# Patient Record
Sex: Female | Born: 1945 | Race: Black or African American | Hispanic: No | Marital: Married | Smoking: Never smoker
Health system: Southern US, Community
[De-identification: ages and names within clinical notes are randomized; demographics above are authoritative.]

## PROBLEM LIST (undated history)

## (undated) DIAGNOSIS — N281 Cyst of kidney, acquired: Secondary | ICD-10-CM

## (undated) DIAGNOSIS — D72819 Decreased white blood cell count, unspecified: Secondary | ICD-10-CM

## (undated) DIAGNOSIS — B009 Herpesviral infection, unspecified: Secondary | ICD-10-CM

## (undated) DIAGNOSIS — N952 Postmenopausal atrophic vaginitis: Secondary | ICD-10-CM

## (undated) DIAGNOSIS — B379 Candidiasis, unspecified: Secondary | ICD-10-CM

## (undated) DIAGNOSIS — Z87898 Personal history of other specified conditions: Secondary | ICD-10-CM

## (undated) DIAGNOSIS — H3530 Unspecified macular degeneration: Secondary | ICD-10-CM

## (undated) DIAGNOSIS — N951 Menopausal and female climacteric states: Secondary | ICD-10-CM

## (undated) DIAGNOSIS — M75100 Unspecified rotator cuff tear or rupture of unspecified shoulder, not specified as traumatic: Secondary | ICD-10-CM

## (undated) DIAGNOSIS — Z8619 Personal history of other infectious and parasitic diseases: Secondary | ICD-10-CM

## (undated) DIAGNOSIS — L309 Dermatitis, unspecified: Secondary | ICD-10-CM

## (undated) DIAGNOSIS — D1803 Hemangioma of intra-abdominal structures: Secondary | ICD-10-CM

## (undated) DIAGNOSIS — A0472 Enterocolitis due to Clostridium difficile, not specified as recurrent: Secondary | ICD-10-CM

## (undated) DIAGNOSIS — Z8739 Personal history of other diseases of the musculoskeletal system and connective tissue: Secondary | ICD-10-CM

## (undated) HISTORY — DX: Enterocolitis due to Clostridium difficile, not specified as recurrent: A04.72

## (undated) HISTORY — DX: Candidiasis, unspecified: B37.9

## (undated) HISTORY — DX: Cyst of kidney, acquired: N28.1

## (undated) HISTORY — DX: Personal history of other infectious and parasitic diseases: Z86.19

## (undated) HISTORY — DX: Dermatitis, unspecified: L30.9

## (undated) HISTORY — DX: Postmenopausal atrophic vaginitis: N95.2

## (undated) HISTORY — DX: Unspecified rotator cuff tear or rupture of unspecified shoulder, not specified as traumatic: M75.100

## (undated) HISTORY — DX: Personal history of other diseases of the musculoskeletal system and connective tissue: Z87.39

## (undated) HISTORY — DX: Unspecified macular degeneration: H35.30

## (undated) HISTORY — DX: Menopausal and female climacteric states: N95.1

## (undated) HISTORY — DX: Herpesviral infection, unspecified: B00.9

## (undated) HISTORY — DX: Decreased white blood cell count, unspecified: D72.819

## (undated) HISTORY — DX: Hemangioma of intra-abdominal structures: D18.03

## (undated) HISTORY — DX: Personal history of other specified conditions: Z87.898

## (undated) HISTORY — PX: WISDOM TOOTH EXTRACTION: SHX21

---

## 2003-09-28 ENCOUNTER — Other Ambulatory Visit: Admission: RE | Admit: 2003-09-28 | Discharge: 2003-09-28 | Payer: Self-pay | Admitting: Obstetrics and Gynecology

## 2003-11-07 ENCOUNTER — Ambulatory Visit (HOSPITAL_COMMUNITY): Admission: RE | Admit: 2003-11-07 | Discharge: 2003-11-07 | Payer: Self-pay | Admitting: Obstetrics and Gynecology

## 2004-01-09 DIAGNOSIS — Z8739 Personal history of other diseases of the musculoskeletal system and connective tissue: Secondary | ICD-10-CM

## 2004-01-09 HISTORY — DX: Personal history of other diseases of the musculoskeletal system and connective tissue: Z87.39

## 2004-07-09 ENCOUNTER — Ambulatory Visit: Payer: Self-pay | Admitting: Internal Medicine

## 2004-09-25 ENCOUNTER — Emergency Department (HOSPITAL_COMMUNITY): Admission: EM | Admit: 2004-09-25 | Discharge: 2004-09-25 | Payer: Self-pay | Admitting: Emergency Medicine

## 2004-09-27 ENCOUNTER — Ambulatory Visit: Payer: Self-pay | Admitting: Cardiovascular Disease

## 2004-09-27 ENCOUNTER — Ambulatory Visit: Payer: Self-pay | Admitting: Endocrinology

## 2004-10-03 ENCOUNTER — Ambulatory Visit: Payer: Self-pay | Admitting: Internal Medicine

## 2004-11-07 ENCOUNTER — Other Ambulatory Visit: Admission: RE | Admit: 2004-11-07 | Discharge: 2004-11-07 | Payer: Self-pay | Admitting: Obstetrics and Gynecology

## 2004-11-13 ENCOUNTER — Encounter: Admission: RE | Admit: 2004-11-13 | Discharge: 2004-11-13 | Payer: Self-pay | Admitting: Obstetrics and Gynecology

## 2004-12-03 ENCOUNTER — Ambulatory Visit: Payer: Self-pay | Admitting: Internal Medicine

## 2004-12-24 ENCOUNTER — Ambulatory Visit: Payer: Self-pay | Admitting: Internal Medicine

## 2004-12-31 ENCOUNTER — Ambulatory Visit: Payer: Self-pay | Admitting: Internal Medicine

## 2005-02-10 ENCOUNTER — Ambulatory Visit: Payer: Self-pay | Admitting: Internal Medicine

## 2005-11-04 ENCOUNTER — Ambulatory Visit: Payer: Self-pay | Admitting: Internal Medicine

## 2005-11-14 ENCOUNTER — Ambulatory Visit: Payer: Self-pay | Admitting: Internal Medicine

## 2005-11-14 ENCOUNTER — Ambulatory Visit: Payer: Self-pay | Admitting: Family Medicine

## 2005-11-17 ENCOUNTER — Encounter: Admission: RE | Admit: 2005-11-17 | Discharge: 2005-11-17 | Payer: Self-pay | Admitting: Obstetrics and Gynecology

## 2005-11-21 ENCOUNTER — Encounter: Admission: RE | Admit: 2005-11-21 | Discharge: 2005-11-21 | Payer: Self-pay | Admitting: Obstetrics and Gynecology

## 2005-12-10 ENCOUNTER — Other Ambulatory Visit: Admission: RE | Admit: 2005-12-10 | Discharge: 2005-12-10 | Payer: Self-pay | Admitting: Obstetrics and Gynecology

## 2006-04-20 ENCOUNTER — Ambulatory Visit: Payer: Self-pay | Admitting: Internal Medicine

## 2006-05-12 DIAGNOSIS — Z8739 Personal history of other diseases of the musculoskeletal system and connective tissue: Secondary | ICD-10-CM

## 2006-05-12 HISTORY — DX: Personal history of other diseases of the musculoskeletal system and connective tissue: Z87.39

## 2006-05-21 ENCOUNTER — Encounter: Admission: RE | Admit: 2006-05-21 | Discharge: 2006-05-21 | Payer: Self-pay | Admitting: Obstetrics and Gynecology

## 2006-12-02 ENCOUNTER — Encounter: Admission: RE | Admit: 2006-12-02 | Discharge: 2006-12-02 | Payer: Self-pay | Admitting: Internal Medicine

## 2006-12-31 ENCOUNTER — Ambulatory Visit: Payer: Self-pay | Admitting: Internal Medicine

## 2006-12-31 LAB — CONVERTED CEMR LAB
ALT: 20 units/L (ref 0–35)
Alkaline Phosphatase: 38 units/L — ABNORMAL LOW (ref 39–117)
BUN: 15 mg/dL (ref 6–23)
Basophils Relative: 0.8 % (ref 0.0–1.0)
Bilirubin, Direct: 0.1 mg/dL (ref 0.0–0.3)
CO2: 31 meq/L (ref 19–32)
Creatinine, Ser: 0.9 mg/dL (ref 0.4–1.2)
Eosinophils Relative: 1.5 % (ref 0.0–5.0)
GFR calc Af Amer: 82 mL/min
Glucose, Bld: 94 mg/dL (ref 70–99)
HCT: 39.5 % (ref 36.0–46.0)
HDL: 85.4 mg/dL (ref >39.0)
Hemoglobin: 13.7 g/dL (ref 12.0–15.0)
Leukocytes, UA: NEGATIVE
Lymphocytes Relative: 23.5 % (ref 12.0–46.0)
Monocytes Absolute: 0.3 10*3/uL (ref 0.2–0.7)
Neutro Abs: 2.4 10*3/uL (ref 1.4–7.7)
Nitrite: NEGATIVE
Potassium: 3.7 meq/L (ref 3.5–5.1)
RDW: 12.3 % (ref 11.5–14.6)
Specific Gravity, Urine: 1.025 (ref 1.000–1.03)
TSH: 1.1 microintl units/mL (ref 0.35–5.50)
Total Bilirubin: 1.1 mg/dL (ref 0.3–1.2)
Total Protein, Urine: NEGATIVE mg/dL
Total Protein: 6.7 g/dL (ref 6.0–8.3)
Urobilinogen, UA: 0.2 (ref 0.0–1.0)
VLDL: 4 mg/dL (ref 0–40)
WBC: 3.6 10*3/uL — ABNORMAL LOW (ref 4.5–10.5)
pH: 6 (ref 5.0–8.0)

## 2007-01-04 ENCOUNTER — Ambulatory Visit: Payer: Self-pay | Admitting: Internal Medicine

## 2007-01-25 ENCOUNTER — Ambulatory Visit: Payer: Self-pay | Admitting: Internal Medicine

## 2007-01-25 ENCOUNTER — Encounter: Payer: Self-pay | Admitting: Internal Medicine

## 2007-01-25 DIAGNOSIS — B009 Herpesviral infection, unspecified: Secondary | ICD-10-CM | POA: Insufficient documentation

## 2007-05-11 ENCOUNTER — Ambulatory Visit: Payer: Self-pay | Admitting: Internal Medicine

## 2007-05-11 DIAGNOSIS — R5381 Other malaise: Secondary | ICD-10-CM

## 2007-05-11 DIAGNOSIS — R5383 Other fatigue: Secondary | ICD-10-CM

## 2007-05-12 DIAGNOSIS — J309 Allergic rhinitis, unspecified: Secondary | ICD-10-CM | POA: Insufficient documentation

## 2007-06-21 ENCOUNTER — Encounter: Admission: RE | Admit: 2007-06-21 | Discharge: 2007-06-21 | Payer: Self-pay | Admitting: Obstetrics and Gynecology

## 2007-12-03 ENCOUNTER — Encounter: Payer: Self-pay | Admitting: Internal Medicine

## 2007-12-03 ENCOUNTER — Encounter: Admission: RE | Admit: 2007-12-03 | Discharge: 2007-12-03 | Payer: Self-pay | Admitting: Obstetrics and Gynecology

## 2007-12-22 ENCOUNTER — Encounter: Payer: Self-pay | Admitting: Internal Medicine

## 2007-12-22 ENCOUNTER — Ambulatory Visit: Payer: Self-pay | Admitting: Internal Medicine

## 2008-02-02 ENCOUNTER — Ambulatory Visit: Payer: Self-pay | Admitting: Internal Medicine

## 2008-02-02 LAB — CONVERTED CEMR LAB
ALT: 21 units/L (ref 0–35)
AST: 26 units/L (ref 0–37)
BUN: 16 mg/dL (ref 6–23)
Basophils Absolute: 0 10*3/uL (ref 0.0–0.1)
Basophils Relative: 0.4 % (ref 0.0–3.0)
CO2: 31 meq/L (ref 19–32)
Chloride: 112 meq/L (ref 96–112)
Cholesterol: 207 mg/dL (ref 0–200)
Creatinine, Ser: 0.9 mg/dL (ref 0.4–1.2)
Direct LDL: 109.6 mg/dL
Eosinophils Relative: 2.1 % (ref 0.0–5.0)
Glucose, Bld: 84 mg/dL (ref 70–99)
Hemoglobin: 13.3 g/dL (ref 12.0–15.0)
Leukocytes, UA: NEGATIVE
Lymphocytes Relative: 21.7 % (ref 12.0–46.0)
Monocytes Relative: 7.1 % (ref 3.0–12.0)
Neutro Abs: 2.6 10*3/uL (ref 1.4–7.7)
Neutrophils Relative %: 68.7 % (ref 43.0–77.0)
RBC: 4.09 M/uL (ref 3.87–5.11)
Specific Gravity, Urine: 1.025 (ref 1.000–1.03)
Total Bilirubin: 1.2 mg/dL (ref 0.3–1.2)
Total CHOL/HDL Ratio: 2.5
Total Protein: 6.8 g/dL (ref 6.0–8.3)
Urobilinogen, UA: 0.2 (ref 0.0–1.0)
VLDL: 5 mg/dL (ref 0–40)
WBC: 3.8 10*3/uL — ABNORMAL LOW (ref 4.5–10.5)
pH: 5.5 (ref 5.0–8.0)

## 2008-02-10 ENCOUNTER — Ambulatory Visit: Payer: Self-pay | Admitting: Internal Medicine

## 2008-02-10 DIAGNOSIS — R209 Unspecified disturbances of skin sensation: Secondary | ICD-10-CM | POA: Insufficient documentation

## 2008-03-15 ENCOUNTER — Telehealth: Payer: Self-pay | Admitting: Internal Medicine

## 2008-03-16 ENCOUNTER — Ambulatory Visit: Payer: Self-pay | Admitting: Internal Medicine

## 2008-03-16 DIAGNOSIS — M549 Dorsalgia, unspecified: Secondary | ICD-10-CM | POA: Insufficient documentation

## 2008-06-05 ENCOUNTER — Ambulatory Visit: Payer: Self-pay | Admitting: Internal Medicine

## 2008-06-05 ENCOUNTER — Telehealth (INDEPENDENT_AMBULATORY_CARE_PROVIDER_SITE_OTHER): Payer: Self-pay | Admitting: *Deleted

## 2008-08-23 ENCOUNTER — Telehealth (INDEPENDENT_AMBULATORY_CARE_PROVIDER_SITE_OTHER): Payer: Self-pay | Admitting: *Deleted

## 2008-08-25 ENCOUNTER — Ambulatory Visit: Payer: Self-pay | Admitting: Internal Medicine

## 2008-08-25 DIAGNOSIS — M533 Sacrococcygeal disorders, not elsewhere classified: Secondary | ICD-10-CM

## 2008-08-28 ENCOUNTER — Ambulatory Visit: Payer: Self-pay | Admitting: Internal Medicine

## 2008-08-29 ENCOUNTER — Telehealth: Payer: Self-pay | Admitting: Internal Medicine

## 2008-12-04 ENCOUNTER — Encounter: Admission: RE | Admit: 2008-12-04 | Discharge: 2008-12-04 | Payer: Self-pay | Admitting: Internal Medicine

## 2008-12-22 ENCOUNTER — Ambulatory Visit: Payer: Self-pay | Admitting: Internal Medicine

## 2009-01-11 LAB — CONVERTED CEMR LAB: Pap Smear: NORMAL

## 2009-01-29 ENCOUNTER — Telehealth (INDEPENDENT_AMBULATORY_CARE_PROVIDER_SITE_OTHER): Payer: Self-pay | Admitting: *Deleted

## 2009-02-09 ENCOUNTER — Ambulatory Visit: Payer: Self-pay | Admitting: Internal Medicine

## 2009-02-09 LAB — CONVERTED CEMR LAB
AST: 23 units/L (ref 0–37)
Albumin: 4.2 g/dL (ref 3.5–5.2)
Alkaline Phosphatase: 32 units/L — ABNORMAL LOW (ref 39–117)
BUN: 18 mg/dL (ref 6–23)
Basophils Relative: 0.6 % (ref 0.0–3.0)
Bilirubin Urine: NEGATIVE
Creatinine, Ser: 0.9 mg/dL (ref 0.4–1.2)
GFR calc non Af Amer: 80.95 mL/min (ref >60)
Glucose, Bld: 87 mg/dL (ref 70–99)
HCT: 37.9 % (ref 36.0–46.0)
Hemoglobin, Urine: NEGATIVE
Hemoglobin: 12.9 g/dL (ref 12.0–15.0)
Ketones, ur: NEGATIVE mg/dL
Lymphocytes Relative: 24.1 % (ref 12.0–46.0)
MCHC: 34 g/dL (ref 30.0–36.0)
Monocytes Relative: 6.7 % (ref 3.0–12.0)
Neutro Abs: 2.4 10*3/uL (ref 1.4–7.7)
Potassium: 3.7 meq/L (ref 3.5–5.1)
RBC: 4.03 M/uL (ref 3.87–5.11)
Total CHOL/HDL Ratio: 3
Total Protein: 6.6 g/dL (ref 6.0–8.3)
Urine Glucose: NEGATIVE mg/dL
Urobilinogen, UA: 0.2 (ref 0.0–1.0)
VLDL: 4.4 mg/dL (ref 0.0–40.0)

## 2009-02-15 ENCOUNTER — Ambulatory Visit: Payer: Self-pay | Admitting: Internal Medicine

## 2009-02-15 DIAGNOSIS — M25519 Pain in unspecified shoulder: Secondary | ICD-10-CM

## 2009-02-16 ENCOUNTER — Telehealth: Payer: Self-pay | Admitting: Internal Medicine

## 2009-02-26 ENCOUNTER — Telehealth (INDEPENDENT_AMBULATORY_CARE_PROVIDER_SITE_OTHER): Payer: Self-pay | Admitting: *Deleted

## 2009-03-02 ENCOUNTER — Encounter: Admission: RE | Admit: 2009-03-02 | Discharge: 2009-03-02 | Payer: Self-pay | Admitting: Internal Medicine

## 2009-03-05 ENCOUNTER — Encounter: Payer: Self-pay | Admitting: Internal Medicine

## 2009-04-25 ENCOUNTER — Telehealth: Payer: Self-pay | Admitting: Internal Medicine

## 2009-07-16 ENCOUNTER — Encounter: Payer: Self-pay | Admitting: Internal Medicine

## 2009-08-10 HISTORY — PX: ROTATOR CUFF REPAIR: SHX139

## 2009-08-21 ENCOUNTER — Encounter: Payer: Self-pay | Admitting: Internal Medicine

## 2009-09-25 ENCOUNTER — Ambulatory Visit: Payer: Self-pay | Admitting: Internal Medicine

## 2009-10-24 ENCOUNTER — Telehealth: Payer: Self-pay | Admitting: Internal Medicine

## 2009-11-20 ENCOUNTER — Ambulatory Visit: Payer: Self-pay | Admitting: Internal Medicine

## 2009-11-29 ENCOUNTER — Encounter: Payer: Self-pay | Admitting: Internal Medicine

## 2009-11-29 DIAGNOSIS — I739 Peripheral vascular disease, unspecified: Secondary | ICD-10-CM

## 2009-11-30 ENCOUNTER — Encounter: Payer: Self-pay | Admitting: Internal Medicine

## 2009-11-30 ENCOUNTER — Ambulatory Visit: Payer: Self-pay

## 2009-12-06 ENCOUNTER — Encounter: Admission: RE | Admit: 2009-12-06 | Discharge: 2009-12-06 | Payer: Self-pay | Admitting: Internal Medicine

## 2009-12-06 LAB — HM MAMMOGRAPHY: HM Mammogram: NEGATIVE

## 2010-01-08 ENCOUNTER — Encounter: Payer: Self-pay | Admitting: Internal Medicine

## 2010-01-09 ENCOUNTER — Telehealth: Payer: Self-pay | Admitting: Internal Medicine

## 2010-01-10 ENCOUNTER — Ambulatory Visit: Payer: Self-pay | Admitting: Internal Medicine

## 2010-01-10 DIAGNOSIS — Z8669 Personal history of other diseases of the nervous system and sense organs: Secondary | ICD-10-CM | POA: Insufficient documentation

## 2010-02-09 DIAGNOSIS — H3530 Unspecified macular degeneration: Secondary | ICD-10-CM

## 2010-02-09 HISTORY — DX: Unspecified macular degeneration: H35.30

## 2010-02-19 ENCOUNTER — Encounter: Payer: Self-pay | Admitting: Internal Medicine

## 2010-02-19 ENCOUNTER — Ambulatory Visit: Payer: Self-pay | Admitting: Internal Medicine

## 2010-02-19 DIAGNOSIS — M810 Age-related osteoporosis without current pathological fracture: Secondary | ICD-10-CM | POA: Insufficient documentation

## 2010-02-19 LAB — CONVERTED CEMR LAB
Alkaline Phosphatase: 48 units/L (ref 39–117)
Basophils Absolute: 0 10*3/uL (ref 0.0–0.1)
Bilirubin, Direct: 0.1 mg/dL (ref 0.0–0.3)
Calcium: 9.5 mg/dL (ref 8.4–10.5)
Chloride: 103 meq/L (ref 96–112)
Creatinine, Ser: 0.7 mg/dL (ref 0.4–1.2)
Direct LDL: 125.2 mg/dL
Eosinophils Absolute: 0.1 10*3/uL (ref 0.0–0.7)
Folate: 17.3 ng/mL
GFR calc non Af Amer: 111.51 mL/min (ref >60)
Hemoglobin: 13.8 g/dL (ref 12.0–15.0)
Hgb A1c MFr Bld: 5.6 % (ref 4.6–6.5)
Lymphocytes Relative: 21.7 % (ref 12.0–46.0)
Lymphs Abs: 0.8 10*3/uL (ref 0.7–4.0)
MCHC: 34.2 g/dL (ref 30.0–36.0)
Monocytes Absolute: 0.2 10*3/uL (ref 0.1–1.0)
Neutro Abs: 2.5 10*3/uL (ref 1.4–7.7)
RDW: 13.3 % (ref 11.5–14.6)
Saturation Ratios: 17.6 % — ABNORMAL LOW (ref 20.0–50.0)
Total Protein: 6.9 g/dL (ref 6.0–8.3)
Transferrin: 296 mg/dL (ref 212.0–360.0)
VLDL: 5.6 mg/dL (ref 0.0–40.0)
Vitamin B-12: 1328 pg/mL — ABNORMAL HIGH (ref 211–911)

## 2010-02-21 ENCOUNTER — Ambulatory Visit: Payer: Self-pay | Admitting: Internal Medicine

## 2010-02-21 ENCOUNTER — Telehealth: Payer: Self-pay | Admitting: Internal Medicine

## 2010-02-21 ENCOUNTER — Encounter: Payer: Self-pay | Admitting: Internal Medicine

## 2010-02-25 ENCOUNTER — Ambulatory Visit (HOSPITAL_COMMUNITY)
Admission: RE | Admit: 2010-02-25 | Discharge: 2010-02-25 | Payer: Self-pay | Source: Home / Self Care | Admitting: Internal Medicine

## 2010-02-28 ENCOUNTER — Encounter: Payer: Self-pay | Admitting: Internal Medicine

## 2010-03-04 ENCOUNTER — Telehealth: Payer: Self-pay | Admitting: Internal Medicine

## 2010-03-11 ENCOUNTER — Encounter: Payer: Self-pay | Admitting: Internal Medicine

## 2010-03-18 ENCOUNTER — Encounter: Payer: Self-pay | Admitting: Internal Medicine

## 2010-03-28 ENCOUNTER — Telehealth: Payer: Self-pay | Admitting: Internal Medicine

## 2010-05-31 ENCOUNTER — Encounter: Payer: Self-pay | Admitting: Internal Medicine

## 2010-06-02 ENCOUNTER — Encounter: Payer: Self-pay | Admitting: Obstetrics and Gynecology

## 2010-06-02 ENCOUNTER — Encounter: Payer: Self-pay | Admitting: Internal Medicine

## 2010-06-13 NOTE — Assessment & Plan Note (Signed)
Summary: feels off balance / SD   Vital Signs:  Patient profile:   65 year old female Height:      66 inches Weight:      134 pounds BMI:     21.71 O2 Sat:      99 % on Room air Temp:     97.9 degrees F oral Pulse rate:   69 / minute BP sitting:   124 / 72  (right arm) Cuff size:   regular  Vitals Entered By: Bill Salinas CMA (Sep 25, 2009 2:04 PM)  O2 Flow:  Room air CC: pt here with c/o feeling off balence since yesterday, she is currently on oxycodone prn from a rotator cuff repair back in april / ab   Primary Care Provider:  Len Azeez  CC:  pt here with c/o feeling off balence since yesterday and she is currently on oxycodone prn from a rotator cuff repair back in april / ab.  History of Present Illness: Still recovering from left shoulder surgery and is in rehab. She is still having pain. She does take aleve and oxycodone/APAP prior to PT. Yesterday after PT she was unsteady on her feet. She had taken a doses of oxycodone prior to PT. She continued to be dizzy into the evening. This AM she was still not feeling normal/dizzy but not ataxic. 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 had recurrent ataxia and dizziness. Last week she took oxycodone at higher dose without dysequilibrium   Current Medications (verified): 1)  Alendronate Sodium 70 Mg  Tabs (Alendronate Sodium) .... One Tablet By Mouth On The Same Day Weekly W/  A Full Glass of Water. 2)  Activella .... Take One Tablet By Mouth Every Other Day 3)  Kyolic .... 2 Two Times A Day 4)  Oscal 500/200 D-3 500-200 Mg-Unit  Tabs (Calcium-Vitamin D) .... Take 1 Tablet By Mouth Two Times A Day 5)  Multivitamins   Tabs (Multiple Vitamin) .... Take 1 Tablet By Mouth Once A Day 6)  Hydrocortisone Valerate 0.2 % Crea (Hydrocortisone Valerate) .... Use As Needed For Facial Rash. 7)  Aleve 220 Mg Tabs (Naproxen Sodium) .[name].. 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    Physical Exam  General:  overweight AA female in no distress Head:  Normocephalic and atraumatic without obvious abnormalities. No apparent alopecia or balding. Lungs:  normal respiratory effort and normal breath sounds.   Heart:  normal rate and regular rhythm.   Msk:  decreased ROM left shoulder Neurologic:  alert & oriented X3, cranial nerves II-XII intact, strength normal in all extremities, gait normal, DTRs symmetrical and normal, and finger-to-nose normal, no dysdiadochokinesia. Skin:  turgor normal and color normal.   Psych:  Oriented X3, normally interactive, and good eye contact.     Impression & Recommendations:  Problem # 1:  LABYRINTHITIS, ACUTE (ICD-386.30) patient with dysequilibrium. Her neuro exam is normal mitigating against CNS event.  Plan - education and reasurance           otc bonine 1/2 or 1 by mouth  q 6 as needed dizziness.  Problem # 2:  SHOULDER PAIN, LEFT (ICD-719.41) s/p rotator cuff repair   Plan - continue PT and follow-up with Dr. [name]  Her updated medication list for this problem includes:    Aleve 220 Mg Tabs (Naproxen sodium) .[name]... 1 to 2 as needed  Complete Medication List: 1)  Alendronate Sodium 70 Mg Tabs (Alendronate sodium) .... One tablet by mouth on the same day weekly w/  a full glass of water. 2)  Activella  .... Take one tablet by mouth every other day 3)  Kyolic  .... 2 two times a day 4)  Oscal 500/200 D-3 500-200 Mg-unit Tabs (Calcium-vitamin d) .... Take 1 tablet by mouth two  times a day 5)  Multivitamins Tabs (Multiple vitamin) .... Take 1 tablet by mouth once a day 6)  Hydrocortisone Valerate 0.2 % Crea (Hydrocortisone valerate) .... Use as needed for facial rash. 7)  Aleve 220 Mg Tabs (Naproxen sodium) .[name].. 1 to 2 as needed

## 2010-06-13 NOTE — Letter (Signed)
   [facility] [address] [place], [place]  [address] Phone: [phone]      February 28, 2010   [facility] [place] [address] [place], [place] [address]  RE:  LAB RESULTS  Dear  Ms. NEUBERGER,  The following is an interpretation of your most recent lab tests.  Please take note of any instructions provided or changes to medications that have resulted from your lab work.    MRI/MRA of brain negative for any tumor or vascular disease that could cause dysequilibrium.  Working diagnosis remains labyrinthitis.   Please come see me if you have any questions about these lab results.   Sincerely Yours,    Jacques Navy MD  R BRAIN W/O CM [phone]   Clinical Data:  Gait disturbance.  Labyrinthitis.   MRI HEAD WITHOUT CONTRAST MRA HEAD WITHOUT CONTRAST   Technique:  Multiplanar, multiecho pulse sequences of the brain and surrounding structures were obtained without intravenous contrast. Angiographic images of the head were obtained using MRA technique without contrast.   Comparison:  CT 09/27/2004   MRI HEAD   Findings:  There are numerous small subcortical and deep white matter hyperintensities bilaterally.  Brainstem is normal.  Small hyperintensities in the cerebellar white matter bilaterally. Diffusion weighted imaging is negative for acute infarct.   Negative for hydrocephalus.  Negative for Chiari malformation. Pituitary gland is normal in size.   Negative for intracranial mass.  There is no hemorrhage or fluid collection.   Labyrinthitis would not be typically detected on unenhanced MRI. It  may be detected with intravenous contrast.  No mastoid sinus effusion or lesion is identified.   IMPRESSION: Moderate changes throughout the cerebral and cerebellar white matter bilaterally.  This is most likely due to chronic microvascular ischemia.  Vasculitis, migraine headaches, and demyelinating disease are also possible.  No acute  infarct or mass.   MRA HEAD   Findings: Both vertebral arteries are patent to the basilar.  PICA, AICA, superior cerebellar, and posterior cerebral arteries are widely patent without stenosis.   Internal carotid artery is widely patent.  Anterior and middle cerebral arteries are patent bilaterally without stenosis.   Negative for cerebral aneurysm.   IMPRESSION: Negative   Read By:  Camelia Phenes,  Judie Petit.D.

## 2010-06-13 NOTE — Progress Notes (Signed)
Summary: CALL FROM MD  Phone Note Call from Patient Call back at Home Phone [phone] Call back at or [phone]   Summary of Call: Patient is requesting a call back from Dr Debby Bud to discuss the MRI.  Initial call taken by: Lamar Sprinkles, CMA,  March 04, 2010 4:33 PM  Follow-up for Phone Call        called pt: MRI with chronic small vessel dz but otherwise normal. MRA normal. She is still having intermittent dysequilibrium. Will refer to neurology. [facility] notified.  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 Follow-up by: Jacques Navy MD,  March 04, 2010 5:50 PM

## 2010-06-13 NOTE — Progress Notes (Signed)
Summary: REQ A CALL FROM MD  Phone Note Call from Patient Call back at Home Phone [phone]   Summary of Call: Patient is requesting a call from Dr Debby Bud. She wants MD to go over her labs. Pt did recieve the ov notes but does not understand some of the information.  She would not give me any specific questions.   - This is the first time she has had to have labs after her office visit. Initial call taken by: Lamar Sprinkles, CMA,  February 21, 2010 4:11 PM  Follow-up for Phone Call        called patient reviewed labs amd note Follow-up by: Jacques Navy MD,  February 21, 2010 6:19 PM

## 2010-06-13 NOTE — Miscellaneous (Signed)
Summary: BONE DENSITY  Clinical Lists Changes  Orders: Added new Test order of T-Bone Densitometry (77080) - Signed Added new Test order of T-Lumbar Vertebral Assessment (77082) - Signed 

## 2010-06-13 NOTE — Miscellaneous (Signed)
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 

## 2010-06-13 NOTE — Progress Notes (Signed)
  Phone Note Outgoing Call   Reason for Call: Discuss lab or test results Summary of Call: please call patient - her Vit d is 33 - very very good.  Thanks Initial call taken by: Jacques Navy MD,  March 28, 2010 8:16 AM  Follow-up for Phone Call        informed pt and will fax copy to Dr Pennie Rushing Follow-up by: Ami Bullins CMA,  March 28, 2010 9:49 AM

## 2010-06-13 NOTE — Progress Notes (Signed)
Summary: REQ FOR A CALL  Phone Note Call from Patient   Summary of Call: Pt has recurrence of "equilibrium" problem that she was seen for at last office visit. She says it is not as severe this time. Patient is requesting a call from MD. Would you like office visit?  Initial call taken by: Lamar Sprinkles, CMA,  January 09, 2010 1:34 PM  Follow-up for Phone Call        yes, OV for reevaluation Follow-up by: Jacques Navy MD,  January 09, 2010 1:39 PM  Additional Follow-up for Phone Call Additional follow up Details #1::        Pt scheduled for office visit tomorrow am for eval Additional Follow-up by: Lamar Sprinkles, CMA,  January 09, 2010 1:52 PM

## 2010-06-13 NOTE — Letter (Signed)
Summary: [facility]  [facility]   Imported By: Sherian Rein 06/07/2010 09:25:23  _____________________________________________________________________  External Attachment:    Type:   Image     Comment:   External Document

## 2010-06-13 NOTE — Assessment & Plan Note (Signed)
Summary: cpx-lb   Vital Signs:  Patient profile:   65 year old female Height:      66 inches (167.64 cm) Weight:      135.50 pounds (61.59 kg) BMI:     21.95 O2 Sat:      99 % on Room air Temp:     97.5 degrees F (36.39 degrees C) oral Pulse rate:   78 / minute BP sitting:   114 / 72  (left arm) Cuff size:   regular  Vitals Entered By: Wendy Grills MA (February 19, 2010 10:24 AM)  O2 Flow:  Room air CC: CPX/pt would like Vitamin D test per OB/GYN/aj Is Patient Diabetic? No Comments pt is no longer taking Alendronate Sodium   Primary Care Provider:  norins  CC:  CPX/pt would like Vitamin D test per OB/GYN/aj.  History of Present Illness: Patient presents for Welcome to Medicare exam. She has been doing well and has continued to make progress with her left shoulder recovery. She is current with Dr. Dierdre Forth. She is generally feeling well. She is still having some dizziness that was diagnosed as labyrinthitis. She has not had any neuro-imaging.  Preventive Screening-Counseling & Management  Alcohol-Tobacco     Alcohol drinks/day: 0     Smoking Status: never  Caffeine-Diet-Exercise     Caffeine use/day: tea in cold weather     Diet Comments: heart healthy diet     Diet Counseling: not indicated; diet is assessed to be healthy     Does Patient Exercise: yes     Type of exercise: stair climbing and shoulder exercis     Exercise (avg: min/session): <30     Times/week: 5     Exercise Counseling: to improve exercise regimen     MSH Depression Score: no depression  Hep-HIV-STD-Contraception     Hepatitis Risk: no risk noted     HIV Risk: no risk noted     STD Risk: no risk noted     Dental Visit-last 6 months yes     SBE monthly: no     Sun Exposure-Excessive: no  Safety-Violence-Falls     Seat Belt Use: yes     Firearms in the Home: no firearms in the home     Smoke Detectors: yes     Violence in the Home: no risk noted     Sexual Abuse: no     Fall Risk:  minor due to shoulder     Fall Risk Counseling: counseling provided; falls with injury noted      Sexual History:  currently monogamous.        Drug Use:  never.        Blood Transfusions:  no.    Current Medications (verified): 1)  Activella .... Take One Tablet By Mouth Every Other Day 2)  Kyolic .... 2 Two Times A Day 3)  Oscal 500/200 D-3 500-200 Mg-Unit  Tabs (Calcium-Vitamin D) .... Take 1 Tablet By Mouth Two Times A Day 4)  Multivitamins   Tabs (Multiple Vitamin) .... Take 1 Tablet By Mouth Once A Day 5)  Hydrocortisone Valerate 0.2 % Crea (Hydrocortisone Valerate) .... Use As Needed For Facial Rash.  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 Pennie Rushing              ortho - Dr. [name]              opthal - Dr. Pearlean Brownie ([facility])                Social History: [employer] Serr[employer] college BA; MEd-teaching; MEd Administration @ [employer] married '67 has 11 children who were foster care or mentored. Retired. SO retired. Has had some health problems. They are doing well. Marriage is in good health and retirement is good.  End-of-Life: provided packet of information and forms for consideration (Oct '11) Caffeine use/day:  tea in cold weather Hepatitis Risk:  no risk noted HIV Risk:  no risk noted STD Risk:  no risk noted Dental Care w/in 6 mos.:  yes Sun Exposure-Excessive:  no Seat Belt Use:  yes Fall Risk:  minor due to shoulder Sexual History:  currently monogamous Drug Use:  never Blood  Transfusions:  no  Review of Systems  The patient denies anorexia, fever, weight loss, weight gain, vision loss, decreased hearing, chest pain, dyspnea on exertion, peripheral edema, prolonged cough, headaches, abdominal pain, melena, hematochezia, severe indigestion/heartburn, hematuria, incontinence, muscle weakness, suspicious skin lesions, difficulty walking, depression, unusual weight change, abnormal bleeding, enlarged lymph nodes, angioedema, and breast masses.    Physical Exam  General:  Slender, well nourished and well groomed AA woma in no distress Head:  Normocephalic and atraumatic without obvious abnormalities. No apparent alopecia or balding. Eyes:  No corneal or conjunctival inflammation noted. EOMI. Perrla. Funduscopic exam benign, without hemorrhages, exudates or papilledema. Vision grossly normal. Ears:  External ear exam shows no significant lesions or deformities.  Otoscopic examination reveals clear canals, tympanic membranes are intact bilaterally without bulging, retraction, inflammation or discharge. Hearing is grossly normal bilaterally. Nose:  no external deformity and no external erythema.   Mouth:  Oral mucosa and oropharynx without lesions or exudates.  Teeth in good repair. Neck:  supple, full ROM, no thyromegaly, and no carotid bruits.   Chest Wall:  no deformities.   Breasts:  deferred to gyn Lungs:  Normal respiratory effort, chest expands symmetrically. Lungs are clear to auscultation, no crackles or wheezes. Heart:  Normal rate and regular rhythm. S1 and S2 normal without gallop, murmur, click, rub or other extra sounds. Abdomen:  soft, non-tender, normal bowel sounds, and no hepatomegaly.   Genitalia:  deferred to gyn Msk:  no joint tenderness, no joint swelling, no joint warmth, and no redness over joints.  ROM left shoulder not formerly evaluated Pulses:  2+ radial, posterior tibial and dorsalis pedis pulses Extremities:  No clubbing, cyanosis, edema, or  deformity noted with normal full range of motion of all joints.   Neurologic:  alert & oriented X3, cranial nerves II-XII intact, strength normal in all extremities, gait normal, and DTRs symmetrical and normal.   Skin:  turgor normal, color normal, no rashes, and no suspicious lesions.  Multiple small skin tags along necklace line Cervical Nodes:  no anterior cervical adenopathy and no posterior cervical adenopathy.   Psych:  Oriented X3, memory intact for recent and remote, normally interactive, good eye contact, and not anxious appearing.     Impression & Recommendations:  Problem # 1:  OSTEOPOROSIS (ICD-733.00) Due  for follow-up DXA scan with last study in August of '09. Will also check Vit D  The following medications were removed from the medication list:    Alendronate Sodium 70 Mg Tabs (Alendronate sodium) ..... One tablet by mouth on the same day weekly w/  a full glass of water.  Orders: T-Vitamin D (25-Hydroxy) [phone])  Addendum -   Problem # 2:  LABYRINTHITIS (ICD-386.30) Patient with persistent symptoms that are intermittent but present for many weeks.  Plan - r/o CNS abnormality, e.g. cerebellar or brainstem lacunar infarct or vertebro-basilar insufficiency, with MRI/MRA  Orders: Radiology Referral (Radiology)  Problem # 3:  SHOULDER PAIN, LEFT (ICD-719.41) Steady improvement with continuation of home exercise program.  Problem # 4:  OTHER MALAISE AND FATIGUE (ICD-780.79)  Symptoms have improved.  Plan - lab to verify absence of metabolic derrangement as cause of low energy.  Addendum- normal lab including B12, iron panel, A1C  Orders: TLB-TSH (Thyroid Stimulating Hormone) (84443-TSH) TLB-B12 + Folate Pnl ([id number]/NGE) TLB-IBC Pnl (Iron/FE;Transferrin) (83550-IBC) TLB-BMP (Basic Metabolic Panel-BMET) (80048-METABOL) TLB-A1C / Hgb A1C (Glycohemoglobin) (83036-A1C)  Complete Medication List: 1)  Activella  .... Take one tablet by mouth every  other day 2)  Kyolic  .... 2 two times a day 3)  Oscal 500/200 D-3 500-200 Mg-unit Tabs (Calcium-vitamin d) .... Take 1 tablet by mouth two times a day 4)  Multivitamins Tabs (Multiple vitamin) .... Take 1 tablet by mouth once a day 5)  Hydrocortisone Valerate 0.2 % Crea (Hydrocortisone valerate) .... Use as needed for facial rash.  Other Orders: TLB-Lipid Panel (80061-LIPID) TLB-Hepatic/Liver Function Pnl (80076-HEPATIC) TLB-CBC Platelet - w/Differential (85025-CBCD) Flu Vaccine 77yrs + MEDICARE PATIENTS (X5284) Administration Flu vaccine - MCR (X3244) Pneumococcal Vaccine (01027) Admin 1st Vaccine (25366) Welcome to Medicare, Physical (Y4034)   atient: Wendy Serrano Note: All result statuses are Final unless otherwise noted.  Tests: (1) TSH (TSH)   FastTSH                   1.34 uIU/mL                 0.35-5.50  Tests: (2) B12 + Folate Panel (B12/FOL)   Vitamin B12          [H]  1328 pg/mL                  211-911   Folate                    17.3 ng/mL     Deficient  0.4 - 3.4 ng/mL     Indeterminate  3.4 - 5.4 ng/mL     Normal  >5.4 ng/mL  Tests: (3) IBC Panel (IBC)   Iron                      73 ug/dL                    74-259   Transferrin               296.0 mg/dL                 563.8-756.4   Iron Saturation      [L]  17.6 %                      20.0-50.0  Tests: (4) Lipid Panel (LIPID)   Cholesterol          [  H]  237 mg/dL                   1-610     ATP III Classification            Desirable:  < 200 mg/dL                    Borderline High:  200 - 239 mg/dL               High:  > = 240 mg/dL   Triglycerides             28.0 mg/dL                  9.6-045.4     Normal:  <150 mg/dL     Borderline High:  098 - 199 mg/dL   HDL                       11.91 mg/dL                 >47.82   VLDL Cholesterol          5.6 mg/dL                   9.5-62.1  CHO/HDL Ratio:  CHD Risk                             3                    Men          Women     1/2 Average Risk      3.4          3.3     Average Risk          5.0          4.4     2X Average Risk          9.6          7.1     3X Average Risk          15.0          11.0                           Tests: (5) Hepatic/Liver Function Panel (HEPATIC)   Total Bilirubin           0.9 mg/dL                   3.0-8.6   Direct Bilirubin          0.1 mg/dL                   5.7-8.4   Alkaline Phosphatase      48 U/L                      39-117   AST                       21 U/L                      0-37   ALT  16 U/L                      0-35   Total Protein             6.9 g/dL                    0.4-5.4   Albumin                   4.4 g/dL                    0.9-8.1  Tests: (6) CBC Platelet w/Diff (CBCD)   White Cell Count     [L]  3.6 K/uL                    4.5-10.5   Red Cell Count            4.35 Mil/uL                 3.87-5.11   Hemoglobin                13.8 g/dL                   19.1-47.8   Hematocrit                40.4 %                      36.0-46.0   MCV                       92.8 fl                     78.0-100.0   MCHC                      34.2 g/dL                   29.5-62.1   RDW                       13.3 %                      11.5-14.6   Platelet Count            244.0 K/uL                  150.0-400.0   Neutrophil %              69.6 %                      43.0-77.0   Lymphocyte %              21.7 %                      12.0-46.0   Monocyte %                6.2 %                       3.0-12.0   Eosinophils%              1.7 %  0.0-5.0   Basophils %               0.8 %                       0.0-3.0   Neutrophill Absolute      2.5 K/uL                    1.4-7.7   Lymphocyte Absolute       0.8 K/uL                    0.7-4.0   Monocyte Absolute         0.2 K/uL                    0.1-1.0  Eosinophils, Absolute                             0.1 K/uL                    0.0-0.7   Basophils Absolute        0.0 K/uL                     0.0-0.1  Tests: (7) BMP (METABOL)   Sodium                    139 mEq/L                   135-145   Potassium                 3.9 mEq/L                   3.5-5.1   Chloride                  103 mEq/L                   96-112   Carbon Dioxide            29 mEq/L                    19-32   Glucose                   90 mg/dL                    08-65   BUN                       16 mg/dL                    7-84   Creatinine                0.7 mg/dL                   6.9-6.2   Calcium                   9.5 mg/dL                   9.5-28.4   GFR                       111.51 mL/min               >  60  Tests: (8) Hemoglobin A1C (A1C)   Hemoglobin A1C            5.6 %                       4.6-6.5     Glycemic Control Guidelines for People with Diabetes:     Non Diabetic:  <6%     Goal of Therapy: <7%     Additional Action Suggested:  >8%   Tests: (9) Cholesterol LDL - Direct (DIRLDL)  Cholesterol LDL - Direct                             125.2 mg/dL     Optimal:  <161 mg/dL     Near or Above Optimal:  100-129 mg/dL     Borderline High:  096-045 mg/dL     High:  409-811 mg/dL     Very High:  >914 mg/dL  Preventive Care Screening  Pap Smear:    Date:  01/10/2010    Results:  normal   Mammogram:    Date:  11/09/2009    Results:  normal     Flu Vaccine Consent Questions     Do you have a history of severe allergic reactions to this vaccine? no    Any prior history of allergic reactions to egg and/or gelatin? no    Do you have a sensitivity to the preservative Thimersol? no    Do you have a past history of Guillan-Barre Syndrome? no    Do you currently have an acute febrile illness? no    Have you ever had a severe reaction to latex? no    Vaccine information given and explained to patient? yes    Are you currently pregnant? no    Lot Number:[id number]   Exp Date:11/09/2010   Site Given  Left Deltoid IMlu   Immunizations Administered:  Pneumonia Vaccine:    Vaccine Type:  Pneumovax    Site: right deltoid    Mfr: Merck    Dose: 0.5 ml    Route: IM    Given by: Wendy Grills MA    Exp. Date: 07/29/2011    Lot #: [id number]    VIS given: 04/16/09 version given February 19, 2010.  Appended Document: cpx-lb Mrs. Otwell remains 100% independent in all her ADLs. She has no signs or symptoms of depression. She is very competent and manages a complex social calendar, engages in complex activities including the management of her home, her household and all the attendent aspects of the same.

## 2010-06-13 NOTE — Assessment & Plan Note (Signed)
Summary: balance concerns/SD   Vital Signs:  Patient profile:   65 year old female Height:      66 inches Weight:      134 pounds BMI:     21.71 O2 Sat:      99 % on Room air Temp:     97.9 degrees F oral Pulse rate:   81 / minute BP sitting:   110 / 68  (left arm) Cuff size:   regular  Vitals Entered By: Bill Salinas CMA (January 10, 2010 9:13 AM)  O2 Flow:  Room air CC: pt here to discuss ongoing balance issues/ ab   Primary Care Provider:  norins  CC:  pt here to discuss ongoing balance issues/ ab.  History of Present Illness: Patient present for recurrent dysequilibrium. She had similar symptoms but more severe in the past that responded to meclizine. she has had symptoms since Sunday. she has had no falls. she denies any focal neurologic symptoms.   Current Medications (verified): 1)  Alendronate Sodium 70 Mg  Tabs (Alendronate Sodium) .... One Tablet By Mouth On The Same Day Weekly W/  A Full Glass of Water. 2)  Activella .... Take One Tablet By Mouth Every Other Day 3)  Kyolic .... 2 Two Times A Day 4)  Oscal 500/200 D-3 500-200 Mg-Unit  Tabs (Calcium-Vitamin D) .... Take 1 Tablet By Mouth Two Times A Day 5)  Multivitamins   Tabs (Multiple Vitamin) .... Take 1 Tablet By Mouth Once A Day 6)  Hydrocortisone Valerate 0.2 % Crea (Hydrocortisone Valerate) .... Use As Needed For Facial Rash.  Allergies (verified): No Known Drug Allergies PMH-FH-SH reviewed-no changes except otherwise noted  Review of Systems  The patient denies anorexia, fever, weight loss, syncope, dyspnea on exertion, headaches, muscle weakness, difficulty walking, and enlarged lymph nodes.    Physical Exam  General:  WNWD nicely groomed AA female in no distress Head:  normocephalic and atraumatic.   Eyes:  vision grossly intact, pupils equal, and pupils round.   Lungs:  normal respiratory effort.   Heart:  normal rate and regular rhythm.   Neurologic:  alert & oriented X3, cranial nerves II-XII  intact, strength normal in all extremities, and gait normal, tandem gait normal, nl rapid alternating finger movement, no dysdiadochokinesia, nl finger- to nose. Skin:  turgor normal and color normal.   Psych:  Oriented X3 and memory intact for recent and remote.     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 Reviewed mechanism of disease with use of anatomy text.  Plan - meclizine as needed.   Problem # 2:  Preventive Health Care (ICD-V70.0) Discussed immunizations: she has had shingles - 15 years ago. reviewed CDC recommendation - no clear evidence of efficacy but no harm either with immunization; pneumonia vaccine is efficacious and usefull; flu shots are efficacious. Will wait and consider immunizations at annual exam in October.   Complete Medication List: 1)  Alendronate Sodium 70 Mg Tabs (Alendronate sodium) .... One tablet by mouth on the same day weekly w/  a full glass of water. 2)  Activella  .... Take one tablet by mouth every other day 3)  Kyolic  .... 2 two times a day 4)  Oscal 500/200 D-3 500-200 Mg-unit Tabs (Calcium-vitamin d) .... Take 1 tablet by mouth two times a day 5)  Multivitamins Tabs (Multiple vitamin) .... Take 1 tablet by mouth once a day 6)  Hydrocortisone Valerate 0.2 % Crea (Hydrocortisone valerate) .... Use as needed for facial rash.

## 2010-06-13 NOTE — Letter (Signed)
Summary: [facility]   Imported By: Sherian Rein 08/11/2009 09:20:15  _____________________________________________________________________  External Attachment:    Type:   Image     Comment:   External Document

## 2010-06-13 NOTE — Letter (Signed)
Summary: [facility]   Imported By: Sherian Rein 01/15/2010 15:12:37  _____________________________________________________________________  External Attachment:    Type:   Image     Comment:   External Document

## 2010-06-13 NOTE — Assessment & Plan Note (Signed)
Summary: STILL HAVING BALANCE PROBLEMS/NWS  #   Vital Signs:  Patient profile:   65 year old female Height:      66 inches Weight:      135 pounds BMI:     21.87 O2 Sat:      98 % on Room air Temp:     98.6 degrees F oral Pulse rate:   87 / minute BP sitting:   118 / 72  (left arm) Cuff size:   regular  Vitals Entered By: Bill Salinas CMA (November 20, 2009 3:12 PM)  O2 Flow:  Room air CC: Pt having continued problems with balence/ ab Comments PT is no longer taking Alendronate   Primary Care Provider:  Kebin Maye  CC:  Pt having continued problems with balence/ ab.  History of Present Illness: Patient presents for evaluation of leg weakness.   She has been making progress with rehab for torn rotator cuff left shoulder but is still limited in ROM. She would like to go to PT once a week and do the majority of work at home.  She reports that for several weeks she has noted weakness in her legs. She has trouble rising from a deep squat or when climbing stairs. This is a new problem. she has not had significant pain in the low back or in her legs. she has not had paresthesia, foot drop nor any recent injury.   Current Medications (verified): 1)  Alendronate Sodium 70 Mg  Tabs (Alendronate Sodium) .... One Tablet By Mouth On The Same Day Weekly W/  A Full Glass of Water. 2)  Activella .... Take One Tablet By Mouth Every Other Day 3)  Kyolic .... 2 Two Times A Day 4)  Oscal 500/200 D-3 500-200 Mg-Unit  Tabs (Calcium-Vitamin D) .... Take 1 Tablet By Mouth Two Times A Day 5)  Multivitamins   Tabs (Multiple Vitamin) .... Take 1 Tablet By Mouth Once A Day 6)  Hydrocortisone Valerate 0.2 % Crea (Hydrocortisone Valerate) .... Use As Needed For Facial Rash. 7)  Aleve 220 Mg Tabs (Naproxen Sodium) .[name].. 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  The patient denies anorexia, fever, weight loss, weight gain, chest pain, dyspnea on exertion, abdominal pain, severe indigestion/heartburn, suspicious skin lesions, depression, and enlarged lymph nodes.    Physical Exam  General:  WNWD well groomed woman in no distress Head:  Normocephalic and atraumatic without obvious abnormalities. No apparent alopecia or balding. Msk:  decreased ROM left shoulder with overhead extension.  Back exam: nl stand, flex at waist, gait, toe/heel walk. She has a little trouble stepping up to exam table with left leg. Nl SLR sitting, DTRs at patellar and achilles tendons. No CVAT. Pulses:  trace to 1+ dorsalis pedis and posterior tibial pulese bilaterally.    Impression & Recommendations:  Problem # 1:  SHOULDER PAIN, LEFT (ICD-719.41) Patient will continue with PT  Her updated medication list for this problem includes:    Aleve 220 Mg Tabs (Naproxen sodium) .[name]... 1 to 2 as needed  Problem # 2:  WEAKNESS (ICD-780.79)  Patient with  leg weakness, i.e. trouble with deep squats and stair-climbing. Back exam without radicular findings. Diminished pulses noted. Origin of symptoms may be mild back problems vs proximal LE claudication.  Plan - schedule for LE arterial doppler           have physical therapist help with strengthening program.   Orders: LE Arterial Doppler/ABI (Le arterial doppler)  Complete Medication List: 1)  Alendronate Sodium 70 Mg Tabs (Alendronate sodium) .... One tablet by mouth on the same day weekly w/  a full glass of water. 2)  Activella  .... Take one tablet by mouth every other day 3)  Kyolic  .... 2 two times a day 4)  Oscal 500/200  D-3 500-200 Mg-unit Tabs (Calcium-vitamin d) .... Take 1 tablet by mouth two times a day 5)  Multivitamins Tabs (Multiple vitamin) .... Take 1 tablet by mouth once a day 6)  Hydrocortisone Valerate 0.2 % Crea (Hydrocortisone valerate) .... Use as needed for facial rash. 7)  Aleve 220 Mg Tabs (Naproxen sodium) .[name].. 1 to 2 as needed

## 2010-06-13 NOTE — Letter (Signed)
Summary: [facility]   Imported By: Sherian Rein 08/28/2009 09:55:38  _____________________________________________________________________  External Attachment:    Type:   Image     Comment:   External Document

## 2010-06-13 NOTE — Letter (Signed)
   [facility] [address] [place], [place]  [address] Phone: [phone]      March 11, 2010   [facility] [address] [place], [place] [address]  RE:  LAB RESULTS  Dear  Ms. LOUGHNEY,  The following is an interpretation of your most recent lab tests.  Please take note of any instructions provided or changes to medications that have resulted from your lab work.    Your bone density study reveals a normal spine and moderate osteopenia of the hips.    You need 1200mg  daily of calcium and 1000 international units of Vitamin D. You should continue your regular exercise program. You may want to discuss the merits of medical therapy with Dr Pennie Rushing or myself. A copy of the full report is attached.   Sincerely Yours,    Jacques Navy MD

## 2010-06-13 NOTE — Progress Notes (Signed)
Summary: Back Pain   Phone Note Call from Patient   Summary of Call: Pt stooped down to give pick something up and had a pain in her lower back. Pain is in the lower back more toward her side. She has started using aleve two times a day, muscle relaxer originally given for her shoulder and heat. This has given relief. Advised patient if pain becomes worse, new symptoms or pain is not better w/in reasonable amount of time call office for eval. Pt agreed.  Initial call taken by: Lamar Sprinkles, CMA,  October 24, 2009 4:36 PM  Follow-up for Phone Call        noted Follow-up by: Jacques Navy MD,  October 25, 2009 5:52 AM

## 2010-07-15 DIAGNOSIS — N952 Postmenopausal atrophic vaginitis: Secondary | ICD-10-CM

## 2010-07-15 HISTORY — DX: Postmenopausal atrophic vaginitis: N95.2

## 2010-09-24 NOTE — Assessment & Plan Note (Signed)
[facility]                           PRIMARY CARE OFFICE NOTE   HARRYETTE, SHUART                      MRN:          [id number]  DATE:01/04/2007                            DOB:          [birth year]    Ms. Van is a delightful 65 year old African American woman who  presents today for routine examination.  She was last seen in the office  April 20, 2006, for right arm pain diagnosed as short head biceps  tendinitis versus bursitis of the shoulder.  She was treated with range  of motion exercise and Aleve and has done well.   INTERVAL HISTORY:  The patient reports that she is having a mild type of  discomfort in her left lower extremity.  She has a difficult time  describing her symptoms other than a discomfort.  She is concerned for  peripheral arterial disease.  She otherwise reports she is doing well  and feeling well.   HEALTH MAINTENANCE:  The patient did see Dr. Dierdre Forth today for  routine GYN and breast exam, which went well.  She did have a mammogram  December 02, 2006, revealing probable benign left breast microcalcifications  with a 54-month follow-up recommended.   PAST MEDICAL HISTORY:  Well-documented in my note of November 14, 2005, with  no change.   FAMILY HISTORY:  Well-documented and complete.   SOCIAL HISTORY:  The patient's husband has taken a job for a school  system in crisis in [place] and they are now living [place].  This is going relatively well for her.  The patient reports that her  husband had significant health issues, having undergone recent  cardiology evaluation and testing, cardiac catheterization leading to  PCI and stenting of two vessels.  By her report, the maximum lesion was  65%.  This had been preceded by what sounds like a nuclear stress test.  In addition, he had been recently diagnosed with sleep apnea and now he  is using a CPAP mask.   CURRENT MEDICATIONS:  1. Fosamax 70 mg weekly.  2. Activella every other day.  3. Kyloic two tablets daily.  4. Os-Cal With D b.i.d.  5. Multivitamin.   REVIEW OF SYSTEMS:  The patient has had no fevers, sweats or chills.  She has had no change in her sleep habit, which is a night owl habits,  retiring between 2 and 3 a.m., sleeping until 9.  She has had a 3-pound  weight loss over the last year.  She has had an eye exam in the last  year and has no visual changes.  No ENT, dental, cardiovascular,  respiratory, GI or GU complaints.  The patient reports she does have  some chest wall discomfort following mammography.   LIMITED PHYSICAL EXAM:  Temperature was 97.9, blood pressure 120/70,  heart rate was 75, respirations were 14 and regular, weight 135 pounds.  GENERAL APPEARANCE:  This is a slender woman who looks younger than her  stated chronologic age, in no acute distress.  HEENT:  Normocephalic, atraumatic.  EACs and TMs were normal.  Oropharynx with native  dentition in good repair.  No buccal or palatal  lesions were noted.  Posterior pharynx was clear.  Conjunctivae and  sclerae were clear.  PERRLA, EOMI.  Funduscopic exam with hand-held  instrument was normal with normal disc margins.  NECK:  Supple without thyromegaly.  NODES:  No adenopathy was noted in the cervical or supraclavicular  regions.  CHEST:  No deformities were noted, no CVA tenderness is appreciated.  LUNGS:  Clear with no rales, wheezes or rhonchi.  BREASTS:  Exam deferred to Dr. Pennie Rushing.  CARDIOVASCULAR:  2+ radial pulse, no JVD, no carotid bruits.  She had a  quiet precordium with a regular rate and rhythm without murmurs, rubs or  gallops.  ABDOMEN:  Soft, no guarding, no rebound.  No organosplenomegaly was  appreciated.  PELVIC, RECTAL:  Exams deferred to gynecology.  EXTREMITIES:  The patient when standing does have a short left leg, not  quantitated.  The patient had a 2+ dorsalis pedis pulse, posterior  tibial pulse, a very good femoral pulse was  noted.  The patient has full  range of motion about the ankle and knee.  The patient had no tenderness  or discomfort with internal or external rotation of either hip.  She had  no tenderness to AP pressure or lateral compression against her greater  trochanter.  SKIN:  Full body exam was not performed but she had no obvious skin  lesions, head, neck, or back.   LABORATORY:  Hemoglobin was 13.7 g, white count was 3600 with a normal  differential.  Chemistries were unremarkable with a serum glucose of 94,  electrolytes were normal, liver function is normal, renal function  normal with a creatinine of 0.9 and a GFR of 82 mL/min.  Cholesterol is  227, triglycerides 22, HDL cholesterol 85.4, LDL cholesterol 127.7.  Thyroid normal with a TSH of 1.10.  Urinalysis was negative.   ASSESSMENT AND PLAN:  1. Allergic rhinitis, stable at this time.  2. Osteoporosis.  The patient's last DEXA scan was in July 2007, which      showed that she had a T-score of -2.667 at the left femoral neck.      She does tolerate her bisphosphonate therapy well and will be due      for a follow-up bone density study in July 2009.  3. Chronic leukopenia.  This is stable with the patient's white blood      count remaining constant in the 3.4-3.6 range.  4. Health maintenance.  The patient's next colonoscopy would be due      2014.  She is current with her gynecologist and with mammography.      The patient's last tetanus booster was in 2002.  The patient would      be a candidate for Zostavax when available.  She also would be a      candidate for pneumonia vaccine at her discretion.   In summary, this is a very pleasant woman.  She was reassured that her  cholesterol levels are excellent, that her LDL is definitely less than  goal of 130 for a woman at low risk for cardiovascular disease, and that  she has a very protective elevated HDL.  Straight dietary management  would be recommended, although no medical  intervention is indicated.   The patient is reassured that she has good circulation.  She has no  obvious limitation in range of motion of her hip.  I suspect she may  have some early osteoarthritis  and would recommend use of Aleve for  relief of discomfort.  She may also try acetaminophen.   The patient is encouraged to contact me by E-mail even from [place] if  she has any problems or complaints that I can help manage.  She  otherwise is instructed to return to see me on a p.r.n. basis or in one  year.     Rosalyn Gess Norins, MD  Electronically Signed    MEN/MedQ  DD: 01/05/2007  DT: 01/05/2007  Job #: [id number]   cc:   Will Bonnet, M.D.

## 2010-10-29 ENCOUNTER — Telehealth: Payer: Self-pay

## 2010-10-29 ENCOUNTER — Other Ambulatory Visit: Payer: Self-pay | Admitting: Internal Medicine

## 2010-10-29 DIAGNOSIS — Z1231 Encounter for screening mammogram for malignant neoplasm of breast: Secondary | ICD-10-CM

## 2010-10-29 NOTE — Telephone Encounter (Signed)
Patient called lmovm c/o left thigh discomfort. She is not sure if this could be a nerve or circulation problem and would like to know what MD thinks. Please advise thanks.

## 2010-11-01 ENCOUNTER — Telehealth: Payer: Self-pay | Admitting: *Deleted

## 2010-11-01 NOTE — Telephone Encounter (Signed)
C/o thigh discomfort off and on x 3 wks. Advised ov for eval - she wanted to come same day as husband, scheduled for Monday

## 2010-11-04 ENCOUNTER — Encounter: Payer: Self-pay | Admitting: Internal Medicine

## 2010-11-04 ENCOUNTER — Ambulatory Visit (INDEPENDENT_AMBULATORY_CARE_PROVIDER_SITE_OTHER): Payer: Medicare Other | Admitting: Internal Medicine

## 2010-11-04 VITALS — BP 102/62 | HR 75 | Temp 98.8°F | Resp 14 | Wt 135.8 lb

## 2010-11-04 DIAGNOSIS — G5710 Meralgia paresthetica, unspecified lower limb: Secondary | ICD-10-CM

## 2010-11-04 NOTE — Telephone Encounter (Signed)
Patient has appt 11/04/10, closing phone note

## 2010-11-04 NOTE — Progress Notes (Signed)
  Subjective:    Patient ID: Wendy Serrano, female    DOB: 10/22/1945, 65 y.o.   MRN: [id number]  HPI Wendy Serrano presents for a 3 week h/o change in sensation at the lateral left thigh. She has no pain, no weakness, no injury. She does wear some tight garments.  PMH, FamHx and SocHx reviewed for any changes and relevance.    Review of Systems Review of Systems  Constitutional:  Negative for fever, chills, activity change and unexpected weight change.  HEENT:  Negative for hearing loss, ear pain, congestion, neck stiffness and postnasal drip. Negative for sore throat or swallowing problems. Negative for dental complaints.   Eyes: Negative for vision loss or change in visual acuity.  Respiratory: Negative for chest tightness and wheezing.   Cardiovascular: Negative for chest pain and palpitation. No decreased exercise tolerance Gastrointestinal: No change in bowel habit. No bloating or gas. No reflux or indigestion Genitourinary: Negative for urgency, frequency, flank pain and difficulty urinating.  Musculoskeletal: Negative for myalgias, back pain, arthralgias and gait problem.  Neurological: Negative for dizziness, tremors, weakness and headaches.  Hematological: Negative for adenopathy.  Psychiatric/Behavioral: Negative for behavioral problems and dysphoric mood.       Objective:   Physical Exam Vitals noted - normal Gen'l - WNWD nicely groomed AA woman in no distress HEENT - nl PUl- normal respirations. Neuro-A&O x 3, normal gait and movement, CN II-XII grossly normal. Sensation decreased to light touch and pin-prick in a narrow band of the left lateral thigh.       Assessment & Plan:  1. Meralgia Paresthetica - mild  Plan - supportive therapy           Provided chpt from [employer].

## 2010-12-09 ENCOUNTER — Ambulatory Visit
Admission: RE | Admit: 2010-12-09 | Discharge: 2010-12-09 | Disposition: A | Discharge status: Home / Self Care | Payer: Medicare Other | Source: Ambulatory Visit | Attending: Internal Medicine | Admitting: Internal Medicine

## 2010-12-09 ENCOUNTER — Ambulatory Visit (HOSPITAL_COMMUNITY): Payer: Medicare Other

## 2010-12-09 DIAGNOSIS — Z1231 Encounter for screening mammogram for malignant neoplasm of breast: Secondary | ICD-10-CM

## 2011-02-26 ENCOUNTER — Other Ambulatory Visit (INDEPENDENT_AMBULATORY_CARE_PROVIDER_SITE_OTHER): Payer: Medicare Other

## 2011-02-26 ENCOUNTER — Other Ambulatory Visit: Payer: Self-pay | Admitting: Internal Medicine

## 2011-02-26 ENCOUNTER — Encounter: Payer: Self-pay | Admitting: Internal Medicine

## 2011-02-26 ENCOUNTER — Ambulatory Visit (INDEPENDENT_AMBULATORY_CARE_PROVIDER_SITE_OTHER): Payer: Medicare Other | Admitting: Internal Medicine

## 2011-02-26 VITALS — BP 128/72 | HR 85 | Temp 97.9°F | Wt 135.0 lb

## 2011-02-26 DIAGNOSIS — M25519 Pain in unspecified shoulder: Secondary | ICD-10-CM

## 2011-02-26 DIAGNOSIS — G629 Polyneuropathy, unspecified: Secondary | ICD-10-CM

## 2011-02-26 DIAGNOSIS — I739 Peripheral vascular disease, unspecified: Secondary | ICD-10-CM

## 2011-02-26 DIAGNOSIS — R5381 Other malaise: Secondary | ICD-10-CM

## 2011-02-26 DIAGNOSIS — G609 Hereditary and idiopathic neuropathy, unspecified: Secondary | ICD-10-CM

## 2011-02-26 DIAGNOSIS — B009 Herpesviral infection, unspecified: Secondary | ICD-10-CM

## 2011-02-26 DIAGNOSIS — Z0000 Encounter for general adult medical examination without abnormal findings: Secondary | ICD-10-CM

## 2011-02-26 DIAGNOSIS — Z136 Encounter for screening for cardiovascular disorders: Secondary | ICD-10-CM

## 2011-02-26 DIAGNOSIS — R5383 Other fatigue: Secondary | ICD-10-CM

## 2011-02-26 DIAGNOSIS — M810 Age-related osteoporosis without current pathological fracture: Secondary | ICD-10-CM

## 2011-02-26 DIAGNOSIS — Z23 Encounter for immunization: Secondary | ICD-10-CM

## 2011-02-26 LAB — LIPID PANEL
Cholesterol: 219 mg/dL — ABNORMAL HIGH (ref 0–200)
Total CHOL/HDL Ratio: 2
Triglycerides: 27 mg/dL (ref 0.0–149.0)
VLDL: 5.4 mg/dL (ref 0.0–40.0)

## 2011-02-26 LAB — COMPREHENSIVE METABOLIC PANEL
ALT: 18 U/L (ref 0–35)
BUN: 17 mg/dL (ref 6–23)
CO2: 31 mEq/L (ref 19–32)
Creatinine, Ser: 0.7 mg/dL (ref 0.4–1.2)
GFR: 100.82 mL/min (ref >60.00)
Total Bilirubin: 1 mg/dL (ref 0.3–1.2)

## 2011-02-26 LAB — HEPATIC FUNCTION PANEL
Alkaline Phosphatase: 45 U/L (ref 39–117)
Bilirubin, Direct: 0.1 mg/dL (ref 0.0–0.3)
Total Bilirubin: 1 mg/dL (ref 0.3–1.2)
Total Protein: 6.7 g/dL (ref 6.0–8.3)

## 2011-02-26 LAB — LDL CHOLESTEROL, DIRECT: Direct LDL: 113.4 mg/dL

## 2011-02-26 MED ORDER — VALACYCLOVIR HCL 1 G PO TABS: ORAL_TABLET | ORAL | Status: DC

## 2011-02-26 MED ORDER — HYDROCORTISONE VALERATE 0.2 % EX CREA: 1.0000 "application " | TOPICAL_CREAM | Freq: Two times a day (BID) | CUTANEOUS | Status: DC

## 2011-02-26 NOTE — Progress Notes (Signed)
Subjective:    Patient ID: Wendy Serrano, female    DOB: 02/17/1946, 65 y.o.   MRN: [id number]  HPI   The patient is here for annual Medicare wellness examination and management of other chronic and acute problems. She has several concerns: a lesion on the right back - comedone?;  Left chest anteriorly with burning discomfort. No problem on breast exam by Dr. Pennie Rushing. Concern for possible injury from mammogram. Question of sterno-chondral joint inflammation. Concern for diabetes - reviewed chart serum glucose normal last 3 years, A1C 5.6% in '11; circulation concern - had LE Arterial doppler '11 - normal with ABI 1.1 and 1.0. 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   The risk factors are reflected in the social history.  The roster of all physicians providing medical care to patient - is listed in the Snapshot section of the chart.  Activities of daily living:  The patient is 100% inedpendent in all ADLs: dressing, toileting, feeding as well as independent mobility  Home safety : The patient has smoke detectors in the home. They wear seatbelts. No firearms at home . There is no violence in the home.   There is no risks for hepatitis, STDs or HIV. There is no  history of blood transfusion. They have no travel history to infectious disease endemic areas of the world.  The patient has seen their dentist in the last six month. They have seen their eye doctor in the last year. They deny any hearing difficulty and have not had audiologic testing in the last year.  They do not  have excessive sun exposure. Discussed the need for sun protection: hats, long sleeves and use of sunscreen if there is significant sun exposure.   Diet: the importance of a healthy diet is discussed. They do have a healthy diet.  The patient does not have a regular exercise program.  The benefits of regular aerobic exercise were discussed.  Depression screen: there are no signs or  vegative symptoms of depression- irritability, change in appetite, anhedonia, sadness/tearfullness.  Cognitive assessment: the patient manages all their financial and personal affairs and is actively engaged. They could relate day,date,year and events; recalled 3/3 objects at 3 minutes; performed clock-face test normally.  The following portions of the patient's history were reviewed and updated as appropriate: allergies, current medications, past family history, past medical history,  past surgical history, past social history  and problem list.  Vision, hearing, body mass index were assessed and reviewed.   During the course of the visit the patient was educated and counseled about appropriate screening and preventive services including : fall prevention , diabetes screening, nutrition counseling, colorectal cancer screening, and recommended immunizations.   Review of Systems Constitutional:  Negative for fever, chills, activity change and unexpected weight change.  HEENT:  Negative for hearing loss, ear pain, congestion, neck stiffness and postnasal drip. Negative for sore throat or swallowing problems. Negative for dental complaints.   Eyes: Negative for vision loss or change in visual acuity.  Respiratory: Negative for chest tightness and wheezing. Negative for DOE.   Cardiovascular: Negative for chest pain or palpitations. No decreased exercise tolerance Gastrointestinal: No change in bowel habit. No bloating or gas. No reflux or indigestion Genitourinary: Negative for urgency, frequency, flank pain and difficulty urinating.  Musculoskeletal: Negative for myalgias, back pain, arthralgias and gait problem except for on-going left shoulder pain with some loss of ROM.  Neurological: Negative  for dizziness, tremors, weakness and headaches.  Hematological: Negative for adenopathy.  Psychiatric/Behavioral: Negative for behavioral problems and dysphoric mood.       Objective:   Physical  Exam virtals reviewed and normal Gen'l - well nourished and developed AA woman looking younger than her stated age. HEENT - C&S clear, PERRLA, fundiscopic exam deferred to opthal. EACs/TMs normal. Oropharynx normal without lesions Neck - supple, no thyromegaly Nodes - negative cervical or supraclavicular Chest - no deformity. Tender at the costo-sternal junction R6-7. Lungs - Clear Cor- 2+ radial and DP/PT pulses, No JVD, no carotid bruits, RRR without murmur Breast - deferred to Dr. Pennie Rushing Abdomen - BS+, no HSM, no guarding or rebound Pelvic - deferred to Dr. Pennie Rushing Ext - normal without deformity Derm - comedone left of midline at T3 - manipulation under sterile conditions revealed only dark comedone material. Derm - comedone central back at T1, multiple skin tags neck Neuro - A&O x 3, CN II-XII normal, MS normal, DTRs normal throughout  Lab Results  Component Value Date   WBC 3.6* 02/19/2010   HGB 13.8 02/19/2010   HCT 40.4 02/19/2010   PLT 244.0 02/19/2010   GLUCOSE 89 02/26/2011   CHOL 219* 02/26/2011   TRIG 27.0 02/26/2011   HDL 93.30 02/26/2011   LDLDIRECT 113.4 02/26/2011   ALT 18 02/26/2011   ALT 18 02/26/2011   AST 20 02/26/2011   AST 20 02/26/2011   NA 140 02/26/2011   K 3.3* 02/26/2011   CL 104 02/26/2011   CREATININE 0.7 02/26/2011   BUN 17 02/26/2011   CO2 31 02/26/2011   TSH 1.92 02/26/2011   HGBA1C 5.4 02/26/2011          Assessment & Plan:  1. Glucose metabolism - serum glucose and A1C are normal ruling out glucose metabolism (diabetes) problems.  2. Comedone - unable to completely express this comedone.  Plan - at patient's discretion she may return for minor I&D for definitive removal  3. Costo-sternal inflammation - moderate discomfort.   Plan - lineament of choice, e.g. Aspercreme, etc; heat. May return for steroid injection is not improved.

## 2011-02-26 NOTE — Progress Notes (Signed)
  Subjective:    Patient ID: Wendy Serrano, female    DOB: 1945-05-26, 66 y.o.   MRN: [id number]  HPI    Review of Systems     Objective:   Physical Exam        Assessment & Plan:

## 2011-03-01 DIAGNOSIS — Z0000 Encounter for general adult medical examination without abnormal findings: Secondary | ICD-10-CM | POA: Insufficient documentation

## 2011-03-01 NOTE — Assessment & Plan Note (Signed)
Interval history without major illness, surgery or injury. She is current with Gyn for pelvic/PAP and breast exam. Colorectal cancer screening - due for follow-up 2014. Immunizations - current tetanus and pneumonia. Due for bone density. No evidence for glucose problems. Minor comedone as noted. 12 lead EGK - no ischemia or injury or strain.   In summary - a very nice woman who appears to be medically stable. Lipid profile with outstanding HDL - very protective, LDL less than goal of 130 and LDL/HDL less than 2 - very protective. She is encouraged to resume regular aerobic exercise and whole body flex and stretch. No recommendations for diet improvement.

## 2011-03-01 NOTE — Assessment & Plan Note (Signed)
On exam there are normal peripheral pulses. Lower extremity arterial doppler was normal. No carotid bruits on exam. Reviewed MRA report - normal.  No evidence of any peripheral vascular disease.

## 2011-03-01 NOTE — Assessment & Plan Note (Signed)
No active lesions now or in the recent past.  Plan - Rx for Valtrex 2 g AM/PM single day dosing to use as needed

## 2011-03-01 NOTE — Assessment & Plan Note (Signed)
Internal disruption - but no indications, per orthopedist, for any intervention. She is no longer attending physical therapy but she does continue to do range of motion and limbering exercises on her own.

## 2011-03-01 NOTE — Assessment & Plan Note (Signed)
Due for bone density study at her convenience unless done elsewhere with no report to file.

## 2011-03-02 ENCOUNTER — Encounter: Payer: Self-pay | Admitting: Internal Medicine

## 2011-11-05 ENCOUNTER — Telehealth: Payer: Self-pay | Admitting: Obstetrics and Gynecology

## 2011-11-05 NOTE — Telephone Encounter (Signed)
Pt states she feels like she has a pulled muscle in vaginal area and only wants to see Dr Pennie Rushing. 1st available rgyn appointment is 7/17 but she wants to be seen sooner. Advised pt that message would be sent to Dr Pennie Rushing assistant to see if an earlier appointment can be made.

## 2011-11-05 NOTE — Telephone Encounter (Signed)
Triage/cht received 

## 2011-11-06 NOTE — Telephone Encounter (Signed)
Tc to pt per telephone call. Pt c/o right side groin pain x 2weeks. No fever. No vaginal bleeding. Appt sched for eval 11/11/11@4 :30 with vph. Pt agrees.

## 2011-11-06 NOTE — Telephone Encounter (Signed)
Tc to pt per telephone call.  

## 2011-11-11 ENCOUNTER — Encounter: Payer: Self-pay | Admitting: Obstetrics and Gynecology

## 2011-11-11 ENCOUNTER — Ambulatory Visit (INDEPENDENT_AMBULATORY_CARE_PROVIDER_SITE_OTHER): Payer: Medicare Other | Admitting: Obstetrics and Gynecology

## 2011-11-11 VITALS — BP 110/60 | Ht 67.0 in | Wt 137.0 lb

## 2011-11-11 DIAGNOSIS — N949 Unspecified condition associated with female genital organs and menstrual cycle: Secondary | ICD-10-CM

## 2011-11-11 DIAGNOSIS — R102 Pelvic and perineal pain: Secondary | ICD-10-CM

## 2011-11-11 LAB — POCT URINALYSIS DIPSTICK
Bilirubin, UA: NEGATIVE
Blood, UA: NEGATIVE
Glucose, UA: NEGATIVE
Leukocytes, UA: NEGATIVE
Nitrite, UA: NEGATIVE
Urobilinogen, UA: NEGATIVE

## 2011-11-11 MED ORDER — CYCLOBENZAPRINE HCL 5 MG PO TABS: 5.0000 mg | ORAL_TABLET | Freq: Three times a day (TID) | ORAL | Status: DC | PRN

## 2011-11-11 NOTE — Patient Instructions (Signed)
Muscle Strain A muscle strain, or pulled muscle, occurs when a muscle is over-stretched. A small number of muscle fibers may also be torn. This is especially common in athletes. This happens when a sudden violent force placed on a muscle pushes it past its capacity. Usually, recovery from a pulled muscle takes 1 to 2 weeks. But complete healing will take 5 to 6 weeks. There are millions of muscle fibers. Following injury, your body will usually return to normal quickly. HOME CARE INSTRUCTIONS   While awake, apply ice to the sore muscle for 15 to 20 minutes each hour for the first 2 days. Put ice in a plastic bag and place a towel between the bag of ice and your skin.   Do not use the pulled muscle for several days. Do not use the muscle if you have pain.   You may wrap the injured area with an elastic bandage for comfort. Be careful not to bind it too tightly. This may interfere with blood circulation.   Only take over-the-counter or prescription medicines for pain, discomfort, or fever as directed by your caregiver. Do not use aspirin as this will increase bleeding (bruising) at injury site.   Warming up before exercise helps prevent muscle strains.  SEEK MEDICAL CARE IF:  There is increased pain or swelling in the affected area. MAKE SURE YOU:   Understand these instructions.   Will watch your condition.   Will get help right away if you are not doing well or get worse.  Document Released: 04/28/2005 Document Revised: 04/17/2011 Document Reviewed: 11/25/2006 [employer] Patient Information 2012 [employer]. 

## 2011-11-11 NOTE — Progress Notes (Signed)
GROIN SYMPTOMS  SUBJECTIVE:Pt having Right Side Groin Issues.for last 2 wks.  States that she is not having any pain. It just doesn't "feel right".  No GI or GU sx.  Just doesn't feel like the other side.  Her symptoms started when she moved a chair and a rug at her home.  The indescribable sensation is present daily but is not pain and doesn't require any medication.  OBJECTIVE: BP 110/60  Ht 5\' 7"  (1.702 m)  Wt 137 lb (62.143 kg)  BMI 21.46 kg/m2 Pelvic exam:,  VULVA: normal appearing vulva with no masses, tenderness or lesions, are   VAGINA: normal appearing vagina with normal color and discharge, no lesions, atrophic,    CERVIX: normal appearing cervix without discharge or lesions, atrophic with stenotic os,    UTERUS: uterus is normal size, shape, consistency and nontender,    ADNEXA: no masses,    RECTAL: normal rectal, no masses, normal sphincter tone. UA:  Neg  ASSESSMENT: Probable muscle strain  RECOMMENDATION: Written instructions for care Flexeril prn F/U for AEX or prn .

## 2011-11-24 ENCOUNTER — Other Ambulatory Visit: Payer: Self-pay | Admitting: Internal Medicine

## 2011-11-24 DIAGNOSIS — Z1231 Encounter for screening mammogram for malignant neoplasm of breast: Secondary | ICD-10-CM

## 2011-12-10 ENCOUNTER — Ambulatory Visit: Payer: Medicare Other

## 2011-12-11 ENCOUNTER — Ambulatory Visit
Admission: RE | Admit: 2011-12-11 | Discharge: 2011-12-11 | Disposition: A | Discharge status: Home / Self Care | Payer: Medicare Other | Source: Ambulatory Visit | Attending: Internal Medicine | Admitting: Internal Medicine

## 2011-12-11 DIAGNOSIS — Z1231 Encounter for screening mammogram for malignant neoplasm of breast: Secondary | ICD-10-CM

## 2011-12-18 ENCOUNTER — Encounter (HOSPITAL_COMMUNITY): Payer: Self-pay

## 2011-12-18 ENCOUNTER — Emergency Department (INDEPENDENT_AMBULATORY_CARE_PROVIDER_SITE_OTHER)
Admission: EM | Admit: 2011-12-18 | Discharge: 2011-12-18 | Disposition: A | Discharge status: Home / Self Care | Payer: Medicare Other | Source: Home / Self Care | Attending: Family Medicine | Admitting: Family Medicine

## 2011-12-18 ENCOUNTER — Encounter: Payer: Self-pay | Admitting: Obstetrics and Gynecology

## 2011-12-18 DIAGNOSIS — M62838 Other muscle spasm: Secondary | ICD-10-CM

## 2011-12-18 MED ORDER — CYCLOBENZAPRINE HCL 5 MG PO TABS: 5.0000 mg | ORAL_TABLET | Freq: Three times a day (TID) | ORAL | Status: AC | PRN

## 2011-12-18 MED ORDER — DICLOFENAC POTASSIUM 50 MG PO TABS: 50.0000 mg | ORAL_TABLET | Freq: Three times a day (TID) | ORAL | Status: DC

## 2011-12-18 NOTE — ED Provider Notes (Signed)
History     CSN: [id number]  Arrival date & time 12/18/11  4098   First MD Initiated Contact with Patient 12/18/11 1903      Chief Complaint  Patient presents with  . Neck Pain    (Consider location/radiation/quality/duration/timing/severity/associated sxs/prior treatment) Patient is a 65 y.o. female presenting with neck pain. The history is provided by the patient.  Neck Pain  This is a new problem. The current episode started 3 to 5 hours ago. The problem has been rapidly improving. The pain is associated with twisting.    Past Medical History  Diagnosis Date  . Leukopenia     chronic  . 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 Allergic rhinitis   . Eczema     childhood  . Rotator cuff tear   . 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 H/O varicella   . History of measles, mumps, or rubella   . Yeast infection   . H/O osteopenia 01/09/04  . Menopausal symptoms   . H/O osteoporosis 2008  . 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 Shoulder arthroscopy 08/2009    Left shoulder-rotator cuff repair [name])  . Wisdom tooth extraction     Family History  Problem Relation Age of Onset  . Prostate cancer Father     died  . Pancreatic cancer Mother     died  . Diabetes Mother   . Hyperlipidemia Sister     History  Substance Use Topics  . Smoking status: Never Smoker   . Smokeless tobacco: Never Used  . Alcohol Use: No    OB History    Grav Para Term Preterm Abortions TAB SAB Ect Mult Living   0 0 0 0 0 0 0 0 0 0       Review of Systems  Constitutional: Negative.   HENT: Positive for neck pain.   Musculoskeletal: Positive for arthralgias.  Neurological: Negative.     Allergies  Review of patient's allergies indicates no known allergies.  Home Medications   Current Outpatient Rx  Name Route Sig Dispense Refill  . CALCIUM CARBONATE-VITAMIN D 500-200 MG-UNIT PO TABS Oral Take  1 tablet by mouth 2 (two) times daily.      . ACTIVELLA PO Oral Take by mouth. Take one tablet by mouth every other day     . GARLIC PO TABS Oral Take 4 tablets by mouth daily.      [name] ONE-DAILY MULTI VITAMINS PO TABS Oral Take 1 tablet by mouth daily.      . CYCLOBENZAPRINE HCL 5 MG PO TABS Oral Take 1 tablet (5 mg total) by mouth every 8 (eight) hours as needed for muscle spasms. 30 tablet 1  . CYCLOBENZAPRINE HCL 5 MG PO TABS Oral Take 1 tablet (5 mg total) by mouth 3 (three) times daily as needed for muscle spasms. 30 tablet 0  . DICLOFENAC POTASSIUM 50 MG PO TABS Oral Take 1 tablet (50 mg total) by mouth 3 (three) times daily. 30 tablet 0  . ESTRADIOL 0.1 MG/GM VA CREA Vaginal Place 2 g vaginally once a week.      [name] VALACYCLOVIR HCL 1 G PO TABS  2 tablets AM and 2 tablets PM x 1 day for HSV skin eruption 4 tablet 1    BP 117/73  Pulse 74  Temp 98.9 F (37.2 C) (Oral)  Resp 20  SpO2 100%  Physical Exam  Nursing note and vitals reviewed. Constitutional: She is oriented to person, place, and time. She appears well-developed and well-nourished.  Musculoskeletal: She exhibits tenderness.       Arms: Neurological: She is alert and oriented to person, place, and time. No cranial nerve deficit.  Skin: Skin is warm and dry.    ED Course  Procedures (including critical care time)  Labs Reviewed - No data to display No results found.   1. Neck muscle spasm       MDM          Linna Hoff, MD 12/22/11 2012

## 2011-12-18 NOTE — ED Notes (Signed)
Pt c/o rt sided neck pain that started around 4:30 pm today.  States it was intense at first but much better now.  States it only hurts when he turns her head side to side- rates her pain 2/10.  Denies numbness, tingling or weakness in extremities.

## 2011-12-23 ENCOUNTER — Telehealth: Payer: Self-pay | Admitting: Obstetrics and Gynecology

## 2011-12-23 NOTE — Telephone Encounter (Signed)
rec F/U with PCP since no gyn reason for groin discomfort found at last visit .

## 2011-12-23 NOTE — Telephone Encounter (Signed)
CHANDRA/GENERAL QUEST. °

## 2011-12-23 NOTE — Telephone Encounter (Signed)
Tc from pt per telephone call. Pt c/o persistant right groin discomfort since last ov on 11/11/11. Pt states",did not try Flexeril pres at that time". Pt denies fever. Pt denies swelling or heat of groin area. Pt was seen at an urgent care facility last week for neck pain and was pres Flexeril. Pt has used 2 pills only. Will consult with vph per further recs. Denies UTI sx's. Pt voices understanding.

## 2011-12-23 NOTE — Telephone Encounter (Signed)
Lm on vm to cb per telephone call.  

## 2011-12-24 ENCOUNTER — Telehealth: Payer: Self-pay | Admitting: Internal Medicine

## 2011-12-24 NOTE — Telephone Encounter (Signed)
Tc to pt per vph recs to fu with pcp rgdg groin discomfort due to no gyn reason found for discomfort at last ov. Pt voices understanding.

## 2011-12-24 NOTE — Telephone Encounter (Signed)
Caller: Jaryn/Patient; Patient Name: Wendy Serrano; PCP: Illene Regulus; Best Callback Phone Number: [phone]  Patient states she developed right groin pain, onset 11/10/11. Patient states she was evaluated by Gynecologist 11/2011 and advised to follow up with Dr. Debby Bud if symptoms persisted. Patient states discomfort persists. Patient states no swelling, bulging or lump noted. States pain does not increase with movement. Patient denies abdominal pain. Denies urinary symptoms. Afebrile. Patient states she was moving a "side chair" 10/2011 and felt a "pull" in groin. Patient describes as "discomfort." States discomfort does not interfere with her normal activities. Triage per Abdominal Pain and Muscle Pain Protocol. No emergent symptoms identified. Care advice given per guidelines. Patient advised warm compresses, Ibuprofen 600mg . every 6 hours as needed for discomfort. Call back parameters reviewed. Patient verbalizes understanding. Appointment recommended for positive triage assessment of Localized Muscle Pain and Intermittent pelvic pain. Patient declines offered appointment for 12/24/11. Patient requests appointment with Dr. Debby Bud for 12/25/11. Appointment scheduled for 12/25/11 1130 with Dr. Debby Bud.

## 2011-12-25 ENCOUNTER — Encounter: Payer: Self-pay | Admitting: Internal Medicine

## 2011-12-25 ENCOUNTER — Ambulatory Visit (INDEPENDENT_AMBULATORY_CARE_PROVIDER_SITE_OTHER): Payer: Medicare Other | Admitting: Internal Medicine

## 2011-12-25 ENCOUNTER — Other Ambulatory Visit (INDEPENDENT_AMBULATORY_CARE_PROVIDER_SITE_OTHER): Payer: Medicare Other

## 2011-12-25 VITALS — BP 100/68 | HR 86 | Temp 98.5°F | Resp 16 | Wt 134.0 lb

## 2011-12-25 DIAGNOSIS — I739 Peripheral vascular disease, unspecified: Secondary | ICD-10-CM

## 2011-12-25 DIAGNOSIS — E789 Disorder of lipoprotein metabolism, unspecified: Secondary | ICD-10-CM | POA: Insufficient documentation

## 2011-12-25 DIAGNOSIS — M549 Dorsalgia, unspecified: Secondary | ICD-10-CM

## 2011-12-25 DIAGNOSIS — R1903 Right lower quadrant abdominal swelling, mass and lump: Secondary | ICD-10-CM

## 2011-12-25 LAB — CBC WITH DIFFERENTIAL/PLATELET
Basophils Absolute: 0 10*3/uL (ref 0.0–0.1)
Eosinophils Absolute: 0.1 10*3/uL (ref 0.0–0.7)
MCHC: 33.3 g/dL (ref 30.0–36.0)
MCV: 93.7 fl (ref 78.0–100.0)
Monocytes Absolute: 0.3 10*3/uL (ref 0.1–1.0)
Neutrophils Relative %: 62.8 % (ref 43.0–77.0)
Platelets: 228 10*3/uL (ref 150.0–400.0)
WBC: 3.3 10*3/uL — ABNORMAL LOW (ref 4.5–10.5)

## 2011-12-25 LAB — LDL CHOLESTEROL, DIRECT: Direct LDL: 117.5 mg/dL

## 2011-12-25 LAB — COMPREHENSIVE METABOLIC PANEL
Alkaline Phosphatase: 42 U/L (ref 39–117)
Glucose, Bld: 87 mg/dL (ref 70–99)
Sodium: 142 mEq/L (ref 135–145)
Total Bilirubin: 0.9 mg/dL (ref 0.3–1.2)
Total Protein: 6.5 g/dL (ref 6.0–8.3)

## 2011-12-25 LAB — LIPID PANEL
HDL: 88.6 mg/dL (ref >39.00)
Triglycerides: 32 mg/dL (ref 0.0–149.0)
VLDL: 6.4 mg/dL (ref 0.0–40.0)

## 2011-12-25 LAB — HEPATIC FUNCTION PANEL
ALT: 19 U/L (ref 0–35)
AST: 23 U/L (ref 0–37)
Bilirubin, Direct: 0.1 mg/dL (ref 0.0–0.3)
Total Bilirubin: 0.9 mg/dL (ref 0.3–1.2)

## 2011-12-25 NOTE — Patient Instructions (Addendum)
Right lower quadrant abdominal fullness. On exam no evidence of hernia, good bowel sounds which rules out any intestinal obstruction, no guarding or rebound pain which rules out infection. Dr. Pennie Rushing reports a normal pelvic exam, although manual detection of ovaries on exam is always imperfect. Differential diagnosis - mild constipation vs intra-abdominal mass vs. Enlarged ovary.   Plan CT abdomen and pelvis to rule out intra-abdominal mass or enlarged ovary  Try a bulk laxative, e.g. Metamucil or benefiber, daily to facilitate an easy and regular bowel habit

## 2011-12-26 ENCOUNTER — Encounter: Payer: Self-pay | Admitting: Internal Medicine

## 2011-12-26 ENCOUNTER — Ambulatory Visit (INDEPENDENT_AMBULATORY_CARE_PROVIDER_SITE_OTHER)
Admission: RE | Admit: 2011-12-26 | Discharge: 2011-12-26 | Disposition: A | Discharge status: Home / Self Care | Payer: Medicare Other | Source: Ambulatory Visit | Attending: Internal Medicine | Admitting: Internal Medicine

## 2011-12-26 DIAGNOSIS — R1903 Right lower quadrant abdominal swelling, mass and lump: Secondary | ICD-10-CM

## 2011-12-26 MED ORDER — IOHEXOL 300 MG/ML  SOLN
100.0000 mL | Freq: Once | INTRAMUSCULAR | Status: AC | PRN
  Administered 2011-12-26: 100 mL via INTRAVENOUS

## 2011-12-28 NOTE — Progress Notes (Signed)
Subjective:    Patient ID: Wendy Serrano, female    DOB: 04/05/1946, 66 y.o.   MRN: [id number]  HPI Wendy Serrano presents for abdominal fullness right Lower quadrant. She does not have pain but is aware of appearance and mild sensation of fullness. She has seen Dr. Pennie Rushing and had a normal pelvic and bimanual exam. She reports a normal bowel habit without change in color, caliber and consistency. She has had no fever, sweats, chills or other systemic symptoms.  Past Medical History  Diagnosis Date  . Leukopenia     chronic  . 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 Allergic rhinitis   . Eczema     childhood  . Rotator cuff tear   . 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 H/O varicella   . History of measles, mumps, or rubella   . Yeast infection   . H/O osteopenia 01/09/04  . Menopausal symptoms   . H/O osteoporosis 2008  . 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 Shoulder arthroscopy 08/2009    Left shoulder-rotator cuff repair [name])  . Wisdom tooth extraction    Family History  Problem Relation Age of Onset  . Prostate cancer Father     died  . Pancreatic cancer Mother     died  . Diabetes Mother   . Hyperlipidemia Sister    History   Social History  . Marital Status: Married    Spouse Name: N/A    Number of Children: N/A  . Years of Education: N/A   Occupational History  . Retired    Social History Main Topics  . Smoking status: Never Smoker   . Smokeless tobacco: Never Used  . Alcohol Use: No  . Drug Use: No  . Sexually Active: No   Other Topics Concern  . Not on file   Social History Narrative   [employer]; MEd-teaching;MEd Administration @ [employer]ried '67Has 11 children who were foster care or mentoredRetiredSO retired, Has had some health problems. They are doing wellMarriage is in good health and retirement is goodEnd-of-life: provided packet of information  and forms for consideration (Oct'11)    Current Outpatient Prescriptions on File Prior to Visit  Medication Sig Dispense Refill  . calcium-vitamin D (OSCAL WITH D) 500-200 MG-UNIT per tablet Take 1 tablet by mouth 2 (two) times daily.        . cyclobenzaprine (FLEXERIL) 5 MG tablet Take 1 tablet (5 mg total) by mouth 3 (three) times daily as needed for muscle spasms.  30 tablet  0  . Estradiol-Norethindrone Acet (ACTIVELLA PO) Take by mouth. Take one tablet by mouth every other day       . Garlic TABS Take 4 tablets by mouth daily.        . Multiple Vitamin (MULTIVITAMIN) tablet Take 1 tablet by mouth daily.        . cyclobenzaprine (FLEXERIL) 5 MG tablet Take 1 tablet (5 mg total) by mouth every 8 (eight) hours as needed for muscle spasms.  30 tablet  1  . diclofenac (CATAFLAM) 50 MG tablet Take 1 tablet (50 mg total) by mouth 3 (three) times daily.  30 tablet  0  . estradiol (ESTRACE) 0.1 MG/GM vaginal cream Place 2 g vaginally once a week.        . valACYclovir (VALTREX) 1000 MG tablet 2 tablets AM and 2 tablets PM  x 1 day for HSV skin eruption  4 tablet  1      Review of Systems System review is negative for any constitutional, cardiac, pulmonary, GI or neuro symptoms or complaints other than as described in the HPI.     Objective:   Physical Exam Filed Vitals:   12/25/11 1207  BP: 100/68  Pulse: 86  Temp: 98.5 F (36.9 C)  Resp: 16   Gen'l- WNWD AA woman in no distress HEENT C&S clear Cor- RRR Pulm - normal respirations Abd - BS+ x 4, no guarding or rebound, no point tenderness, no palpable mass. Visible difference right vs left lower quadrants.       Assessment & Plan:  Abdominal bloating - mild distention but no other worrisome symptoms. Exam is unremarkable.  Plan Reassurance that exam is benign  CT abd/pelvis to r/o intrabdominal mass or abnormality.   Addendum: CT Abd/pelvis August 16, '13: IMPRESSION:  1. No evidence for acute abnormality in the abdomen  pelvis.  2. Small bilateral probable renal cysts.  3. Left hepatic lobe hemangioma.  4. Moderate stool burden.  Pateint called with results.

## 2011-12-30 ENCOUNTER — Telehealth: Payer: Self-pay | Admitting: *Deleted

## 2011-12-30 NOTE — Telephone Encounter (Signed)
Patient called reqeust a hard copy of results of her CT abdomen and pelvic  Test. Please advise

## 2011-12-30 NOTE — Telephone Encounter (Signed)
Ok to send a hard copy of report

## 2011-12-31 NOTE — Telephone Encounter (Signed)
Hard copy of test results mailed to patient.

## 2012-02-26 ENCOUNTER — Ambulatory Visit (INDEPENDENT_AMBULATORY_CARE_PROVIDER_SITE_OTHER): Payer: Medicare Other | Admitting: Obstetrics and Gynecology

## 2012-02-26 ENCOUNTER — Encounter: Payer: Self-pay | Admitting: Obstetrics and Gynecology

## 2012-02-26 VITALS — BP 100/60 | Ht 67.0 in

## 2012-02-26 DIAGNOSIS — Z124 Encounter for screening for malignant neoplasm of cervix: Secondary | ICD-10-CM

## 2012-02-26 DIAGNOSIS — N951 Menopausal and female climacteric states: Secondary | ICD-10-CM

## 2012-02-26 MED ORDER — ESTRADIOL-NORETHINDRONE ACET 1-0.5 MG PO TABS: 1.0000 | ORAL_TABLET | Freq: Every day | ORAL | Status: DC

## 2012-02-26 NOTE — Progress Notes (Signed)
ANNUAL:  Last Pap: 01/23/2009 WNL: Yes Regular Periods:no Contraception: Post-Menopause  Monthly Breast exam:no Tetanus<63yrs:yes Nl.Bladder Function:yes Daily BMs:yes Healthy Diet:yes Calcium:yes Mammogram:yes Date of Mammogram: 12/17/2011 Exercise:no Have often Exercise: n/a Seatbelt: yes Abuse at home: no Stressful work:no Sigmoid-colonoscopy: 2004 Bone Density: Yes pt believes she had this done last year at [facility] PCP: Dr. Debby Bud Change in PMH: None Change in [facility]: None  Subjective:    Wendy Serrano is a 66 y.o. female G0P0000 who presents for annual exam.  The patient has no complaints today except occassional hot flashes  The following portions of the patient's history were reviewed and updated as appropriate: allergies, current medications, past family history, past medical history, past social history, past surgical history and problem list.  Review of Systems Pelvic pain has resolved.  CT was neg Gastrointestinal:No change in bowel habits, no abdominal pain, no rectal bleeding Genitourinary:negative for dysuria, frequency, hematuria, nocturia and urinary incontinence    Objective:     Ht 5\' 7"  (1.702 m)  Weight:  Wt Readings from Last 1 Encounters:  12/25/11 134 lb (60.782 kg)     BMI: There is no weight on file to calculate BMI. General Appearance: Alert, appropriate appearance for age. No acute distress HEENT: Grossly normal Neck / Thyroid: Supple, no masses, nodes or enlargement Lungs: clear to auscultation bilaterally Back: No CVA tenderness Breast Exam: No masses or nodes.No dimpling, nipple retraction or discharge. Cardiovascular: Regular rate and rhythm. S1, S2, no murmur Gastrointestinal: Soft, non-tender, no masses or organomegaly Pelvic Exam: External genitalia: normal general appearance Vaginal: atrophic mucosa Cervix: normal appearance Adnexa: non palpable Uterus: normal single, nontender Rectovaginal: normal rectal, no masses Lymphatic  Exam: Non-palpable nodes in neck, clavicular, axillary, or inguinal regions Skin: no rash or abnormalities Neurologic: Normal gait and speech, no tremor  Psychiatric: Alert and oriented, appropriate affect.    Urinalysis:Not done    Assessment:    Menopause with recent mild increase in hot flashes   Plan:  Continue Activella.  Call is hot flashes become disruptive mammogram pap smear with HR HPV Follow-up:  for annual exam   Dierdre Forth MD

## 2012-03-01 ENCOUNTER — Encounter: Payer: Self-pay | Admitting: Internal Medicine

## 2012-03-01 ENCOUNTER — Ambulatory Visit (INDEPENDENT_AMBULATORY_CARE_PROVIDER_SITE_OTHER): Payer: Medicare Other | Admitting: Internal Medicine

## 2012-03-01 VITALS — BP 120/74 | HR 85 | Temp 98.4°F | Resp 16 | Wt 133.0 lb

## 2012-03-01 DIAGNOSIS — B009 Herpesviral infection, unspecified: Secondary | ICD-10-CM

## 2012-03-01 DIAGNOSIS — E789 Disorder of lipoprotein metabolism, unspecified: Secondary | ICD-10-CM

## 2012-03-01 DIAGNOSIS — Z0000 Encounter for general adult medical examination without abnormal findings: Secondary | ICD-10-CM

## 2012-03-01 DIAGNOSIS — Z23 Encounter for immunization: Secondary | ICD-10-CM

## 2012-03-01 DIAGNOSIS — M810 Age-related osteoporosis without current pathological fracture: Secondary | ICD-10-CM

## 2012-03-01 DIAGNOSIS — Z1211 Encounter for screening for malignant neoplasm of colon: Secondary | ICD-10-CM

## 2012-03-01 DIAGNOSIS — I8390 Asymptomatic varicose veins of unspecified lower extremity: Secondary | ICD-10-CM

## 2012-03-01 DIAGNOSIS — M25519 Pain in unspecified shoulder: Secondary | ICD-10-CM

## 2012-03-01 MED ORDER — HYDROCORTISONE VALERATE 0.2 % EX CREA: TOPICAL_CREAM | Freq: Two times a day (BID) | CUTANEOUS | Status: DC

## 2012-03-01 MED ORDER — VALACYCLOVIR HCL 1 G PO TABS: ORAL_TABLET | ORAL | Status: DC

## 2012-03-01 NOTE — Progress Notes (Signed)
Subjective:    Patient ID: Wendy Serrano, female    DOB: 20-Oct-1945, 66 y.o.   MRN: [id number]  HPI The patient is here for annual Medicare wellness examination and management of other chronic and acute problems.  In the last two weeks she has seen the ophthalmologist ( Dr. Mitzi Davenport) - normal exam; podiatry (Dr. Orland Jarred) - treated callus and advised on bunion; saw gyn - Dr. Pennie Rushing - normal exam.  Reviewed labs from August '13 - normal. CT abdomen/pelvis negative with incidental finding of left hepatic lobe hemangioma, small renal cysts.    The risk factors are reflected in the social history.  The roster of all physicians providing medical care to patient - is listed in the Snapshot section of the chart.  Activities of daily living:  The patient is 100% inedpendent in all ADLs: dressing, toileting, feeding as well as independent mobility  Home safety : The patient has smoke detectors in the home. They wear seatbelts. No firearms at home. There is no violence in the home.   There is no risks for hepatitis, STDs or HIV. There is no   history of blood transfusion. They have no travel history to infectious disease endemic areas of the world.  The patient seen their dentist in the last six month. They have seen their eye doctor in the last year. They deny any hearing difficulty and have not had audiologic testing in the last year.    They do not  have excessive sun exposure. Discussed the need for sun protection: hats, long sleeves and use of sunscreen if there is significant sun exposure.   Diet: the importance of a healthy diet is discussed. They do have a healthy diet.  The patient has no regular exercise program.  The benefits of regular aerobic exercise were discussed.  Depression screen: there are no signs or vegative symptoms of depression- irritability, change in appetite, anhedonia, sadness/tearfullness.  Cognitive assessment: the patient manages all their financial and  personal affairs and is actively engaged.   Vision, hearing, body mass index were assessed and reviewed.   During the course of the visit the patient was educated and counseled about appropriate screening and preventive services including : fall prevention , diabetes screening, nutrition counseling, colorectal cancer screening, and recommended immunizations.  Past Medical History  Diagnosis Date  . Leukopenia     chronic  . 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 Allergic rhinitis   . Eczema     childhood  . Rotator cuff tear   . 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 H/O varicella   . History of measles, mumps, or rubella   . Yeast infection   . H/O osteopenia 01/09/04  . Menopausal symptoms   . H/O osteoporosis 2008  . 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 Shoulder arthroscopy 08/2009    Left shoulder-rotator cuff repair [name])  . Wisdom tooth extraction    Family History  Problem Relation Age of Onset  . Prostate cancer Father     died  . Pancreatic cancer Mother     died  . Diabetes Mother   . Hyperlipidemia Sister    History   Social History  . Marital Status: Married    Spouse Name: N/A    Number of Children: N/A  . Years of Education: N/A   Occupational History  . Retired  Social History Main Topics  . Smoking status: Never Smoker   . Smokeless tobacco: Never Used  . Alcohol Use: No  . Drug Use: No  . Sexually Active: No   Other Topics Concern  . Not on file   Social History Narrative   [employer]; MEd-teaching;MEd Administration @ [employer]ried '67Has 11 children who were foster care or mentoredRetiredSO retired, Has had some health problems. They are doing wellMarriage is in good health and retirement is goodEnd-of-life: provided packet of information and forms for consideration (Oct'11)    Current Outpatient Prescriptions on File Prior to Visit    Medication Sig Dispense Refill  . calcium-vitamin D (OSCAL WITH D) 500-200 MG-UNIT per tablet Take 1 tablet by mouth 2 (two) times daily.        [name] estradiol-norethindrone (ACTIVELLA) 1-0.5 MG per tablet Take 1 tablet by mouth daily.  30 tablet  12  . Garlic TABS Take 4 tablets by mouth daily.        . Multiple Vitamin (MULTIVITAMIN) tablet Take 1 tablet by mouth daily.        [name] DISCONTD: Estradiol-Norethindrone Acet (ACTIVELLA PO) Take by mouth. Take one tablet by mouth every other day           Review of Systems Constitutional:  Negative for fever, chills, activity change and unexpected weight change.  HEENT:  Negative for hearing loss, ear pain, congestion, neck stiffness and postnasal drip. Negative for sore throat or swallowing problems. Negative for dental complaints.   Eyes: Negative for vision loss or change in visual acuity.  Respiratory: Negative for chest tightness and wheezing. Negative for DOE.   Cardiovascular: Negative for chest pain or palpitations. No decreased exercise tolerance Gastrointestinal: No change in bowel habit. No bloating or gas. No reflux or indigestion Genitourinary: Negative for urgency, frequency, flank pain and difficulty urinating.  Musculoskeletal: Negative for myalgias, back pain, arthralgias and gait problem.  Neurological: Negative for dizziness, tremors, weakness and headaches.  Hematological: Negative for adenopathy.  Psychiatric/Behavioral: Negative for behavioral problems and dysphoric mood.       Objective:   Physical Exam Filed Vitals:   03/01/12 1345  BP: 120/74  Pulse: 85  Temp: 98.4 F (36.9 C)  Resp: 16   Wt Readings from Last 3 Encounters:  03/01/12 133 lb (60.328 kg)  12/25/11 134 lb (60.782 kg)  11/11/11 137 lb (62.143 kg)   Gen'l: well nourished, well developed, elegantly dressed AA Woman in no distress HEENT - [place]/AT, EACs/TMs normal, oropharynx with native dentition in good condition except for missing premolar left  mandible, no buccal or palatal lesions, posterior pharynx clear, mucous membranes moist. C&S clear, PERRLA, fundi - normal Neck - supple, no thyromegaly Nodes- negative submental, cervical, supraclavicular regions Chest - no deformity, no CVAT Lungs - clear without rales, wheezes. No increased work of breathing Breast - deferred to gyn Cardiovascular - regular rate and rhythm, quiet precordium, no murmurs, rubs or gallops, 2+ radial, DP and PT pulses Abdomen - BS+ x 4, no HSM, no guarding or rebound or tenderness Pelvic - deferred to gyn Rectal - deferred to gyn Extremities - no clubbing, cyanosis, edema or deformity.  Neuro - A&O x 3, CN II-XII normal, motor strength normal and equal, DTRs 2+ and symmetrical biceps, radial, and patellar tendons. Cerebellar - no tremor, no rigidity, fluid movement and normal gait. Derm - Head, neck, back, abdomen and extremities without suspicious lesions  Lab Results  Component Value Date   WBC 3.3* 12/25/2011  HGB 13.4 12/25/2011   HCT 40.2 12/25/2011   PLT 228.0 12/25/2011   GLUCOSE 87 12/25/2011   CHOL 221* 12/25/2011   TRIG 32.0 12/25/2011   HDL 88.60 12/25/2011   LDLDIRECT 117.5 12/25/2011        ALT 19 12/25/2011   AST 23 12/25/2011        NA 142 12/25/2011   K 4.2 12/25/2011   CL 105 12/25/2011   CREATININE 0.8 12/25/2011   BUN 15 12/25/2011   CO2 30 12/25/2011   TSH 1.92 02/26/2011   HGBA1C 5.4 02/26/2011            Assessment & Plan:

## 2012-03-01 NOTE — Patient Instructions (Addendum)
Thanks for coming to see me. A full report is to follow. You are doing a great job of caring for your health.

## 2012-03-02 ENCOUNTER — Ambulatory Visit (INDEPENDENT_AMBULATORY_CARE_PROVIDER_SITE_OTHER)
Admission: RE | Admit: 2012-03-02 | Discharge: 2012-03-02 | Disposition: A | Discharge status: Home / Self Care | Payer: Medicare Other | Source: Ambulatory Visit

## 2012-03-02 DIAGNOSIS — M810 Age-related osteoporosis without current pathological fracture: Secondary | ICD-10-CM

## 2012-03-02 LAB — PAP IG AND HPV HIGH-RISK: HPV DNA High Risk: NOT DETECTED

## 2012-03-02 NOTE — Assessment & Plan Note (Addendum)
Interval history is without major illness, injury or surgery. Limited physical exam is normal. She is current with her gynecologist and with breast cancer screening. She will be due for follow up colonoscopy in January '14.  Immunizations are current. However, in line with latest CDC-ACIP recommendations she is a candidate for shingles vaccine.  Discussed varicose veins = spider veins and the Medicare approved recommendations for treatment. Also discussed facial cosmesis. Plan is to refer to Dr. Graylon Gunning at [facility] dermatology.  In summary - a delightful woman who appears to be medically stable. She is encouraged to resume a more active program of physical activity including flex/stretch/balance exercise.

## 2012-03-02 NOTE — Assessment & Plan Note (Signed)
Resolved problem with good range of motion at this time.

## 2012-03-02 NOTE — Assessment & Plan Note (Signed)
August '13 lab: LDL 117.5 HDL 88.6 - excellent and protective lipid profile with an LDL/HDL ratio of 1.3 = very protective.  Plan Continue healthy life-style.

## 2012-03-02 NOTE — Assessment & Plan Note (Signed)
Last DEXA bone scan February 21, 2010: T-scores: lumbar spine -0.1; left hip -2.4; right hip - 1.6. These scores put her in the osteopenia range.  Plan  Follow up DEXA scan with recommendations to follow  In general: she requires 1200 mg calcium daily (diet + supplement if needed), Vit D 800-1,000 iu daily, regular weight bearing exercise.

## 2012-03-02 NOTE — Assessment & Plan Note (Signed)
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 She has had good results using Valtrex 2,000 mg x 2 doses in 24 hrs.

## 2012-03-15 ENCOUNTER — Encounter: Payer: Self-pay | Admitting: Internal Medicine

## 2012-05-18 ENCOUNTER — Telehealth: Payer: Self-pay | Admitting: Internal Medicine

## 2012-05-18 NOTE — Telephone Encounter (Signed)
[facility] Triage Call Report Triage Record Num: [id number] Operator: Bennie Hind Patient Name: Wendy Serrano Call Date & Time: 05/15/2012 11:27:01AM Patient Phone: [phone] PCP: Illene Regulus Patient Gender: Female PCP Fax : [phone] Patient DOB: 05/13/1945 Practice Name: [facility] Reason for Call: Caller: Hope Pigeon; PCP: Illene Regulus (Adults only); CB#: [phone]; Call regarding Cough/Congestion; 05-15-12 onset about 3 days ago of congestion mainly head with some yellowish drainage once was blood tinged No fever. Very slight cough All emergent sxs per Upper Respiratory Infection ruled out Home care advice given Protocol(s) Used: Upper Respiratory Infection (URI) Recommended Outcome per Protocol: Provide Home/Self Care Reason for Outcome: New onset of two or more of the following symptoms: nasal congestion with runny nose; sneezing; itchy or mild sore throat; mild headache or body aches; mild fatigue; low grade fever up to 101.5 F (38.6C) usually lasting about a week Care Advice: ~ Use a cool mist humidifier to moisten air. Be sure to clean according to manufacturer's instructions. ~ Call provider if symptoms worsen or new symptoms develop. ~ Consider use of a saline nasal spray per package directions to help relieve nasal congestion. Most adults need to drink 6-10 eight-ounce glasses (1.2-2.0 liters) of fluids per day unless previously told to limit fluid intake for other medical reasons. Limit fluids that contain caffeine, sugar or alcohol. Urine will be a very light yellow color when you drink enough fluids.

## 2012-09-17 ENCOUNTER — Ambulatory Visit (INDEPENDENT_AMBULATORY_CARE_PROVIDER_SITE_OTHER): Payer: 59 | Admitting: Internal Medicine

## 2012-09-17 ENCOUNTER — Encounter: Payer: Self-pay | Admitting: Internal Medicine

## 2012-09-17 VITALS — BP 110/72 | HR 70 | Temp 98.1°F | Resp 16 | Wt 135.0 lb

## 2012-09-17 DIAGNOSIS — M79644 Pain in right finger(s): Secondary | ICD-10-CM

## 2012-09-17 DIAGNOSIS — M79609 Pain in unspecified limb: Secondary | ICD-10-CM

## 2012-09-17 DIAGNOSIS — M79646 Pain in unspecified finger(s): Secondary | ICD-10-CM | POA: Insufficient documentation

## 2012-09-17 NOTE — Patient Instructions (Addendum)
Aleve 1 a day

## 2012-09-17 NOTE — Progress Notes (Signed)
  Subjective:    Patient ID: Wendy Serrano, female    DOB: 19-Apr-1946, 67 y.o.   MRN: [id number]  HPI  C/o sudden onset L 5th finger dist DIP joint  Pain 1 wk ago. It started in the shower. It turned swollen, blue and tender at once.  Review of Systems  Constitutional: Negative for fever and fatigue.  Gastrointestinal: Negative for blood in stool.       Objective:   Physical Exam  Constitutional: She appears well-developed. No distress.  Musculoskeletal:  R 5th  Finger DIP dorsal side is darker and swollen 8 mm  Skin: No rash noted. No erythema. No pallor.          Assessment & Plan:

## 2012-09-17 NOTE — Assessment & Plan Note (Addendum)
C/o sudden onset L 5th finger dist DIP joint  1 wk ago - hematoma (?) Aleve 1  Day X ray finger Dressed w/bandaid for protection

## 2012-09-27 ENCOUNTER — Ambulatory Visit (INDEPENDENT_AMBULATORY_CARE_PROVIDER_SITE_OTHER)
Admission: RE | Admit: 2012-09-27 | Discharge: 2012-09-27 | Disposition: A | Discharge status: Home / Self Care | Payer: 59 | Source: Ambulatory Visit | Attending: Internal Medicine | Admitting: Internal Medicine

## 2012-09-27 ENCOUNTER — Telehealth: Payer: Self-pay | Admitting: Internal Medicine

## 2012-09-27 DIAGNOSIS — M79609 Pain in unspecified limb: Secondary | ICD-10-CM

## 2012-09-27 DIAGNOSIS — M79644 Pain in right finger(s): Secondary | ICD-10-CM

## 2012-09-27 DIAGNOSIS — S62639A Displaced fracture of distal phalanx of unspecified finger, initial encounter for closed fracture: Secondary | ICD-10-CM

## 2012-09-27 NOTE — Telephone Encounter (Signed)
Wendy Serrano, please, inform patient that her finger x ray reveals a small avulsion fx. She needs to see ortho. I'll ref Thx

## 2012-09-28 NOTE — Telephone Encounter (Signed)
Pt informed

## 2012-11-01 ENCOUNTER — Telehealth: Payer: Self-pay | Admitting: Internal Medicine

## 2012-11-01 ENCOUNTER — Other Ambulatory Visit: Payer: Self-pay

## 2012-11-01 DIAGNOSIS — Z1231 Encounter for screening mammogram for malignant neoplasm of breast: Secondary | ICD-10-CM

## 2012-11-01 NOTE — Telephone Encounter (Deleted)
Patient states she has had a cold x 5 days.  She has been taking Alka Seltzer Plus Daytime and Nighttime.  She stopped taking because she was concerned about the sodium.  She is seeing yellow drainage.  She wants an antibiotic.

## 2012-11-02 ENCOUNTER — Ambulatory Visit (INDEPENDENT_AMBULATORY_CARE_PROVIDER_SITE_OTHER): Payer: 59 | Admitting: Internal Medicine

## 2012-11-02 ENCOUNTER — Ambulatory Visit: Payer: 59 | Admitting: Internal Medicine

## 2012-11-02 ENCOUNTER — Encounter: Payer: Self-pay | Admitting: Internal Medicine

## 2012-11-02 VITALS — BP 110/80 | HR 85 | Temp 97.6°F | Ht 67.0 in | Wt 139.2 lb

## 2012-11-02 DIAGNOSIS — J309 Allergic rhinitis, unspecified: Secondary | ICD-10-CM

## 2012-11-02 DIAGNOSIS — E789 Disorder of lipoprotein metabolism, unspecified: Secondary | ICD-10-CM

## 2012-11-02 DIAGNOSIS — J069 Acute upper respiratory infection, unspecified: Secondary | ICD-10-CM | POA: Insufficient documentation

## 2012-11-02 MED ORDER — HYDROCODONE-HOMATROPINE 5-1.5 MG/5ML PO SYRP
5.0000 mL | ORAL_SOLUTION | Freq: Four times a day (QID) | ORAL | Status: DC | PRN
Start: 2012-11-02 — End: 2013-03-09

## 2012-11-02 MED ORDER — AZITHROMYCIN 250 MG PO TABS: ORAL_TABLET | ORAL | Status: DC

## 2012-11-02 NOTE — Patient Instructions (Signed)
Please take all new medication as prescribed - the antibiotic, and cough medicine Please continue all other medications as before Please have the pharmacy call with any other refills you may need. You can also take Claritin for allergies, and/or Mucinex (or it's generic off brand) for congestion, and tylenol as needed for pain. Please continue your efforts at being more active, low cholesterol diet, and weight control.  Please remember to sign up for My Chart if you have not done so, as this will be important to you in the future with finding out test results, communicating by private email, and scheduling acute appointments online when needed.

## 2012-11-02 NOTE — Progress Notes (Addendum)
Subjective:    Patient ID: Wendy Serrano, female    DOB: 09-11-1945, 67 y.o.   MRN: [id number]  HPI   Here with 2-3 days acute onset fever, facial pain, pressure, headache, general weakness and malaise, and greenish d/c, with mild ST and cough, but pt denies chest pain, wheezing, increased sob or doe, orthopnea, PND, increased LE swelling, palpitations, dizziness or syncope. . Does have several wks ongoing nasal allergy symptoms with clearish congestion, itch and sneezing, without fever, pain, ST, cough, swelling or wheezing.  Past Medical History  Diagnosis Date  . Leukopenia     chronic  . 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 Allergic rhinitis   . Eczema     childhood  . Rotator cuff tear   . 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 H/O varicella   . History of measles, mumps, or rubella   . Yeast infection   . H/O osteopenia 01/09/04  . Menopausal symptoms   . H/O osteoporosis 2008  . 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 Shoulder arthroscopy  08/2009    Left shoulder-rotator cuff repair [name])  . Wisdom tooth extraction      reports that she has never smoked. She has never used smokeless tobacco. She reports that she does not drink alcohol or use illicit drugs. family history includes Diabetes in her mother; Hyperlipidemia in her sister; Pancreatic cancer in her mother; and Prostate cancer in her father. No Known Allergies Current Outpatient Prescriptions on File Prior to Visit  Medication Sig Dispense Refill  . calcium-vitamin D (OSCAL WITH D) 500-200 MG-UNIT per tablet Take 1 tablet by mouth 2 (two) times daily.        [name] estradiol-norethindrone (ACTIVELLA) 1-0.5 MG per tablet Take 1 tablet by mouth daily.  30 tablet  12  . Garlic TABS Take 4 tablets by mouth daily.        . hydrocortisone valerate cream (WESTCORT) 0.2 % Apply topically 2 (two) times daily.  45 g  2  . Multiple  Vitamin (MULTIVITAMIN) tablet Take 1 tablet by mouth daily.        . Multiple Vitamins-Minerals (OCUVITE PO) Take 1 tablet by mouth daily.      . valACYclovir (VALTREX) 1000 MG tablet 2 tablets AM and 2 tablets PM x 1 day for HSV skin eruption  4 tablet  1   No current facility-administered medications on file prior to visit.   Review of Systems  Constitutional: Negative for unexpected weight change, or unusual diaphoresis  HENT: Negative for tinnitus.   Eyes: Negative for photophobia and visual disturbance.  Respiratory: Negative for choking and stridor.   Gastrointestinal: Negative for vomiting and blood in stool.  Genitourinary: Negative for hematuria and decreased urine volume.  Musculoskeletal: Negative for acute joint swelling Skin: Negative for color change and wound.  Neurological: Negative for tremors and numbness other than noted  Psychiatric/Behavioral: Negative for decreased concentration or  hyperactivity.       Objective:   Physical Exam BP 110/80  Pulse 85  Temp(Src) 97.6 F (36.4 C) (Oral)  Ht 5\' 7"  (1.702 m)  Wt 139 lb 4 oz (63.163 kg)  BMI 21.8 kg/m2  SpO2 98% VS noted,mild ill Constitutional: Pt appears well-developed and well-nourished.  HENT: Head: NCAT.  Right Ear: External ear normal.  Left Ear: External ear normal.  Bilat tm's with mild erythema.  Max sinus areas mild tender.  Pharynx with mild erythema, no exudate Eyes: Conjunctivae and EOM are normal. Pupils are equal, round, and reactive to light.  Neck: Normal range of motion. Neck supple.  Cardiovascular: Normal rate and regular rhythm.   Pulmonary/Chest: Effort normal and breath sounds normal.  Neurological: Pt is alert. Not confused  Skin: Skin is warm. No erythema.  Psychiatric: Pt behavior is normal. Thought content normal.     Assessment & Plan:

## 2012-11-03 NOTE — Assessment & Plan Note (Signed)
Pt requests lipid review,  Lab Results  Component Value Date   CHOL 221* 12/25/2011   HDL 88.60 12/25/2011   LDLDIRECT 117.5 12/25/2011   TRIG 32.0 12/25/2011   CHOLHDL 2 12/25/2011   delcines further tx, for lower chol diet

## 2012-11-03 NOTE — Assessment & Plan Note (Signed)
Pt prefers claritin and mucinex otc prn allergy symptoms

## 2012-11-03 NOTE — Assessment & Plan Note (Signed)
Mild to mod, for antibx course,  to f/u any worsening symptoms or concerns 

## 2012-11-30 NOTE — Telephone Encounter (Signed)
Opened in error

## 2012-12-10 DIAGNOSIS — R9230 Dense breasts, unspecified: Secondary | ICD-10-CM | POA: Insufficient documentation

## 2012-12-13 ENCOUNTER — Ambulatory Visit
Admission: RE | Admit: 2012-12-13 | Discharge: 2012-12-13 | Disposition: A | Discharge status: Home / Self Care | Payer: Medicare Other | Source: Ambulatory Visit

## 2012-12-13 DIAGNOSIS — Z1231 Encounter for screening mammogram for malignant neoplasm of breast: Secondary | ICD-10-CM

## 2013-03-09 ENCOUNTER — Encounter: Payer: Self-pay | Admitting: Internal Medicine

## 2013-03-09 ENCOUNTER — Other Ambulatory Visit (INDEPENDENT_AMBULATORY_CARE_PROVIDER_SITE_OTHER): Payer: Medicare Other

## 2013-03-09 ENCOUNTER — Ambulatory Visit (INDEPENDENT_AMBULATORY_CARE_PROVIDER_SITE_OTHER): Payer: Medicare Other | Admitting: Internal Medicine

## 2013-03-09 VITALS — BP 132/80 | HR 97 | Temp 97.9°F | Ht 67.5 in | Wt 136.0 lb

## 2013-03-09 DIAGNOSIS — Z0000 Encounter for general adult medical examination without abnormal findings: Secondary | ICD-10-CM

## 2013-03-09 DIAGNOSIS — I739 Peripheral vascular disease, unspecified: Secondary | ICD-10-CM

## 2013-03-09 DIAGNOSIS — B009 Herpesviral infection, unspecified: Secondary | ICD-10-CM

## 2013-03-09 DIAGNOSIS — Z23 Encounter for immunization: Secondary | ICD-10-CM

## 2013-03-09 DIAGNOSIS — E789 Disorder of lipoprotein metabolism, unspecified: Secondary | ICD-10-CM

## 2013-03-09 DIAGNOSIS — E7800 Pure hypercholesterolemia, unspecified: Secondary | ICD-10-CM

## 2013-03-09 DIAGNOSIS — Z1211 Encounter for screening for malignant neoplasm of colon: Secondary | ICD-10-CM

## 2013-03-09 DIAGNOSIS — M810 Age-related osteoporosis without current pathological fracture: Secondary | ICD-10-CM

## 2013-03-09 DIAGNOSIS — J309 Allergic rhinitis, unspecified: Secondary | ICD-10-CM

## 2013-03-09 LAB — COMPREHENSIVE METABOLIC PANEL
Albumin: 4.2 g/dL (ref 3.5–5.2)
Alkaline Phosphatase: 43 U/L (ref 39–117)
BUN: 19 mg/dL (ref 6–23)
CO2: 28 mEq/L (ref 19–32)
GFR: 89.01 mL/min (ref >60.00)
Glucose, Bld: 89 mg/dL (ref 70–99)
Total Bilirubin: 0.9 mg/dL (ref 0.3–1.2)
Total Protein: 6.9 g/dL (ref 6.0–8.3)

## 2013-03-09 LAB — LIPID PANEL
Cholesterol: 231 mg/dL — ABNORMAL HIGH (ref 0–200)
HDL: 92 mg/dL (ref >39.00)
Total CHOL/HDL Ratio: 3
Triglycerides: 26 mg/dL (ref 0.0–149.0)
VLDL: 5.2 mg/dL (ref 0.0–40.0)

## 2013-03-09 LAB — LDL CHOLESTEROL, DIRECT: Direct LDL: 127.9 mg/dL

## 2013-03-09 MED ORDER — FISH OIL 1200 MG PO CAPS: 1.0000 | ORAL_CAPSULE | Freq: Every day | ORAL | Status: DC

## 2013-03-09 NOTE — Progress Notes (Signed)
Subjective:    Patient ID: Wendy Serrano, female    DOB: 10/14/1945, 67 y.o.   MRN: [id number]  HPI The patient is here for annual Medicare wellness examination and management of other chronic and acute problems.  She is generally feeling well. She does have a comedone on the left back. She has a buzzing sound in the right ear for about a week or so. She does have minor allergic rhinitis and has a tickle in her throat.   She is scheduled to see Gyn on Nov 18th  Reviewed CT abd/pelvis from Aug '13: negative study - left hepatic lobe hemangioma, minor renal cysts.    The risk factors are reflected in the social history.  The roster of all physicians providing medical care to patient - is listed in the Snapshot section of the chart.  Activities of daily living:  The patient is 100% inedpendent in all ADLs: dressing, toileting, feeding as well as independent mobility  Home safety : The patient has smoke detectors in the home. They wear seatbelts. No firearms at home . There is no violence in the home.   There is no risks for hepatitis, STDs or HIV. There is no   history of blood transfusion. They have no travel history to infectious disease endemic areas of the world.  The patient has seen their dentist in the last six month. They have seen their eye doctor in the last year. They deny any hearing difficulty and have not had audiologic testing in the last year.    They do not  have excessive sun exposure. Discussed the need for sun protection: hats, long sleeves and use of sunscreen if there is significant sun exposure.   Diet: the importance of a healthy diet is discussed. They do have a healthy diet.  The patient has no regular exercise program.  The benefits of regular aerobic exercise were discussed.  Depression screen: there are no signs or vegative symptoms of depression- irritability, change in appetite, anhedonia, sadness/tearfullness.  Cognitive assessment: the patient manages  all their financial and personal affairs and is actively engaged.   The following portions of the patient's history were reviewed and updated as appropriate: allergies, current medications, past family history, past medical history,  past surgical history, past social history  and problem list.  Vision, hearing, body mass index were assessed and reviewed.   During the course of the visit the patient was educated and counseled about appropriate screening and preventive services including : fall prevention , diabetes screening, nutrition counseling, colorectal cancer screening, and recommended immunizations.  Past Medical History  Diagnosis Date  . Leukopenia     chronic  . 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 Allergic rhinitis   . Eczema     childhood  . Rotator cuff tear   . 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 H/O varicella   . History of measles, mumps, or rubella   . Yeast infection   . H/O osteopenia 01/09/04  . Menopausal symptoms   . H/O osteoporosis 2008  . 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 Shoulder arthroscopy  08/2009    Left shoulder-rotator cuff repair [name])  . Wisdom tooth extraction     Family History  Problem Relation Age of Onset  . Prostate cancer Father     died  . Pancreatic cancer Mother  died  . Diabetes Mother   . Hyperlipidemia Sister    History   Social History  . Marital Status: Married    Spouse Name: N/A    Number of Children: N/A  . Years of Education: N/A   Occupational History  . Retired    Social History Main Topics  . Smoking status: Never Smoker   . Smokeless tobacco: Never Used  . Alcohol Use: No  . Drug Use: No  . Sexual Activity: No   Other Topics Concern  . Not on file   Social History Narrative   [employer] BA; MEd-teaching;MEd Administration @ [employer]   Married '67   Has 11 children who were foster care or  mentored   Retired   SO retired, Has had some health problems. They are doing well   Marriage is in good health and retirement is good   End-of-life: provided packet of information and forms for consideration (Oct'11)    Current Outpatient Prescriptions on File Prior to Visit  Medication Sig Dispense Refill  . calcium-vitamin D (OSCAL WITH D) 500-200 MG-UNIT per tablet Take 1 tablet by mouth 2 (two) times daily.        [name] estradiol-norethindrone (ACTIVELLA) 1-0.5 MG per tablet Take 1 tablet by mouth daily.  30 tablet  12  . Garlic TABS Take 4 tablets by mouth daily. Kyolic      . hydrocortisone valerate cream (WESTCORT) 0.2 % Apply topically 2 (two) times daily.  45 g  2  . Multiple Vitamin (MULTIVITAMIN) tablet Take 1 tablet by mouth daily.        . Multiple Vitamins-Minerals (OCUVITE PO) Take 1 tablet by mouth daily.      . valACYclovir (VALTREX) 1000 MG tablet 2 tablets AM and 2 tablets PM x 1 day for HSV skin eruption  4 tablet  1   No current facility-administered medications on file prior to visit.       Review of Systems Constitutional:  Negative for fever, chills, activity change and unexpected weight change.  HEENT:  Negative for hearing loss, ear pain, congestion, neck stiffness and postnasal drip. Negative for sore throat or swallowing problems. Negative for dental complaints.   Eyes: Negative for vision loss or change in visual acuity.  Respiratory: Negative for chest tightness and wheezing. Negative for DOE.   Cardiovascular: Negative for chest pain or palpitations. No decreased exercise tolerance Gastrointestinal: No change in bowel habit. No bloating or gas. No reflux or indigestion Genitourinary: Negative for urgency, frequency, flank pain and difficulty urinating.  Musculoskeletal: Negative for myalgias, back pain, arthralgias and gait problem.  Neurological: Negative for dizziness, tremors, weakness and headaches.  Hematological: Negative for adenopathy.   Psychiatric/Behavioral: Negative for behavioral problems and dysphoric mood.       Objective:   Physical Exam Filed Vitals:   03/09/13 0911  BP: 132/80  Pulse: 97  Temp: 97.9 F (36.6 C)   Wt Readings from Last 3 Encounters:  03/09/13 136 lb (61.689 kg)  11/02/12 139 lb 4 oz (63.163 kg)  09/17/12 135 lb (61.236 kg)   Gen'l: well nourished, well developed Woman in no distress HEENT - [place]/AT, EACs/TMs normal, oropharynx with native dentition in good condition, no buccal or palatal lesions, posterior pharynx clear, mucous membranes moist. C&S clear, PERRLA, fundi - normal Neck - supple, no thyromegaly Nodes- negative submental, cervical, supraclavicular regions Chest - no deformity, no CVAT Lungs - clear without rales, wheezes. No increased work of breathing Breast - deferred to  gyn Cardiovascular - regular rate and rhythm, quiet precordium, no murmurs, rubs or gallops, 2+ radial, DP and PT pulses Abdomen - BS+ x 4, no HSM, no guarding or rebound or tenderness Pelvic - deferred to gyn Rectal - deferred to gyn Extremities - no clubbing, cyanosis, edema or deformity.  Neuro - A&O x 3, CN II-XII normal, motor strength normal and equal, DTRs 2+ and symmetrical biceps, radial, and patellar tendons. Cerebellar - no tremor, no rigidity, fluid movement and normal gait. Derm - Head, neck, back, abdomen and extremities without suspicious lesions. Large Comedone upper back medial to left scapula.  Recent Results (from the past 2160 hour(s))  COMPREHENSIVE METABOLIC PANEL     Status: None   Collection Time    03/09/13 10:34 AM      Result Value Range   Sodium 140  135 - 145 mEq/L   Potassium 3.6  3.5 - 5.1 mEq/L   Chloride 105  96 - 112 mEq/L   CO2 28  19 - 32 mEq/L   Glucose, Bld 89  70 - 99 mg/dL   BUN 19  6 - 23 mg/dL   Creatinine, Ser 0.8  0.4 - 1.2 mg/dL   Total Bilirubin 0.9  0.3 - 1.2 mg/dL   Alkaline Phosphatase 43  39 - 117 U/L   AST 23  0 - 37 U/L   ALT 19  0 - 35 U/L    Total Protein 6.9  6.0 - 8.3 g/dL   Albumin 4.2  3.5 - 5.2 g/dL   Calcium 9.4  8.4 - 46.9 mg/dL   GFR 62.95  >28.41 mL/min  LIPID PANEL     Status: Abnormal   Collection Time    03/09/13 10:34 AM      Result Value Range   Cholesterol 231 (*) 0 - 200 mg/dL   Comment: ATP III Classification       Desirable:  < 200 mg/dL               Borderline High:  200 - 239 mg/dL          High:  > = 324 mg/dL   Triglycerides 40.1  0.0 - 149.0 mg/dL   Comment: Normal:  <027 mg/dLBorderline High:  150 - 199 mg/dL   HDL 25.36  >64.40 mg/dL   VLDL 5.2  0.0 - 34.7 mg/dL   Total CHOL/HDL Ratio 3     Comment:                Men          Women1/2 Average Risk     3.4          3.3Average Risk          5.0          4.42X Average Risk          9.6          7.13X Average Risk          15.0          11.0                      LDL CHOLESTEROL, DIRECT     Status: None   Collection Time    03/09/13 10:34 AM      Result Value Range   Direct LDL 127.9     Comment: Optimal:  <100 mg/dLNear or Above Optimal:  100-129 mg/dLBorderline High:  130-159 mg/dLHigh:  160-189 mg/dLVery  High:  >190 mg/dL    Extrusion of comedone: lesion prepped with betadine.    Using a comedone extractor the contents were expressed.    bandaide applied and routine instructions provided.     Assessment & Plan:

## 2013-03-09 NOTE — Patient Instructions (Signed)
Good to see you. You look very well.  Your exam was normal. The site of the comedone needs no treatment but watch for any inflammation.  Lab today - you will receive results by MyChart  Please come back in 2+ weeks for Prevnar - pneumonia vaccine.  Give my best personal regards to Dr. Pennie Rushing

## 2013-03-10 NOTE — Assessment & Plan Note (Signed)
Last Bone scan in October '13 with no change from 2011. Osteopenia at the left femoral neck, otherwise normal.

## 2013-03-10 NOTE — Assessment & Plan Note (Signed)
Having a minor problem during this fall season.  Plan otc claritin as needed

## 2013-03-10 NOTE — Assessment & Plan Note (Signed)
Interval history is unremarkable. Physical exam is normal. She is current with Dr. Pennie Rushing for Gyn. Lab results are in normal range. She is current with colorectal and breast cancer screening. Immunizations are up to date.  In summary  A very nice woman who appears to be very healthy. She will return as needed or in 1 year.

## 2013-03-10 NOTE — Assessment & Plan Note (Signed)
No recent outbreaks. Keeps valtrex on hand to use as needed

## 2013-03-10 NOTE — Assessment & Plan Note (Signed)
Lipid panel with slightly higher LDL but still better than goal for low risk patient, HDL is very robust with an LDL/HDL 1.5  Plan No indication for any treatment  Dietary vigilance.

## 2013-03-23 ENCOUNTER — Ambulatory Visit (INDEPENDENT_AMBULATORY_CARE_PROVIDER_SITE_OTHER): Payer: Medicare Other

## 2013-03-23 DIAGNOSIS — Z23 Encounter for immunization: Secondary | ICD-10-CM

## 2013-04-19 ENCOUNTER — Telehealth: Payer: Self-pay | Admitting: *Deleted

## 2013-04-19 NOTE — Telephone Encounter (Signed)
Pt called states she is concerned about the abdominal discomfort she is experiencing.  Pt has appoint scheduled 12.10.14 with Dr Debby Bud.  Please advise

## 2013-04-19 NOTE — Telephone Encounter (Signed)
Full schedule today. Does she feel she can wait until her appointment tomorrow? If more acute, can't wait, I can see her late today - 6PM I am willing to work that late if it is urgent.

## 2013-04-19 NOTE — Telephone Encounter (Signed)
Pt will wait til appointment 12.10.14.

## 2013-04-20 ENCOUNTER — Ambulatory Visit (INDEPENDENT_AMBULATORY_CARE_PROVIDER_SITE_OTHER): Payer: Medicare Other | Admitting: Internal Medicine

## 2013-04-20 VITALS — BP 120/76 | HR 89 | Temp 98.2°F | Wt 137.6 lb

## 2013-04-20 DIAGNOSIS — R109 Unspecified abdominal pain: Secondary | ICD-10-CM | POA: Insufficient documentation

## 2013-04-20 NOTE — Progress Notes (Signed)
Pre visit review using our clinic review tool, if applicable. No additional management support is needed unless otherwise documented below in the visit note. 

## 2013-04-20 NOTE — Patient Instructions (Signed)
Good to see you.  The review of your CT scan (attached), the labs from Oct '14, Dr. Lilian Coma note from Oct '13 and your report of her exam this November are all negative for any identifiable problem. Your exam of the abdomen today is unremarkable.  Cardinal signs: dramatic weight loss, fever, dramatic change in energy, enlarged lymph nodes, change in the color or caliber of the stool, a dramatic change in bowel habit, a new intolerance of fatty foods and of course progressive pain are all signs that may indicate an underlying problem.  Plan Colonoscopy as scheduled  If after a normal colonoscopy you are still having discomfort we will repeat the CT scan of the abdomen and pelvis with IV contrast.   Have a wonder filled Holiday.

## 2013-04-20 NOTE — Progress Notes (Signed)
   Subjective:    Patient ID: Wendy Serrano, female    DOB: 12-Sep-1945, 67 y.o.   MRN: [id number]  HPI Wendy Serrano presents for follow up of a pulled feeling or pain in the RLQ. This has been persistent for several months. She had a CT in August '13 - negative study. She did see Wendy Serrano in November and had a negative exam.   Review of Systems     Objective:   Physical Exam        Assessment & Plan:

## 2013-04-21 NOTE — Assessment & Plan Note (Signed)
Reviewed CT scan (attached), the labs from Oct '14, Dr. Lilian Coma note from Oct '13 and patient's report of her exam this November are all negative for any identifiable problem. Exam of the abdomen today is unremarkable.  Cardinal signs: dramatic weight loss, fever, dramatic change in energy, enlarged lymph nodes, change in the color or caliber of the stool, a dramatic change in bowel habit, a new intolerance of fatty foods and of course progressive pain are all signs that may indicate an underlying problem - all negative at this time  Plan Colonoscopy as scheduled  If after a normal colonoscopy she is still having discomfort we will repeat the CT scan of the abdomen and pelvis with IV contrast.  Offered reassurance that there is no identified problem of significance at this time.

## 2013-04-25 ENCOUNTER — Telehealth: Payer: Self-pay | Admitting: *Deleted

## 2013-04-25 NOTE — Telephone Encounter (Signed)
Spoke with pt advised of MDs message.  She states she is going to try this for a few days.

## 2013-04-25 NOTE — Telephone Encounter (Signed)
May add on to tomorrow's schedule.  For now: 1. Behind the counter sudafed 30 mg AM, Noon and if needed bedtime  2. Nasal saline spray every hour while awake or nettie pot every 2-3 hours.

## 2013-04-25 NOTE — Telephone Encounter (Signed)
Pt called states this morning she woke up feeling "headachey" and spitting up blood.  Please advise

## 2013-04-28 ENCOUNTER — Encounter: Payer: Self-pay | Admitting: Nurse Practitioner

## 2013-04-28 ENCOUNTER — Ambulatory Visit (INDEPENDENT_AMBULATORY_CARE_PROVIDER_SITE_OTHER): Payer: Medicare Other | Admitting: Nurse Practitioner

## 2013-04-28 VITALS — BP 130/74 | HR 96 | Ht 66.25 in | Wt 136.4 lb

## 2013-04-28 DIAGNOSIS — G8929 Other chronic pain: Secondary | ICD-10-CM | POA: Insufficient documentation

## 2013-04-28 DIAGNOSIS — R194 Change in bowel habit: Secondary | ICD-10-CM

## 2013-04-28 DIAGNOSIS — Z1211 Encounter for screening for malignant neoplasm of colon: Secondary | ICD-10-CM

## 2013-04-28 DIAGNOSIS — R109 Unspecified abdominal pain: Secondary | ICD-10-CM

## 2013-04-28 DIAGNOSIS — R1031 Right lower quadrant pain: Secondary | ICD-10-CM

## 2013-04-28 DIAGNOSIS — R198 Other specified symptoms and signs involving the digestive system and abdomen: Secondary | ICD-10-CM

## 2013-04-28 NOTE — Patient Instructions (Addendum)
You have been scheduled for a colonoscopy with propofol. Please follow written instructions given to you at your visit today.  Please use the prep given to you today. If you use inhalers (even only as needed), please bring them with you on the day of your procedure. Your physician has requested that you go to www.[email or web address].com and enter the access code given to you at your visit today. This web site gives a general overview about your procedure. However, you should still follow specific instructions given to you by our office regarding your preparation for the procedure.

## 2013-04-28 NOTE — Progress Notes (Signed)
HPI :   Patient is a 67 year old female, new to this practice, referred for evaluation of RLQ pain. In July of last year patient saw GYN with right groin pain. Her pain was felt to be a muscle strain, she was prescribed flexeril. In August she saw PCP with RLQ fullness. CTscan was ordered and unrevealing except for stool throughout the colon. Pain subsided until September of this year. Pain is intermittent, unrelated to meals, defecation or activity. Patient hesitant to refer to it as pain but rather an awareness of something not feeling right in RLQ. Her weight is stable. No associated bowel changes except for just this last week when patient had a loose stool. No history of rectal bleeding. No history of abdominal surgeries to suggest adhesive disease. [place] and apparently normal. No FMH of colon cancer. CMET 03/09/13 was unremarkable.   Past Medical History  Diagnosis Date  . Leukopenia     chronic  . 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 Allergic rhinitis   . Eczema     childhood  . Rotator cuff tear   . 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 H/O varicella   . History of measles, mumps, or rubella   . Yeast infection   . H/O osteopenia 01/09/04  . Menopausal symptoms   . H/O osteoporosis 2008  . 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 Renal cyst     bilateral  . Liver hemangioma    Family History  Problem Relation Age of Onset  . Prostate cancer Father     died  . Pancreatic cancer Mother     died  . Diabetes Mother   . Hyperlipidemia Sister    History  Substance Use Topics  . Smoking status: Never Smoker   . Smokeless tobacco: Never Used  . Alcohol Use: No   Current Outpatient Prescriptions  Medication Sig Dispense Refill  . calcium-vitamin D (OSCAL WITH D) 500-200 MG-UNIT per tablet Take 1 tablet by mouth 2 (two) times daily.        [name] estradiol-norethindrone (ACTIVELLA) 1-0.5  MG per tablet Take 1 tablet by mouth daily.  30 tablet  12  . Garlic TABS Take 4 tablets by mouth daily. Kyolic      . hydrocortisone valerate cream (WESTCORT) 0.2 % Apply topically 2 (two) times daily.  45 g  2  . Multiple Vitamin (MULTIVITAMIN) tablet Take 1 tablet by mouth daily.        . Multiple Vitamins-Minerals (OCUVITE PO) Take 1 tablet by mouth daily.      . Omega-3 Fatty Acids (FISH OIL) 1200 MG CAPS Take 1 capsule (1,200 mg total) by mouth daily.      . valACYclovir (VALTREX) 1000 MG tablet 2 tablets AM and 2 tablets PM x 1 day for HSV skin eruption  4 tablet  1   No current facility-administered medications for this visit.   No Known Allergies  Review of Systems: Positive for night sweat. All other systems reviewed and negative except where noted in HPI.   Physical Exam: BP 130/74  Pulse 96  Ht 5' 6.25" (1.683 m)  Wt 136 lb 6 oz (61.859 kg)  BMI 21.84 kg/m2 Constitutional: Pleasant,well-developed, black female in no acute distress. HEENT: Normocephalic and atraumatic. Conjunctivae are normal. No scleral icterus. Neck supple.  Cardiovascular: Normal rate, regular rhythm.  Pulmonary/chest:  Effort normal and breath sounds normal. No wheezing, rales or rhonchi. Abdominal: Soft, mildly distended with tympany, active bowel sounds. Nontender. There are no masses palpable. No hepatomegaly. Negative Carnett's sign Extremities: no edema Lymphadenopathy: No cervical adenopathy noted. Neurological: Alert and oriented to person place and time. Skin: Skin is warm and dry. No rashes noted. Psychiatric: Normal mood and affect. Behavior is normal.   ASSESSMENT AND PLAN:  38. 67 year old female with chronic intermittent RLQ pain. Pain has no aggravating or alleviating factors. It is reassuring that a contrast CTscan of abdomen / pelvis for this pain August 2013 was unremarkable except for stool filled colon. Patient slightly concerned about possibility of cancer.  This may be   musculoskeletal pain. Gastrointestinal source doesn't seem likely but will evaluate further at time of screening colonoscopy.   2. Colon cancer screening. Last colonoscopy was 10 years ago. The risks, benefits, and alternatives to colonoscopy with possible biopsy and possible polypectomy were discussed with the patient and she consents to proceed.

## 2013-04-29 ENCOUNTER — Ambulatory Visit (AMBULATORY_SURGERY_CENTER): Payer: Medicare Other | Admitting: Internal Medicine

## 2013-04-29 ENCOUNTER — Encounter: Payer: Self-pay | Admitting: Internal Medicine

## 2013-04-29 VITALS — BP 110/70 | HR 73 | Temp 97.8°F | Resp 29 | Ht 66.0 in | Wt 136.0 lb

## 2013-04-29 DIAGNOSIS — Z1211 Encounter for screening for malignant neoplasm of colon: Secondary | ICD-10-CM

## 2013-04-29 MED ORDER — SODIUM CHLORIDE 0.9 % IV SOLN: 500.0000 mL | INTRAVENOUS | Status: DC

## 2013-04-29 NOTE — Progress Notes (Signed)
Dr Leone Payor informed of high pulse rate of 150-160. Pt concerned.  Placed on stretcher and lying down, placed on monitor and heart reate is down to 100. Dr Leone Payor in to speak with Pt. No orders given at this time.

## 2013-04-29 NOTE — Progress Notes (Signed)
Procedure ends, to recovery, report given and VSS. 

## 2013-04-29 NOTE — Progress Notes (Signed)
Patient did not experience any of the following events: a burn prior to discharge; a fall within the facility; wrong site/side/patient/procedure/implant event; or a hospital transfer or hospital admission upon discharge from the facility. (G8907) Patient did not have preoperative order for IV antibiotic SSI prophylaxis. (G8918)  

## 2013-04-29 NOTE — Op Note (Signed)
[facility] [address] [place], [address]   COLONOSCOPY PROCEDURE REPORT  PATIENT: Wendy, Serrano  MR#: [id number] BIRTHDATE: 1946-05-01 , 67  yrs. old GENDER: Female ENDOSCOPIST: Iva Boop, MD, [facility] REFERRED [id number] Esther Hardy, M.D. PROCEDURE DATE:  04/29/2013 PROCEDURE:   Colonoscopy, screening First Screening Colonoscopy - Avg.  risk and is 50 yrs.  old or older - No.  Prior Negative Screening - Now for repeat screening. 10 or more years since last screening  History of Adenoma - Now for follow-up colonoscopy & has been > or = to 3 yrs.  N/A  Polyps Removed Today? No.  Recommend repeat exam, <10 yrs? No. ASA CLASS:   Class II INDICATIONS:average risk screening and Last colonoscopy performed 10 years ago. MEDICATIONS: Propofol (Diprivan) 170  DESCRIPTION OF PROCEDURE:   After the risks benefits and alternatives of the procedure were thoroughly explained, informed consent was obtained.  A digital rectal exam revealed no abnormalities of the rectum.   The LB [id number]  endoscope was introduced through the anus and advanced to the terminal ileum which was intubated for a short distance. No adverse events experienced.   The quality of the prep was excellent using Suprep The instrument was then slowly withdrawn as the colon was fully examined.      COLON FINDINGS: A normal appearing cecum, ileocecal valve, and appendiceal orifice were identified.  The ascending, hepatic flexure, transverse, splenic flexure, descending, sigmoid colon and rectum appeared unremarkable.  No polyps or cancers were seen.   A right colon retroflexion was performed.  Retroflexed views revealed no abnormalities. The time to cecum=3 minutes 48 seconds. Withdrawal time=9 minutes 0 seconds.  The scope was withdrawn and the procedure completed. COMPLICATIONS: There were no complications.  ENDOSCOPIC IMPRESSION: Normal colonoscopy - excellent prep - second  screening  RECOMMENDATIONS: Repeat colonoscopy 10 years - 2024   eSigned:  Iva Boop, MD, [facility] 04/29/2013 11:43 AM   cc: Jacques Navy, MD and The Patient

## 2013-04-29 NOTE — Patient Instructions (Addendum)
The colonoscopy was normal!  Next routine colonoscopy in 10 years - 2024.  I appreciate the opportunity to care for you. Iva Boop, MD, [facility]   Discharge instructions given with verbal understanding. Normal exam. Resume previous medications. YOU HAD AN ENDOSCOPIC PROCEDURE TODAY AT THE [facility]: Refer to the procedure report that was given to you for any specific questions about what was found during the examination.  If the procedure report does not answer your questions, please call your gastroenterologist to clarify.  If you requested that your care partner not be given the details of your procedure findings, then the procedure report has been included in a sealed envelope for you to review at your convenience later.  YOU SHOULD EXPECT: Some feelings of bloating in the abdomen. Passage of more gas than usual.  Walking can help get rid of the air that was put into your GI tract during the procedure and reduce the bloating. If you had a lower endoscopy (such as a colonoscopy or flexible sigmoidoscopy) you may notice spotting of blood in your stool or on the toilet paper. If you underwent a bowel prep for your procedure, then you may not have a normal bowel movement for a few days.  DIET: Your first meal following the procedure should be a light meal and then it is ok to progress to your normal diet.  A half-sandwich or bowl of soup is an example of a good first meal.  Heavy or fried foods are harder to digest and may make you feel nauseous or bloated.  Likewise meals heavy in dairy and vegetables can cause extra gas to form and this can also increase the bloating.  Drink plenty of fluids but you should avoid alcoholic beverages for 24 hours.  ACTIVITY: Your care partner should take you home directly after the procedure.  You should plan to take it easy, moving slowly for the rest of the day.  You can resume normal activity the day after the procedure however you should NOT  DRIVE or use heavy machinery for 24 hours (because of the sedation medicines used during the test).    SYMPTOMS TO REPORT IMMEDIATELY: A gastroenterologist can be reached at any hour.  During normal business hours, 8:30 AM to 5:00 PM Monday through Friday, call [phone].  After hours and on weekends, please call the GI answering service at [phone] who will take a message and have the physician on call contact you.   Following lower endoscopy (colonoscopy or flexible sigmoidoscopy):  Excessive amounts of blood in the stool  Significant tenderness or worsening of abdominal pains  Swelling of the abdomen that is new, acute  Fever of 100F or higher   FOLLOW UP: If any biopsies were taken you will be contacted by phone or by letter within the next 1-3 weeks.  Call your gastroenterologist if you have not heard about the biopsies in 3 weeks.  Our staff will call the home number listed on your records the next business day following your procedure to check on you and address any questions or concerns that you may have at that time regarding the information given to you following your procedure. This is a courtesy call and so if there is no answer at the home number and we have not heard from you through the emergency physician on call, we will assume that you have returned to your regular daily activities without incident.  SIGNATURES/CONFIDENTIALITY: You and/or your care partner have signed  paperwork which will be entered into your electronic medical record.  These signatures attest to the fact that that the information above on your After Visit Summary has been reviewed and is understood.  Full responsibility of the confidentiality of this discharge information lies with you and/or your care-partner.

## 2013-04-29 NOTE — Progress Notes (Signed)
Agree with Ms. Guenther's assessment and plan. Carl E. Gessner, MD, FACG   

## 2013-05-02 ENCOUNTER — Telehealth: Payer: Self-pay

## 2013-05-02 NOTE — Telephone Encounter (Signed)
  Follow up Call-  Call back number 04/29/2013  Post procedure Call Back phone  # [phone]  Permission to leave phone message Yes     Patient questions:  Do you have a fever, pain , or abdominal swelling? no Pain Score  0 *  Have you tolerated food without any problems? yes  Have you been able to return to your normal activities? yes  Do you have any questions about your discharge instructions: Diet   no Medications  no Follow up visit  no  Do you have questions or concerns about your Care? no  Actions: * If pain score is 4 or above: No action needed, pain <4.   No problems per the pt's husband.  The pt was unavailable.  Maw

## 2013-05-18 ENCOUNTER — Ambulatory Visit (INDEPENDENT_AMBULATORY_CARE_PROVIDER_SITE_OTHER): Payer: Medicare Other

## 2013-05-18 DIAGNOSIS — Z23 Encounter for immunization: Secondary | ICD-10-CM

## 2013-05-24 ENCOUNTER — Encounter: Payer: Medicare Other | Admitting: Internal Medicine

## 2013-05-24 ENCOUNTER — Ambulatory Visit (INDEPENDENT_AMBULATORY_CARE_PROVIDER_SITE_OTHER): Payer: Medicare Other | Admitting: Internal Medicine

## 2013-05-24 ENCOUNTER — Encounter: Payer: Self-pay | Admitting: Internal Medicine

## 2013-05-24 VITALS — BP 110/80 | HR 77 | Temp 98.4°F | Wt 137.8 lb

## 2013-05-24 DIAGNOSIS — H9312 Tinnitus, left ear: Secondary | ICD-10-CM | POA: Insufficient documentation

## 2013-05-24 DIAGNOSIS — H9319 Tinnitus, unspecified ear: Secondary | ICD-10-CM | POA: Insufficient documentation

## 2013-05-24 NOTE — Patient Instructions (Signed)
Crackling sound inj the left ear with a normal exam. There is a bit of crepitus at TMJ bilaterally. No sign of infection or wax impaction.  Plan Audiology evaluation for any possible senori-neural hearing loss or damage.   Tinnitus Sounds you hear in your ears and coming from within the ear is called tinnitus. This can be a symptom of many ear disorders. It is often associated with hearing loss.  Tinnitus can be seen with:  Infections.  Ear blockages such as wax buildup.  Meniere's disease.  Ear damage.  Inherited.  Occupational causes. While irritating, it is not usually a threat to health. When the cause of the tinnitus is wax, infection in the middle ear, or foreign body it is easily treated. Hearing loss will usually be reversible.  TREATMENT  When treating the underlying cause does not get rid of tinnitus, it may be necessary to get rid of the unwanted sound by covering it up with more pleasant background noises. This may include music, the radio etc. There are tinnitus maskers which can be worn which produce background noise to cover up the tinnitus. Avoid all medications which tend to make tinnitus worse such as alcohol, caffeine, aspirin, and nicotine. There are many soothing background tapes such as rain, ocean, thunderstorms, etc. These soothing sounds help with sleeping or resting. Keep all follow-up appointments and referrals. This is important to identify the cause of the problem. It also helps avoid complications, impaired hearing, disability, or chronic pain. Document Released: 04/28/2005 Document Revised: 07/21/2011 Document Reviewed: 12/15/2007 [facility] Patient Information 2014 [place].

## 2013-05-24 NOTE — Assessment & Plan Note (Signed)
Crackling sound inj the left ear with a normal exam. There is a bit of crepitus at TMJ bilaterally. No sign of infection or wax impaction.  Plan Audiology evaluation for any possible senori-neural hearing loss or damage.

## 2013-05-24 NOTE — Progress Notes (Signed)
   Subjective:    Patient ID: Wendy Serrano, female    DOB: [birth year], 68 y.o.   MRN: [id number]  HPI In the interval she has seen Dr. Carlean Purl for colonoscopy - negative study. She is not having as much discomfort.   She presents for ringing in her left. She has had a problem with this in the past. A crackling sounding in the ear. No drainage. No pain. No loss of hearing.  PMH, FamHx and SocHx reviewed for any changes and relevance.  Current Outpatient Prescriptions on File Prior to Visit  Medication Sig Dispense Refill  . calcium-vitamin D (OSCAL WITH D) 500-200 MG-UNIT per tablet Take 1 tablet by mouth 2 (two) times daily.        [name] estradiol-norethindrone (ACTIVELLA) 1-0.5 MG per tablet Take 1 tablet by mouth daily.  30 tablet  12  . Garlic TABS Take 4 tablets by mouth daily. Kyolic      . hydrocortisone valerate cream (WESTCORT) 0.2 % Apply topically 2 (two) times daily.  45 g  2  . Multiple Vitamin (MULTIVITAMIN) tablet Take 1 tablet by mouth daily.        . Multiple Vitamins-Minerals (OCUVITE PO) Take 1 tablet by mouth daily.      . Omega-3 Fatty Acids (FISH OIL) 1200 MG CAPS Take 1 capsule (1,200 mg total) by mouth daily.      . valACYclovir (VALTREX) 1000 MG tablet 2 tablets AM and 2 tablets PM x 1 day for HSV skin eruption  4 tablet  1   No current facility-administered medications on file prior to visit.      Review of Systems System review is negative for any constitutional, cardiac, pulmonary, GI or neuro symptoms or complaints other than as described in the HPI.     Objective:   Physical Exam Filed Vitals:   05/24/13 0837  BP: 110/80  Pulse: 77  Temp: 98.4 F (36.9 C)   Gen'l- WNWD nicely groomed woman HEENT- EACs clear, TM/s normal, mild crepitus bilaterally at TMJ Cor- 2+ pulse       Assessment & Plan:

## 2013-05-24 NOTE — Progress Notes (Signed)
Pre visit review using our clinic review tool, if applicable. No additional management support is needed unless otherwise documented below in the visit note. 

## 2013-07-05 ENCOUNTER — Ambulatory Visit: Payer: Medicare Other

## 2013-11-07 ENCOUNTER — Other Ambulatory Visit: Payer: Self-pay

## 2013-11-07 DIAGNOSIS — Z1231 Encounter for screening mammogram for malignant neoplasm of breast: Secondary | ICD-10-CM

## 2013-12-14 ENCOUNTER — Ambulatory Visit
Admission: RE | Admit: 2013-12-14 | Discharge: 2013-12-14 | Disposition: A | Discharge status: Home / Self Care | Payer: Medicare Other | Source: Ambulatory Visit

## 2013-12-14 DIAGNOSIS — Z1231 Encounter for screening mammogram for malignant neoplasm of breast: Secondary | ICD-10-CM

## 2013-12-22 ENCOUNTER — Telehealth: Payer: Self-pay

## 2013-12-22 NOTE — Telephone Encounter (Signed)
LVM for pt to call back to schedule awv per medicare And need to change PCP

## 2014-02-11 ENCOUNTER — Emergency Department (HOSPITAL_COMMUNITY): Payer: Medicare Other

## 2014-02-11 ENCOUNTER — Emergency Department (HOSPITAL_COMMUNITY)
Admission: EM | Admit: 2014-02-11 | Discharge: 2014-02-11 | Disposition: A | Discharge status: Home / Self Care | Payer: Medicare Other | Attending: Emergency Medicine | Admitting: Emergency Medicine

## 2014-02-11 ENCOUNTER — Encounter (HOSPITAL_COMMUNITY): Payer: Self-pay | Admitting: Emergency Medicine

## 2014-02-11 DIAGNOSIS — Z79899 Other long term (current) drug therapy: Secondary | ICD-10-CM | POA: Insufficient documentation

## 2014-02-11 DIAGNOSIS — Z8669 Personal history of other diseases of the nervous system and sense organs: Secondary | ICD-10-CM | POA: Insufficient documentation

## 2014-02-11 DIAGNOSIS — Z8739 Personal history of other diseases of the musculoskeletal system and connective tissue: Secondary | ICD-10-CM | POA: Insufficient documentation

## 2014-02-11 DIAGNOSIS — Z862 Personal history of diseases of the blood and blood-forming organs and certain disorders involving the immune mechanism: Secondary | ICD-10-CM | POA: Insufficient documentation

## 2014-02-11 DIAGNOSIS — Y9289 Other specified places as the place of occurrence of the external cause: Secondary | ICD-10-CM | POA: Insufficient documentation

## 2014-02-11 DIAGNOSIS — Z8709 Personal history of other diseases of the respiratory system: Secondary | ICD-10-CM | POA: Diagnosis not present

## 2014-02-11 DIAGNOSIS — W01198A Fall on same level from slipping, tripping and stumbling with subsequent striking against other object, initial encounter: Secondary | ICD-10-CM | POA: Diagnosis not present

## 2014-02-11 DIAGNOSIS — S0990XA Unspecified injury of head, initial encounter: Secondary | ICD-10-CM | POA: Diagnosis not present

## 2014-02-11 DIAGNOSIS — Z872 Personal history of diseases of the skin and subcutaneous tissue: Secondary | ICD-10-CM | POA: Insufficient documentation

## 2014-02-11 DIAGNOSIS — Y9389 Activity, other specified: Secondary | ICD-10-CM | POA: Diagnosis not present

## 2014-02-11 DIAGNOSIS — Z8742 Personal history of other diseases of the female genital tract: Secondary | ICD-10-CM | POA: Diagnosis not present

## 2014-02-11 DIAGNOSIS — Z8619 Personal history of other infectious and parasitic diseases: Secondary | ICD-10-CM | POA: Diagnosis not present

## 2014-02-11 DIAGNOSIS — Z7952 Long term (current) use of systemic steroids: Secondary | ICD-10-CM | POA: Diagnosis not present

## 2014-02-11 NOTE — Discharge Instructions (Signed)
CT normal - no fractures or bleeding,

## 2014-02-11 NOTE — ED Provider Notes (Signed)
Medical screening examination/treatment/procedure(s) were conducted as a shared visit with non-physician practitioner(s) and myself.  I personally evaluated the patient during the encounter  Please see my separate respective documentation pertaining to this patient encounter   Johnna Acosta, MD 02/11/14 2333

## 2014-02-11 NOTE — ED Provider Notes (Signed)
68 year old female presents 10 days after striking her head on a mailbox surrounded with brick, no loss of consciousness, mild headache which initially improved, no nausea vomiting dizziness or focal neurologic complaints. She initially had some tinnitus, returns today after having increased tinnitus and abnormal feeling in her ear. Requesting CT scan. On exam the patient has a normal neurologic exam including speech, cranial nerves III through XII, strength and sensation of all 4 extremities and finger-nose-finger. CT scan shows no signs of acute intracranial injury or cranial injury. The patient will be discharged home for routine outpatient followup.  Medical screening examination/treatment/procedure(s) were conducted as a shared visit with non-physician practitioner(s) and myself.  I personally evaluated the patient during the encounter.  Clinical Impression:   Final diagnoses:  Minor head injury without loss of consciousness, initial encounter         Wendy Acosta, MD 02/11/14 [id number]

## 2014-02-11 NOTE — ED Provider Notes (Signed)
CSN: [id number]     Arrival date & time 02/11/14  1758 History   First MD Initiated Contact with Patient 02/11/14 1811     Chief Complaint  Patient presents with  . Head Injury     (Consider location/radiation/quality/duration/timing/severity/associated sxs/prior Treatment) HPI  Patient to the ED from home requesting head CT. On 02/01/2014 she slipped and hit her head on her brick mail box. She did not have loc, neck pain or laceration. She reports contusion to her right temporal/parietal scalp. Since then she has had intermittent ringing in her ears. Alternating sides. This morning she developed worsened ringing in her ears and reports "not feeling right" for a time. She took Tylenol and this relieved the ringing and her sensation of not feeling well. She came to the ER because she is concerned about possible intracranial abnormality and would like a head CT. Denies blurry vision, confusion, focal or generalized weakness, headache, photophobia, loss of bowel or urine control. No fevers, SOB, CP, abdominal pains, nausea or vomiting.  Past Medical History  Diagnosis Date  . Leukopenia     chronic  . 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 Allergic rhinitis   . Eczema     childhood  . Rotator cuff tear   . 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 H/O varicella   . History of measles, mumps, or rubella   . Yeast infection   . H/O osteopenia 01/09/04  . Menopausal symptoms   . H/O osteoporosis 2008  . 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 Renal cyst     bilateral  . Liver hemangioma    Past Surgical History  Procedure Laterality Date  . Rotator cuff repair Left 08/2009    Left shoulder-rotator cuff repair [facility])  . Wisdom tooth extraction     Family History  Problem Relation Age of Onset  . Prostate cancer Father     died  . Pancreatic cancer Mother     died  . Diabetes Mother   . Hyperlipidemia Sister    History  Substance Use  Topics  . Smoking status: Never Smoker   . Smokeless tobacco: Never Used  . Alcohol Use: No   OB History   Grav Para Term Preterm Abortions TAB SAB Ect Mult Living   0 0 0 0 0 0 0 0 0 0      Review of Systems  All other systems reviewed and are negative.     Allergies  Review of patient's allergies indicates no known allergies.  Home Medications   Prior to Admission medications   Medication Sig Start Date End Date Taking? Authorizing Provider  acetaminophen (TYLENOL) 650 MG CR tablet Take 650 mg by mouth every 8 (eight) hours as needed for pain.   Yes Historical Provider, MD  calcium-vitamin D (OSCAL WITH D) 500-200 MG-UNIT per tablet Take 1 tablet by mouth 2 (two) times daily.     Yes Historical Provider, MD  estradiol-norethindrone (ACTIVELLA) 1-0.5 MG per tablet Take 1 tablet by mouth every 3 (three) days.   Yes Historical Provider, MD  Garlic TABS Take 2 tablets by mouth 2 (two) times daily. Kyolic   Yes Historical Provider, MD  glucosamine-chondroitin 500-400 MG tablet Take 1 tablet by mouth 2 (two) times daily.   Yes Historical Provider, MD  Multiple Vitamin (MULTIVITAMIN) tablet Take 1 tablet by mouth daily.     Yes Historical Provider, MD  Multiple Vitamins-Minerals (  OCUVITE PO) Take 1 tablet by mouth daily.   Yes Historical Provider, MD  hydrocortisone valerate cream (WESTCORT) 0.2 % Apply topically 2 (two) times daily. 03/01/12   Neena Rhymes, MD   BP 132/72  Pulse 70  Temp(Src) 98.5 F (36.9 C) (Oral)  Resp 18  SpO2 100% Physical Exam  Nursing note and vitals reviewed. Constitutional: She is oriented to person, place, and time. She appears well-developed and well-nourished. No distress.  HENT:  Head: Normocephalic and atraumatic. Head is without raccoon's eyes, without Battle's sign, without abrasion, without contusion, without laceration, without right periorbital erythema and without left periorbital erythema. Hair is normal.  Right Ear: Tympanic membrane  and ear canal normal. No hemotympanum.  Left Ear: Tympanic membrane and ear canal normal. No hemotympanum.  Nose: Nose normal.  Mouth/Throat: Uvula is midline and oropharynx is clear and moist.  Eyes: Pupils are equal, round, and reactive to light.  Neck: Normal range of motion. Neck supple.  Cardiovascular: Normal rate and regular rhythm.   Pulmonary/Chest: Effort normal.  Abdominal: Soft.  Neurological: She is alert and oriented to person, place, and time. She has normal strength. No sensory deficit. She displays a negative Romberg sign. GCS eye subscore is 4. GCS verbal subscore is 5. GCS motor subscore is 6.  Cranial nerves III-XII intact.  Skin: Skin is warm and dry.  Psychiatric: She has a normal mood and affect. Her speech is normal and behavior is normal.    ED Course  Procedures (including critical care time) Labs Review Labs Reviewed - No data to display  Imaging Review Ct Head Wo Contrast  02/11/2014   CLINICAL DATA:  Head injury approximately 11 days ago ; the patient reports head trauma status post fall and with transient headache and tinnitus. The patient reports at the tinnitus returned today.  EXAM: CT HEAD WITHOUT CONTRAST  TECHNIQUE: Contiguous axial images were obtained from the base of the skull through the vertex without intravenous contrast.  COMPARISON:  Noncontrast CT scan of the brain of Sep 27, 2004.  FINDINGS: The the ventricles are normal in size and position. There is no intracranial hemorrhage nor intracranial mass effect. There are stable basal ganglia calcifications bilaterally. There is stable decreased density in the deep white matter of both cerebral hemispheres consistent with chronic small vessel ischemia. The cerebellum and brainstem are unremarkable.  The observed paranasal sinuses and mastoid air cells are clear. There is no acute skull fracture.  IMPRESSION: There is no acute or subacute intracranial hemorrhage. There are moderate changes of chronic  small vessel ischemia. There is no acute skull fracture.   Electronically Signed   By: David  Martinique   On: 02/11/2014 19:28     EKG Interpretation None      MDM   Final diagnoses:  Minor head injury without loss of consciousness, initial encounter    Dr. Sabra Heck has seen patient as well. Normal Head CT. She has a normal neurological exam. These are very reassuring. She will be discharged with outpatient follow-up.  69 y.o.Haley Binion's evaluation in the Emergency Department is complete. It has been determined that no acute conditions requiring further emergency intervention are present at this time. The patient/guardian have been advised of the diagnosis and plan. We have discussed signs and symptoms that warrant return to the ED, such as changes or worsening in symptoms.  Vital signs are stable at discharge. Filed Vitals:   02/11/14 1944  BP: 132/72  Pulse: 70  Temp: 98.5 F (  36.9 C)  Resp: 18    Patient/guardian has voiced understanding and agreed to follow-up with the PCP or specialist.     Linus Mako, PA-C 02/11/14 1958

## 2014-02-11 NOTE — ED Notes (Signed)
Pt presents with c/o head injury that occurred on 9/23. Pt reports that on that day she hit her head on the brick area around the mailbox after slipping. Pt reports on the day of the injury she felt some pain in her head and some ringing in her ears. Pt reports the ringing in her ears was back earlier today but has resolved now after taking some tylenol approx 3 hours ago.

## 2014-02-16 ENCOUNTER — Ambulatory Visit (INDEPENDENT_AMBULATORY_CARE_PROVIDER_SITE_OTHER): Payer: 59 | Admitting: Internal Medicine

## 2014-02-16 ENCOUNTER — Encounter: Payer: Self-pay | Admitting: Internal Medicine

## 2014-02-16 VITALS — BP 118/80 | HR 73 | Temp 98.1°F | Wt 138.5 lb

## 2014-02-16 DIAGNOSIS — H9311 Tinnitus, right ear: Secondary | ICD-10-CM

## 2014-02-16 DIAGNOSIS — H8309 Labyrinthitis, unspecified ear: Secondary | ICD-10-CM

## 2014-02-16 NOTE — Patient Instructions (Signed)
The ENT referral will be scheduled and you'll be notified of the time.Please call the Referral Co-Ordinator @ [phone] if you have not been notified of appointment time within 7-10 days. 

## 2014-02-16 NOTE — Progress Notes (Signed)
Pre visit review using our clinic review tool, if applicable. No additional management support is needed unless otherwise documented below in the visit note. 

## 2014-02-16 NOTE — Progress Notes (Signed)
   Subjective:    Patient ID: Wendy Serrano, female    DOB: [birth year], 68 y.o.   MRN: [id number]  HPI  She struck the right temporal/parietal area of her  head on a brick mail box 02/01/14 in a mechanical fall. She had slipped in the rain. There was no LOC or laceration.  There was no cardiac or neurologic prodrome prior to the fall.    She was seen in the emergency room 02/11/14  for intermittent tinnitus alternating from left to right the right ear.  The ER note was reviewed; it stated that she "would like a head CT ". This reviewed only moderate changes of small vessel ischemia.Her neurologic exam was normal in the emergency room.  The tinnitus has continued to be present intermittently  Significant past history includes labyrinthitis for which she was seen in 2014. Apparently she saw an ear nose and throat specialist; those records cannot be a located. No cause was found and the tinnitus resolved      Review of Systems    Denied were any change in heart rhythm or rate prior to the event. There was no associated chest pain or shortness of breath .  Also specifically denied prior to the episode were headache, limb weakness, tingling, or numbness. No seizure activity noted.      Objective:   Physical Exam   Positive or pertinent findings include:  She has patchy irregular alopecia.  She has dense arcus senilis.  Cranial nerve exam is normal deficit  Neurologic exam to include deep tendon reflexes, gait testing, Romberg, and finger-nose, rales normal.  She exhibits an S4.  No carotid bruits are present.        Assessment & Plan:  #1 intermittent tinnitus on the right. She temporally relates this to the blow on the head approximately 3 weeks ago.  #2 past history of tinnitus and labyrinthitis.  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  Reevaluation by ENT is indicated.

## 2014-03-15 ENCOUNTER — Telehealth: Payer: Self-pay | Admitting: Internal Medicine

## 2014-03-15 ENCOUNTER — Encounter: Payer: Medicare Other | Admitting: Internal Medicine

## 2014-03-15 DIAGNOSIS — Z0289 Encounter for other administrative examinations: Secondary | ICD-10-CM

## 2014-03-15 NOTE — Telephone Encounter (Signed)
Very sorry, but I am unable to take this patient for now, due to panel already large enough I have had one or two patients request another MD as PCP b/c they were unable to see me

## 2014-03-15 NOTE — Telephone Encounter (Signed)
Dr [name], patient is requesting you become her PCP. She is a former Dr. Linda Hedges patient and she would really you. Please advise. She states she has seen you in the past and was happy.

## 2014-03-20 ENCOUNTER — Telehealth: Payer: Self-pay | Admitting: Internal Medicine

## 2014-03-20 NOTE — Telephone Encounter (Signed)
Pt states she WAS at this appt, but Dr Doug Sou was out . Can you please remove the no show that is on her appt? thanks

## 2014-05-01 ENCOUNTER — Encounter: Payer: Medicare Other | Admitting: Internal Medicine

## 2014-05-10 ENCOUNTER — Other Ambulatory Visit (INDEPENDENT_AMBULATORY_CARE_PROVIDER_SITE_OTHER): Payer: Medicare Other

## 2014-05-10 ENCOUNTER — Ambulatory Visit (INDEPENDENT_AMBULATORY_CARE_PROVIDER_SITE_OTHER): Payer: Medicare Other | Admitting: Internal Medicine

## 2014-05-10 ENCOUNTER — Ambulatory Visit (INDEPENDENT_AMBULATORY_CARE_PROVIDER_SITE_OTHER): Payer: Medicare Other | Admitting: Geriatric Medicine

## 2014-05-10 ENCOUNTER — Encounter: Payer: Self-pay | Admitting: Internal Medicine

## 2014-05-10 VITALS — BP 104/62 | HR 76 | Temp 97.7°F | Resp 16 | Ht 66.5 in | Wt 137.0 lb

## 2014-05-10 DIAGNOSIS — Z0000 Encounter for general adult medical examination without abnormal findings: Secondary | ICD-10-CM

## 2014-05-10 DIAGNOSIS — Z23 Encounter for immunization: Secondary | ICD-10-CM

## 2014-05-10 LAB — COMPREHENSIVE METABOLIC PANEL
ALT: 23 U/L (ref 0–35)
AST: 31 U/L (ref 0–37)
Albumin: 4.1 g/dL (ref 3.5–5.2)
Alkaline Phosphatase: 52 U/L (ref 39–117)
BILIRUBIN TOTAL: 0.8 mg/dL (ref 0.2–1.2)
BUN: 19 mg/dL (ref 6–23)
CO2: 28 mEq/L (ref 19–32)
Calcium: 9.5 mg/dL (ref 8.4–10.5)
Chloride: 107 mEq/L (ref 96–112)
Creatinine, Ser: 0.8 mg/dL (ref 0.4–1.2)
GFR: 93.98 mL/min (ref >60.00)
Glucose, Bld: 93 mg/dL (ref 70–99)
Potassium: 4.3 mEq/L (ref 3.5–5.1)
Sodium: 141 mEq/L (ref 135–145)
Total Protein: 7 g/dL (ref 6.0–8.3)

## 2014-05-10 LAB — LIPID PANEL
CHOLESTEROL: 240 mg/dL — AB (ref 0–200)
HDL: 93 mg/dL (ref >39.00)
LDL Cholesterol: 142 mg/dL — ABNORMAL HIGH (ref 0–99)
NonHDL: 147
TRIGLYCERIDES: 26 mg/dL (ref 0.0–149.0)
Total CHOL/HDL Ratio: 3
VLDL: 5.2 mg/dL (ref 0.0–40.0)

## 2014-05-10 LAB — CBC
HCT: 39.2 % (ref 36.0–46.0)
Hemoglobin: 13.2 g/dL (ref 12.0–15.0)
MCHC: 33.5 g/dL (ref 30.0–36.0)
MCV: 91.8 fl (ref 78.0–100.0)
PLATELETS: 304 10*3/uL (ref 150.0–400.0)
RBC: 4.27 Mil/uL (ref 3.87–5.11)
RDW: 13.4 % (ref 11.5–15.5)
WBC: 4.4 10*3/uL (ref 4.0–10.5)

## 2014-05-10 MED ORDER — ESTRADIOL-NORETHINDRONE ACET 1-0.5 MG PO TABS: 1.0000 | ORAL_TABLET | Freq: Every day | ORAL | Status: DC

## 2014-05-10 MED ORDER — VALACYCLOVIR HCL 1 G PO TABS: 1000.0000 mg | ORAL_TABLET | Freq: Two times a day (BID) | ORAL | Status: DC

## 2014-05-10 MED ORDER — HYDROCORTISONE VALERATE 0.2 % EX CREA: TOPICAL_CREAM | Freq: Two times a day (BID) | CUTANEOUS | Status: DC

## 2014-05-10 NOTE — Progress Notes (Signed)
Pre visit review using our clinic review tool, if applicable. No additional management support is needed unless otherwise documented below in the visit note. 

## 2014-05-10 NOTE — Patient Instructions (Signed)
We have sent in your refills and will check your blood work.   We have given you the flu shot and you can come back for the shingles shot in 3-4 weeks.  We have ordered the bone density scan.   Health Maintenance Adopting a healthy lifestyle and getting preventive care can go a long way to promote health and wellness. Talk with your health care provider about what schedule of regular examinations is right for you. This is a good chance for you to check in with your provider about disease prevention and staying healthy. In between checkups, there are plenty of things you can do on your own. Experts have done a lot of research about which lifestyle changes and preventive measures are most likely to keep you healthy. Ask your health care provider for more information. WEIGHT AND DIET  Eat a healthy diet  Be sure to include plenty of vegetables, fruits, low-fat dairy products, and lean protein.  Do not eat a lot of foods high in solid fats, added sugars, or salt.  Get regular exercise. This is one of the most important things you can do for your health.  Most adults should exercise for at least 150 minutes each week. The exercise should increase your heart rate and make you sweat (moderate-intensity exercise).  Most adults should also do strengthening exercises at least twice a week. This is in addition to the moderate-intensity exercise.  Maintain a healthy weight  Body mass index (BMI) is a measurement that can be used to identify possible weight problems. It estimates body fat based on height and weight. Your health care provider can help determine your BMI and help you achieve or maintain a healthy weight.  For females 63 years of age and older:   A BMI below 18.5 is considered underweight.  A BMI of 18.5 to 24.9 is normal.  A BMI of 25 to 29.9 is considered overweight.  A BMI of 30 and above is considered obese.  Watch levels of cholesterol and blood lipids  You should start  having your blood tested for lipids and cholesterol at 69 years of age, then have this test every 5 years.  You may need to have your cholesterol levels checked more often if:  Your lipid or cholesterol levels are high.  You are older than 69 years of age.  You are at high risk for heart disease.  CANCER SCREENING   Lung Cancer  Lung cancer screening is recommended for adults 10-20 years old who are at high risk for lung cancer because of a history of smoking.  A yearly low-dose CT scan of the lungs is recommended for people who:  Currently smoke.  Have quit within the past 15 years.  Have at least a 30-pack-year history of smoking. A pack year is smoking an average of one pack of cigarettes a day for 1 year.  Yearly screening should continue until it has been 15 years since you quit.  Yearly screening should stop if you develop a health problem that would prevent you from having lung cancer treatment.  Breast Cancer  Practice breast self-awareness. This means understanding how your breasts normally appear and feel.  It also means doing regular breast self-exams. Let your health care provider know about any changes, no matter how small.  If you are in your 20s or 30s, you should have a clinical breast exam (CBE) by a health care provider every 1-3 years as part of a regular health exam.  If you are 40 or older, have a CBE every year. Also consider having a breast X-ray (mammogram) every year.  If you have a family history of breast cancer, talk to your health care provider about genetic screening.  If you are at high risk for breast cancer, talk to your health care provider about having an MRI and a mammogram every year.  Breast cancer gene (BRCA) assessment is recommended for women who have family members with BRCA-related cancers. BRCA-related cancers include:  Breast.  Ovarian.  Tubal.  Peritoneal cancers.  Results of the assessment will determine the need for  genetic counseling and BRCA1 and BRCA2 testing. Cervical Cancer Routine pelvic examinations to screen for cervical cancer are no longer recommended for nonpregnant women who are considered low risk for cancer of the pelvic organs (ovaries, uterus, and vagina) and who do not have symptoms. A pelvic examination may be necessary if you have symptoms including those associated with pelvic infections. Ask your health care provider if a screening pelvic exam is right for you.   The Pap test is the screening test for cervical cancer for women who are considered at risk.  If you had a hysterectomy for a problem that was not cancer or a condition that could lead to cancer, then you no longer need Pap tests.  If you are older than 65 years, and you have had normal Pap tests for the past 10 years, you no longer need to have Pap tests.  If you have had past treatment for cervical cancer or a condition that could lead to cancer, you need Pap tests and screening for cancer for at least 20 years after your treatment.  If you no longer get a Pap test, assess your risk factors if they change (such as having a new sexual partner). This can affect whether you should start being screened again.  Some women have medical problems that increase their chance of getting cervical cancer. If this is the case for you, your health care provider may recommend more frequent screening and Pap tests.  The human papillomavirus (HPV) test is another test that may be used for cervical cancer screening. The HPV test looks for the virus that can cause cell changes in the cervix. The cells collected during the Pap test can be tested for HPV.  The HPV test can be used to screen women 94 years of age and older. Getting tested for HPV can extend the interval between normal Pap tests from three to five years.  An HPV test also should be used to screen women of any age who have unclear Pap test results.  After 69 years of age, women  should have HPV testing as often as Pap tests.  Colorectal Cancer  This type of cancer can be detected and often prevented.  Routine colorectal cancer screening usually begins at 69 years of age and continues through 69 years of age.  Your health care provider may recommend screening at an earlier age if you have risk factors for colon cancer.  Your health care provider may also recommend using home test kits to check for hidden blood in the stool.  A small camera at the end of a tube can be used to examine your colon directly (sigmoidoscopy or colonoscopy). This is done to check for the earliest forms of colorectal cancer.  Routine screening usually begins at age 88.  Direct examination of the colon should be repeated every 5-10 years through 69 years of age. However,  you may need to be screened more often if early forms of precancerous polyps or small growths are found. Skin Cancer  Check your skin from head to toe regularly.  Tell your health care provider about any new moles or changes in moles, especially if there is a change in a mole's shape or color.  Also tell your health care provider if you have a mole that is larger than the size of a pencil eraser.  Always use sunscreen. Apply sunscreen liberally and repeatedly throughout the day.  Protect yourself by wearing long sleeves, pants, a wide-brimmed hat, and sunglasses whenever you are outside. HEART DISEASE, DIABETES, AND HIGH BLOOD PRESSURE   Have your blood pressure checked at least every 1-2 years. High blood pressure causes heart disease and increases the risk of stroke.  If you are between 33 years and 62 years old, ask your health care provider if you should take aspirin to prevent strokes.  Have regular diabetes screenings. This involves taking a blood sample to check your fasting blood sugar level.  If you are at a normal weight and have a low risk for diabetes, have this test once every three years after 69 years  of age.  If you are overweight and have a high risk for diabetes, consider being tested at a younger age or more often. PREVENTING INFECTION  Hepatitis B  If you have a higher risk for hepatitis B, you should be screened for this virus. You are considered at high risk for hepatitis B if:  You were born in a country where hepatitis B is common. Ask your health care provider which countries are considered high risk.  Your parents were born in a high-risk country, and you have not been immunized against hepatitis B (hepatitis B vaccine).  You have HIV or AIDS.  You use needles to inject street drugs.  You live with someone who has hepatitis B.  You have had sex with someone who has hepatitis B.  You get hemodialysis treatment.  You take certain medicines for conditions, including cancer, organ transplantation, and autoimmune conditions. Hepatitis C  Blood testing is recommended for:  Everyone born from [birth year] through 1966.  Anyone with known risk factors for hepatitis C. Sexually transmitted infections (STIs)  You should be screened for sexually transmitted infections (STIs) including gonorrhea and chlamydia if:  You are sexually active and are younger than 69 years of age.  You are older than 69 years of age and your health care provider tells you that you are at risk for this type of infection.  Your sexual activity has changed since you were last screened and you are at an increased risk for chlamydia or gonorrhea. Ask your health care provider if you are at risk.  If you do not have HIV, but are at risk, it may be recommended that you take a prescription medicine daily to prevent HIV infection. This is called pre-exposure prophylaxis (PrEP). You are considered at risk if:  You are sexually active and do not regularly use condoms or know the HIV status of your partner(s).  You take drugs by injection.  You are sexually active with a partner who has HIV. Talk with your  health care provider about whether you are at high risk of being infected with HIV. If you choose to begin PrEP, you should first be tested for HIV. You should then be tested every 3 months for as long as you are taking PrEP.  PREGNANCY   If  you are premenopausal and you may become pregnant, ask your health care provider about preconception counseling.  If you may become pregnant, take 400 to 800 micrograms (mcg) of folic acid every day.  If you want to prevent pregnancy, talk to your health care provider about birth control (contraception). OSTEOPOROSIS AND MENOPAUSE   Osteoporosis is a disease in which the bones lose minerals and strength with aging. This can result in serious bone fractures. Your risk for osteoporosis can be identified using a bone density scan.  If you are 31 years of age or older, or if you are at risk for osteoporosis and fractures, ask your health care provider if you should be screened.  Ask your health care provider whether you should take a calcium or vitamin D supplement to lower your risk for osteoporosis.  Menopause may have certain physical symptoms and risks.  Hormone replacement therapy may reduce some of these symptoms and risks. Talk to your health care provider about whether hormone replacement therapy is right for you.  HOME CARE INSTRUCTIONS   Schedule regular health, dental, and eye exams.  Stay current with your immunizations.   Do not use any tobacco products including cigarettes, chewing tobacco, or electronic cigarettes.  If you are pregnant, do not drink alcohol.  If you are breastfeeding, limit how much and how often you drink alcohol.  Limit alcohol intake to no more than 1 drink per day for nonpregnant women. One drink equals 12 ounces of beer, 5 ounces of wine, or 1 ounces of hard liquor.  Do not use street drugs.  Do not share needles.  Ask your health care provider for help if you need support or information about quitting  drugs.  Tell your health care provider if you often feel depressed.  Tell your health care provider if you have ever been abused or do not feel safe at home. Document Released: 11/11/2010 Document Revised: 09/12/2013 Document Reviewed: 03/30/2013 [facility] Patient Information 2015 [place], [place]. This information is not intended to replace advice given to you by your health care provider. Make sure you discuss any questions you have with your health care provider.

## 2014-05-11 ENCOUNTER — Encounter: Payer: Self-pay | Admitting: Internal Medicine

## 2014-05-11 NOTE — Progress Notes (Signed)
   Subjective:    Patient ID: Wendy Serrano, female    DOB: [birth year], 68 y.o.   MRN: [id number]  HPI Here for medicare wellness, no new problems or complaints today.   Diet: heart healthy Physical activity: average level of activity Depression/mood screen: negative Hearing: intact to whispered voice Visual acuity: grossly normal, performs annual eye exam  ADLs: capable Fall risk: none Home safety: good Cognitive evaluation: intact to orientation, naming, recall and repetition EOL planning: adv directives, full code/ I agree  I have personally reviewed and have noted 1. The patient's medical and social history 2. Their use of alcohol, tobacco or illicit drugs 3. Their current medications and supplements 4. The patient's functional ability including ADL's, fall risks, home safety risks and hearing or visual impairment. 5. Diet and physical activities 6. Evidence for depression or mood disorders 7.  Care team reviewed and updated  Review of Systems  Constitutional: Negative for fever, activity change, appetite change, fatigue and unexpected weight change.  HENT: Negative.   Respiratory: Negative for cough, chest tightness, shortness of breath and wheezing.   Cardiovascular: Negative for chest pain, palpitations and leg swelling.  Gastrointestinal: Negative for nausea, abdominal pain, diarrhea, constipation and abdominal distention.  Musculoskeletal: Negative.   Skin: Negative.   Neurological: Negative.   Psychiatric/Behavioral: Negative.       Objective:   Physical Exam  Constitutional: She is oriented to person, place, and time. She appears well-developed and well-nourished.  HENT:  Head: Normocephalic and atraumatic.  Eyes: EOM are normal.  Neck: Normal range of motion.  Cardiovascular: Normal rate and regular rhythm.   Pulmonary/Chest: Effort normal and breath sounds normal. No respiratory distress. She has no wheezes. Right breast exhibits no inverted nipple, no  mass, no nipple discharge, no skin change and no tenderness. Left breast exhibits no inverted nipple, no mass, no nipple discharge, no skin change and no tenderness. Breasts are symmetrical.  Breast exam without any overt lymph nodes.   Abdominal: Soft. Bowel sounds are normal. She exhibits no distension. There is no tenderness. There is no rebound.  Musculoskeletal: She exhibits no edema.  Neurological: She is alert and oriented to person, place, and time. Coordination normal.  Skin: Skin is warm and dry.  Nursing note and vitals reviewed.  Filed Vitals:   05/10/14 1135  BP: 104/62  Pulse: 76  Temp: 97.7 F (36.5 C)  TempSrc: Oral  Resp: 16  Height: 5' 6.5" (1.689 m)  Weight: 137 lb (62.143 kg)  SpO2: 98%   Current Outpatient Prescriptions on File Prior to Visit  Medication Sig Dispense Refill  . calcium-vitamin D (OSCAL WITH D) 500-200 MG-UNIT per tablet Take 1 tablet by mouth 2 (two) times daily.      . Garlic TABS Take 2 tablets by mouth 2 (two) times daily. Kyolic    . glucosamine-chondroitin 500-400 MG tablet Take 1 tablet by mouth 2 (two) times daily.    . Multiple Vitamin (MULTIVITAMIN) tablet Take 1 tablet by mouth daily.      . Multiple Vitamins-Minerals (OCUVITE PO) Take 1 tablet by mouth daily.    [name] acetaminophen (TYLENOL) 650 MG CR tablet Take 650 mg by mouth every 8 (eight) hours as needed for pain.     No current facility-administered medications on file prior to visit.      Assessment & Plan:  Flu shot given today.

## 2014-05-11 NOTE — Assessment & Plan Note (Signed)
Physical exam normal, breast exam performed today without findings, Shingles and Tdap up to date, pneumonia up to date, flu shot given today.

## 2014-06-22 ENCOUNTER — Telehealth: Payer: Self-pay | Admitting: Internal Medicine

## 2014-06-22 NOTE — Telephone Encounter (Signed)
Do you know the answer to this question?

## 2014-06-22 NOTE — Telephone Encounter (Signed)
Patient wanting to schedule a DEXA scan. She has [place] . Does she need a visit first?   She also asked about the syphilis vaccine

## 2014-06-28 NOTE — Telephone Encounter (Signed)
Dr. Doug Sou put in a referral for this patient to have a DEXA scan and she has not been called with an appointment. Is this something that you guys schedule, or can she schedule it herself?

## 2014-06-28 NOTE — Telephone Encounter (Signed)
Pt doesn't need referral for [facility]

## 2014-09-12 ENCOUNTER — Ambulatory Visit: Payer: Self-pay | Admitting: Neurology

## 2014-11-15 DIAGNOSIS — H40043 Steroid responder, bilateral: Secondary | ICD-10-CM | POA: Insufficient documentation

## 2014-11-15 DIAGNOSIS — H20023 Recurrent acute iridocyclitis, bilateral: Secondary | ICD-10-CM | POA: Insufficient documentation

## 2014-11-15 DIAGNOSIS — IMO0002 Reserved for concepts with insufficient information to code with codable children: Secondary | ICD-10-CM | POA: Insufficient documentation

## 2014-12-04 ENCOUNTER — Other Ambulatory Visit: Payer: Self-pay

## 2014-12-04 DIAGNOSIS — Z1231 Encounter for screening mammogram for malignant neoplasm of breast: Secondary | ICD-10-CM

## 2014-12-15 DIAGNOSIS — H209 Unspecified iridocyclitis: Secondary | ICD-10-CM | POA: Insufficient documentation

## 2014-12-15 DIAGNOSIS — H35363 Drusen (degenerative) of macula, bilateral: Secondary | ICD-10-CM | POA: Insufficient documentation

## 2015-01-09 ENCOUNTER — Ambulatory Visit: Payer: Self-pay

## 2015-02-09 ENCOUNTER — Ambulatory Visit
Admission: RE | Admit: 2015-02-09 | Discharge: 2015-02-09 | Disposition: A | Discharge status: Home / Self Care | Payer: Medicare Other | Source: Ambulatory Visit

## 2015-02-09 DIAGNOSIS — Z1231 Encounter for screening mammogram for malignant neoplasm of breast: Secondary | ICD-10-CM

## 2015-03-21 ENCOUNTER — Ambulatory Visit (INDEPENDENT_AMBULATORY_CARE_PROVIDER_SITE_OTHER)
Admission: RE | Admit: 2015-03-21 | Discharge: 2015-03-21 | Disposition: A | Discharge status: Home / Self Care | Payer: Medicare Other | Source: Ambulatory Visit | Attending: Internal Medicine | Admitting: Internal Medicine

## 2015-03-21 ENCOUNTER — Ambulatory Visit (INDEPENDENT_AMBULATORY_CARE_PROVIDER_SITE_OTHER): Payer: Medicare Other

## 2015-03-21 DIAGNOSIS — Z0000 Encounter for general adult medical examination without abnormal findings: Secondary | ICD-10-CM

## 2015-03-21 DIAGNOSIS — Z23 Encounter for immunization: Secondary | ICD-10-CM

## 2015-03-21 DIAGNOSIS — Z13820 Encounter for screening for osteoporosis: Secondary | ICD-10-CM

## 2015-03-22 ENCOUNTER — Other Ambulatory Visit: Payer: Medicare Other

## 2015-05-16 ENCOUNTER — Telehealth: Payer: Self-pay | Admitting: Internal Medicine

## 2015-05-16 ENCOUNTER — Ambulatory Visit (INDEPENDENT_AMBULATORY_CARE_PROVIDER_SITE_OTHER): Payer: Medicare Other | Admitting: Internal Medicine

## 2015-05-16 ENCOUNTER — Encounter: Payer: Self-pay | Admitting: Internal Medicine

## 2015-05-16 ENCOUNTER — Other Ambulatory Visit (INDEPENDENT_AMBULATORY_CARE_PROVIDER_SITE_OTHER): Payer: Medicare Other

## 2015-05-16 VITALS — BP 100/66 | HR 80 | Temp 98.1°F | Resp 14 | Ht 67.0 in | Wt 136.0 lb

## 2015-05-16 DIAGNOSIS — Z0000 Encounter for general adult medical examination without abnormal findings: Secondary | ICD-10-CM | POA: Diagnosis not present

## 2015-05-16 DIAGNOSIS — H20023 Recurrent acute iridocyclitis, bilateral: Secondary | ICD-10-CM

## 2015-05-16 LAB — CBC WITH DIFFERENTIAL/PLATELET
BASOS ABS: 0 10*3/uL (ref 0.0–0.1)
Basophils Relative: 0.5 % (ref 0.0–3.0)
EOS ABS: 0.1 10*3/uL (ref 0.0–0.7)
Eosinophils Relative: 2 % (ref 0.0–5.0)
HEMATOCRIT: 41.1 % (ref 36.0–46.0)
Hemoglobin: 13.6 g/dL (ref 12.0–15.0)
LYMPHS ABS: 0.9 10*3/uL (ref 0.7–4.0)
LYMPHS PCT: 26.3 % (ref 12.0–46.0)
MCHC: 33 g/dL (ref 30.0–36.0)
MCV: 93.5 fl (ref 78.0–100.0)
Monocytes Absolute: 0.3 10*3/uL (ref 0.1–1.0)
Monocytes Relative: 8.5 % (ref 3.0–12.0)
NEUTROS ABS: 2.3 10*3/uL (ref 1.4–7.7)
NEUTROS PCT: 62.7 % (ref 43.0–77.0)
PLATELETS: 253 10*3/uL (ref 150.0–400.0)
RBC: 4.39 Mil/uL (ref 3.87–5.11)
RDW: 13.8 % (ref 11.5–15.5)
WBC: 3.6 10*3/uL — ABNORMAL LOW (ref 4.0–10.5)

## 2015-05-16 LAB — LIPID PANEL
Cholesterol: 238 mg/dL — ABNORMAL HIGH (ref 0–200)
HDL: 93.1 mg/dL (ref >39.00)
LDL Cholesterol: 138 mg/dL — ABNORMAL HIGH (ref 0–99)
NONHDL: 145.09
Total CHOL/HDL Ratio: 3
Triglycerides: 36 mg/dL (ref 0.0–149.0)
VLDL: 7.2 mg/dL (ref 0.0–40.0)

## 2015-05-16 LAB — COMPREHENSIVE METABOLIC PANEL
ALBUMIN: 4.2 g/dL (ref 3.5–5.2)
ALT: 18 U/L (ref 0–35)
AST: 24 U/L (ref 0–37)
Alkaline Phosphatase: 58 U/L (ref 39–117)
BILIRUBIN TOTAL: 0.6 mg/dL (ref 0.2–1.2)
BUN: 19 mg/dL (ref 6–23)
CALCIUM: 9.8 mg/dL (ref 8.4–10.5)
CO2: 29 meq/L (ref 19–32)
CREATININE: 0.79 mg/dL (ref 0.40–1.20)
Chloride: 106 mEq/L (ref 96–112)
GFR: 92.34 mL/min (ref >60.00)
Glucose, Bld: 90 mg/dL (ref 70–99)
Potassium: 4 mEq/L (ref 3.5–5.1)
Sodium: 141 mEq/L (ref 135–145)
Total Protein: 6.9 g/dL (ref 6.0–8.3)

## 2015-05-16 LAB — PROTIME-INR
INR: 1.1 ratio — AB (ref 0.8–1.0)
PROTHROMBIN TIME: 11.7 s (ref 9.6–13.1)

## 2015-05-16 LAB — APTT: APTT: 33.8 s — AB (ref 23.4–32.7)

## 2015-05-16 NOTE — Telephone Encounter (Signed)
Pt came back to front desk after her CPE with you this am.  She is concerned about the red blood from her R nostril and is wondering if that is normal or not/ could be related to eye issue??  Does she need to come back for you to check this out?  Also, she left a urine specimen in the lab and wanted you to know.

## 2015-05-16 NOTE — Telephone Encounter (Signed)
We do not need a urine specimen, so the lab can discard. If she wants to be evaluated for the new problem we did not discuss then she can feel free to schedule a visit.

## 2015-05-16 NOTE — Patient Instructions (Addendum)
Next time you are at the eye doctor have them send [place] the notes so we can stay in the loop.   Health Maintenance, Female Adopting a healthy lifestyle and getting preventive care can go a long way to promote health and wellness. Talk with your health care provider about what schedule of regular examinations is right for you. This is a good chance for you to check in with your provider about disease prevention and staying healthy. In between checkups, there are plenty of things you can do on your own. Experts have done a lot of research about which lifestyle changes and preventive measures are most likely to keep you healthy. Ask your health care provider for more information. WEIGHT AND DIET  Eat a healthy diet  Be sure to include plenty of vegetables, fruits, low-fat dairy products, and lean protein.  Do not eat a lot of foods high in solid fats, added sugars, or salt.  Get regular exercise. This is one of the most important things you can do for your health.  Most adults should exercise for at least 150 minutes each week. The exercise should increase your heart rate and make you sweat (moderate-intensity exercise).  Most adults should also do strengthening exercises at least twice a week. This is in addition to the moderate-intensity exercise.  Maintain a healthy weight  Body mass index (BMI) is a measurement that can be used to identify possible weight problems. It estimates body fat based on height and weight. Your health care provider can help determine your BMI and help you achieve or maintain a healthy weight.  For females 3 years of age and older:   A BMI below 18.5 is considered underweight.  A BMI of 18.5 to 24.9 is normal.  A BMI of 25 to 29.9 is considered overweight.  A BMI of 30 and above is considered obese.  Watch levels of cholesterol and blood lipids  You should start having your blood tested for lipids and cholesterol at 71 years of age, then have this test every  5 years.  You may need to have your cholesterol levels checked more often if:  Your lipid or cholesterol levels are high.  You are older than 71 years of age.  You are at high risk for heart disease.  CANCER SCREENING   Lung Cancer  Lung cancer screening is recommended for adults 67-8 years old who are at high risk for lung cancer because of a history of smoking.  A yearly low-dose CT scan of the lungs is recommended for people who:  Currently smoke.  Have quit within the past 15 years.  Have at least a 30-pack-year history of smoking. A pack year is smoking an average of one pack of cigarettes a day for 1 year.  Yearly screening should continue until it has been 15 years since you quit.  Yearly screening should stop if you develop a health problem that would prevent you from having lung cancer treatment.  Breast Cancer  Practice breast self-awareness. This means understanding how your breasts normally appear and feel.  It also means doing regular breast self-exams. Let your health care provider know about any changes, no matter how small.  If you are in your 20s or 30s, you should have a clinical breast exam (CBE) by a health care provider every 1-3 years as part of a regular health exam.  If you are 68 or older, have a CBE every year. Also consider having a breast X-ray (mammogram) every  year.  If you have a family history of breast cancer, talk to your health care provider about genetic screening.  If you are at high risk for breast cancer, talk to your health care provider about having an MRI and a mammogram every year.  Breast cancer gene (BRCA) assessment is recommended for women who have family members with BRCA-related cancers. BRCA-related cancers include:  Breast.  Ovarian.  Tubal.  Peritoneal cancers.  Results of the assessment will determine the need for genetic counseling and BRCA1 and BRCA2 testing. Cervical Cancer Your health care provider may  recommend that you be screened regularly for cancer of the pelvic organs (ovaries, uterus, and vagina). This screening involves a pelvic examination, including checking for microscopic changes to the surface of your cervix (Pap test). You may be encouraged to have this screening done every 3 years, beginning at age 3.  For women ages 53-65, health care providers may recommend pelvic exams and Pap testing every 3 years, or they may recommend the Pap and pelvic exam, combined with testing for human papilloma virus (HPV), every 5 years. Some types of HPV increase your risk of cervical cancer. Testing for HPV may also be done on women of any age with unclear Pap test results.  Other health care providers may not recommend any screening for nonpregnant women who are considered low risk for pelvic cancer and who do not have symptoms. Ask your health care provider if a screening pelvic exam is right for you.  If you have had past treatment for cervical cancer or a condition that could lead to cancer, you need Pap tests and screening for cancer for at least 20 years after your treatment. If Pap tests have been discontinued, your risk factors (such as having a new sexual partner) need to be reassessed to determine if screening should resume. Some women have medical problems that increase the chance of getting cervical cancer. In these cases, your health care provider may recommend more frequent screening and Pap tests. Colorectal Cancer  This type of cancer can be detected and often prevented.  Routine colorectal cancer screening usually begins at 71 years of age and continues through 71 years of age.  Your health care provider may recommend screening at an earlier age if you have risk factors for colon cancer.  Your health care provider may also recommend using home test kits to check for hidden blood in the stool.  A small camera at the end of a tube can be used to examine your colon directly  (sigmoidoscopy or colonoscopy). This is done to check for the earliest forms of colorectal cancer.  Routine screening usually begins at age 78.  Direct examination of the colon should be repeated every 5-10 years through 71 years of age. However, you may need to be screened more often if early forms of precancerous polyps or small growths are found. Skin Cancer  Check your skin from head to toe regularly.  Tell your health care provider about any new moles or changes in moles, especially if there is a change in a mole's shape or color.  Also tell your health care provider if you have a mole that is larger than the size of a pencil eraser.  Always use sunscreen. Apply sunscreen liberally and repeatedly throughout the day.  Protect yourself by wearing long sleeves, pants, a wide-brimmed hat, and sunglasses whenever you are outside. HEART DISEASE, DIABETES, AND HIGH BLOOD PRESSURE   High blood pressure causes heart disease and increases  the risk of stroke. High blood pressure is more likely to develop in:  People who have blood pressure in the high end of the normal range (130-139/85-89 mm Hg).  People who are overweight or obese.  People who are African American.  If you are 45-69 years of age, have your blood pressure checked every 3-5 years. If you are 72 years of age or older, have your blood pressure checked every year. You should have your blood pressure measured twice--once when you are at a hospital or clinic, and once when you are not at a hospital or clinic. Record the average of the two measurements. To check your blood pressure when you are not at a hospital or clinic, you can use:  An automated blood pressure machine at a pharmacy.  A home blood pressure monitor.  If you are between 88 years and 45 years old, ask your health care provider if you should take aspirin to prevent strokes.  Have regular diabetes screenings. This involves taking a blood sample to check your  fasting blood sugar level.  If you are at a normal weight and have a low risk for diabetes, have this test once every three years after 71 years of age.  If you are overweight and have a high risk for diabetes, consider being tested at a younger age or more often. PREVENTING INFECTION  Hepatitis B  If you have a higher risk for hepatitis B, you should be screened for this virus. You are considered at high risk for hepatitis B if:  You were born in a country where hepatitis B is common. Ask your health care provider which countries are considered high risk.  Your parents were born in a high-risk country, and you have not been immunized against hepatitis B (hepatitis B vaccine).  You have HIV or AIDS.  You use needles to inject street drugs.  You live with someone who has hepatitis B.  You have had sex with someone who has hepatitis B.  You get hemodialysis treatment.  You take certain medicines for conditions, including cancer, organ transplantation, and autoimmune conditions. Hepatitis C  Blood testing is recommended for:  Everyone born from [birth year] through 1966.  Anyone with known risk factors for hepatitis C. Sexually transmitted infections (STIs)  You should be screened for sexually transmitted infections (STIs) including gonorrhea and chlamydia if:  You are sexually active and are younger than 71 years of age.  You are older than 71 years of age and your health care provider tells you that you are at risk for this type of infection.  Your sexual activity has changed since you were last screened and you are at an increased risk for chlamydia or gonorrhea. Ask your health care provider if you are at risk.  If you do not have HIV, but are at risk, it may be recommended that you take a prescription medicine daily to prevent HIV infection. This is called pre-exposure prophylaxis (PrEP). You are considered at risk if:  You are sexually active and do not regularly use condoms or  know the HIV status of your partner(s).  You take drugs by injection.  You are sexually active with a partner who has HIV. Talk with your health care provider about whether you are at high risk of being infected with HIV. If you choose to begin PrEP, you should first be tested for HIV. You should then be tested every 3 months for as long as you are taking PrEP.  PREGNANCY  If you are premenopausal and you may become pregnant, ask your health care provider about preconception counseling.  If you may become pregnant, take 400 to 800 micrograms (mcg) of folic acid every day.  If you want to prevent pregnancy, talk to your health care provider about birth control (contraception). OSTEOPOROSIS AND MENOPAUSE   Osteoporosis is a disease in which the bones lose minerals and strength with aging. This can result in serious bone fractures. Your risk for osteoporosis can be identified using a bone density scan.  If you are 36 years of age or older, or if you are at risk for osteoporosis and fractures, ask your health care provider if you should be screened.  Ask your health care provider whether you should take a calcium or vitamin D supplement to lower your risk for osteoporosis.  Menopause may have certain physical symptoms and risks.  Hormone replacement therapy may reduce some of these symptoms and risks. Talk to your health care provider about whether hormone replacement therapy is right for you.  HOME CARE INSTRUCTIONS   Schedule regular health, dental, and eye exams.  Stay current with your immunizations.   Do not use any tobacco products including cigarettes, chewing tobacco, or electronic cigarettes.  If you are pregnant, do not drink alcohol.  If you are breastfeeding, limit how much and how often you drink alcohol.  Limit alcohol intake to no more than 1 drink per day for nonpregnant women. One drink equals 12 ounces of beer, 5 ounces of wine, or 1 ounces of hard liquor.  Do  not use street drugs.  Do not share needles.  Ask your health care provider for help if you need support or information about quitting drugs.  Tell your health care provider if you often feel depressed.  Tell your health care provider if you have ever been abused or do not feel safe at home.   This information is not intended to replace advice given to you by your health care provider. Make sure you discuss any questions you have with your health care provider.   Document Released: 11/11/2010 Document Revised: 05/19/2014 Document Reviewed: 03/30/2013 Elsevier Interactive Patient Education [employer].

## 2015-05-16 NOTE — Progress Notes (Signed)
Pre visit review using our clinic review tool, if applicable. No additional management support is needed unless otherwise documented below in the visit note. 

## 2015-05-16 NOTE — Progress Notes (Signed)
   Subjective:    Patient ID: Wendy Serrano, female    DOB: 27-Oct-1945, 70 y.o.   MRN: [id number]  HPI Here for medicare wellness, no new complaints. Please see A/P for status and treatment of chronic medical problems.   Diet: heart healthy Physical activity: sedentary Depression/mood screen: negative Hearing: intact to whispered voice Visual acuity: grossly normal, performs annual eye exam  ADLs: capable Fall risk: none Home safety: good Cognitive evaluation: intact to orientation, naming, recall and repetition EOL planning: adv directives discussed  I have personally reviewed and have noted 1. The patient's medical and social history - reviewed today no changes 2. Their use of alcohol, tobacco or illicit drugs 3. Their current medications and supplements 4. The patient's functional ability including ADL's, fall risks, home safety risks and hearing or visual impairment. 5. Diet and physical activities 6. Evidence for depression or mood disorders 7. Care team reviewed and updated (available in snapshot)  Review of Systems  Constitutional: Negative for fever, activity change, appetite change, fatigue and unexpected weight change.  HENT: Negative.   Eyes: Negative.   Respiratory: Negative for cough, chest tightness, shortness of breath and wheezing.   Cardiovascular: Negative for chest pain, palpitations and leg swelling.  Gastrointestinal: Negative for nausea, abdominal pain, diarrhea, constipation and abdominal distention.  Musculoskeletal: Negative.   Skin: Negative.   Neurological: Negative.   Psychiatric/Behavioral: Negative.       Objective:   Physical Exam  Constitutional: She is oriented to person, place, and time. She appears well-developed and well-nourished.  HENT:  Head: Normocephalic and atraumatic.  Eyes: EOM are normal.  Neck: Normal range of motion.  Cardiovascular: Normal rate and regular rhythm.   Pulmonary/Chest: Effort normal and breath sounds  normal. No respiratory distress. She has no wheezes. Right breast exhibits no inverted nipple, no mass, no nipple discharge, no skin change and no tenderness. Left breast exhibits no inverted nipple, no mass, no nipple discharge, no skin change and no tenderness. Breasts are symmetrical.  Abdominal: Soft. Bowel sounds are normal. She exhibits no distension. There is no tenderness. There is no rebound.  Musculoskeletal: She exhibits no edema.  Neurological: She is alert and oriented to person, place, and time. Coordination normal.  Skin: Skin is warm and dry.  Nursing note and vitals reviewed.  Filed Vitals:   05/16/15 1015  BP: 100/66  Pulse: 80  Temp: 98.1 F (36.7 C)  TempSrc: Oral  Resp: 14  Height: 5\' 7"  (1.702 m)  Weight: 136 lb (61.689 kg)  SpO2: 98%      Assessment & Plan:  Fax results [phone] Dr Manuella Ghazi, [place]

## 2015-05-17 LAB — HEPATITIS C ANTIBODY: HCV AB: NEGATIVE

## 2015-05-18 ENCOUNTER — Other Ambulatory Visit: Payer: Medicare Other

## 2015-05-18 NOTE — Assessment & Plan Note (Signed)
Colonoscopy and mammogram are up to date. Pneumonia and shingles shot completed. Flu shot this year done. Tetanus in date. Getting hepatitis C screening today. Checking labs, counseled her on the importance of daily exercise on staying active and keeping arthritis good. Given 10 year screening recommendations. BP good, non-smoker.

## 2015-05-18 NOTE — Telephone Encounter (Signed)
Patient aware and will schedule an office visit.  

## 2015-05-29 ENCOUNTER — Telehealth: Payer: Self-pay | Admitting: Internal Medicine

## 2015-05-29 DIAGNOSIS — H579 Unspecified disorder of eye and adnexa: Secondary | ICD-10-CM

## 2015-05-29 NOTE — Telephone Encounter (Signed)
Pt called in stating you will receive a letter from her optometrist Dr. Manuella Ghazi stating pt needs to see a hematologist asap.  She sees this doctor next week and would like to see the hematologist before then Pt can be reached at [phone]

## 2015-05-30 NOTE — Telephone Encounter (Signed)
Do we know why the eye doctor needs the referral? We need to know this in order to judge the urgency. Also the hematologist would need to have the records from eye doctor to do their job and know what to talk to her about at the visit.

## 2015-05-30 NOTE — Telephone Encounter (Signed)
According to the caller, Dr. Trena Platt office was to get a copy of the notes to [place] ASAP. I do not have a track on this paper however,. You may already have it, or we may not

## 2015-05-30 NOTE — Telephone Encounter (Signed)
Patient's husband called in to follow up. He wanted to stress that there is a sense of urgency to get the patient with a hematologist. He requests that we get this done within the next day or so. Advised that i would convey the message of urgency

## 2015-05-31 ENCOUNTER — Telehealth: Payer: Self-pay | Admitting: Oncology

## 2015-05-31 NOTE — Telephone Encounter (Signed)
I have placed the order urgently but she will need to get those records before her hematology visit and take them. She may have to go to the eye doctor's office and pick them up. They have not been sent to [place] yet. Able to see care everywhere in epic and see no indication that they wanted her to see hematology in last note and no new phone notes.

## 2015-05-31 NOTE — Telephone Encounter (Signed)
I spoke with patient and she is going to go pick up the records from her eye doctor to take to her appt with hematology. I will go follow up with the referral dept to make sure the appt has been scheduled because it was placed urgently.

## 2015-05-31 NOTE — Telephone Encounter (Signed)
Pt confirmed appt for 1/20.

## 2015-06-01 ENCOUNTER — Ambulatory Visit (HOSPITAL_BASED_OUTPATIENT_CLINIC_OR_DEPARTMENT_OTHER): Payer: Medicare Other | Admitting: Oncology

## 2015-06-01 VITALS — BP 121/61 | HR 78 | Temp 97.9°F | Resp 17 | Ht 67.0 in | Wt 139.1 lb

## 2015-06-01 DIAGNOSIS — D72819 Decreased white blood cell count, unspecified: Secondary | ICD-10-CM

## 2015-06-01 NOTE — Progress Notes (Signed)
Please see consult note.  

## 2015-06-01 NOTE — Consult Note (Signed)
Reason for Referral: Leukocytopenia.   HPI: This is a pleasant 70 year old woman currently of [place] where she currently resides. She is a rather healthy woman without any significant comorbid conditions and remains relatively active. She started developing visual problems in December 2015 and was diagnosed with iritis. She had been seen by rheumatology in the past and currently receive follow-up and care with Dr. Manuella Ghazi at the [facility]. She had been on topical ophthalmic treatments for the last year and appears to have progressed in both eyes. Recommendation for possible immunosuppressive therapy with methotrexate, CellCept or azathioprine is contemplated. She was referred to me for evaluation regarding her previous history of leukocytopenia prior to start of these medications. Her white cell count have ranged between 3.3 and 4.4 in the last 9 years. Dating back to 2008, her laboratory testing were reviewed and showed mild and rather fluctuating leukocytopenia. Her differential have been perfectly normal all the time. She also had normal chemistries and liver function tests. She is asymptomatic otherwise. She has not reported any arthralgias or myalgias. Has not reported any constitutional symptoms. She remains active and performs activities of daily living.   She does not report any headaches, dizziness, syncope or seizures. She does not report any fevers, chills or sweats. She does not report any cough, wheezing or hemoptysis. She does not report any nausea, vomiting, abdominal pain, constipation or diarrhea. She does not report any chest pain, palpitation or leg edema. She does not report any frequency urgency or hesitancy. She does not report any skeletal complaints. Remaining review of systems unremarkable.   Past Medical History  Diagnosis Date  . Leukopenia     chronic  . 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 Allergic rhinitis   . Eczema     childhood  .  Rotator cuff tear   . 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 H/O varicella   . History of measles, mumps, or rubella   . Yeast infection   . H/O osteopenia 01/09/04  . Menopausal symptoms   . H/O osteoporosis 2008  . 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 Renal cyst     bilateral  . Liver hemangioma   :  Past Surgical History  Procedure Laterality Date  . Rotator cuff repair Left 08/2009    Left shoulder-rotator cuff repair [facility])  . Wisdom tooth extraction    :   Current outpatient prescriptions:  .  acetaminophen (TYLENOL) 650 MG CR tablet, Take 650 mg by mouth every 8 (eight) hours as needed for pain., Disp: , Rfl:  .  calcium-vitamin D (OSCAL WITH D) 500-200 MG-UNIT per tablet, Take 1 tablet by mouth 2 (two) times daily.  , Disp: , Rfl:  .  Garlic TABS, Take 2 tablets by mouth 2 (two) times daily. Kyolic, Disp: , Rfl:  .  glucosamine-chondroitin 500-400 MG tablet, Take 1 tablet by mouth 2 (two) times daily., Disp: , Rfl:  .  hydrocortisone valerate cream (WESTCORT) 0.2 %, Apply topically 2 (two) times daily., Disp: 45 g, Rfl: 2 .  Multiple Vitamin (MULTIVITAMIN) tablet, Take 1 tablet by mouth daily.  , Disp: , Rfl:  .  valACYclovir (VALTREX) 1000 MG tablet, Take 1 tablet (1,000 mg total) by mouth 2 (two) times daily. (Patient not taking: Reported on 05/16/2015), Disp: 20 tablet, Rfl: 3:  No Known Allergies:  Family History  Problem Relation Age of Onset  . Prostate cancer Father  died  . Pancreatic cancer Mother     died  . Diabetes Mother   . Hyperlipidemia Sister   :  Social History   Social History  . Marital Status: Married    Spouse Name: N/A  . Number of Children: 0  . Years of Education: N/A   Occupational History  . Retired    Social History Main Topics  . Smoking status: Never Smoker   . Smokeless tobacco: Never Used  . Alcohol Use: No  . Drug Use: No  . Sexual Activity: No   Other Topics Concern  . Not on file   Social History  Narrative   [employer] BA; MEd-teaching;MEd Administration @ [employer]   Married '67   Has 11 children who were foster care or mentored   Retired   [place] retired, Has had some health problems. They are doing well   Marriage is in good health and retirement is good   End-of-life: provided packet of information and forms for consideration (Oct'11)  :  Pertinent items are noted in HPI.  Exam: Blood pressure 121/61, pulse 78, temperature 97.9 F (36.6 C), temperature source Oral, resp. rate 17, height '5\' 7"'$  (1.702 m), weight 139 lb 1.6 oz (63.095 kg), SpO2 100 %. General appearance: alert and cooperative Head: Normocephalic, without obvious abnormality Nose: Nares normal. Septum midline. Mucosa normal. No drainage or sinus tenderness. Throat: lips, mucosa, and tongue normal; teeth and gums normal Neck: no adenopathy Back: negative Resp: clear to auscultation bilaterally Chest wall: no tenderness Cardio: regular rate and rhythm, S1, S2 normal, no murmur, click, rub or gallop GI: soft, non-tender; bowel sounds normal; no masses,  no organomegaly Extremities: extremities normal, atraumatic, no cyanosis or edema Pulses: 2+ and symmetric Skin: Skin color, texture, turgor normal. No rashes or lesions Lymph nodes: Cervical, supraclavicular, and axillary nodes normal.  CBC    Component Value Date/Time   WBC 3.6* 05/16/2015 1203   RBC 4.39 05/16/2015 1203   HGB 13.6 05/16/2015 1203   HCT 41.1 05/16/2015 1203   PLT 253.0 05/16/2015 1203   MCV 93.5 05/16/2015 1203   MCHC 33.0 05/16/2015 1203   RDW 13.8 05/16/2015 1203   LYMPHSABS 0.9 05/16/2015 1203   MONOABS 0.3 05/16/2015 1203   EOSABS 0.1 05/16/2015 1203   BASOSABS 0.0 05/16/2015 1203      Chemistry      Component Value Date/Time   NA 141 05/16/2015 1203   K 4.0 05/16/2015 1203   CL 106 05/16/2015 1203   CO2 29 05/16/2015 1203   BUN 19 05/16/2015 1203   CREATININE 0.79 05/16/2015 1203      Component Value Date/Time    CALCIUM 9.8 05/16/2015 1203   ALKPHOS 58 05/16/2015 1203   AST 24 05/16/2015 1203   ALT 18 05/16/2015 1203   BILITOT 0.6 05/16/2015 1203      Assessment and Plan:   70 year old woman with the following issues:  1. Leukocytopenia: This is a chronic and fluctuating finding dating back to at least 2008. These findings was discussed with the patient and her husband accompanied her today. Her white cell count have been within normal range at times but slightly lower. Her differential is perfectly normal with proportional neutrophils, lymphocytes and basophils. These findings likely represent benign fluctuations and likely ethnic variation. Autoimmune leukocytopenia as a possibility but it is less likely.  I do not see any signs to suggest a blood disorder or any intervention at this time. He does not need any further  workup or bone marrow biopsy.  2. Iritis: She is under consideration to receive immunosuppressive therapy with methotrexate, CellCept or azathioprine. I have no contraindication from a hematological standpoint. Her laboratory testing including CBC and differential indicate perfectly normal and healthy bone marrow and no objections from a hematological standpoint.  She understands any of these medication can cause leukocytopenia and frequent monitoring will be needed by the prescribing physician.  All questions are answered to their satisfaction.

## 2015-07-24 DIAGNOSIS — H35372 Puckering of macula, left eye: Secondary | ICD-10-CM | POA: Insufficient documentation

## 2015-07-24 DIAGNOSIS — Z79899 Other long term (current) drug therapy: Secondary | ICD-10-CM | POA: Insufficient documentation

## 2015-08-23 ENCOUNTER — Telehealth: Payer: Self-pay | Admitting: Internal Medicine

## 2015-08-23 ENCOUNTER — Ambulatory Visit (INDEPENDENT_AMBULATORY_CARE_PROVIDER_SITE_OTHER): Payer: Medicare Other | Admitting: Internal Medicine

## 2015-08-23 ENCOUNTER — Encounter: Payer: Self-pay | Admitting: Internal Medicine

## 2015-08-23 VITALS — BP 140/70 | HR 79 | Wt 138.0 lb

## 2015-08-23 DIAGNOSIS — H8110 Benign paroxysmal vertigo, unspecified ear: Secondary | ICD-10-CM | POA: Insufficient documentation

## 2015-08-23 DIAGNOSIS — M25562 Pain in left knee: Secondary | ICD-10-CM | POA: Diagnosis not present

## 2015-08-23 DIAGNOSIS — H8112 Benign paroxysmal vertigo, left ear: Secondary | ICD-10-CM | POA: Diagnosis not present

## 2015-08-23 NOTE — Progress Notes (Signed)
Subjective:  Patient ID: Rylinn Hishmeh, female    DOB: 19-Aug-1945  Age: 70 y.o. MRN: [id number]  CC: No chief complaint on file.   HPI Aireonna Romig presents for vertigo - spinning room x 1 night and resolved 2 wks ago. C/o L knee pain and OA Pt has iritis - using 3 kinds of drop and PO MTX  Outpatient Prescriptions Prior to Visit  Medication Sig Dispense Refill  . acetaminophen (TYLENOL) 650 MG CR tablet Take 650 mg by mouth every 8 (eight) hours as needed for pain.    . calcium-vitamin D (OSCAL WITH D) 500-200 MG-UNIT per tablet Take 1 tablet by mouth 2 (two) times daily.      . Garlic TABS Take 2 tablets by mouth 2 (two) times daily. Kyolic    . glucosamine-chondroitin 500-400 MG tablet Take 1 tablet by mouth 2 (two) times daily.    . hydrocortisone valerate cream (WESTCORT) 0.2 % Apply topically 2 (two) times daily. 45 g 2  . Multiple Vitamin (MULTIVITAMIN) tablet Take 1 tablet by mouth daily.      . valACYclovir (VALTREX) 1000 MG tablet Take 1 tablet (1,000 mg total) by mouth 2 (two) times daily. 20 tablet 3   No facility-administered medications prior to visit.    ROS Review of Systems  Constitutional: Negative for chills, activity change, appetite change, fatigue and unexpected weight change.  HENT: Negative for congestion, mouth sores and sinus pressure.   Eyes: Negative for visual disturbance.  Respiratory: Negative for cough and chest tightness.   Gastrointestinal: Negative for nausea and abdominal pain.  Genitourinary: Negative for frequency, difficulty urinating and vaginal pain.  Musculoskeletal: Positive for gait problem. Negative for back pain.  Skin: Negative for pallor and rash.  Neurological: Negative for dizziness, tremors, facial asymmetry, weakness, numbness and headaches.  Psychiatric/Behavioral: Negative for confusion and sleep disturbance.    Objective:  BP 140/70 mmHg  Pulse 79  Wt 138 lb (62.596 kg)  SpO2 99%  BP Readings from Last 3  Encounters:  08/23/15 140/70  06/01/15 121/61  05/16/15 100/66    Wt Readings from Last 3 Encounters:  08/23/15 138 lb (62.596 kg)  06/01/15 139 lb 1.6 oz (63.095 kg)  05/16/15 136 lb (61.689 kg)    Physical Exam  Constitutional: She appears well-developed. No distress.  HENT:  Head: Normocephalic.  Right Ear: External ear normal.  Left Ear: External ear normal.  Nose: Nose normal.  Mouth/Throat: Oropharynx is clear and moist.  Eyes: Conjunctivae are normal. Pupils are equal, round, and reactive to light. Right eye exhibits no discharge. Left eye exhibits no discharge.  Neck: Normal range of motion. Neck supple. No JVD present. No tracheal deviation present. No thyromegaly present.  Cardiovascular: Normal rate, regular rhythm and normal heart sounds.   Pulmonary/Chest: No stridor. No respiratory distress. She has no wheezes.  Abdominal: Soft. Bowel sounds are normal. She exhibits no distension and no mass. There is no tenderness. There is no rebound and no guarding.  Musculoskeletal: She exhibits tenderness. She exhibits no edema.  Lymphadenopathy:    She has no cervical adenopathy.  Neurological: She displays normal reflexes. No cranial nerve deficit. She exhibits normal muscle tone. Coordination normal.  Skin: No rash noted. No erythema.  Psychiatric: She has a normal mood and affect. Her behavior is normal. Judgment and thought content normal.  L knee is tender w/ROM H-P (-) B  Lab Results  Component Value Date   WBC 3.6* 05/16/2015   HGB 13.6 05/16/2015  HCT 41.1 05/16/2015   PLT 253.0 05/16/2015   GLUCOSE 90 05/16/2015   CHOL 238* 05/16/2015   TRIG 36.0 05/16/2015   HDL 93.10 05/16/2015   LDLDIRECT 127.9 03/09/2013   LDLCALC 138* 05/16/2015   ALT 18 05/16/2015   AST 24 05/16/2015   NA 141 05/16/2015   K 4.0 05/16/2015   CL 106 05/16/2015   CREATININE 0.79 05/16/2015   BUN 19 05/16/2015   CO2 29 05/16/2015   TSH 1.92 02/26/2011   INR 1.1* 05/16/2015    HGBA1C 5.4 02/26/2011    Dg Bone Density  03/28/2015  Date of study: 03/21/2015 Exam: DUAL X-RAY ABSORPTIOMETRY (DXA) FOR BONE MINERAL DENSITY (BMD) Instrument: [employer] Requesting Provider: PCP Indication: Follow-up for osteoporosis Comparison: 02/21/2010 Clinical data: Pt is a 70 y.o. female without h/o fractures. On calcium and vitamin D. On estradiol. Results:  Lumbar spine L1-L2 (L3, L4) Femoral neck (FN) T-score  -1.0  RFN: -1.7 LFN: -2.6  Change in BMD from previous DXA test (%)  -3.6%   -3.0%  (*) statistically significant Assessment: Patient has OSTEOPOROSIS according to the [facility] classification for osteoporosis (see below). Fracture risk: high Comments: the technical quality of the study is good, however, the spine is scoliotic and the vertebrae L3 and L4 had to be excluded from analysis. Evaluation for secondary causes should be considered if clinically indicated. Recommend optimizing calcium (1200 mg/day) and vitamin D (800 IU/day). Followup: Repeat BMD is appropriate after 2 years or after 1-2 years if starting treatment. WHO criteria for diagnosis of osteoporosis in postmenopausal women and in men 65 y/o or older: - normal: T-score -1.0 to + 1.0 - osteopenia/low bone density: T-score between -2.5 and -1.0 - osteoporosis: T-score below -2.5 - severe osteoporosis: T-score below -2.5 with history of fragility fracture Note: although not part of the WHO classification, the presence of a fragility fracture, regardless of the T-score, should be considered diagnostic of osteoporosis, provided other causes for the fracture have been excluded. Treatment: The National Osteoporosis Foundation recommends that treatment be considered in postmenopausal women and men age 41 or older with: 1. Hip or vertebral (clinical or morphometric) fracture 2. T-score of - 2.5 or lower at the spine or hip 3. 10-year fracture probability by FRAX of at least 20% for a major osteoporotic fracture and 3% for a hip  fracture Philemon Kingdom, MD [facility]    Assessment & Plan:   There are no diagnoses linked to this encounter. I am having Ms. Grandville Silos maintain her multivitamin, calcium-vitamin D, Garlic, glucosamine-chondroitin, acetaminophen, hydrocortisone valerate cream, valACYclovir, SIMBRINZA, fluorometholone, latanoprost, folic acid, and methotrexate.  Meds ordered this encounter  Medications  . SIMBRINZA 1-0.2 % SUSP    Sig: Place 1 drop into both eyes 3 (three) times daily.    Refill:  6  . fluorometholone (FML) 0.1 % ophthalmic suspension    Sig: Place 1 drop into both eyes 4 (four) times daily.    Refill:  3  . latanoprost (XALATAN) 0.005 % ophthalmic solution    Sig: Place 1 drop into both eyes at bedtime.    Refill:  5  . folic acid (FOLVITE) 1 MG tablet    Sig: Take 1 mg by mouth daily.  . methotrexate (RHEUMATREX) 2.5 MG tablet    Sig: TAKE 5 TABLETS BY MOUTH ONCE WEEKLY AS DIRECTED    Refill:  3     Follow-up: No Follow-up on file.  Walker Kehr, MD

## 2015-08-23 NOTE — Progress Notes (Signed)
Pre visit review using our clinic review tool, if applicable. No additional management support is needed unless otherwise documented below in the visit note. 

## 2015-08-23 NOTE — Assessment & Plan Note (Signed)
Acute Aleve

## 2015-08-23 NOTE — Patient Instructions (Signed)
Benign Positional Vertigo Vertigo is the feeling that you or your surroundings are moving when they are not. Benign positional vertigo is the most common form of vertigo. The cause of this condition is not serious (is benign). This condition is triggered by certain movements and positions (is positional). This condition can be dangerous if it occurs while you are doing something that could endanger you or others, such as driving.  CAUSES In many cases, the cause of this condition is not known. It may be caused by a disturbance in an area of the inner ear that helps your brain to sense movement and balance. This disturbance can be caused by a viral infection (labyrinthitis), head injury, or repetitive motion. RISK FACTORS This condition is more likely to develop in:  Women.  People who are 50 years of age or older. SYMPTOMS Symptoms of this condition usually happen when you move your head or your eyes in different directions. Symptoms may start suddenly, and they usually last for less than a minute. Symptoms may include:  Loss of balance and falling.  Feeling like you are spinning or moving.  Feeling like your surroundings are spinning or moving.  Nausea and vomiting.  Blurred vision.  Dizziness.  Involuntary eye movement (nystagmus). Symptoms can be mild and cause only slight annoyance, or they can be severe and interfere with daily life. Episodes of benign positional vertigo may return (recur) over time, and they may be triggered by certain movements. Symptoms may improve over time. DIAGNOSIS This condition is usually diagnosed by medical history and a physical exam of the head, neck, and ears. You may be referred to a health care provider who specializes in ear, nose, and throat (ENT) problems (otolaryngologist) or a provider who specializes in disorders of the nervous system (neurologist). You may have additional testing, including:  MRI.  A CT scan.  Eye movement tests. Your  health care provider may ask you to change positions quickly while he or she watches you for symptoms of benign positional vertigo, such as nystagmus. Eye movement may be tested with an electronystagmogram (ENG), caloric stimulation, the Dix-Hallpike test, or the roll test.  An electroencephalogram (EEG). This records electrical activity in your brain.  Hearing tests. TREATMENT Usually, your health care provider will treat this by moving your head in specific positions to adjust your inner ear back to normal. Surgery may be needed in severe cases, but this is rare. In some cases, benign positional vertigo may resolve on its own in 2-4 weeks. HOME CARE INSTRUCTIONS Safety  Move slowly.Avoid sudden body or head movements.  Avoid driving.  Avoid operating heavy machinery.  Avoid doing any tasks that would be dangerous to you or others if a vertigo episode would occur.  If you have trouble walking or keeping your balance, try using a cane for stability. If you feel dizzy or unstable, sit down right away.  Return to your normal activities as told by your health care provider. Ask your health care provider what activities are safe for you. General Instructions  Take over-the-counter and prescription medicines only as told by your health care provider.  Avoid certain positions or movements as told by your health care provider.  Drink enough fluid to keep your urine clear or pale yellow.  Keep all follow-up visits as told by your health care provider. This is important. SEEK MEDICAL CARE IF:  You have a fever.  Your condition gets worse or you develop new symptoms.  Your family or friends   notice any behavioral changes.  Your nausea or vomiting gets worse.  You have numbness or a "pins and needles" sensation. SEEK IMMEDIATE MEDICAL CARE IF:  You have difficulty speaking or moving.  You are always dizzy.  You faint.  You develop severe headaches.  You have weakness in your  legs or arms.  You have changes in your hearing or vision.  You develop a stiff neck.  You develop sensitivity to light.   This information is not intended to replace advice given to you by your health care provider. Make sure you discuss any questions you have with your health care provider.   Document Released: 02/03/2006 Document Revised: 01/17/2015 Document Reviewed: 08/21/2014 Elsevier Interactive Patient Education 2016 [employer]  

## 2015-08-23 NOTE — Telephone Encounter (Signed)
OK. Thx

## 2015-08-23 NOTE — Telephone Encounter (Signed)
Pt came by and said that you would take her on as a pt Plot?  Is this transfer ok with both of you?

## 2015-08-23 NOTE — Telephone Encounter (Signed)
Fine with me

## 2015-08-26 ENCOUNTER — Encounter: Payer: Self-pay | Admitting: Internal Medicine

## 2016-01-28 ENCOUNTER — Other Ambulatory Visit: Payer: Self-pay | Admitting: Internal Medicine

## 2016-01-28 DIAGNOSIS — Z1231 Encounter for screening mammogram for malignant neoplasm of breast: Secondary | ICD-10-CM

## 2016-02-13 ENCOUNTER — Ambulatory Visit
Admission: RE | Admit: 2016-02-13 | Discharge: 2016-02-13 | Disposition: A | Discharge status: Home / Self Care | Payer: Medicare Other | Source: Ambulatory Visit | Attending: Internal Medicine | Admitting: Internal Medicine

## 2016-02-13 DIAGNOSIS — Z1231 Encounter for screening mammogram for malignant neoplasm of breast: Secondary | ICD-10-CM

## 2016-04-01 ENCOUNTER — Ambulatory Visit (INDEPENDENT_AMBULATORY_CARE_PROVIDER_SITE_OTHER): Payer: Medicare Other

## 2016-04-01 DIAGNOSIS — Z23 Encounter for immunization: Secondary | ICD-10-CM | POA: Diagnosis not present

## 2016-04-08 DIAGNOSIS — H2513 Age-related nuclear cataract, bilateral: Secondary | ICD-10-CM | POA: Insufficient documentation

## 2016-05-19 ENCOUNTER — Encounter: Payer: Medicare Other | Admitting: Internal Medicine

## 2016-05-30 ENCOUNTER — Encounter: Payer: Medicare Other | Admitting: Internal Medicine

## 2016-06-06 ENCOUNTER — Other Ambulatory Visit (INDEPENDENT_AMBULATORY_CARE_PROVIDER_SITE_OTHER): Payer: Medicare Other

## 2016-06-06 ENCOUNTER — Encounter: Payer: Self-pay | Admitting: Internal Medicine

## 2016-06-06 ENCOUNTER — Ambulatory Visit (INDEPENDENT_AMBULATORY_CARE_PROVIDER_SITE_OTHER): Payer: Medicare Other | Admitting: Internal Medicine

## 2016-06-06 VITALS — BP 120/70 | HR 75 | Ht 67.0 in | Wt 139.0 lb

## 2016-06-06 DIAGNOSIS — Z0000 Encounter for general adult medical examination without abnormal findings: Secondary | ICD-10-CM | POA: Diagnosis not present

## 2016-06-06 DIAGNOSIS — H209 Unspecified iridocyclitis: Secondary | ICD-10-CM | POA: Diagnosis not present

## 2016-06-06 LAB — TSH: TSH: 2.17 u[IU]/mL (ref 0.35–4.50)

## 2016-06-06 LAB — URINALYSIS
BILIRUBIN URINE: NEGATIVE
Hgb urine dipstick: NEGATIVE
KETONES UR: NEGATIVE
LEUKOCYTES UA: NEGATIVE
Nitrite: NEGATIVE
PH: 6 (ref 5.0–8.0)
Specific Gravity, Urine: 1.02 (ref 1.000–1.030)
Total Protein, Urine: NEGATIVE
Urine Glucose: NEGATIVE
Urobilinogen, UA: 0.2 (ref 0.0–1.0)

## 2016-06-06 LAB — BASIC METABOLIC PANEL
BUN: 17 mg/dL (ref 6–23)
CALCIUM: 9.5 mg/dL (ref 8.4–10.5)
CO2: 30 mEq/L (ref 19–32)
Chloride: 107 mEq/L (ref 96–112)
Creatinine, Ser: 0.83 mg/dL (ref 0.40–1.20)
GFR: 86.96 mL/min (ref >60.00)
Glucose, Bld: 84 mg/dL (ref 70–99)
POTASSIUM: 3.7 meq/L (ref 3.5–5.1)
SODIUM: 143 meq/L (ref 135–145)

## 2016-06-06 LAB — HEPATITIS C ANTIBODY: HCV Ab: NEGATIVE

## 2016-06-06 LAB — CBC WITH DIFFERENTIAL/PLATELET
BASOS ABS: 0 10*3/uL (ref 0.0–0.1)
Basophils Relative: 0.5 % (ref 0.0–3.0)
Eosinophils Absolute: 0.1 10*3/uL (ref 0.0–0.7)
Eosinophils Relative: 1.5 % (ref 0.0–5.0)
HEMATOCRIT: 38.3 % (ref 36.0–46.0)
Hemoglobin: 12.8 g/dL (ref 12.0–15.0)
LYMPHS PCT: 17.8 % (ref 12.0–46.0)
Lymphs Abs: 0.8 10*3/uL (ref 0.7–4.0)
MCHC: 33.3 g/dL (ref 30.0–36.0)
MCV: 94.4 fl (ref 78.0–100.0)
MONOS PCT: 6.3 % (ref 3.0–12.0)
Monocytes Absolute: 0.3 10*3/uL (ref 0.1–1.0)
NEUTROS PCT: 73.9 % (ref 43.0–77.0)
Neutro Abs: 3.2 10*3/uL (ref 1.4–7.7)
Platelets: 237 10*3/uL (ref 150.0–400.0)
RBC: 4.05 Mil/uL (ref 3.87–5.11)
RDW: 14.3 % (ref 11.5–15.5)
WBC: 4.4 10*3/uL (ref 4.0–10.5)

## 2016-06-06 LAB — HEPATIC FUNCTION PANEL
ALBUMIN: 4.3 g/dL (ref 3.5–5.2)
ALK PHOS: 57 U/L (ref 39–117)
ALT: 16 U/L (ref 0–35)
AST: 22 U/L (ref 0–37)
Bilirubin, Direct: 0.1 mg/dL (ref 0.0–0.3)
Total Bilirubin: 0.7 mg/dL (ref 0.2–1.2)
Total Protein: 7.1 g/dL (ref 6.0–8.3)

## 2016-06-06 LAB — LIPID PANEL
Cholesterol: 225 mg/dL — ABNORMAL HIGH (ref 0–200)
HDL: 91.8 mg/dL (ref >39.00)
LDL Cholesterol: 125 mg/dL — ABNORMAL HIGH (ref 0–99)
NONHDL: 132.86
Total CHOL/HDL Ratio: 2
Triglycerides: 37 mg/dL (ref 0.0–149.0)
VLDL: 7.4 mg/dL (ref 0.0–40.0)

## 2016-06-06 MED ORDER — HYDROCORTISONE VALERATE 0.2 % EX CREA: TOPICAL_CREAM | Freq: Two times a day (BID) | CUTANEOUS | 2 refills | Status: DC

## 2016-06-06 NOTE — Progress Notes (Signed)
Pre visit review using our clinic review tool, if applicable. No additional management support is needed unless otherwise documented below in the visit note. 

## 2016-06-06 NOTE — Assessment & Plan Note (Addendum)
Dr Manuella Ghazi f/u MTX po

## 2016-06-06 NOTE — Progress Notes (Signed)
Subjective:  Patient ID: Wendy Serrano, female    DOB: [birth year]  Age: 71 y.o. MRN: [id number]  CC: No chief complaint on file.   HPI Wendy Serrano presents for well exam  Outpatient Medications Prior to Visit  Medication Sig Dispense Refill  . acetaminophen (TYLENOL) 650 MG CR tablet Take 650 mg by mouth every 8 (eight) hours as needed for pain.    . calcium-vitamin D (OSCAL WITH D) 500-200 MG-UNIT per tablet Take 1 tablet by mouth 2 (two) times daily.      . folic acid (FOLVITE) 1 MG tablet Take 1 mg by mouth daily.    . Garlic TABS Take 2 tablets by mouth 2 (two) times daily. Kyolic    . glucosamine-chondroitin 500-400 MG tablet Take 1 tablet by mouth 2 (two) times daily.    . hydrocortisone valerate cream (WESTCORT) 0.2 % Apply topically 2 (two) times daily. 45 g 2  . latanoprost (XALATAN) 0.005 % ophthalmic solution Place 1 drop into both eyes at bedtime.  5  . methotrexate (RHEUMATREX) 2.5 MG tablet TAKE 5 TABLETS BY MOUTH ONCE WEEKLY AS DIRECTED  3  . Multiple Vitamin (MULTIVITAMIN) tablet Take 1 tablet by mouth daily.      [name] SIMBRINZA 1-0.2 % SUSP Place 1 drop into both eyes 3 (three) times daily.  6  . fluorometholone (FML) 0.1 % ophthalmic suspension Place 1 drop into both eyes 4 (four) times daily.  3  . valACYclovir (VALTREX) 1000 MG tablet Take 1 tablet (1,000 mg total) by mouth 2 (two) times daily. (Patient not taking: Reported on 06/06/2016) 20 tablet 3   No facility-administered medications prior to visit.     ROS Review of Systems  Constitutional: Negative for activity change, appetite change, chills, fatigue and unexpected weight change.  HENT: Negative for congestion, mouth sores and sinus pressure.   Eyes: Positive for pain. Negative for redness and visual disturbance.  Respiratory: Negative for cough and chest tightness.   Gastrointestinal: Negative for abdominal pain and nausea.  Genitourinary: Negative for difficulty urinating, frequency and vaginal  pain.  Musculoskeletal: Negative for back pain and gait problem.  Skin: Negative for pallor and rash.  Neurological: Negative for dizziness, tremors, weakness, numbness and headaches.  Psychiatric/Behavioral: Negative for confusion and sleep disturbance.    Objective:  BP 120/70   Pulse 75   Ht 5\' 7"  (1.702 m)   Wt 139 lb (63 kg)   SpO2 98%   BMI 21.77 kg/m   BP Readings from Last 3 Encounters:  06/06/16 120/70  08/23/15 140/70  06/01/15 121/61    Wt Readings from Last 3 Encounters:  06/06/16 139 lb (63 kg)  08/23/15 138 lb (62.6 kg)  06/01/15 139 lb 1.6 oz (63.1 kg)    Physical Exam  Constitutional: She appears well-developed. No distress.  HENT:  Head: Normocephalic.  Right Ear: External ear normal.  Left Ear: External ear normal.  Nose: Nose normal.  Mouth/Throat: Oropharynx is clear and moist.  Eyes: Conjunctivae are normal. Pupils are equal, round, and reactive to light. Right eye exhibits no discharge. Left eye exhibits no discharge.  Neck: Normal range of motion. Neck supple. No JVD present. No tracheal deviation present. No thyromegaly present.  Cardiovascular: Normal rate, regular rhythm and normal heart sounds.   Pulmonary/Chest: No stridor. No respiratory distress. She has no wheezes.  Abdominal: Soft. Bowel sounds are normal. She exhibits no distension and no mass. There is no tenderness. There is no rebound and no guarding.  Musculoskeletal: She exhibits no edema or tenderness.  Lymphadenopathy:    She has no cervical adenopathy.  Neurological: She displays normal reflexes. No cranial nerve deficit. She exhibits normal muscle tone. Coordination normal.  Skin: No rash noted. No erythema.  Psychiatric: She has a normal mood and affect. Her behavior is normal. Judgment and thought content normal.    Lab Results  Component Value Date   WBC 3.6 (L) 05/16/2015   HGB 13.6 05/16/2015   HCT 41.1 05/16/2015   PLT 253.0 05/16/2015   GLUCOSE 90 05/16/2015    CHOL 238 (H) 05/16/2015   TRIG 36.0 05/16/2015   HDL 93.10 05/16/2015   LDLDIRECT 127.9 03/09/2013   LDLCALC 138 (H) 05/16/2015   ALT 18 05/16/2015   AST 24 05/16/2015   NA 141 05/16/2015   K 4.0 05/16/2015   CL 106 05/16/2015   CREATININE 0.79 05/16/2015   BUN 19 05/16/2015   CO2 29 05/16/2015   TSH 1.92 02/26/2011   INR 1.1 (H) 05/16/2015   HGBA1C 5.4 02/26/2011    Mm Screening Breast Tomo Bilateral  Result Date: 02/18/2016 CLINICAL DATA:  Screening. EXAM: 2D DIGITAL SCREENING BILATERAL MAMMOGRAM WITH CAD AND ADJUNCT TOMO COMPARISON:  Previous exam(s). ACR Breast Density Category d: The breast tissue is extremely dense, which lowers the sensitivity of mammography. FINDINGS: There are no findings suspicious for malignancy. Images were processed with CAD. IMPRESSION: No mammographic evidence of malignancy. A result letter of this screening mammogram will be mailed directly to the patient. RECOMMENDATION: Screening mammogram in one year. (Code:SM-B-01Y) BI-RADS CATEGORY  1: Negative. Electronically Signed   By: Wendy Serrano M.D.   On: 02/18/2016 09:03    Assessment & Plan:   There are no diagnoses linked to this encounter. I am having Wendy Serrano maintain her multivitamin, calcium-vitamin D, Garlic, glucosamine-chondroitin, acetaminophen, hydrocortisone valerate cream, valACYclovir, SIMBRINZA, fluorometholone, latanoprost, folic acid, methotrexate, and OCUVITE ADULT 50+.  Meds ordered this encounter  Medications  . Multiple Vitamins-Minerals (OCUVITE ADULT 50+) CAPS    Sig: Take 1 capsule by mouth daily.     Follow-up: No Follow-up on file.  Walker Kehr, MD

## 2016-06-06 NOTE — Patient Instructions (Signed)

## 2016-06-06 NOTE — Assessment & Plan Note (Signed)
  Here for medicare wellness/physical  Diet: heart healthy  Physical activity: not sedentary  Depression/mood screen: negative  Hearing: intact to whispered voice  Visual acuity: grossly normal, performs reg eye exam - uveitis ADLs: capable  Fall risk: low to none  Home safety: good  Cognitive evaluation: intact to orientation, naming, recall and repetition  EOL planning: adv directives, full code/ I agree  I have personally reviewed and have noted  1. The patient's medical, surgical and social history  2. Their use of alcohol, tobacco or illicit drugs  3. Their current medications and supplements  4. The patient's functional ability including ADL's, fall risks, home safety risks and hearing or visual impairment.  5. Diet and physical activities  6. Evidence for depression or mood disorders 7. The roster of all physicians providing medical care to patient - is listed in the Snapshot section of the chart and reviewed today.    Today patient counseled on age appropriate routine health concerns for screening and prevention, each reviewed and up to date or declined. Immunizations reviewed and up to date or declined. Labs ordered and reviewed. Risk factors for depression reviewed and negative. Hearing function and visual acuity are intact. ADLs screened and addressed as needed. Functional ability and level of safety reviewed and appropriate. Education, counseling and referrals performed based on assessed risks today. Patient provided with a copy of personalized plan for preventive services.   colonsocopy 2014  shingles - previously declined 

## 2016-06-10 ENCOUNTER — Encounter: Payer: Self-pay | Admitting: Internal Medicine

## 2016-06-10 ENCOUNTER — Telehealth: Payer: Self-pay | Admitting: Emergency Medicine

## 2016-06-10 NOTE — Telephone Encounter (Signed)
Pt wants to know if she can have her labs and messages on labs sent to her Mychart. She has it set up now. Thanks.

## 2017-01-01 ENCOUNTER — Other Ambulatory Visit: Payer: Self-pay | Admitting: Internal Medicine

## 2017-01-01 DIAGNOSIS — Z1231 Encounter for screening mammogram for malignant neoplasm of breast: Secondary | ICD-10-CM

## 2017-02-13 ENCOUNTER — Ambulatory Visit
Admission: RE | Admit: 2017-02-13 | Discharge: 2017-02-13 | Disposition: A | Discharge status: Home / Self Care | Payer: Medicare Other | Source: Ambulatory Visit | Attending: Internal Medicine | Admitting: Internal Medicine

## 2017-02-13 DIAGNOSIS — Z1231 Encounter for screening mammogram for malignant neoplasm of breast: Secondary | ICD-10-CM

## 2017-03-03 ENCOUNTER — Ambulatory Visit (INDEPENDENT_AMBULATORY_CARE_PROVIDER_SITE_OTHER): Payer: Medicare Other

## 2017-03-03 DIAGNOSIS — Z23 Encounter for immunization: Secondary | ICD-10-CM

## 2017-04-21 ENCOUNTER — Encounter (INDEPENDENT_AMBULATORY_CARE_PROVIDER_SITE_OTHER): Payer: Self-pay | Admitting: Vascular Surgery

## 2017-04-25 ENCOUNTER — Ambulatory Visit: Payer: Medicare Other | Admitting: Internal Medicine

## 2017-04-25 ENCOUNTER — Encounter: Payer: Self-pay | Admitting: Internal Medicine

## 2017-04-25 VITALS — BP 124/74 | HR 68 | Temp 98.0°F | Ht 67.0 in | Wt 140.0 lb

## 2017-04-25 DIAGNOSIS — L723 Sebaceous cyst: Secondary | ICD-10-CM

## 2017-04-25 DIAGNOSIS — R221 Localized swelling, mass and lump, neck: Secondary | ICD-10-CM | POA: Insufficient documentation

## 2017-04-25 DIAGNOSIS — L089 Local infection of the skin and subcutaneous tissue, unspecified: Secondary | ICD-10-CM | POA: Insufficient documentation

## 2017-04-25 MED ORDER — DOXYCYCLINE HYCLATE 100 MG PO TABS: 100.0000 mg | ORAL_TABLET | Freq: Two times a day (BID) | ORAL | 0 refills | Status: DC

## 2017-04-25 NOTE — Progress Notes (Signed)
Subjective:    Patient ID: Wendy Serrano, female    DOB: [birth year], 71 y.o.   MRN: [id number]  HPI  Here with c/o lump to right neck that has been present for several years, but only in last 2-3 days has become red, tender, swelling somewhat larger but no drainage. No prior hx of same, denies fever, chills, ST, HA, cough and Pt denies chest pain, increased sob or doe, wheezing, orthopnea, PND, increased LE swelling, palpitations, dizziness or syncope.   Past Medical History:  Diagnosis Date  . Allergic rhinitis   . 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 Eczema    childhood  . H/O osteopenia 01/09/04  . H/O osteoporosis 2008  . H/O varicella   . 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 History of measles, mumps, or rubella   . Leukopenia    chronic  . Liver hemangioma   . 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 Menopausal symptoms   . Renal cyst    bilateral  . Rotator cuff tear   . Yeast infection    Past Surgical History:  Procedure Laterality Date  . ROTATOR CUFF REPAIR Left 08/2009   Left shoulder-rotator cuff repair [facility])  . WISDOM TOOTH EXTRACTION      reports that  has never smoked. she has never used smokeless tobacco. She reports that she does not drink alcohol or use drugs. family history includes Diabetes in her mother; Hyperlipidemia in her sister; Pancreatic cancer in her mother; Prostate cancer in her father. No Known Allergies Current Outpatient Medications on File Prior to Visit  Medication Sig Dispense Refill  . acetaminophen (TYLENOL) 650 MG CR tablet Take 650 mg by mouth every 8 (eight) hours as needed for pain.    . calcium-vitamin D (OSCAL WITH D) 500-200 MG-UNIT per tablet Take 1 tablet by mouth 2 (two) times daily.      . fluorometholone (FML) 0.1 % ophthalmic suspension Place 1 drop into both eyes 4 (four) times daily.  3  . folic acid (FOLVITE) 1 MG tablet Take 1 mg by mouth daily.    . Garlic TABS Take 2  tablets by mouth 2 (two) times daily. Kyolic    . glucosamine-chondroitin 500-400 MG tablet Take 1 tablet by mouth 2 (two) times daily.    . hydrocortisone valerate cream (WESTCORT) 0.2 % Apply topically 2 (two) times daily. 45 g 2  . latanoprost (XALATAN) 0.005 % ophthalmic solution Place 1 drop into both eyes at bedtime.  5  . methotrexate (RHEUMATREX) 2.5 MG tablet TAKE 5 TABLETS BY MOUTH ONCE WEEKLY AS DIRECTED  3  . Multiple Vitamin (MULTIVITAMIN) tablet Take 1 tablet by mouth daily.      . Multiple Vitamins-Minerals (OCUVITE ADULT 50+) CAPS Take 1 capsule by mouth daily.    [name] SIMBRINZA 1-0.2 % SUSP Place 1 drop into both eyes 3 (three) times daily.  6  . valACYclovir (VALTREX) 1000 MG tablet Take 1 tablet (1,000 mg total) by mouth 2 (two) times daily. 20 tablet 3   No current facility-administered medications on file prior to visit.    Review of Systems All otherwise neg per pt     Objective:   Physical Exam BP 124/74 (BP Location: Left Arm, Patient Position: Sitting, Cuff Size: Normal)   Pulse 68   Temp 98 F (36.7 C) (Oral)   Ht 5\' 7"  (1.702 m)   Wt 140 lb (63.5 kg)  SpO2 98%   BMI 21.93 kg/m  VS noted, not ill appearing Constitutional: Pt appears in NAD HENT: Head: NCAT.  Right Ear: External ear normal.  Left Ear: External ear normal.  Eyes: . Pupils are equal, round, and reactive to light. Conjunctivae and EOM are normal Nose: without d/c or deformity Neck: Neck supple. Gross normal ROM; right neck with just over 1 cm raised red, tender, swelling cystic structure without drainage Cardiovascular: Normal rate and regular rhythm.   Pulmonary/Chest: Effort normal and breath sounds without rales or wheezing.  Neurological: Pt is alert. At baseline orientation, motor grossly intact Skin: Skin is warm. No rashes, other new lesions, no LE edema Psychiatric: Pt behavior is normal without agitation  No other exam findings     Assessment & Plan:

## 2017-04-25 NOTE — Patient Instructions (Addendum)
Please take all new medication as prescribed - the antibiotic  You will be contacted regarding the referral for: ENT (soon)  Please continue all other medications as before, and refills have been done if requested.  Please have the pharmacy call with any other refills you may need.  Please keep your appointments with your specialists as you may have planned

## 2017-04-26 NOTE — Assessment & Plan Note (Signed)
Mild to mod, for antibx course, for surgical referral for definitive management, to f/u any worsening symptoms or concerns

## 2017-04-26 NOTE — Assessment & Plan Note (Signed)
As above.

## 2017-05-01 ENCOUNTER — Ambulatory Visit: Payer: Self-pay

## 2017-05-01 NOTE — Telephone Encounter (Addendum)
Patient called in asking what she would need to do about the cyst on the right side of her neck having a white area on it. Patient informed she was seen last week for the same problem and is scheduled for surgery on next Wednesday, but she called because it has swollen more since her visit and the white are looks like a pimple. She denies fever or drainage from the cyst, reports soreness and pain when she touches it.  No available appointments for today, patient advised she could go to the Saturday clinic tomorrow or go to the [place] office today with available appointments, patient states she will think about it and if she needs to, she will go tomorrow to be seen at the Saturday clinic.  Answer Assessment - Initial Assessment Questions 1. APPEARANCE of SWELLING: "What does it look like?" (e.g., lymph node, insect bite, mole)     Swollen, brownish, white area looks like a pimple 2. SIZE: "How large is the swelling?" (inches, cm or compare to coins)     Small like a penny 3. LOCATION: "Where is the swelling located?"     Rt side of neck 4. ONSET: "When did the swelling start?"     Been there last year, but got worse and saw Dr. Wynetta Emery last week because it got larger 5. PAIN: "Is it painful?" If so, ask: "How much?"     Yes-soreness-4 or 5 on pain scale when I touch it 6. ITCH: "Does it itch?" If so, ask: "How much?"     Denies 7. CAUSE: "What do you think caused the swelling?"     Think it's because of the cyst 8. OTHER SYMPTOMS: "Do you have any other symptoms?" (e.g., fever)     Denies  Protocols used: SKIN LUMP OR LOCALIZED SWELLING-A-AH

## 2017-05-04 ENCOUNTER — Telehealth: Payer: Self-pay | Admitting: Internal Medicine

## 2017-05-04 NOTE — Telephone Encounter (Signed)
Pt calling asking if it is normal to experience drainage (pus and blood) at the site of the cyst and if could be covered. Explained to pt that is was not uncommon to experience drainage due to the cyst opening up on it's own. Advised pt to cover area that was draining to prevent it from going down neck and getting on clothes. Also advised pt to contact office where the pt was going to have surgery, which she states she did.

## 2017-05-06 ENCOUNTER — Ambulatory Visit: Payer: Medicare Other | Admitting: Internal Medicine

## 2017-05-31 ENCOUNTER — Ambulatory Visit (HOSPITAL_COMMUNITY)
Admission: EM | Admit: 2017-05-31 | Discharge: 2017-05-31 | Disposition: A | Discharge status: Home / Self Care | Payer: Medicare Other | Attending: Internal Medicine | Admitting: Internal Medicine

## 2017-05-31 ENCOUNTER — Encounter (HOSPITAL_COMMUNITY): Payer: Self-pay | Admitting: Emergency Medicine

## 2017-05-31 ENCOUNTER — Encounter (HOSPITAL_COMMUNITY): Payer: Self-pay | Admitting: Physician Assistant

## 2017-05-31 ENCOUNTER — Telehealth (HOSPITAL_COMMUNITY): Payer: Self-pay | Admitting: Physician Assistant

## 2017-05-31 ENCOUNTER — Other Ambulatory Visit: Payer: Self-pay

## 2017-05-31 DIAGNOSIS — R1030 Lower abdominal pain, unspecified: Secondary | ICD-10-CM | POA: Diagnosis present

## 2017-05-31 DIAGNOSIS — Z79899 Other long term (current) drug therapy: Secondary | ICD-10-CM | POA: Diagnosis not present

## 2017-05-31 DIAGNOSIS — R197 Diarrhea, unspecified: Secondary | ICD-10-CM | POA: Insufficient documentation

## 2017-05-31 LAB — C DIFFICILE QUICK SCREEN W PCR REFLEX
C Diff antigen: POSITIVE — AB
C Diff interpretation: DETECTED
C Diff toxin: POSITIVE — AB

## 2017-05-31 MED ORDER — METRONIDAZOLE 500 MG PO TABS: 500.0000 mg | ORAL_TABLET | Freq: Three times a day (TID) | ORAL | 0 refills | Status: AC

## 2017-05-31 NOTE — Telephone Encounter (Signed)
Stool culture positive for C. difficile.  Flagyl 500 mg 3 times a day for 10 days called into [facility].  Patient to start medication, and contact PCP about  diagnosis.  Patient to follow-up with PCP for further evaluation and treatment needed.  Monitor for any worsening of symptoms, fever, weakness, dizziness, abdominal pain, blood in stool, go to the emergency department for further evaluation.

## 2017-05-31 NOTE — ED Triage Notes (Signed)
Pt states "I feel like I ate a chicken salad sandwich, I ate half of it, put it in the fridge, and when I ate the second part of it the next day I had diarrhea." Pt states she ate the second part of her sandwich 5 days ago. Pt states her BM are runny.

## 2017-05-31 NOTE — ED Provider Notes (Signed)
[place]    CSN: [id number] Arrival date & time: 05/31/17  1219     History   Chief Complaint Chief Complaint  Patient presents with  . Diarrhea    HPI Wendy Serrano is a 72 y.o. female.   72 year old female comes in for 5-day history of diarrhea.  Patient states she had leftover chicken salad sandwich, which started the symptoms.  She states her husband had the same sandwich, with some symptoms, but not as severe as hers.  She denies nausea, vomiting.  She has had some low abdominal cramping during bowel movements denies fever, chills, night sweats.. Has been eating bland diet and be able to keep it down.  She has had 3-4 loose stools a day, that is non-bloody, but watery.  She recently finished a course of doxycycline 2-3 weeks ago.  Denies recent travel, denies recent laxative use.      Past Medical History:  Diagnosis Date  . Allergic rhinitis   . 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 Eczema    childhood  . H/O osteopenia 01/09/04  . H/O osteoporosis 2008  . H/O varicella   . 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 History of measles, mumps, or rubella   . Leukopenia    chronic  . Liver hemangioma   . 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 Menopausal symptoms   . Renal cyst    bilateral  . Rotator cuff tear   . Yeast infection     Patient Active Problem List   Diagnosis Date Noted  . Infected sebaceous cyst of skin 04/25/2017  . Neck mass 04/25/2017  . 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 Benign paroxysmal positional vertigo 08/23/2015  . Knee pain, left 08/23/2015  . Elevated serum cholesterol 12/25/2011  . Well adult exam 03/01/2011  . Osteoporosis 02/19/2010  . 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 ALLERGIC RHINITIS 05/12/2007  . HERPES SIMPLEX, UNCOMPLICATED 86/76/7209    Past Surgical History:  Procedure Laterality Date  . ROTATOR CUFF REPAIR Left 08/2009   Left shoulder-rotator cuff  repair [facility])  . WISDOM TOOTH EXTRACTION      OB History    Gravida Para Term Preterm AB Living   0 0 0 0 0 0   SAB TAB Ectopic Multiple Live Births   0 0 0 0         Home Medications    Prior to Admission medications   Medication Sig Start Date End Date Taking? Authorizing Provider  acetaminophen (TYLENOL) 650 MG CR tablet Take 650 mg by mouth every 8 (eight) hours as needed for pain.    [provider]  calcium-vitamin D (OSCAL WITH D) 500-200 MG-UNIT per tablet Take 1 tablet by mouth 2 (two) times daily.      [provider]  doxycycline (VIBRA-TABS) 100 MG tablet Take 1 tablet (100 mg total) by mouth 2 (two) times daily. Patient not taking: Reported on 05/31/2017 04/25/17   Biagio Borg, MD  fluorometholone (FML) 0.1 % ophthalmic suspension Place 1 drop into both eyes 4 (four) times daily. 07/19/15   [provider]  folic acid (FOLVITE) 1 MG tablet Take 1 mg by mouth daily. 07/24/15   [provider]  Garlic TABS Take 2 tablets by mouth 2 (two) times daily. Kyolic    [provider]  glucosamine-chondroitin 500-400 MG tablet Take 1 tablet by mouth 2 (two) times daily.  [provider]  hydrocortisone valerate cream (WESTCORT) 0.2 % Apply topically 2 (two) times daily. 06/06/16   Plotnikov, Evie Lacks, MD  latanoprost (XALATAN) 0.005 % ophthalmic solution Place 1 drop into both eyes at bedtime. 07/19/15   [provider]  methotrexate (RHEUMATREX) 2.5 MG tablet TAKE 5 TABLETS BY MOUTH ONCE WEEKLY AS DIRECTED 08/09/15   [provider]  Multiple Vitamin (MULTIVITAMIN) tablet Take 1 tablet by mouth daily.      [provider]  Multiple Vitamins-Minerals (OCUVITE ADULT 50+) CAPS Take 1 capsule by mouth daily.    [provider]  SIMBRINZA 1-0.2 % SUSP Place 1 drop into both eyes 3 (three) times daily. 07/29/15   [provider]  valACYclovir (VALTREX) 1000 MG tablet Take 1 tablet (1,000 mg  total) by mouth 2 (two) times daily. 05/10/14   Hoyt Koch, MD    Family History Family History  Problem Relation Age of Onset  . Prostate cancer Father        died  . Pancreatic cancer Mother        died  . Diabetes Mother   . Hyperlipidemia Sister     Social History Social History   Tobacco Use  . Smoking status: Never Smoker  . Smokeless tobacco: Never Used  Substance Use Topics  . Alcohol use: No  . Drug use: No     Allergies   Patient has no known allergies.   Review of Systems Review of Systems  Reason unable to perform ROS: See HPI as above.     Physical Exam Triage Vital Signs ED Triage Vitals [05/31/17 1322]  Enc Vitals Group     BP 126/76     Pulse Rate 94     Resp 18     Temp 98.9 F (37.2 C)     Temp src      SpO2 100 %     Weight      Height      Head Circumference      Peak Flow      Pain Score      Pain Loc      Pain Edu?      Excl. in [place]?    No data found.  Updated Vital Signs BP 126/76   Pulse 94   Temp 98.9 F (37.2 C)   Resp 18   SpO2 100%   Physical Exam  Constitutional: She is oriented to person, place, and time. She appears well-developed and well-nourished. No distress.  HENT:  Head: Normocephalic and atraumatic.  Eyes: Conjunctivae are normal. Pupils are equal, round, and reactive to light.  Cardiovascular: Normal rate, regular rhythm and normal heart sounds. Exam reveals no gallop and no friction rub.  No murmur heard. Pulmonary/Chest: Effort normal and breath sounds normal. She has no wheezes. She has no rales.  Abdominal: Soft. Bowel sounds are normal. She exhibits no mass. There is no tenderness. There is no rebound, no guarding and no CVA tenderness.  Musculoskeletal:  Patient was able to walk from the bathroom back into the exam room without abnormal gait, dizziness, weakness.  Neurological: She is alert and oriented to person, place, and time.  Skin: Skin is warm and dry.  Psychiatric: She has a  normal mood and affect. Her behavior is normal. Judgment normal.     UC Treatments / Results  Labs (all labs ordered are listed, but only abnormal results are displayed) Labs Reviewed  GASTROINTESTINAL PANEL BY PCR, STOOL (REPLACES  STOOL CULTURE)  C DIFFICILE QUICK SCREEN W PCR REFLEX    EKG  EKG Interpretation None       Radiology No results found.  Procedures Procedures (including critical care time)  Medications Ordered in UC Medications - No data to display   Initial Impression / Assessment and Plan / UC Course  I have reviewed the triage vital signs and the nursing notes.  Pertinent labs & imaging results that were available during my care of the patient were reviewed by me and considered in my medical decision making (see chart for details).    Given recent antibiotic use with at least 4 watery loose stools a day, patient would like to be tested for C. difficile.  Stool samples gotten.  Discussed with patient no alarming signs on exam.  If stool samples negative, can get OTC Imodium to slow down diarrhea.  Push fluids.  Bland diet, advance as tolerated.  Return precautions given.  Patient expresses understanding and agrees to plan.  Final Clinical Impressions(s) / UC Diagnoses   Final diagnoses:  Diarrhea, unspecified type    ED Discharge Orders    None         Ok Edwards, PA-C 05/31/17 1516

## 2017-05-31 NOTE — Discharge Instructions (Signed)
No alarming signs on exam. Stool culture sent to test for bacteria that may need antibiotic. Keep hydrated, you urine should be clear to pale yellow in color. Bland diet as attached, advance as tolerated. Probiotics after diarrhea resolves. If stool culture is negative, you can take some over the counter immodium to help slow down the diarrhea, but do not stop the diarrhea. Monitor for any worsening of symptoms, nausea or vomiting not controlled by medication, worsening abdominal pain, fever, follow-up for reevaluation.

## 2017-06-01 LAB — GASTROINTESTINAL PANEL BY PCR, STOOL (REPLACES STOOL CULTURE)
ADENOVIRUS F40/41: NOT DETECTED
ASTROVIRUS: NOT DETECTED
CAMPYLOBACTER SPECIES: NOT DETECTED
Cryptosporidium: NOT DETECTED
Cyclospora cayetanensis: NOT DETECTED
ENTEROPATHOGENIC E COLI (EPEC): NOT DETECTED
Entamoeba histolytica: NOT DETECTED
Enteroaggregative E coli (EAEC): NOT DETECTED
Enterotoxigenic E coli (ETEC): NOT DETECTED
Giardia lamblia: NOT DETECTED
Norovirus GI/GII: NOT DETECTED
PLESIMONAS SHIGELLOIDES: NOT DETECTED
Rotavirus A: NOT DETECTED
SAPOVIRUS (I, II, IV, AND V): NOT DETECTED
SHIGA LIKE TOXIN PRODUCING E COLI (STEC): NOT DETECTED
Salmonella species: NOT DETECTED
Shigella/Enteroinvasive E coli (EIEC): NOT DETECTED
VIBRIO SPECIES: NOT DETECTED
Vibrio cholerae: NOT DETECTED
Yersinia enterocolitica: NOT DETECTED

## 2017-06-03 ENCOUNTER — Encounter: Payer: Self-pay | Admitting: Internal Medicine

## 2017-06-03 ENCOUNTER — Inpatient Hospital Stay: Payer: Medicare Other | Admitting: Internal Medicine

## 2017-06-03 ENCOUNTER — Ambulatory Visit: Payer: Medicare Other | Admitting: Internal Medicine

## 2017-06-03 DIAGNOSIS — A09 Infectious gastroenteritis and colitis, unspecified: Secondary | ICD-10-CM | POA: Diagnosis not present

## 2017-06-03 DIAGNOSIS — L723 Sebaceous cyst: Secondary | ICD-10-CM

## 2017-06-03 DIAGNOSIS — R197 Diarrhea, unspecified: Secondary | ICD-10-CM | POA: Insufficient documentation

## 2017-06-03 DIAGNOSIS — L089 Local infection of the skin and subcutaneous tissue, unspecified: Secondary | ICD-10-CM | POA: Diagnosis not present

## 2017-06-03 DIAGNOSIS — A0472 Enterocolitis due to Clostridium difficile, not specified as recurrent: Secondary | ICD-10-CM | POA: Diagnosis not present

## 2017-06-03 MED ORDER — SACCHAROMYCES BOULARDII 250 MG PO CAPS: 250.0000 mg | ORAL_CAPSULE | Freq: Two times a day (BID) | ORAL | 0 refills | Status: DC

## 2017-06-03 MED ORDER — TRIAMCINOLONE ACETONIDE 0.1 % EX CREA: 1.0000 "application " | TOPICAL_CREAM | Freq: Three times a day (TID) | CUTANEOUS | 3 refills | Status: DC | PRN

## 2017-06-03 NOTE — Patient Instructions (Addendum)
[employer] soap  Wear Nitrile gloves for chores     Clostridium Difficile Infection Clostridium difficile (C. difficile or C. diff) infection causes inflammation of the large intestine (colon). This condition can result in damage to the lining of your colon and may lead to another condition called colitis. This infection can be passed from person to person (is contagious). Follow these instructions at home: Eating and drinking  Drink enough fluid to keep your pee (urine) clear or pale yellow.  Avoid drinking: ? Milk. ? Caffeine. ? Alcohol.  Follow exact instructions from your doctor about how to get enough fluid in your body (rehydrate).  Eat small meals often instead of large meals. Medicines  Take your antibiotic medicine as told by your doctor. Do not stop taking the antibiotic even if you start to feel better unless your doctor told you to do that.  Take over-the-counter and prescription medicines only as told by your doctor.  Do not use medicines to help with watery poop (diarrhea). General instructions  Wash your hands fully before you prepare food and after you use the bathroom. Make sure people who live with you also wash their  hands often.  Clean the surfaces that you touch. Use a product that contains chlorine bleach.  Keep all follow-up visits as told by your doctor. This is important. Contact a doctor if:  Your symptoms do not get better with treatment.  Your symptoms get worse with treatment.  Your symptoms go away and then come back.  You have a fever.  You have new symptoms. Get help right away if:  You have more pain or tenderness in your belly (abdomen).  Your poop (stool) is mostly bloody.  Your poop looks dark black and tarry.  You cannot eat or drink without throwing up (vomiting).  You have signs of dehydration, such as: ? Dark pee, very little pee, or no pee. ? Cracked lips. ? Not making tears when you cry. ? Dry mouth. ? Sunken  eyes. ? Feeling sleepy. ? Feeling weak. ? Feeling dizzy. This information is not intended to replace advice given to you by your health care provider. Make sure you discuss any questions you have with your health care provider. Document Released: 02/23/2009 Document Revised: 10/04/2015 Document Reviewed: 10/30/2014 Elsevier Interactive Patient Education  2017 [employer].

## 2017-06-03 NOTE — Assessment & Plan Note (Signed)
F/u w/ENT 

## 2017-06-03 NOTE — Assessment & Plan Note (Signed)
Finish Flagyl Florastor Imodium prn

## 2017-06-03 NOTE — Progress Notes (Signed)
Subjective:  Patient ID: Wendy Serrano, female    DOB: 04-28-1946  Age: 72 y.o. MRN: [id number]  CC: No chief complaint on file.   HPI Wendy Serrano presents for skin rash on hands F/u diarrhea F/u cyst on R neck   Outpatient Medications Prior to Visit  Medication Sig Dispense Refill  . acetaminophen (TYLENOL) 650 MG CR tablet Take 650 mg by mouth every 8 (eight) hours as needed for pain.    . calcium-vitamin D (OSCAL WITH D) 500-200 MG-UNIT per tablet Take 1 tablet by mouth 2 (two) times daily.      . fluorometholone (FML) 0.1 % ophthalmic suspension Place 1 drop into both eyes 4 (four) times daily.  3  . folic acid (FOLVITE) 1 MG tablet Take 1 mg by mouth daily.    . Garlic TABS Take 2 tablets by mouth 2 (two) times daily. Kyolic    . glucosamine-chondroitin 500-400 MG tablet Take 1 tablet by mouth 2 (two) times daily.    . hydrocortisone valerate cream (WESTCORT) 0.2 % Apply topically 2 (two) times daily. 45 g 2  . latanoprost (XALATAN) 0.005 % ophthalmic solution Place 1 drop into both eyes at bedtime.  5  . methotrexate (RHEUMATREX) 2.5 MG tablet TAKE 5 TABLETS BY MOUTH ONCE WEEKLY AS DIRECTED  3  . metroNIDAZOLE (FLAGYL) 500 MG tablet Take 1 tablet (500 mg total) by mouth 3 (three) times daily for 10 days. 30 tablet 0  . Multiple Vitamin (MULTIVITAMIN) tablet Take 1 tablet by mouth daily.      . Multiple Vitamins-Minerals (OCUVITE ADULT 50+) CAPS Take 1 capsule by mouth daily.    [name] SIMBRINZA 1-0.2 % SUSP Place 1 drop into both eyes 3 (three) times daily.  6  . valACYclovir (VALTREX) 1000 MG tablet Take 1 tablet (1,000 mg total) by mouth 2 (two) times daily. 20 tablet 3  . doxycycline (VIBRA-TABS) 100 MG tablet Take 1 tablet (100 mg total) by mouth 2 (two) times daily. (Patient not taking: Reported on 05/31/2017) 20 tablet 0   No facility-administered medications prior to visit.     ROS Review of Systems  Constitutional: Negative for activity change, appetite change,  chills, fatigue and unexpected weight change.  HENT: Negative for congestion, mouth sores and sinus pressure.   Eyes: Negative for visual disturbance.  Respiratory: Negative for cough and chest tightness.   Gastrointestinal: Positive for diarrhea. Negative for abdominal pain and nausea.  Genitourinary: Negative for difficulty urinating, frequency and vaginal pain.  Musculoskeletal: Negative for back pain and gait problem.  Skin: Positive for rash. Negative for pallor.  Neurological: Negative for dizziness, tremors, weakness, numbness and headaches.  Psychiatric/Behavioral: Negative for confusion and sleep disturbance.    Objective:  BP 126/82 (BP Location: Left Arm, Patient Position: Sitting, Cuff Size: Normal)   Pulse 79   Temp 98.1 F (36.7 C) (Oral)   Ht 5\' 7"  (1.702 m)   Wt 134 lb (60.8 kg)   SpO2 99%   BMI 20.99 kg/m   BP Readings from Last 3 Encounters:  06/03/17 126/82  05/31/17 126/76  04/25/17 124/74    Wt Readings from Last 3 Encounters:  06/03/17 134 lb (60.8 kg)  04/25/17 140 lb (63.5 kg)  06/06/16 139 lb (63 kg)    Physical Exam  Constitutional: She appears well-developed. No distress.  HENT:  Head: Normocephalic.  Right Ear: External ear normal.  Left Ear: External ear normal.  Nose: Nose normal.  Mouth/Throat: Oropharynx is clear and moist.  Eyes: Conjunctivae are normal. Pupils are equal, round, and reactive to light. Right eye exhibits no discharge. Left eye exhibits no discharge.  Neck: Normal range of motion. Neck supple. No JVD present. No tracheal deviation present. No thyromegaly present.  Cardiovascular: Normal rate, regular rhythm and normal heart sounds.  Pulmonary/Chest: No stridor. No respiratory distress. She has no wheezes.  Abdominal: Soft. Bowel sounds are normal. She exhibits no distension and no mass. There is no tenderness. There is no rebound and no guarding.  Musculoskeletal: She exhibits no edema or tenderness.  Lymphadenopathy:     She has no cervical adenopathy.  Neurological: She displays normal reflexes. No cranial nerve deficit. She exhibits normal muscle tone. Coordination normal.  Skin: Rash noted. No erythema.  Psychiatric: She has a normal mood and affect. Her behavior is normal. Judgment and thought content normal.  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  Assessment & Plan:   There are no diagnoses linked to this encounter. I have discontinued Lamis Criscuolo's doxycycline. I am also having her maintain her multivitamin, calcium-vitamin D, Garlic, glucosamine-chondroitin, acetaminophen, valACYclovir, SIMBRINZA, fluorometholone, latanoprost, folic acid, methotrexate, OCUVITE ADULT 50+, hydrocortisone valerate cream, and metroNIDAZOLE.  No orders of the defined types were placed in this encounter.    Follow-up: No Follow-up on file.  Walker Kehr, MD

## 2017-06-03 NOTE — Assessment & Plan Note (Signed)
Flagyl, Florastor

## 2017-06-12 ENCOUNTER — Encounter: Payer: Self-pay | Admitting: Internal Medicine

## 2017-06-12 ENCOUNTER — Ambulatory Visit (INDEPENDENT_AMBULATORY_CARE_PROVIDER_SITE_OTHER): Payer: Medicare Other | Admitting: Internal Medicine

## 2017-06-12 ENCOUNTER — Telehealth: Payer: Self-pay | Admitting: Internal Medicine

## 2017-06-12 ENCOUNTER — Ambulatory Visit (INDEPENDENT_AMBULATORY_CARE_PROVIDER_SITE_OTHER)
Admission: RE | Admit: 2017-06-12 | Discharge: 2017-06-12 | Disposition: A | Discharge status: Home / Self Care | Payer: Medicare Other | Source: Ambulatory Visit | Attending: Internal Medicine | Admitting: Internal Medicine

## 2017-06-12 ENCOUNTER — Other Ambulatory Visit (INDEPENDENT_AMBULATORY_CARE_PROVIDER_SITE_OTHER): Payer: Medicare Other

## 2017-06-12 ENCOUNTER — Ambulatory Visit (HOSPITAL_COMMUNITY)
Admission: RE | Admit: 2017-06-12 | Discharge: 2017-06-12 | Disposition: A | Discharge status: Home / Self Care | Payer: Medicare Other | Source: Ambulatory Visit | Attending: Vascular Surgery | Admitting: Vascular Surgery

## 2017-06-12 VITALS — BP 130/70 | HR 76 | Temp 98.1°F | Ht 67.0 in | Wt 136.0 lb

## 2017-06-12 DIAGNOSIS — R600 Localized edema: Secondary | ICD-10-CM

## 2017-06-12 DIAGNOSIS — M810 Age-related osteoporosis without current pathological fracture: Secondary | ICD-10-CM

## 2017-06-12 DIAGNOSIS — R609 Edema, unspecified: Secondary | ICD-10-CM | POA: Insufficient documentation

## 2017-06-12 DIAGNOSIS — H209 Unspecified iridocyclitis: Secondary | ICD-10-CM

## 2017-06-12 DIAGNOSIS — Z0001 Encounter for general adult medical examination with abnormal findings: Secondary | ICD-10-CM

## 2017-06-12 DIAGNOSIS — A09 Infectious gastroenteritis and colitis, unspecified: Secondary | ICD-10-CM | POA: Diagnosis not present

## 2017-06-12 DIAGNOSIS — Z0000 Encounter for general adult medical examination without abnormal findings: Secondary | ICD-10-CM

## 2017-06-12 DIAGNOSIS — A0472 Enterocolitis due to Clostridium difficile, not specified as recurrent: Secondary | ICD-10-CM

## 2017-06-12 DIAGNOSIS — S92535A Nondisplaced fracture of distal phalanx of left lesser toe(s), initial encounter for closed fracture: Secondary | ICD-10-CM

## 2017-06-12 HISTORY — DX: Enterocolitis due to Clostridium difficile, not specified as recurrent: A04.72

## 2017-06-12 LAB — URINALYSIS
BILIRUBIN URINE: NEGATIVE
HGB URINE DIPSTICK: NEGATIVE
KETONES UR: NEGATIVE
Leukocytes, UA: NEGATIVE
Nitrite: NEGATIVE
Specific Gravity, Urine: 1.015 (ref 1.000–1.030)
Total Protein, Urine: NEGATIVE
URINE GLUCOSE: NEGATIVE
UROBILINOGEN UA: 0.2 (ref 0.0–1.0)
pH: 6 (ref 5.0–8.0)

## 2017-06-12 LAB — LIPID PANEL
CHOL/HDL RATIO: 2
Cholesterol: 178 mg/dL (ref 0–200)
HDL: 74.6 mg/dL (ref >39.00)
LDL CALC: 87 mg/dL (ref 0–99)
NonHDL: 103.08
Triglycerides: 80 mg/dL (ref 0.0–149.0)
VLDL: 16 mg/dL (ref 0.0–40.0)

## 2017-06-12 LAB — CBC WITH DIFFERENTIAL/PLATELET
BASOS ABS: 0.1 10*3/uL (ref 0.0–0.1)
Basophils Relative: 1.4 % (ref 0.0–3.0)
EOS ABS: 0.1 10*3/uL (ref 0.0–0.7)
Eosinophils Relative: 1.9 % (ref 0.0–5.0)
HEMATOCRIT: 38.5 % (ref 36.0–46.0)
Hemoglobin: 12.8 g/dL (ref 12.0–15.0)
LYMPHS ABS: 0.8 10*3/uL (ref 0.7–4.0)
LYMPHS PCT: 19.4 % (ref 12.0–46.0)
MCHC: 33.3 g/dL (ref 30.0–36.0)
MCV: 94.1 fl (ref 78.0–100.0)
MONO ABS: 0.4 10*3/uL (ref 0.1–1.0)
Monocytes Relative: 9.5 % (ref 3.0–12.0)
NEUTROS ABS: 2.8 10*3/uL (ref 1.4–7.7)
NEUTROS PCT: 67.8 % (ref 43.0–77.0)
PLATELETS: 431 10*3/uL — AB (ref 150.0–400.0)
RBC: 4.09 Mil/uL (ref 3.87–5.11)
RDW: 15.6 % — ABNORMAL HIGH (ref 11.5–15.5)
WBC: 4.1 10*3/uL (ref 4.0–10.5)

## 2017-06-12 LAB — BASIC METABOLIC PANEL
BUN: 19 mg/dL (ref 6–23)
CHLORIDE: 106 meq/L (ref 96–112)
CO2: 28 meq/L (ref 19–32)
CREATININE: 0.94 mg/dL (ref 0.40–1.20)
Calcium: 9.3 mg/dL (ref 8.4–10.5)
GFR: 75.11 mL/min (ref >60.00)
Glucose, Bld: 81 mg/dL (ref 70–99)
Potassium: 3.9 mEq/L (ref 3.5–5.1)
Sodium: 141 mEq/L (ref 135–145)

## 2017-06-12 LAB — HEPATIC FUNCTION PANEL
ALBUMIN: 4 g/dL (ref 3.5–5.2)
ALT: 24 U/L (ref 0–35)
AST: 25 U/L (ref 0–37)
Alkaline Phosphatase: 51 U/L (ref 39–117)
Bilirubin, Direct: 0 mg/dL (ref 0.0–0.3)
Total Bilirubin: 0.6 mg/dL (ref 0.2–1.2)
Total Protein: 7 g/dL (ref 6.0–8.3)

## 2017-06-12 LAB — TSH: TSH: 3.43 u[IU]/mL (ref 0.35–4.50)

## 2017-06-12 MED ORDER — HYDROCORTISONE VALERATE 0.2 % EX CREA: TOPICAL_CREAM | Freq: Two times a day (BID) | CUTANEOUS | 2 refills | Status: DC

## 2017-06-12 NOTE — Progress Notes (Signed)
Subjective:  Patient ID: Wendy Serrano, female    DOB: 05/14/1945  Age: 72 y.o. MRN: [id number]  CC: No chief complaint on file.   HPI Glenora Morocho presents for a well exam Diarrhea has stopped, finishing Flagyl  C/o L ankle swelling, discoloration  Outpatient Medications Prior to Visit  Medication Sig Dispense Refill  . acetaminophen (TYLENOL) 650 MG CR tablet Take 650 mg by mouth every 8 (eight) hours as needed for pain.    . calcium-vitamin D (OSCAL WITH D) 500-200 MG-UNIT per tablet Take 1 tablet by mouth 2 (two) times daily.      . fluorometholone (FML) 0.1 % ophthalmic suspension Place 1 drop into both eyes 4 (four) times daily.  3  . folic acid (FOLVITE) 1 MG tablet Take 1 mg by mouth daily.    . Garlic TABS Take 2 tablets by mouth 2 (two) times daily. Kyolic    . glucosamine-chondroitin 500-400 MG tablet Take 1 tablet by mouth 2 (two) times daily.    . hydrocortisone valerate cream (WESTCORT) 0.2 % Apply topically 2 (two) times daily. 45 g 2  . latanoprost (XALATAN) 0.005 % ophthalmic solution Place 1 drop into both eyes at bedtime.  5  . methotrexate (RHEUMATREX) 2.5 MG tablet TAKE 5 TABLETS BY MOUTH ONCE WEEKLY AS DIRECTED  3  . Multiple Vitamin (MULTIVITAMIN) tablet Take 1 tablet by mouth daily.      . Multiple Vitamins-Minerals (OCUVITE ADULT 50+) CAPS Take 1 capsule by mouth daily.    [name] saccharomyces boulardii (FLORASTOR) 250 MG capsule Take 1 capsule (250 mg total) by mouth 2 (two) times daily. 60 capsule 0  . SIMBRINZA 1-0.2 % SUSP Place 1 drop into both eyes 3 (three) times daily.  6  . triamcinolone cream (KENALOG) 0.1 % Apply 1 application topically 3 (three) times daily as needed. 450 g 3  . valACYclovir (VALTREX) 1000 MG tablet Take 1 tablet (1,000 mg total) by mouth 2 (two) times daily. 20 tablet 3   No facility-administered medications prior to visit.     ROS Review of Systems  Constitutional: Negative for activity change, appetite change, chills,  fatigue and unexpected weight change.  HENT: Negative for congestion, mouth sores and sinus pressure.   Eyes: Negative for visual disturbance.  Respiratory: Negative for cough and chest tightness.   Cardiovascular: Positive for leg swelling.  Gastrointestinal: Negative for abdominal pain and nausea.  Genitourinary: Negative for difficulty urinating, frequency and vaginal pain.  Musculoskeletal: Negative for back pain and gait problem.  Skin: Positive for color change. Negative for pallor and rash.  Neurological: Negative for dizziness, tremors, weakness, numbness and headaches.  Psychiatric/Behavioral: Negative for confusion and sleep disturbance.    Objective:  BP 130/70 (BP Location: Left Arm, Patient Position: Sitting, Cuff Size: Normal)   Pulse 76   Temp 98.1 F (36.7 C) (Oral)   Ht 5\' 7"  (1.702 m)   Wt 136 lb (61.7 kg)   SpO2 100%   BMI 21.30 kg/m   BP Readings from Last 3 Encounters:  06/12/17 130/70  06/03/17 126/82  05/31/17 126/76    Wt Readings from Last 3 Encounters:  06/12/17 136 lb (61.7 kg)  06/03/17 134 lb (60.8 kg)  04/25/17 140 lb (63.5 kg)    Physical Exam  Constitutional: She appears well-developed. No distress.  HENT:  Head: Normocephalic.  Right Ear: External ear normal.  Left Ear: External ear normal.  Nose: Nose normal.  Mouth/Throat: Oropharynx is clear and moist.  Eyes: Conjunctivae  are normal. Pupils are equal, round, and reactive to light. Right eye exhibits no discharge. Left eye exhibits no discharge.  Neck: Normal range of motion. Neck supple. No JVD present. No tracheal deviation present. No thyromegaly present.  Cardiovascular: Normal rate, regular rhythm and normal heart sounds.  Pulmonary/Chest: No stridor. No respiratory distress. She has no wheezes.  Abdominal: Soft. Bowel sounds are normal. She exhibits no distension and no mass. There is no tenderness. There is no rebound and no guarding.  Musculoskeletal: She exhibits no edema or  tenderness.  Lymphadenopathy:    She has no cervical adenopathy.  Neurological: She displays normal reflexes. No cranial nerve deficit. She exhibits normal muscle tone. Coordination normal.  Skin: No rash noted. No erythema.  Psychiatric: She has a normal mood and affect. Her behavior is normal. Judgment and thought content normal.  Left lateral foot is slightly swollen and discolored.  Ankle with full range of motion.  Trace edema at both ankles.  Lab Results  Component Value Date   WBC 4.4 06/06/2016   HGB 12.8 06/06/2016   HCT 38.3 06/06/2016   PLT 237.0 06/06/2016   GLUCOSE 84 06/06/2016   CHOL 225 (H) 06/06/2016   TRIG 37.0 06/06/2016   HDL 91.80 06/06/2016   LDLDIRECT 127.9 03/09/2013   LDLCALC 125 (H) 06/06/2016   ALT 16 06/06/2016   AST 22 06/06/2016   NA 143 06/06/2016   K 3.7 06/06/2016   CL 107 06/06/2016   CREATININE 0.83 06/06/2016   BUN 17 06/06/2016   CO2 30 06/06/2016   TSH 2.17 06/06/2016   INR 1.1 (H) 05/16/2015   HGBA1C 5.4 02/26/2011    No results found.  Assessment & Plan:   Diagnoses and all orders for this visit:  Osteoporosis, unspecified osteoporosis type, unspecified pathological fracture presence -     DG Bone Density; Future   I am having Sullivan Lone maintain her multivitamin, calcium-vitamin D, Garlic, glucosamine-chondroitin, acetaminophen, valACYclovir, SIMBRINZA, fluorometholone, latanoprost, folic acid, methotrexate, OCUVITE ADULT 50+, hydrocortisone valerate cream, triamcinolone cream, and saccharomyces boulardii.  No orders of the defined types were placed in this encounter.    Follow-up: No Follow-up on file.  Walker Kehr, MD

## 2017-06-12 NOTE — Telephone Encounter (Signed)
'  Wendy Serrano' at [facility] reports negative for DVTs.

## 2017-06-12 NOTE — Assessment & Plan Note (Addendum)
ellastic ankle brace [place] veins Ankle X ray venous Doppler ultrasound is normal bilaterally The edema was likely related to the left little toe fracture

## 2017-06-12 NOTE — Assessment & Plan Note (Signed)
  Here for medicare wellness/physical  Diet: heart healthy  Physical activity: not sedentary  Depression/mood screen: negative  Hearing: intact to whispered voice  Visual acuity: grossly normal, performs reg eye exam - uveitis ADLs: capable  Fall risk: low to none  Home safety: good  Cognitive evaluation: intact to orientation, naming, recall and repetition  EOL planning: adv directives, full code/ I agree  I have personally reviewed and have noted  1. The patient's medical, surgical and social history  2. Their use of alcohol, tobacco or illicit drugs  3. Their current medications and supplements  4. The patient's functional ability including ADL's, fall risks, home safety risks and hearing or visual impairment.  5. Diet and physical activities  6. Evidence for depression or mood disorders 7. The roster of all physicians providing medical care to patient - is listed in the Snapshot section of the chart and reviewed today.    Today patient counseled on age appropriate routine health concerns for screening and prevention, each reviewed and up to date or declined. Immunizations reviewed and up to date or declined. Labs ordered and reviewed. Risk factors for depression reviewed and negative. Hearing function and visual acuity are intact. ADLs screened and addressed as needed. Functional ability and level of safety reviewed and appropriate. Education, counseling and referrals performed based on assessed risks today. Patient provided with a copy of personalized plan for preventive services.   colonsocopy 2014  shingles - previously declined

## 2017-06-12 NOTE — Assessment & Plan Note (Signed)
F/u w/Dr Manuella Ghazi On MTX and folic acid

## 2017-06-12 NOTE — Patient Instructions (Addendum)
Denosumab injection What is this medicine? DENOSUMAB (den oh sue mab) slows bone breakdown. Prolia is used to treat osteoporosis in women after menopause and in men. Delton See is used to treat a high calcium level due to cancer and to prevent bone fractures and other bone problems caused by multiple myeloma or cancer bone metastases. Delton See is also used to treat giant cell tumor of the bone. This medicine may be used for other purposes; ask your health care provider or pharmacist if you have questions. COMMON BRAND NAME(S): Prolia, XGEVA What should I tell my health care provider before I take this medicine? They need to know if you have any of these conditions: -dental disease -having surgery or tooth extraction -infection -kidney disease -low levels of calcium or Vitamin D in the blood -malnutrition -on hemodialysis -skin conditions or sensitivity -thyroid or parathyroid disease -an unusual reaction to denosumab, other medicines, foods, dyes, or preservatives -pregnant or trying to get pregnant -breast-feeding How should I use this medicine? This medicine is for injection under the skin. It is given by a health care professional in a hospital or clinic setting. If you are getting Prolia, a special MedGuide will be given to you by the pharmacist with each prescription and refill. Be sure to read this information carefully each time. For Prolia, talk to your pediatrician regarding the use of this medicine in children. Special care may be needed. For Delton See, talk to your pediatrician regarding the use of this medicine in children. While this drug may be prescribed for children as young as 13 years for selected conditions, precautions do apply. Overdosage: If you think you have taken too much of this medicine contact a poison control center or emergency room at once. NOTE: This medicine is only for you. Do not share this medicine with others. What if I miss a dose? It is important not to miss your  dose. Call your doctor or health care professional if you are unable to keep an appointment. What may interact with this medicine? Do not take this medicine with any of the following medications: -other medicines containing denosumab This medicine may also interact with the following medications: -medicines that lower your chance of fighting infection -steroid medicines like prednisone or cortisone This list may not describe all possible interactions. Give your health care provider a list of all the medicines, herbs, non-prescription drugs, or dietary supplements you use. Also tell them if you smoke, drink alcohol, or use illegal drugs. Some items may interact with your medicine. What should I watch for while using this medicine? Visit your doctor or health care professional for regular checks on your progress. Your doctor or health care professional may order blood tests and other tests to see how you are doing. Call your doctor or health care professional for advice if you get a fever, chills or sore throat, or other symptoms of a cold or flu. Do not treat yourself. This drug may decrease your body's ability to fight infection. Try to avoid being around people who are sick. You should make sure you get enough calcium and vitamin D while you are taking this medicine, unless your doctor tells you not to. Discuss the foods you eat and the vitamins you take with your health care professional. See your dentist regularly. Brush and floss your teeth as directed. Before you have any dental work done, tell your dentist you are receiving this medicine. Do not become pregnant while taking this medicine or for 5 months after stopping  it. Talk with your doctor or health care professional about your birth control options while taking this medicine. Women should inform their doctor if they wish to become pregnant or think they might be pregnant. There is a potential for serious side effects to an unborn child. Talk  to your health care professional or pharmacist for more information. What side effects may I notice from receiving this medicine? Side effects that you should report to your doctor or health care professional as soon as possible: -allergic reactions like skin rash, itching or hives, swelling of the face, lips, or tongue -bone pain -breathing problems -dizziness -jaw pain, especially after dental work -redness, blistering, peeling of the skin -signs and symptoms of infection like fever or chills; cough; sore throat; pain or trouble passing urine -signs of low calcium like fast heartbeat, muscle cramps or muscle pain; pain, tingling, numbness in the hands or feet; seizures -unusual bleeding or bruising -unusually weak or tired Side effects that usually do not require medical attention (report to your doctor or health care professional if they continue or are bothersome): -constipation -diarrhea -headache -joint pain -loss of appetite -muscle pain -runny nose -tiredness -upset stomach This list may not describe all possible side effects. Call your doctor for medical advice about side effects. You may report side effects to FDA at 1-800-FDA-1088. Where should I keep my medicine? This medicine is only given in a clinic, doctor's office, or other health care setting and will not be stored at home. NOTE: This sheet is a summary. It may not cover all possible information. If you have questions about this medicine, talk to your doctor, pharmacist, or health care provider.  2018 Elsevier/Gold Standard (2016-05-20 19:17:21)   Support knee highs socks, elevate legs Ankle brace, elastic Health Maintenance for Postmenopausal Women Menopause is a normal process in which your reproductive ability comes to an end. This process happens gradually over a span of months to years, usually between the ages of 60 and 63. Menopause is complete when you have missed 12 consecutive menstrual periods. It is  important to talk with your health care provider about some of the most common conditions that affect postmenopausal women, such as heart disease, cancer, and bone loss (osteoporosis). Adopting a healthy lifestyle and getting preventive care can help to promote your health and wellness. Those actions can also lower your chances of developing some of these common conditions. What should I know about menopause? During menopause, you may experience a number of symptoms, such as:  Moderate-to-severe hot flashes.  Night sweats.  Decrease in sex drive.  Mood swings.  Headaches.  Tiredness.  Irritability.  Memory problems.  Insomnia.  Choosing to treat or not to treat menopausal changes is an individual decision that you make with your health care provider. What should I know about hormone replacement therapy and supplements? Hormone therapy products are effective for treating symptoms that are associated with menopause, such as hot flashes and night sweats. Hormone replacement carries certain risks, especially as you become older. If you are thinking about using estrogen or estrogen with progestin treatments, discuss the benefits and risks with your health care provider. What should I know about heart disease and stroke? Heart disease, heart attack, and stroke become more likely as you age. This may be due, in part, to the hormonal changes that your body experiences during menopause. These can affect how your body processes dietary fats, triglycerides, and cholesterol. Heart attack and stroke are both medical emergencies. There are many  things that you can do to help prevent heart disease and stroke:  Have your blood pressure checked at least every 1-2 years. High blood pressure causes heart disease and increases the risk of stroke.  If you are 69-37 years old, ask your health care provider if you should take aspirin to prevent a heart attack or a stroke.  Do not use any tobacco products,  including cigarettes, chewing tobacco, or electronic cigarettes. If you need help quitting, ask your health care provider.  It is important to eat a healthy diet and maintain a healthy weight. ? Be sure to include plenty of vegetables, fruits, low-fat dairy products, and lean protein. ? Avoid eating foods that are high in solid fats, added sugars, or salt (sodium).  Get regular exercise. This is one of the most important things that you can do for your health. ? Try to exercise for at least 150 minutes each week. The type of exercise that you do should increase your heart rate and make you sweat. This is known as moderate-intensity exercise. ? Try to do strengthening exercises at least twice each week. Do these in addition to the moderate-intensity exercise.  Know your numbers.Ask your health care provider to check your cholesterol and your blood glucose. Continue to have your blood tested as directed by your health care provider.  What should I know about cancer screening? There are several types of cancer. Take the following steps to reduce your risk and to catch any cancer development as early as possible. Breast Cancer  Practice breast self-awareness. ? This means understanding how your breasts normally appear and feel. ? It also means doing regular breast self-exams. Let your health care provider know about any changes, no matter how small.  If you are 70 or older, have a clinician do a breast exam (clinical breast exam or CBE) every year. Depending on your age, family history, and medical history, it may be recommended that you also have a yearly breast X-ray (mammogram).  If you have a family history of breast cancer, talk with your health care provider about genetic screening.  If you are at high risk for breast cancer, talk with your health care provider about having an MRI and a mammogram every year.  Breast cancer (BRCA) gene test is recommended for women who have family members  with BRCA-related cancers. Results of the assessment will determine the need for genetic counseling and BRCA1 and for BRCA2 testing. BRCA-related cancers include these types: ? Breast. This occurs in males or females. ? Ovarian. ? Tubal. This may also be called fallopian tube cancer. ? Cancer of the abdominal or pelvic lining (peritoneal cancer). ? Prostate. ? Pancreatic.  Cervical, Uterine, and Ovarian Cancer Your health care provider may recommend that you be screened regularly for cancer of the pelvic organs. These include your ovaries, uterus, and vagina. This screening involves a pelvic exam, which includes checking for microscopic changes to the surface of your cervix (Pap test).  For women ages 21-65, health care providers may recommend a pelvic exam and a Pap test every three years. For women ages 49-65, they may recommend the Pap test and pelvic exam, combined with testing for human papilloma virus (HPV), every five years. Some types of HPV increase your risk of cervical cancer. Testing for HPV may also be done on women of any age who have unclear Pap test results.  Other health care providers may not recommend any screening for nonpregnant women who are considered low risk  for pelvic cancer and have no symptoms. Ask your health care provider if a screening pelvic exam is right for you.  If you have had past treatment for cervical cancer or a condition that could lead to cancer, you need Pap tests and screening for cancer for at least 20 years after your treatment. If Pap tests have been discontinued for you, your risk factors (such as having a new sexual partner) need to be reassessed to determine if you should start having screenings again. Some women have medical problems that increase the chance of getting cervical cancer. In these cases, your health care provider may recommend that you have screening and Pap tests more often.  If you have a family history of uterine cancer or ovarian  cancer, talk with your health care provider about genetic screening.  If you have vaginal bleeding after reaching menopause, tell your health care provider.  There are currently no reliable tests available to screen for ovarian cancer.  Lung Cancer Lung cancer screening is recommended for adults 3-21 years old who are at high risk for lung cancer because of a history of smoking. A yearly low-dose CT scan of the lungs is recommended if you:  Currently smoke.  Have a history of at least 30 pack-years of smoking and you currently smoke or have quit within the past 15 years. A pack-year is smoking an average of one pack of cigarettes per day for one year.  Yearly screening should:  Continue until it has been 15 years since you quit.  Stop if you develop a health problem that would prevent you from having lung cancer treatment.  Colorectal Cancer  This type of cancer can be detected and can often be prevented.  Routine colorectal cancer screening usually begins at age 77 and continues through age 100.  If you have risk factors for colon cancer, your health care provider may recommend that you be screened at an earlier age.  If you have a family history of colorectal cancer, talk with your health care provider about genetic screening.  Your health care provider may also recommend using home test kits to check for hidden blood in your stool.  A small camera at the end of a tube can be used to examine your colon directly (sigmoidoscopy or colonoscopy). This is done to check for the earliest forms of colorectal cancer.  Direct examination of the colon should be repeated every 5-10 years until age 75. However, if early forms of precancerous polyps or small growths are found or if you have a family history or genetic risk for colorectal cancer, you may need to be screened more often.  Skin Cancer  Check your skin from head to toe regularly.  Monitor any moles. Be sure to tell your health  care provider: ? About any new moles or changes in moles, especially if there is a change in a mole's shape or color. ? If you have a mole that is larger than the size of a pencil eraser.  If any of your family members has a history of skin cancer, especially at a young age, talk with your health care provider about genetic screening.  Always use sunscreen. Apply sunscreen liberally and repeatedly throughout the day.  Whenever you are outside, protect yourself by wearing long sleeves, pants, a wide-brimmed hat, and sunglasses.  What should I know about osteoporosis? Osteoporosis is a condition in which bone destruction happens more quickly than new bone creation. After menopause, you may be at  an increased risk for osteoporosis. To help prevent osteoporosis or the bone fractures that can happen because of osteoporosis, the following is recommended:  If you are 81-83 years old, get at least 1,000 mg of calcium and at least 600 mg of vitamin D per day.  If you are older than age 8 but younger than age 67, get at least 1,200 mg of calcium and at least 600 mg of vitamin D per day.  If you are older than age 74, get at least 1,200 mg of calcium and at least 800 mg of vitamin D per day.  Smoking and excessive alcohol intake increase the risk of osteoporosis. Eat foods that are rich in calcium and vitamin D, and do weight-bearing exercises several times each week as directed by your health care provider. What should I know about how menopause affects my mental health? Depression may occur at any age, but it is more common as you become older. Common symptoms of depression include:  Low or sad mood.  Changes in sleep patterns.  Changes in appetite or eating patterns.  Feeling an overall lack of motivation or enjoyment of activities that you previously enjoyed.  Frequent crying spells.  Talk with your health care provider if you think that you are experiencing depression. What should I know  about immunizations? It is important that you get and maintain your immunizations. These include:  Tetanus, diphtheria, and pertussis (Tdap) booster vaccine.  Influenza every year before the flu season begins.  Pneumonia vaccine.  Shingles vaccine.  Your health care provider may also recommend other immunizations. This information is not intended to replace advice given to you by your health care provider. Make sure you discuss any questions you have with your health care provider. Document Released: 06/20/2005 Document Revised: 11/16/2015 Document Reviewed: 01/30/2015 Elsevier Interactive Patient Education  2018 [employer].

## 2017-06-12 NOTE — Assessment & Plan Note (Signed)
Prolia discussed 

## 2017-06-14 DIAGNOSIS — S92912A Unspecified fracture of left toe(s), initial encounter for closed fracture: Secondary | ICD-10-CM | POA: Insufficient documentation

## 2017-06-14 NOTE — Assessment & Plan Note (Signed)
left little toe fracture.  See comments to the x-ray

## 2017-06-15 ENCOUNTER — Telehealth: Payer: Self-pay | Admitting: Internal Medicine

## 2017-06-15 NOTE — Telephone Encounter (Signed)
Copied from [place] [id number]. Topic: Quick Communication - See Telephone Encounter >> Jun 15, 2017  3:40 PM Cleaster Corin, NT wrote: CRM for notification. See Telephone encounter for:   06/15/17. Pt. Calling to let Dr. Alain Marion about which toes to tape. She said that the there is still some discomfort on the left foot the 2nd toe is what is giving her problems. Pt. Also stated that her foot is still swollen and she needs to know what to do. Pt. Can be reached at [phone]

## 2017-06-16 NOTE — Telephone Encounter (Signed)
Can we have Wendy Serrano see Dr Raeford Razor today for her toe fx? Thx

## 2017-06-16 NOTE — Telephone Encounter (Signed)
Left message for pt to call back to schedule with Dr Raeford Razor

## 2017-06-16 NOTE — Telephone Encounter (Signed)
Message routed to [facility] to call patient today and have her come in for toe fx with Dr. Raeford Razor.

## 2017-06-16 NOTE — Telephone Encounter (Signed)
(  Routed message to Dr. Alain Marion to advise)

## 2017-06-20 ENCOUNTER — Encounter: Payer: Self-pay | Admitting: Family Medicine

## 2017-06-20 ENCOUNTER — Ambulatory Visit: Payer: Medicare Other | Admitting: Family Medicine

## 2017-06-20 VITALS — BP 100/64 | HR 92 | Temp 98.6°F | Ht 67.0 in | Wt 131.0 lb

## 2017-06-20 DIAGNOSIS — A0472 Enterocolitis due to Clostridium difficile, not specified as recurrent: Secondary | ICD-10-CM | POA: Diagnosis not present

## 2017-06-20 DIAGNOSIS — R197 Diarrhea, unspecified: Secondary | ICD-10-CM | POA: Diagnosis not present

## 2017-06-20 NOTE — Patient Instructions (Addendum)
Please call your doctor's office on Monday to get an appointment so they can do more testing for you.   You may use immodium to help calm down the diarrhea.    Dehydration, Adult Dehydration is when there is not enough fluid or water in your body. This happens when you lose more fluids than you take in. Dehydration can range from mild to very bad. It should be treated right away to keep it from getting very bad. Symptoms of mild dehydration may include:  Thirst.  Dry lips.  Slightly dry mouth.  Dry, warm skin.  Dizziness. Symptoms of moderate dehydration may include:  Very dry mouth.  Muscle cramps.  Dark pee (urine). Pee may be the color of tea.  Your body making less pee.  Your eyes making fewer tears.  Heartbeat that is uneven or faster than normal (palpitations).  Headache.  Light-headedness, especially when you stand up from sitting.  Fainting (syncope). Symptoms of very bad dehydration may include:  Changes in skin, such as: ? Cold and clammy skin. ? Blotchy (mottled) or pale skin. ? Skin that does not quickly return to normal after being lightly pinched and let go (poor skin turgor).  Changes in body fluids, such as: ? Feeling very thirsty. ? Your eyes making fewer tears. ? Not sweating when body temperature is high, such as in hot weather. ? Your body making very little pee.  Changes in vital signs, such as: ? Weak pulse. ? Pulse that is more than 100 beats a minute when you are sitting still. ? Fast breathing. ? Low blood pressure.  Other changes, such as: ? Sunken eyes. ? Cold hands and feet. ? Confusion. ? Lack of energy (lethargy). ? Trouble waking up from sleep. ? Short-term weight loss. ? Unconsciousness. Follow these instructions at home:  If told by your doctor, drink an ORS: ? Make an ORS by using instructions on the package. ? Start by drinking small amounts, about  cup (120 mL) every 5-10 minutes. ? Slowly drink more until you  have had the amount that your doctor said to have.  Drink enough clear fluid to keep your pee clear or pale yellow. If you were told to drink an ORS, finish the ORS first, then start slowly drinking clear fluids. Drink fluids such as: ? Water. Do not drink only water by itself. Doing that can make the salt (sodium) level in your body get too low (hyponatremia). ? Ice chips. ? Fruit juice that you have added water to (diluted). ? Low-calorie sports drinks.  Avoid: ? Alcohol. ? Drinks that have a lot of sugar. These include high-calorie sports drinks, fruit juice that does not have water added, and soda. ? Caffeine. ? Foods that are greasy or have a lot of fat or sugar.  Take over-the-counter and prescription medicines only as told by your doctor.  Do not take salt tablets. Doing that can make the salt level in your body get too high (hypernatremia).  Eat foods that have minerals (electrolytes). Examples include bananas, oranges, potatoes, tomatoes, and spinach.  Keep all follow-up visits as told by your doctor. This is important. Contact a doctor if:  You have belly (abdominal) pain that: ? Gets worse. ? Stays in one area (localizes).  You have a rash.  You have a stiff neck.  You get angry or annoyed more easily than normal (irritability).  You are more sleepy than normal.  You have a harder time waking up than normal.  You  feel: ? Weak. ? Dizzy. ? Very thirsty.  You have peed (urinated) only a small amount of very dark pee during 6-8 hours. Get help right away if:  You have symptoms of very bad dehydration.  You cannot drink fluids without throwing up (vomiting).  Your symptoms get worse with treatment.  You have a fever.  You have a very bad headache.  You are throwing up or having watery poop (diarrhea) and it: ? Gets worse. ? Does not go away.  You have blood or something green (bile) in your throw-up.  You have blood in your poop (stool). This may  cause poop to look black and tarry.  You have not peed in 6-8 hours.  You pass out (faint).  Your heart rate when you are sitting still is more than 100 beats a minute.  You have trouble breathing. This information is not intended to replace advice given to you by your health care provider. Make sure you discuss any questions you have with your health care provider. Document Released: 02/22/2009 Document Revised: 11/16/2015 Document Reviewed: 06/22/2015 Elsevier Interactive Patient Education  2018 [employer].

## 2017-06-20 NOTE — Progress Notes (Signed)
Subjective  CC:  Chief Complaint  Patient presents with  . Diarrhea    x 4 days, 3-4 times a day     HPI: Wendy Serrano is a 72 y.o. female who presents to the office today to address the problems listed above in the chief complaint.  Pleasant 72 yo with recurrent diarrhea. Started 4 days ago and is watery; nonpainful, no blood or mucous and no associated f/c or n/v. Was treated for C.diff with flagyl 1/22 and completed tx about a week ago. Otherwise feels well. Nl appetite. Hasn't been drinking much (that is her usual). Denies orthostatic sxs.   I reviewed the patients updated PMH, FH, and SocHx.    Patient Active Problem List   Diagnosis Date Noted  . Toe fracture, left 06/14/2017  . Edema 06/12/2017  . Diarrhea 06/03/2017  . C. difficile diarrhea 06/03/2017  . Infected sebaceous cyst of skin 04/25/2017  . Neck mass 04/25/2017  . 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 Benign paroxysmal positional vertigo 08/23/2015  . Knee pain, left 08/23/2015  . Elevated serum cholesterol 12/25/2011  . Well adult exam 03/01/2011  . Osteoporosis 02/19/2010  . 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 ALLERGIC RHINITIS 05/12/2007  . HERPES SIMPLEX, UNCOMPLICATED 42/68/3419   Current Meds  Medication Sig  . acetaminophen (TYLENOL) 650 MG CR tablet Take 650 mg by mouth every 8 (eight) hours as needed for pain.  . calcium-vitamin D (OSCAL WITH D) 500-200 MG-UNIT per tablet Take 1 tablet by mouth 2 (two) times daily.    . folic acid (FOLVITE) 1 MG tablet Take 1 mg by mouth daily.  . Garlic TABS Take 2 tablets by mouth 2 (two) times daily. Kyolic  . glucosamine-chondroitin 500-400 MG tablet Take 1 tablet by mouth 2 (two) times daily.  . hydrocortisone valerate cream (WESTCORT) 0.2 % Apply topically 2 (two) times daily.  . methotrexate (RHEUMATREX) 2.5 MG tablet TAKE 5 TABLETS BY MOUTH ONCE WEEKLY AS DIRECTED  . Multiple Vitamin (MULTIVITAMIN) tablet Take 1 tablet by mouth daily.    . Multiple  Vitamins-Minerals (OCUVITE ADULT 50+) CAPS Take 1 capsule by mouth daily.  . prednisoLONE acetate (PREDNISOLONE ACETATE P-F) 1 % ophthalmic suspension Place 1 drop into both eyes 3 (three) times daily.  [name] SIMBRINZA 1-0.2 % SUSP Place 1 drop into both eyes 3 (three) times daily.  [name] triamcinolone cream (KENALOG) 0.1 % Apply 1 application topically 3 (three) times daily as needed.    Allergies: Patient has No Known Allergies. Family History: Patient family history includes Diabetes in her mother; Hyperlipidemia in her sister; Pancreatic cancer in her mother; Prostate cancer in her father. Social History:  Patient  reports that  has never smoked. she has never used smokeless tobacco. She reports that she does not drink alcohol or use drugs.  Review of Systems: Constitutional: Negative for fever malaise or anorexia Cardiovascular: negative for chest pain Respiratory: negative for SOB or persistent cough Gastrointestinal: negative for abdominal pain  Objective  Vitals: BP 100/64 (BP Location: Left Arm, Patient Position: Sitting, Cuff Size: Normal)   Pulse 92   Temp 98.6 F (37 C) (Oral)   Ht 5\' 7"  (1.702 m)   Wt 131 lb (59.4 kg)   SpO2 99%   BMI 20.52 kg/m  General: no acute distress , A&Ox3, appears well HEENT: PEERL, conjunctiva normal, Oropharynx mildly dry,neck is supple Cardiovascular:  RRR without murmur or gallop.  Respiratory:  Good breath sounds bilaterally, CTAB with normal respiratory effort Gastrointestinal: soft, flat  abdomen, normal active bowel sounds, no palpable masses, no hepatosplenomegaly, no appreciated hernias Skin:  Warm, no rashes  Assessment  1. Diarrhea, unspecified type   2. C. difficile colitis      Plan   Recurrent diarrhea:  Could be recurrence of C.diff; rec immodium as needed and increase hydration. Recommend f/u at PCP office on Monday am so stool evaluation can be done. Pt is non-toxic now and stable.   Follow up: please call pcp office on  Monday.    Commons side effects, risks, benefits, and alternatives for medications and treatment plan prescribed today were discussed, and the patient expressed understanding of the given instructions. Patient is instructed to call or message via MyChart if he/she has any questions or concerns regarding our treatment plan. No barriers to understanding were identified. We discussed Red Flag symptoms and signs in detail. Patient expressed understanding regarding what to do in case of urgent or emergency type symptoms.   Medication list was reconciled, printed and provided to the patient in AVS. Patient instructions and summary information was reviewed with the patient as documented in the AVS. This note was prepared with assistance of Dragon voice recognition software. Occasional wrong-word or sound-a-like substitutions may have occurred due to the inherent limitations of voice recognition software  No orders of the defined types were placed in this encounter.  No orders of the defined types were placed in this encounter.

## 2017-06-24 ENCOUNTER — Encounter: Payer: Self-pay | Admitting: Internal Medicine

## 2017-06-24 ENCOUNTER — Ambulatory Visit: Payer: Medicare Other | Admitting: Internal Medicine

## 2017-06-24 ENCOUNTER — Other Ambulatory Visit: Payer: Medicare Other

## 2017-06-24 DIAGNOSIS — A0472 Enterocolitis due to Clostridium difficile, not specified as recurrent: Secondary | ICD-10-CM

## 2017-06-24 DIAGNOSIS — H209 Unspecified iridocyclitis: Secondary | ICD-10-CM

## 2017-06-24 DIAGNOSIS — M810 Age-related osteoporosis without current pathological fracture: Secondary | ICD-10-CM

## 2017-06-24 MED ORDER — SACCHAROMYCES BOULARDII 250 MG PO CAPS: 250.0000 mg | ORAL_CAPSULE | Freq: Two times a day (BID) | ORAL | 1 refills | Status: DC

## 2017-06-24 MED ORDER — VANCOMYCIN HCL 125 MG PO CAPS: 125.0000 mg | ORAL_CAPSULE | Freq: Four times a day (QID) | ORAL | 0 refills | Status: DC

## 2017-06-24 NOTE — Addendum Note (Signed)
Addended by: Trenda Moots on: 8/42/1031 10:15 AM   Modules accepted: Orders

## 2017-06-24 NOTE — Assessment & Plan Note (Signed)
On MTX

## 2017-06-24 NOTE — Assessment & Plan Note (Addendum)
diff colitis (1/20) treated w/Flagyl. Got better, however last night had a case of loose stool incontinence in her sleep. She took Imodium a few times.  Vancomycin po Imodium prn Florastor po

## 2017-06-24 NOTE — Assessment & Plan Note (Signed)
Discussed Prolia. Will decide on it in 1 month

## 2017-06-24 NOTE — Progress Notes (Signed)
Subjective:  Patient ID: Wendy Serrano, female    DOB: 06-08-1945  Age: 72 y.o. MRN: [id number]  CC: No chief complaint on file.   HPI [name] presents for C diff colitis (1/20) treated w/Flagyl. Got better, however last night had a case of loose stool incontinence in her sleep. She took Imodium a few times. On MTX for her eyes  Outpatient Medications Prior to Visit  Medication Sig Dispense Refill  . calcium-vitamin D (OSCAL WITH D) 500-200 MG-UNIT per tablet Take 1 tablet by mouth 2 (two) times daily.      . folic acid (FOLVITE) 1 MG tablet Take 5 mg by mouth daily.     . Garlic TABS Take 2 tablets by mouth 2 (two) times daily. Kyolic    . glucosamine-chondroitin 500-400 MG tablet Take 1 tablet by mouth 2 (two) times daily.    . hydrocortisone valerate cream (WESTCORT) 0.2 % Apply topically 2 (two) times daily. (Patient taking differently: Apply topically as needed. ) 45 g 2  . methotrexate (RHEUMATREX) 2.5 MG tablet TAKE 8 TABLETS BY MOUTH ONCE WEEKLY AS DIRECTED  3  . Multiple Vitamin (MULTIVITAMIN) tablet Take 1 tablet by mouth daily.      . Multiple Vitamins-Minerals (OCUVITE ADULT 50+) CAPS Take 1 capsule by mouth daily.    . prednisoLONE acetate (PREDNISOLONE ACETATE P-F) 1 % ophthalmic suspension Place 1 drop into both eyes 3 (three) times daily.    [name] SIMBRINZA 1-0.2 % SUSP Place 1 drop into both eyes 3 (three) times daily.  6  . triamcinolone cream (KENALOG) 0.1 % Apply 1 application topically 3 (three) times daily as needed. 450 g 3  . acetaminophen (TYLENOL) 650 MG CR tablet Take 650 mg by mouth every 8 (eight) hours as needed for pain.     No facility-administered medications prior to visit.     ROS Review of Systems  Constitutional: Positive for fatigue and unexpected weight change. Negative for activity change, appetite change and chills.  HENT: Negative for congestion, mouth sores and sinus pressure.   Eyes: Negative for visual disturbance.  Respiratory:  Negative for cough and chest tightness.   Gastrointestinal: Positive for diarrhea. Negative for abdominal pain, blood in stool, constipation, nausea and rectal pain.  Genitourinary: Negative for difficulty urinating, frequency and vaginal pain.  Musculoskeletal: Negative for back pain and gait problem.  Skin: Negative for pallor and rash.  Neurological: Negative for dizziness, tremors, weakness, numbness and headaches.  Psychiatric/Behavioral: Negative for confusion and sleep disturbance.   Thin  Objective:  BP 108/66 (BP Location: Left Arm, Patient Position: Sitting, Cuff Size: Normal)   Pulse (!) 135   Temp 99.1 F (37.3 C) (Oral)   Ht 5\' 7"  (1.702 m)   Wt 129 lb (58.5 kg)   SpO2 98%   BMI 20.20 kg/m   BP Readings from Last 3 Encounters:  06/24/17 108/66  06/20/17 100/64  06/12/17 130/70    Wt Readings from Last 3 Encounters:  06/24/17 129 lb (58.5 kg)  06/20/17 131 lb (59.4 kg)  06/12/17 136 lb (61.7 kg)    Physical Exam  Constitutional: She appears well-developed. No distress.  HENT:  Head: Normocephalic.  Right Ear: External ear normal.  Left Ear: External ear normal.  Nose: Nose normal.  Mouth/Throat: Oropharynx is clear and moist.  Eyes: Conjunctivae are normal. Pupils are equal, round, and reactive to light. Right eye exhibits no discharge. Left eye exhibits no discharge.  Neck: Normal range of motion. Neck supple. No  JVD present. No tracheal deviation present. No thyromegaly present.  Cardiovascular: Normal rate, regular rhythm and normal heart sounds.  Pulmonary/Chest: No stridor. No respiratory distress. She has no wheezes.  Abdominal: Soft. Bowel sounds are normal. She exhibits no distension and no mass. There is no tenderness. There is no rebound and no guarding.  Musculoskeletal: She exhibits no edema or tenderness.  Lymphadenopathy:    She has no cervical adenopathy.  Neurological: She displays normal reflexes. No cranial nerve deficit. She exhibits  normal muscle tone. Coordination normal.  Skin: No rash noted. No erythema.  Psychiatric: She has a normal mood and affect. Her behavior is normal. Judgment and thought content normal.    Lab Results  Component Value Date   WBC 4.1 06/12/2017   HGB 12.8 06/12/2017   HCT 38.5 06/12/2017   PLT 431.0 (H) 06/12/2017   GLUCOSE 81 06/12/2017   CHOL 178 06/12/2017   TRIG 80.0 06/12/2017   HDL 74.60 06/12/2017   LDLDIRECT 127.9 03/09/2013   LDLCALC 87 06/12/2017   ALT 24 06/12/2017   AST 25 06/12/2017   NA 141 06/12/2017   K 3.9 06/12/2017   CL 106 06/12/2017   CREATININE 0.94 06/12/2017   BUN 19 06/12/2017   CO2 28 06/12/2017   TSH 3.43 06/12/2017   INR 1.1 (H) 05/16/2015   HGBA1C 5.4 02/26/2011    Dg Bone Density  Result Date: 06/12/2017 Date of study: 06/12/2017 Exam: DUAL X-RAY ABSORPTIOMETRY (DXA) FOR BONE MINERAL DENSITY (BMD) Instrument: [employer] Requesting Provider: PCP Indication: follow up for osteoporosis Comparison: 03/21/2015 Clinical data: Pt is a 72 y.o. female without history of fracture. On calcium and vitamin D. Results:  Lumbar spine L1-L2 (L3, L4) Femoral neck (FN) T-score -1.1 RFN: -2.0 LFN: -2.7 Change in BMD from previous DXA test (%) -1.2% -3.9% (*) statistically significant Assessment: Patient has OSTEOPOROSIS according to the [facility] classification for osteoporosis (see below). Fracture risk: high Comments: the technical quality of the study is good Evaluation for secondary causes should be considered if clinically indicated. Recommend optimizing calcium (1200 mg/day) and vitamin D (800 IU/day). Followup: Repeat BMD is appropriate after 2 years or after 1-2 years if starting treatment. WHO criteria for diagnosis of osteoporosis in postmenopausal women and in men 36 y/o or older: - normal: T-score -1.0 to + 1.0 - osteopenia/low bone density: T-score between -2.5 and -1.0 - osteoporosis: T-score below -2.5 - severe osteoporosis: T-score below -2.5 with history of  fragility fracture Note: although not part of the WHO classification, the presence of a fragility fracture, regardless of the T-score, should be considered diagnostic of osteoporosis, provided other causes for the fracture have been excluded. Treatment: The National Osteoporosis Foundation recommends that treatment be considered in postmenopausal women and men age 77 or older with: 1. Hip or vertebral (clinical or morphometric) fracture 2. T-score of - 2.5 or lower at the spine or hip 3. 10-year fracture probability by FRAX of at least 20% for a major osteoporotic fracture and 3% for a hip fracture Philemon Kingdom, MD [facility]   Dg Foot Complete Left  Result Date: 06/12/2017 CLINICAL DATA:  Left foot swelling and pain for 3 days. No known injury. EXAM: LEFT FOOT - COMPLETE 3+ VIEW COMPARISON:  None. FINDINGS: There is a nondisplaced, non comminuted fracture of the distal phalanx of the fifth toe, across the base of the distal tuft. No other fractures. There is mild asymmetric joint space narrowing at the first metatarsophalangeal joint. Remaining joints are normally  spaced and aligned. Bones are demineralized. There are vascular calcifications extending across the ankle. Soft tissues are otherwise unremarkable. IMPRESSION: 1. Nondisplaced, non comminuted fracture of the distal phalanx of the left fifth toe. Electronically Signed   By: Lajean Manes M.D.   On: 06/12/2017 13:10   Vas [place] Lower Extremity Venous (dvt)  Result Date: 06/12/2017  Lower Venous DVT Study Indication: Edema. Examination Guidelines: A complete evaluation includes B-mode imaging, spectral doppler, color doppler, and power doppler as needed of all accessible portions of each vessel. Bilateral testing is considered an integral part of a complete examination. Limited examinations for reoccurring indications may be performed as noted. The reflux portion of the exam is performed with the patient in reverse Trendelenburg.  Right  Venous Findings: +---------+---------------+---------+-----------+----------+-------+          CompressibilityPhasicitySpontaneityPropertiesSummary +---------+---------------+---------+-----------+----------+-------+ CFV      Full           Yes      Yes                          +---------+---------------+---------+-----------+----------+-------+ FV Prox  Full           Yes      Yes                          +---------+---------------+---------+-----------+----------+-------+ FV Mid   Full           Yes      Yes                          +---------+---------------+---------+-----------+----------+-------+ FV DistalFull           Yes      Yes                          +---------+---------------+---------+-----------+----------+-------+ PFV      Full           Yes      Yes                          +---------+---------------+---------+-----------+----------+-------+ POP      Full           Yes      Yes                          +---------+---------------+---------+-----------+----------+-------+ PTV      Full           Yes      Yes                          +---------+---------------+---------+-----------+----------+-------+ PERO     Full           Yes      Yes                          +---------+---------------+---------+-----------+----------+-------+ GSV      Full           Yes      Yes                          +---------+---------------+---------+-----------+----------+-------+ SSV      Full           Yes  Yes                          +---------+---------------+---------+-----------+----------+-------+  Left Venous Findings: +---------+---------------+---------+-----------+----------+-------+          CompressibilityPhasicitySpontaneityPropertiesSummary +---------+---------------+---------+-----------+----------+-------+ CFV      Full           Yes      Yes                           +---------+---------------+---------+-----------+----------+-------+ FV Prox  Full           Yes      Yes                          +---------+---------------+---------+-----------+----------+-------+ FV Mid   Full           Yes      Yes                          +---------+---------------+---------+-----------+----------+-------+ FV DistalFull           Yes      Yes                          +---------+---------------+---------+-----------+----------+-------+ PFV      Full           Yes      Yes                          +---------+---------------+---------+-----------+----------+-------+ POP      Full           Yes      Yes                          +---------+---------------+---------+-----------+----------+-------+ PTV      Full           Yes      Yes                          +---------+---------------+---------+-----------+----------+-------+ PERO     Full           Yes      Yes                          +---------+---------------+---------+-----------+----------+-------+ GSV      Full           Yes      Yes                          +---------+---------------+---------+-----------+----------+-------+ SSV      Full           Yes      Yes                          +---------+---------------+---------+-----------+----------+-------+   Findings reported to [facility] at 3:30.  Final Interpretation: Right: No evidence of deep vein thrombosis in the lower extremity. No indirect evidence of obstruction proximal to the inguinal ligament. Left: No evidence of deep vein thrombosis in the lower extremity. No indirect evidence of obstruction proximal to the inguinal ligament.  *See table(s) above for measurements and observations. Electronically signed by Adele Barthel on  06/12/2017 at 3:46:42 PM.   Assessment & Plan:   There are no diagnoses linked to this encounter. I have discontinued Shamari Yorks's acetaminophen. I am also having her maintain her multivitamin,  calcium-vitamin D, Garlic, glucosamine-chondroitin, SIMBRINZA, folic acid, methotrexate, OCUVITE ADULT 50+, triamcinolone cream, hydrocortisone valerate cream, and prednisoLONE acetate.  No orders of the defined types were placed in this encounter.    Follow-up: No Follow-up on file.  Walker Kehr, MD

## 2017-06-25 ENCOUNTER — Telehealth: Payer: Self-pay | Admitting: Internal Medicine

## 2017-06-25 NOTE — Telephone Encounter (Signed)
Patient called with questions about the Florastor and Vancomycin prescribed yesterday. She asked about taking the [facility] brand probiotic vs Florastor. I asked how many mg is on the box for the [facility] brand, she says "there is nothing saying how much is in each pill." I advised to take the prescribed Florastor to make sure she is getting the correct mg that is ordered. She asked "when should I start the vancomycin? I understood him to say when I have diarrhea, then it was something about waiting on the stool test result. Are the results back yet?" I advised the results are not in her chart and that according to his note from the visit, there is no indication to start with a diarrhea stool. I advised I will submit this question to Dr. Alain Marion and someone will be in touch with an answer. I asked does she have mychart, she says "yes, and he can answer me there or a phone call, either way is fine."

## 2017-06-25 NOTE — Telephone Encounter (Signed)
Copied from [place]. Topic: Quick Communication - See Telephone Encounter >> Jun 25, 2017  3:59 PM Bea Graff, NT wrote: CRM for notification. See Telephone encounter for: Pt calling requesting a call from Dr. Alain Marion or his nurse to get instructions on how/when to take her medications she was prescribed yesterday. The Vancomycin and  saccharomyces boulardii (FLORASTOR). Please call pt to advise.   06/25/17.

## 2017-06-26 LAB — CLOSTRIDIUM DIFFICILE EIA: C DIFFICILE TOXINS A+ B, EIA: POSITIVE — AB

## 2017-06-26 LAB — SPECIMEN STATUS REPORT

## 2017-06-26 NOTE — Telephone Encounter (Signed)
Pts spouse calling back today requesting a call to see if Dr. Alain Marion has had a chance to look at pts stool sample and instructions on if pt should take the Vancomycin. Please contact pt or spouse. CB#: [phone]

## 2017-06-26 NOTE — Telephone Encounter (Signed)
Please advise 

## 2017-06-26 NOTE — Telephone Encounter (Signed)
Pt called - please leave message on machine if no answer

## 2017-06-27 ENCOUNTER — Encounter: Payer: Self-pay | Admitting: Internal Medicine

## 2017-06-27 NOTE — Telephone Encounter (Signed)
I have sent this message via my chart:  Dear Wendy Serrano, Please start your vancomycin and Florastor as prescribed.  I am sorry about the misunderstanding: You should have start the medicines on the day when they were prescribed.  Your stool test was positive for C. Difficile.  Hope you feel better soon. Thanks, AP

## 2017-07-06 ENCOUNTER — Ambulatory Visit: Payer: Medicare Other | Admitting: Family Medicine

## 2017-07-15 ENCOUNTER — Ambulatory Visit: Payer: Medicare Other | Admitting: Family

## 2017-07-15 ENCOUNTER — Encounter: Payer: Self-pay | Admitting: Family

## 2017-07-15 ENCOUNTER — Other Ambulatory Visit (INDEPENDENT_AMBULATORY_CARE_PROVIDER_SITE_OTHER): Payer: Medicare Other

## 2017-07-15 VITALS — BP 114/78 | HR 87 | Temp 98.4°F | Ht 67.0 in | Wt 126.1 lb

## 2017-07-15 DIAGNOSIS — R197 Diarrhea, unspecified: Secondary | ICD-10-CM | POA: Diagnosis not present

## 2017-07-15 LAB — COMPREHENSIVE METABOLIC PANEL
ALT: 17 U/L (ref 0–35)
AST: 20 U/L (ref 0–37)
Albumin: 4.1 g/dL (ref 3.5–5.2)
Alkaline Phosphatase: 61 U/L (ref 39–117)
BILIRUBIN TOTAL: 0.9 mg/dL (ref 0.2–1.2)
BUN: 13 mg/dL (ref 6–23)
CO2: 29 mEq/L (ref 19–32)
Calcium: 10.1 mg/dL (ref 8.4–10.5)
Chloride: 101 mEq/L (ref 96–112)
Creatinine, Ser: 0.82 mg/dL (ref 0.40–1.20)
GFR: 87.91 mL/min (ref >60.00)
GLUCOSE: 91 mg/dL (ref 70–99)
Potassium: 3.1 mEq/L — ABNORMAL LOW (ref 3.5–5.1)
SODIUM: 139 meq/L (ref 135–145)
Total Protein: 7.5 g/dL (ref 6.0–8.3)

## 2017-07-15 LAB — CBC WITH DIFFERENTIAL/PLATELET
BASOS ABS: 0 10*3/uL (ref 0.0–0.1)
Basophils Relative: 0.6 % (ref 0.0–3.0)
Eosinophils Absolute: 0.4 10*3/uL (ref 0.0–0.7)
Eosinophils Relative: 4.8 % (ref 0.0–5.0)
HCT: 36.7 % (ref 36.0–46.0)
Hemoglobin: 12.4 g/dL (ref 12.0–15.0)
LYMPHS ABS: 0.9 10*3/uL (ref 0.7–4.0)
Lymphocytes Relative: 12.2 % (ref 12.0–46.0)
MCHC: 33.9 g/dL (ref 30.0–36.0)
MCV: 93.4 fl (ref 78.0–100.0)
MONOS PCT: 10.6 % (ref 3.0–12.0)
Monocytes Absolute: 0.8 10*3/uL (ref 0.1–1.0)
NEUTROS ABS: 5.5 10*3/uL (ref 1.4–7.7)
NEUTROS PCT: 71.8 % (ref 43.0–77.0)
PLATELETS: 322 10*3/uL (ref 150.0–400.0)
RBC: 3.93 Mil/uL (ref 3.87–5.11)
RDW: 16.1 % — ABNORMAL HIGH (ref 11.5–15.5)
WBC: 7.7 10*3/uL (ref 4.0–10.5)

## 2017-07-15 MED ORDER — DIPHENOXYLATE-ATROPINE 2.5-0.025 MG PO TABS: 1.0000 | ORAL_TABLET | Freq: Four times a day (QID) | ORAL | 0 refills | Status: DC | PRN

## 2017-07-15 NOTE — Progress Notes (Signed)
Wendy Serrano is a 72 y.o. female with the following history as recorded in [employer]:  Patient Active Problem List   Diagnosis Date Noted  . Toe fracture, left 06/14/2017  . Edema 06/12/2017  . Diarrhea 06/03/2017  . C. difficile diarrhea 06/03/2017  . Infected sebaceous cyst of skin 04/25/2017  . Neck mass 04/25/2017  . 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 Benign paroxysmal positional vertigo 08/23/2015  . Knee pain, left 08/23/2015  . Elevated serum cholesterol 12/25/2011  . Well adult exam 03/01/2011  . Osteoporosis 02/19/2010  . 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 ALLERGIC RHINITIS 05/12/2007  . HERPES SIMPLEX, UNCOMPLICATED 21/97/5883    Current Outpatient Medications  Medication Sig Dispense Refill  . calcium-vitamin D (OSCAL WITH D) 500-200 MG-UNIT per tablet Take 1 tablet by mouth 2 (two) times daily.      . diclofenac sodium (VOLTAREN) 1 % GEL diclofenac 1 % topical gel  APPLY 2 GRAMS TO AFFECTED AREA(S) 4 TIMES A DAY    . folic acid (FOLVITE) 1 MG tablet Take 5 mg by mouth daily.     . Garlic TABS Take 2 tablets by mouth 2 (two) times daily. Kyolic    . glucosamine-chondroitin 500-400 MG tablet Take 1 tablet by mouth 2 (two) times daily.    . hydrocortisone valerate cream (WESTCORT) 0.2 % Apply topically 2 (two) times daily. (Patient taking differently: Apply topically as needed. ) 45 g 2  . LUTEIN PO Take by mouth.    . methotrexate (RHEUMATREX) 2.5 MG tablet TAKE 8 TABLETS BY MOUTH ONCE WEEKLY AS DIRECTED  3  . Multiple Vitamin (MULTIVITAMIN) tablet Take 1 tablet by mouth daily.      . Multiple Vitamins-Minerals (OCUVITE ADULT 50+) CAPS Take 1 capsule by mouth daily.    . prednisoLONE acetate (PREDNISOLONE ACETATE P-F) 1 % ophthalmic suspension Place 1 drop into both eyes 3 (three) times daily.    [name] saccharomyces boulardii (FLORASTOR) 250 MG capsule Take 1 capsule (250 mg total) by mouth 2 (two) times daily. 60 capsule 1  . SIMBRINZA 1-0.2 % SUSP Place 1 drop into  both eyes 3 (three) times daily.  6  . triamcinolone cream (KENALOG) 0.1 % Apply 1 application topically 3 (three) times daily as needed. 450 g 3  . vancomycin (VANCOCIN HCL) 125 MG capsule Take 1 capsule (125 mg total) by mouth 4 (four) times daily. 40 capsule 0  . diphenoxylate-atropine (LOMOTIL) 2.5-0.025 MG tablet Take 1 tablet by mouth 4 (four) times daily as needed for diarrhea or loose stools. 30 tablet 0   No current facility-administered medications for this visit.     Allergies: Patient has no known allergies.  Past Medical History:  Diagnosis Date  . Allergic rhinitis   . 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 Eczema    childhood  . H/O osteopenia 01/09/04  . H/O osteoporosis 2008  . H/O varicella   . 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 History of measles, mumps, or rubella   . Leukopenia    chronic  . Liver hemangioma   . 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 Menopausal symptoms   . Renal cyst    bilateral  . Rotator cuff tear   . Yeast infection     Past Surgical History:  Procedure Laterality Date  . ROTATOR CUFF REPAIR Left 08/2009   Left shoulder-rotator cuff repair [facility])  . WISDOM TOOTH EXTRACTION  Family History  Problem Relation Age of Onset  . Prostate cancer Father        died  . Pancreatic cancer Mother        died  . Diabetes Mother   . Hyperlipidemia Sister     Social History   Tobacco Use  . Smoking status: Never Smoker  . Smokeless tobacco: Never Used  Substance Use Topics  . Alcohol use: No    Subjective:  Patient was diagnosed with C. Diff in mid-February; was treated with 10 days of Vancomycin and Florastor and was feeling much better; however, on Friday, ate ice cream/ walnuts and on Saturday, ate scallops with cheese; by Sunday, 07/10/2017 diarrhea re-started; states that Monday and Tuesday, symptoms were very bad; did take Immodium on Monday and Tuesday and notes that symptoms are  somewhat better today; denies any blood in the stool;  Notes she has actually not had further diarrhea today; denies any abdominal cramping; has lost 10+ pounds since early February; admits that her appetite is not back to normal;   Objective:  Vitals:   07/15/17 1355  BP: 114/78  Pulse: 87  Temp: 98.4 F (36.9 C)  TempSrc: Oral  SpO2: 99%  Weight: 126 lb 1.9 oz (57.2 kg)  Height: '5\' 7"'$  (1.702 m)    General: Well developed, well nourished, in no acute distress  Skin : Warm and dry.  Head: Normocephalic and atraumatic  Lungs: Respirations unlabored; clear to auscultation bilaterally without wheeze, rales, rhonchi  CVS exam: normal rate and regular rhythm.  Abdomen: Soft; nontender; nondistended; normoactive bowel sounds; no masses or hepatosplenomegaly  Neurologic: Alert and oriented; speech intact; face symmetrical; moves all extremities well; CNII-XII intact without focal deficit   Assessment:  1. Diarrhea, unspecified type     Plan:  Patient was treated appropriately for C. Diff and responded well to treatment; ? If recent symptoms are related to dairy intolerance; weight loss is concerning as well; will start with GI evaluation- patient is requesting to only see MDs; may need to consider ID if diarrhea persisting; will check CBC, CMP today; discussed need to work on hydration/ electrolyte replacement with Gatorade, bananas, oranges; Rx for Lomotil given to use as needed until she can see GI; follow-up to be determined.    No Follow-up on file.  Orders Placed This Encounter  Procedures  . CBC w/Diff    Standing Status:   Future    Number of Occurrences:   1    Standing Expiration Date:   07/15/2018  . Comp Met (CMET)    Standing Status:   Future    Number of Occurrences:   1    Standing Expiration Date:   07/15/2018  . Ambulatory referral to Gastroenterology    Referral Priority:   Urgent    Referral Type:   Consultation    Referral Reason:   Specialty Services Required     Number of Visits Requested:   1    Requested Prescriptions   Signed Prescriptions Disp Refills  . diphenoxylate-atropine (LOMOTIL) 2.5-0.025 MG tablet 30 tablet 0    Sig: Take 1 tablet by mouth 4 (four) times daily as needed for diarrhea or loose stools.

## 2017-07-16 ENCOUNTER — Other Ambulatory Visit: Payer: Self-pay | Admitting: Family

## 2017-07-16 MED ORDER — POTASSIUM CHLORIDE ER 10 MEQ PO TBCR: 10.0000 meq | EXTENDED_RELEASE_TABLET | Freq: Every day | ORAL | 0 refills | Status: DC

## 2017-07-21 ENCOUNTER — Other Ambulatory Visit (INDEPENDENT_AMBULATORY_CARE_PROVIDER_SITE_OTHER): Payer: Medicare Other

## 2017-07-21 ENCOUNTER — Other Ambulatory Visit: Payer: Self-pay

## 2017-07-21 DIAGNOSIS — R197 Diarrhea, unspecified: Secondary | ICD-10-CM

## 2017-07-21 LAB — COMPREHENSIVE METABOLIC PANEL
ALT: 18 U/L (ref 0–35)
AST: 16 U/L (ref 0–37)
Albumin: 3.8 g/dL (ref 3.5–5.2)
Alkaline Phosphatase: 56 U/L (ref 39–117)
BILIRUBIN TOTAL: 0.4 mg/dL (ref 0.2–1.2)
BUN: 11 mg/dL (ref 6–23)
CO2: 30 meq/L (ref 19–32)
Calcium: 9.5 mg/dL (ref 8.4–10.5)
Chloride: 102 mEq/L (ref 96–112)
Creatinine, Ser: 0.82 mg/dL (ref 0.40–1.20)
GFR: 87.9 mL/min (ref >60.00)
GLUCOSE: 91 mg/dL (ref 70–99)
Potassium: 3.7 mEq/L (ref 3.5–5.1)
SODIUM: 139 meq/L (ref 135–145)
TOTAL PROTEIN: 7 g/dL (ref 6.0–8.3)

## 2017-07-27 ENCOUNTER — Ambulatory Visit: Payer: Medicare Other | Admitting: Internal Medicine

## 2017-07-29 ENCOUNTER — Ambulatory Visit: Payer: Medicare Other | Admitting: Internal Medicine

## 2017-07-29 ENCOUNTER — Encounter: Payer: Self-pay | Admitting: Internal Medicine

## 2017-07-29 VITALS — BP 110/70 | HR 72 | Ht 67.0 in | Wt 129.6 lb

## 2017-07-29 DIAGNOSIS — A0472 Enterocolitis due to Clostridium difficile, not specified as recurrent: Secondary | ICD-10-CM | POA: Diagnosis not present

## 2017-07-29 MED ORDER — SACCHAROMYCES BOULARDII 250 MG PO CAPS: 250.0000 mg | ORAL_CAPSULE | Freq: Every day | ORAL | 1 refills | Status: DC

## 2017-07-29 NOTE — Patient Instructions (Addendum)
You may resume your regular diet.  Decrease your florastor to one daily. Take this thru April and then take it anytime your on anabiotics.    Follow up with Dr Carlean Purl as needed.  We are giving you information to read on C. Diff.    I appreciate the opportunity to care for you. Silvano Rusk, MD, [facility]

## 2017-07-29 NOTE — Progress Notes (Signed)
Wendy Serrano 72 y.o. [birth year] [id number]  Assessment & Plan:   Encounter Diagnosis  Name Primary?  . C. difficile colitis Yes   She seems recovered.  Note that new guidelines suggest not using metronidazole for C. difficile due to lower efficacy so would not use that again in her, if she has recurrent symptoms would go ahead and treat with vancomycin I do not think testing would be necessary most likely.  If she has persistent watery diarrhea I think starting treatment with vancomycin 125 mg 4 times daily and a taper would make sense most likely.  Subsequent to that could consider fecal transplant.  I discussed this information with her today and also gave her a handout regarding C. difficile.  She may return to her regular diet and see me as needed.  She should stay on Florastor through the end of April, she should take that if she takes any antibiotics again and if she wants to continue it on a daily basis 1 a day I think that is reasonable.  She is on methotrexate for uveitis, that may predispose her to C. difficile somewhat, but I would not discontinue it based upon the C. difficile if it is providing benefit.  I appreciate the opportunity to care for this patient. CC: Plotnikov, Evie Lacks, MD   Subjective:   Chief Complaint: C. difficile colitis  HPI The patient is an elderly African-American woman here with her husband, she had taken some antibiotics for a sebaceous cyst that was infected at least a couple of times in late 2018, she then got diarrhea January 20 was positive for C. difficile she was treated with metronidazole with improvement but then had a rapid recurrence of diarrhea and was treated with vancomycin and is now having one mostly formed stool a day.  She still is somewhat weak and not totally back to normal.  She is eating a soft bland diet and has not returned to her prior diet and would like to eat ice cream with knots again.  She had a colonoscopy for screening  in 2014 that was negative.  She remains on Florastor 250 mg twice daily No Known Allergies Current Meds  Medication Sig  . calcium-vitamin D (OSCAL WITH D) 500-200 MG-UNIT per tablet Take 1 tablet by mouth 2 (two) times daily.    . diclofenac sodium (VOLTAREN) 1 % GEL diclofenac 1 % topical gel  APPLY 2 GRAMS TO AFFECTED AREA(S) 4 TIMES A DAY  . folic acid (FOLVITE) 1 MG tablet Take 5 mg by mouth daily.   . Garlic TABS Take 2 tablets by mouth 2 (two) times daily. Kyolic  . glucosamine-chondroitin 500-400 MG tablet Take 1 tablet by mouth 2 (two) times daily.  . hydrocortisone valerate cream (WESTCORT) 0.2 % Apply topically 2 (two) times daily. (Patient taking differently: Apply topically as needed. )  . LUTEIN PO Take by mouth.  . methotrexate (RHEUMATREX) 2.5 MG tablet TAKE 8 TABLETS BY MOUTH ONCE WEEKLY AS DIRECTED  . Multiple Vitamin (MULTIVITAMIN) tablet Take 1 tablet by mouth daily.    . Multiple Vitamins-Minerals (OCUVITE ADULT 50+) CAPS Take 1 capsule by mouth daily.  . prednisoLONE acetate (PREDNISOLONE ACETATE P-F) 1 % ophthalmic suspension Place 1 drop into both eyes 3 (three) times daily.  [name] saccharomyces boulardii (FLORASTOR) 250 MG capsule Take 1 capsule (250 mg total) by mouth 2 (two) times daily.  [name] SIMBRINZA 1-0.2 % SUSP Place 1 drop into both eyes 3 (three) times daily.  [name]  triamcinolone cream (KENALOG) 0.1 % Apply 1 application topically 3 (three) times daily as needed.   Past Medical History:  Diagnosis Date  . Allergic rhinitis   . 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 C. difficile diarrhea 06/2017  . Eczema    childhood  . H/O osteopenia 01/09/04  . H/O osteoporosis 2008  . H/O varicella   . 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 History of measles, mumps, or rubella   . Leukopenia    chronic  . Liver hemangioma   . 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 Menopausal symptoms   . Renal cyst    bilateral  . Rotator cuff  tear   . Yeast infection    Past Surgical History:  Procedure Laterality Date  . ROTATOR CUFF REPAIR Left 08/2009   Left shoulder-rotator cuff repair [facility])  . 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 They are doing well   Marriage is in good health and retirement is good   End-of-life: provided packet of information and forms for consideration (Oct'11)   family history includes Diabetes in her mother; Hyperlipidemia in her sister; Pancreatic cancer in her mother; Prostate cancer in her father.   Review of Systems As per HPI  Objective:   Physical Exam BP 110/70   Pulse 72   Ht 5\' 7"  (1.702 m)   Wt 129 lb 9.6 oz (58.8 kg)   BMI 20.30 kg/m  No acute distress  15 minutes time spent with patient > half in counseling coordination of care

## 2017-07-31 DIAGNOSIS — M84375A Stress fracture, left foot, initial encounter for fracture: Secondary | ICD-10-CM | POA: Insufficient documentation

## 2017-08-24 ENCOUNTER — Encounter (HOSPITAL_COMMUNITY): Payer: Self-pay | Admitting: Family Medicine

## 2017-08-24 ENCOUNTER — Ambulatory Visit (HOSPITAL_COMMUNITY)
Admission: EM | Admit: 2017-08-24 | Discharge: 2017-08-24 | Disposition: A | Discharge status: Home / Self Care | Payer: Medicare Other | Attending: Internal Medicine | Admitting: Internal Medicine

## 2017-08-24 DIAGNOSIS — S01111A Laceration without foreign body of right eyelid and periocular area, initial encounter: Secondary | ICD-10-CM | POA: Diagnosis not present

## 2017-08-24 DIAGNOSIS — S0990XA Unspecified injury of head, initial encounter: Secondary | ICD-10-CM

## 2017-08-24 DIAGNOSIS — W19XXXA Unspecified fall, initial encounter: Secondary | ICD-10-CM

## 2017-08-24 MED ORDER — LIDOCAINE-EPINEPHRINE-TETRACAINE (LET) SOLUTION
NASAL | Status: AC
  Filled 2017-08-24: qty 3

## 2017-08-24 NOTE — ED Provider Notes (Signed)
PROCEDURE NOTE: laceration repair Verbal consent obtained from patient.  Local anesthesia with 3cc Lidocaine 2% with epinephrine.  Wound explored for tendon, ligament damage. Wound scrubbed with soap and water and rinsed. Wound closed with #5 6-0 Prolene (simple interrupted) sutures.  Wound cleansed and dressed.    Jaynee Eagles, PA-C 08/24/17 1136

## 2017-08-24 NOTE — ED Triage Notes (Addendum)
Pt here for facial laceration. She was sitting on the tub this am putting her eye drops in and fell over hitting head on the door. Laceration by her right eye. No bleeding. Denies any LOC. No blood thinners, dizziness or vision problems.

## 2017-08-24 NOTE — Discharge Instructions (Addendum)
Wash gently with soap/water 1-2 times daily, apply antibiotic ointment and bandage.  Recheck if any increasing redness/swelling/pain/drainage or new fever>100.5.  Plan for wound check/stitches out in 6 days.  Due to the mild head injury (bumped head), please have any new/persistent headache checked out with your primary care provider or in the ER (if severe).

## 2017-08-24 NOTE — ED Provider Notes (Signed)
Wendy Serrano    CSN: [id number] Arrival date & time: 08/24/17  7341     History   Chief Complaint Chief Complaint  Patient presents with  . Laceration  . Head Injury    HPI Wendy Serrano is a 72 y.o. female.   She presents today after bumping her head between 2 and 4 AM this morning, has a laceration at the right eyebrow.  She typically goes to bed between 2 and 4 AM, was particularly tired last night because she had not had a nap.  She puts in eyedrops before she goes to bed, 2 different drops 5 minutes apart.  She thinks she might have dozed off.  Husband heard a thump and then she immediately was getting herself up, was able to get up unassisted.  Walked into the urgent care independently, was able to climb onto the exam table without help.  No headache.  No neck or back pain.  No injury to lips or tongue, teeth feel like they are in the right place.    HPI  Past Medical History:  Diagnosis Date  . Allergic rhinitis   . 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 C. difficile diarrhea 06/2017  . Eczema    childhood  . H/O osteopenia 01/09/04  . H/O osteoporosis 2008  . H/O varicella   . 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 History of measles, mumps, or rubella   . Leukopenia    chronic  . Liver hemangioma   . 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 Menopausal symptoms   . Renal cyst    bilateral  . Rotator cuff tear   . Yeast infection     Patient Active Problem List   Diagnosis Date Noted  . Toe fracture, left 06/14/2017  . Edema 06/12/2017  . Diarrhea 06/03/2017  . C. difficile diarrhea 06/03/2017  . Infected sebaceous cyst of skin 04/25/2017  . Neck mass 04/25/2017  . 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 Benign paroxysmal positional vertigo 08/23/2015  . Knee pain, left 08/23/2015  . Elevated serum cholesterol 12/25/2011  . Well adult exam 03/01/2011  . Osteoporosis 02/19/2010  . UNSPECIFIED PERIPHERAL VASCULAR DISEASE  11/29/2009  . ALLERGIC RHINITIS 05/12/2007  . HERPES SIMPLEX, UNCOMPLICATED 93/79/0240    Past Surgical History:  Procedure Laterality Date  . ROTATOR CUFF REPAIR Left 08/2009   Left shoulder-rotator cuff repair [facility])  . WISDOM TOOTH EXTRACTION      OB History    Gravida  0   Para  0   Term  0   Preterm  0   AB  0   Living  0     SAB  0   TAB  0   Ectopic  0   Multiple  0   Live Births               Home Medications    Prior to Admission medications   Medication Sig Start Date End Date Taking? Authorizing Provider  calcium-vitamin D (OSCAL WITH D) 500-200 MG-UNIT per tablet Take 1 tablet by mouth 2 (two) times daily.      [provider]  diclofenac sodium (VOLTAREN) 1 % GEL diclofenac 1 % topical gel  APPLY 2 GRAMS TO AFFECTED AREA(S) 4 TIMES A DAY    [provider]  folic acid (FOLVITE) 1 MG tablet Take 5 mg by mouth daily.  07/24/15   [provider]  Garlic TABS Take 2 tablets by mouth 2 (two) times daily. Kyolic    [provider]  glucosamine-chondroitin 500-400 MG tablet Take 1 tablet by mouth 2 (two) times daily.    [provider]  hydrocortisone valerate cream (WESTCORT) 0.2 % Apply topically 2 (two) times daily. Patient taking differently: Apply topically as needed.  06/12/17   Plotnikov, Evie Lacks, MD  LUTEIN PO Take by mouth.    [provider]  methotrexate (RHEUMATREX) 2.5 MG tablet TAKE 8 TABLETS BY MOUTH ONCE WEEKLY AS DIRECTED 08/09/15   [provider]  Multiple Vitamin (MULTIVITAMIN) tablet Take 1 tablet by mouth daily.      [provider]  Multiple Vitamins-Minerals (OCUVITE ADULT 50+) CAPS Take 1 capsule by mouth daily.    [provider]  prednisoLONE acetate (PREDNISOLONE ACETATE P-F) 1 % ophthalmic suspension Place 1 drop into both eyes 3 (three) times daily.    [provider]  saccharomyces boulardii (FLORASTOR) 250 MG capsule Take 1 capsule  (250 mg total) by mouth daily. 07/29/17   Gatha Mayer, MD  SIMBRINZA 1-0.2 % SUSP Place 1 drop into both eyes 3 (three) times daily. 07/29/15   [provider]  triamcinolone cream (KENALOG) 0.1 % Apply 1 application topically 3 (three) times daily as needed. 06/03/17   Plotnikov, Evie Lacks, MD    Family History Family History  Problem Relation Age of Onset  . Prostate cancer Father        died  . Pancreatic cancer Mother        died  . Diabetes Mother   . Hyperlipidemia Sister     Social History Social History   Tobacco Use  . Smoking status: Never Smoker  . Smokeless tobacco: Never Used  Substance Use Topics  . Alcohol use: No  . Drug use: No     Allergies   Patient has no known allergies.   Review of Systems Review of Systems  All other systems reviewed and are negative.    Physical Exam Triage Vital Signs ED Triage Vitals  Enc Vitals Group     BP 08/24/17 1022 (!) 148/81     Pulse Rate 08/24/17 1022 75     Resp 08/24/17 1022 18     Temp 08/24/17 1022 98.2 F (36.8 C)     Temp src --      SpO2 08/24/17 1022 100 %     Weight --      Height --      Pain Score 08/24/17 1018 0     Pain Loc --    Updated Vital Signs BP 132/75 (BP Location: Right Arm)   Pulse 62   Temp 97.9 F (36.6 C) (Oral)   Resp 16   SpO2 100%  Physical Exam  Constitutional: She is oriented to person, place, and time. No distress.  HENT:  Head: Atraumatic.  No hemotympanum, no nasal septal hematoma.  Tongue is midline.  Husband verifies the face is symmetric/consistent with previous.  Eyes: Pupils are equal, round, and reactive to light. EOM are normal.  Conjugate gaze observed, no eye redness/discharge Slight puffiness of the area surrounding the right lateral eyebrow, 1 inch clean laceration  Neck: Neck supple.  No midline posterior tenderness to percussion  Cardiovascular: Normal rate and regular rhythm.  Pulmonary/Chest: No stridor. No respiratory distress. She  has no wheezes.  Abdominal: She exhibits no distension.  Musculoskeletal: Normal range of motion.  Neurological: She is alert and oriented to  person, place, and time.  Skin: Skin is warm and dry.  Nursing note and vitals reviewed.    UC Treatments / Results  Labs (all labs ordered are listed, but only abnormal results are displayed) Labs Reviewed - No data to display  EKG None Radiology No results found.  Procedures Procedures (including critical care time) Laceration sutured by Jaynee Eagles PA; see procedure note.  Final Clinical Impressions(s) / UC Diagnoses   Final diagnoses:  Eyebrow laceration, right, initial encounter  Minor head injury, initial encounter   Wash gently with soap/water 1-2 times daily, apply antibiotic ointment and bandage.  Recheck if any increasing redness/swelling/pain/drainage or new fever>100.5.  Plan for wound check/stitches out in 6 days.  Due to the mild head injury (bumped head), please have any new/persistent headache checked out with your primary care provider or in the ER (if severe).      Wynona Luna, MD 09/03/17 2105

## 2017-08-28 DIAGNOSIS — H35351 Cystoid macular degeneration, right eye: Secondary | ICD-10-CM | POA: Insufficient documentation

## 2017-08-30 ENCOUNTER — Ambulatory Visit (HOSPITAL_COMMUNITY)
Admission: EM | Admit: 2017-08-30 | Discharge: 2017-08-30 | Disposition: A | Discharge status: Home / Self Care | Payer: Medicare Other

## 2017-08-30 DIAGNOSIS — Z4802 Encounter for removal of sutures: Secondary | ICD-10-CM

## 2017-08-30 NOTE — ED Notes (Signed)
Pt here for 5 sutures removed above R eyebrow. Wound is well healed. Sutures removed, pt has no further questions.

## 2017-09-02 ENCOUNTER — Ambulatory Visit: Payer: Medicare Other | Admitting: Internal Medicine

## 2017-09-02 ENCOUNTER — Telehealth (HOSPITAL_COMMUNITY): Payer: Self-pay

## 2017-09-02 ENCOUNTER — Encounter: Payer: Self-pay | Admitting: Internal Medicine

## 2017-09-02 ENCOUNTER — Encounter

## 2017-09-02 ENCOUNTER — Other Ambulatory Visit (INDEPENDENT_AMBULATORY_CARE_PROVIDER_SITE_OTHER): Payer: Medicare Other

## 2017-09-02 VITALS — BP 126/74 | HR 75 | Temp 98.9°F | Ht 67.0 in | Wt 133.0 lb

## 2017-09-02 DIAGNOSIS — A0472 Enterocolitis due to Clostridium difficile, not specified as recurrent: Secondary | ICD-10-CM

## 2017-09-02 DIAGNOSIS — S92535A Nondisplaced fracture of distal phalanx of left lesser toe(s), initial encounter for closed fracture: Secondary | ICD-10-CM

## 2017-09-02 DIAGNOSIS — H209 Unspecified iridocyclitis: Secondary | ICD-10-CM

## 2017-09-02 LAB — CBC WITH DIFFERENTIAL/PLATELET
BASOS ABS: 0 10*3/uL (ref 0.0–0.1)
Basophils Relative: 0.9 % (ref 0.0–3.0)
EOS PCT: 1.2 % (ref 0.0–5.0)
Eosinophils Absolute: 0.1 10*3/uL (ref 0.0–0.7)
HCT: 38.9 % (ref 36.0–46.0)
HEMOGLOBIN: 12.9 g/dL (ref 12.0–15.0)
LYMPHS PCT: 23.4 % (ref 12.0–46.0)
Lymphs Abs: 1 10*3/uL (ref 0.7–4.0)
MCHC: 33.2 g/dL (ref 30.0–36.0)
MCV: 94.1 fl (ref 78.0–100.0)
MONOS PCT: 7.7 % (ref 3.0–12.0)
Monocytes Absolute: 0.3 10*3/uL (ref 0.1–1.0)
Neutro Abs: 2.8 10*3/uL (ref 1.4–7.7)
Neutrophils Relative %: 66.8 % (ref 43.0–77.0)
Platelets: 284 10*3/uL (ref 150.0–400.0)
RBC: 4.14 Mil/uL (ref 3.87–5.11)
RDW: 16.8 % — ABNORMAL HIGH (ref 11.5–15.5)
WBC: 4.1 10*3/uL (ref 4.0–10.5)

## 2017-09-02 LAB — BASIC METABOLIC PANEL
BUN: 18 mg/dL (ref 6–23)
CALCIUM: 9.5 mg/dL (ref 8.4–10.5)
CO2: 29 mEq/L (ref 19–32)
Chloride: 107 mEq/L (ref 96–112)
Creatinine, Ser: 0.83 mg/dL (ref 0.40–1.20)
GFR: 86.66 mL/min (ref >60.00)
Glucose, Bld: 81 mg/dL (ref 70–99)
POTASSIUM: 3.8 meq/L (ref 3.5–5.1)
SODIUM: 142 meq/L (ref 135–145)

## 2017-09-02 LAB — HEPATIC FUNCTION PANEL
ALBUMIN: 4.4 g/dL (ref 3.5–5.2)
ALT: 22 U/L (ref 0–35)
AST: 23 U/L (ref 0–37)
Alkaline Phosphatase: 65 U/L (ref 39–117)
BILIRUBIN TOTAL: 0.6 mg/dL (ref 0.2–1.2)
Bilirubin, Direct: 0.1 mg/dL (ref 0.0–0.3)
TOTAL PROTEIN: 7.3 g/dL (ref 6.0–8.3)

## 2017-09-02 NOTE — Telephone Encounter (Signed)
Returned patients call. No answer at this time. Voicemail left.

## 2017-09-02 NOTE — Assessment & Plan Note (Signed)
Healed

## 2017-09-02 NOTE — Progress Notes (Signed)
Subjective:  Patient ID: Wendy Serrano, female    DOB: [birth year]  Age: 72 y.o. MRN: [id number]  CC: No chief complaint on file.   HPI Wendy Serrano presents for R eyebrow laceration  F/u L foot pain C/o wt loss    Outpatient Medications Prior to Visit  Medication Sig Dispense Refill  . calcium-vitamin D (OSCAL WITH D) 500-200 MG-UNIT per tablet Take 1 tablet by mouth 2 (two) times daily.      . diclofenac sodium (VOLTAREN) 1 % GEL diclofenac 1 % topical gel  APPLY 2 GRAMS TO AFFECTED AREA(S) 4 TIMES A DAY    . folic acid (FOLVITE) 1 MG tablet Take 5 mg by mouth daily.     . Garlic TABS Take 2 tablets by mouth 2 (two) times daily. Kyolic    . glucosamine-chondroitin 500-400 MG tablet Take 1 tablet by mouth 2 (two) times daily.    . hydrocortisone valerate cream (WESTCORT) 0.2 % Apply topically 2 (two) times daily. (Patient taking differently: Apply topically as needed. ) 45 g 2  . LUTEIN PO Take by mouth.    . methotrexate (RHEUMATREX) 2.5 MG tablet TAKE 8 TABLETS BY MOUTH ONCE WEEKLY AS DIRECTED  3  . Multiple Vitamin (MULTIVITAMIN) tablet Take 1 tablet by mouth daily.      . Multiple Vitamins-Minerals (OCUVITE ADULT 50+) CAPS Take 1 capsule by mouth daily.    . prednisoLONE acetate (PREDNISOLONE ACETATE P-F) 1 % ophthalmic suspension Place 1 drop into both eyes 3 (three) times daily.    [name] saccharomyces boulardii (FLORASTOR) 250 MG capsule Take 1 capsule (250 mg total) by mouth daily. 30 capsule 1  . SIMBRINZA 1-0.2 % SUSP Place 1 drop into both eyes 3 (three) times daily.  6  . triamcinolone cream (KENALOG) 0.1 % Apply 1 application topically 3 (three) times daily as needed. 450 g 3   No facility-administered medications prior to visit.     ROS Review of Systems  Constitutional: Negative for activity change, appetite change, chills, fatigue and unexpected weight change.  HENT: Negative for congestion, mouth sores and sinus pressure.   Eyes: Negative for visual  disturbance.  Respiratory: Negative for cough and chest tightness.   Gastrointestinal: Negative for abdominal pain and nausea.  Genitourinary: Negative for difficulty urinating, frequency and vaginal pain.  Musculoskeletal: Negative for back pain and gait problem.  Skin: Negative for pallor and rash.  Neurological: Negative for dizziness, tremors, weakness, numbness and headaches.  Psychiatric/Behavioral: Negative for confusion, sleep disturbance and suicidal ideas.    Objective:  BP 126/74 (BP Location: Left Arm, Patient Position: Sitting, Cuff Size: Normal)   Pulse 75   Temp 98.9 F (37.2 C) (Oral)   Ht 5\' 7"  (1.702 m)   Wt 133 lb (60.3 kg)   SpO2 99%   BMI 20.83 kg/m   BP Readings from Last 3 Encounters:  09/02/17 126/74  08/24/17 132/75  07/29/17 110/70    Wt Readings from Last 3 Encounters:  09/02/17 133 lb (60.3 kg)  07/29/17 129 lb 9.6 oz (58.8 kg)  07/15/17 126 lb 1.9 oz (57.2 kg)    Physical Exam  Constitutional: She appears well-developed. No distress.  HENT:  Head: Normocephalic.  Right Ear: External ear normal.  Left Ear: External ear normal.  Nose: Nose normal.  Mouth/Throat: Oropharynx is clear and moist.  Eyes: Pupils are equal, round, and reactive to light. Conjunctivae are normal. Right eye exhibits no discharge. Left eye exhibits no discharge.  Neck: Normal range  of motion. Neck supple. No JVD present. No tracheal deviation present. No thyromegaly present.  Cardiovascular: Normal rate, regular rhythm and normal heart sounds.  Pulmonary/Chest: No stridor. No respiratory distress. She has no wheezes.  Abdominal: Soft. Bowel sounds are normal. She exhibits no distension and no mass. There is no tenderness. There is no rebound and no guarding.  Musculoskeletal: She exhibits no edema or tenderness.  Lymphadenopathy:    She has no cervical adenopathy.  Neurological: She displays normal reflexes. No cranial nerve deficit. She exhibits normal muscle tone.  Coordination normal.  Skin: No rash noted. No erythema.  Psychiatric: She has a normal mood and affect. Her behavior is normal. Judgment and thought content normal.   R eyebrow laceration is healing L foot is NT  Lab Results  Component Value Date   WBC 7.7 07/15/2017   HGB 12.4 07/15/2017   HCT 36.7 07/15/2017   PLT 322.0 07/15/2017   GLUCOSE 91 07/21/2017   CHOL 178 06/12/2017   TRIG 80.0 06/12/2017   HDL 74.60 06/12/2017   LDLDIRECT 127.9 03/09/2013   LDLCALC 87 06/12/2017   ALT 18 07/21/2017   AST 16 07/21/2017   NA 139 07/21/2017   K 3.7 07/21/2017   CL 102 07/21/2017   CREATININE 0.82 07/21/2017   BUN 11 07/21/2017   CO2 30 07/21/2017   TSH 3.43 06/12/2017   INR 1.1 (H) 05/16/2015   HGBA1C 5.4 02/26/2011    No results found.  Assessment & Plan:   There are no diagnoses linked to this encounter. I am having Sullivan Lone maintain her multivitamin, calcium-vitamin D, Garlic, glucosamine-chondroitin, SIMBRINZA, folic acid, methotrexate, OCUVITE ADULT 50+, triamcinolone cream, hydrocortisone valerate cream, prednisoLONE acetate, diclofenac sodium, LUTEIN PO, and saccharomyces boulardii.  No orders of the defined types were placed in this encounter.    Follow-up: No follow-ups on file.  Walker Kehr, MD

## 2017-09-02 NOTE — Patient Instructions (Signed)
[place]

## 2017-09-02 NOTE — Assessment & Plan Note (Signed)
Dr Tomi Likens at [facility] On MTX and folic acid Labs

## 2017-09-02 NOTE — Assessment & Plan Note (Addendum)
No relapse Probiotics prn

## 2017-11-18 ENCOUNTER — Ambulatory Visit: Payer: Self-pay

## 2017-11-18 NOTE — Telephone Encounter (Signed)
Patient called in with c/o "dizziness." She says "it happened last night when I was putting in my eye drops, the room was spinning. I felt off balance. Today I still feel off balance and my left ear is ringing, not much dizziness, but I feel something is off." I asked about walking, she says "I am walking around my bed fine, but I can tell something is not quite right." I asked about other symptoms, she says "just the ringing in my ear." According to protocol, see PCP within 3 days, patient says she wants to see Dr. Alain Marion. I called Almyra Free, [facility] to ask if the Same Day slots for tomorrow can be opened to schedule the patient, Almyra Free scheduled the patient for tomorrow at 65 with Dr. Shellee Milo. Patient advised of the appointment, care advice given, patient verbalized understanding. She asks if another provider is available today, no availability with any provider in the practice, she says she will just keep the appointment tomorrow.   Reason for Disposition . [1] MILD dizziness (e.g., walking normally) AND [2] has NOT been evaluated by physician for this  (Exception: dizziness caused by heat exposure, sudden standing, or poor fluid intake)  Answer Assessment - Initial Assessment Questions 1. DESCRIPTION: "Describe your dizziness."     Lightheaded, balance off 2. LIGHTHEADED: "Do you feel lightheaded?" (e.g., somewhat faint, woozy, weak upon standing)     Not faint 3. VERTIGO: "Do you feel like either you or the room is spinning or tilting?" (i.e. vertigo)     Yes 4. SEVERITY: "How bad is it?"  "Do you feel like you are going to faint?" "Can you stand and walk?"   - MILD - walking normally   - MODERATE - interferes with normal activities (e.g., work, school)    - SEVERE - unable to stand, requires support to walk, feels like passing out now.      Mild 5. ONSET:  "When did the dizziness begin?"     Last night 6. AGGRAVATING FACTORS: "Does anything make it worse?" (e.g., standing, change in head  position)     I haven't noted too much of a difference 7. HEART RATE: "Can you tell me your heart rate?" "How many beats in 15 seconds?"  (Note: not all patients can do this)       No, it doesn't feel fast 8. CAUSE: "What do you think is causing the dizziness?"     I don't know 9. RECURRENT SYMPTOM: "Have you had dizziness before?" If so, ask: "When was the last time?" "What happened that time?"     A long time ago 10. OTHER SYMPTOMS: "Do you have any other symptoms?" (e.g., fever, chest pain, vomiting, diarrhea, bleeding)       Ringing in left ear 11. PREGNANCY: "Is there any chance you are pregnant?" "When was your last menstrual period?"       No  Protocols used: DIZZINESS [facility]

## 2017-11-19 ENCOUNTER — Ambulatory Visit: Payer: Medicare Other | Admitting: Internal Medicine

## 2017-11-19 ENCOUNTER — Encounter: Payer: Self-pay | Admitting: Internal Medicine

## 2017-11-19 DIAGNOSIS — R42 Dizziness and giddiness: Secondary | ICD-10-CM | POA: Insufficient documentation

## 2017-11-19 MED ORDER — MECLIZINE HCL 12.5 MG PO TABS: 12.5000 mg | ORAL_TABLET | Freq: Three times a day (TID) | ORAL | 1 refills | Status: DC | PRN

## 2017-11-19 NOTE — Assessment & Plan Note (Signed)
BPV 

## 2017-11-19 NOTE — Progress Notes (Signed)
Subjective:  Patient ID: Wendy Serrano, female    DOB: 1945-09-06  Age: 72 y.o. MRN: [id number]  CC: No chief complaint on file.   HPI Britney Newstrom presents for severe dizziness and spinning episode when was putting eyedrops in the eyes on Tue night (2 am). She felt dizzy in am. Turning to the L would trigger   Outpatient Medications Prior to Visit  Medication Sig Dispense Refill  . calcium-vitamin D (OSCAL WITH D) 500-200 MG-UNIT per tablet Take 1 tablet by mouth 2 (two) times daily.      . diclofenac sodium (VOLTAREN) 1 % GEL diclofenac 1 % topical gel  APPLY 2 GRAMS TO AFFECTED AREA(S) 4 TIMES A DAY    . folic acid (FOLVITE) 1 MG tablet Take 5 mg by mouth daily.     . Garlic TABS Take 2 tablets by mouth 2 (two) times daily. Kyolic    . glucosamine-chondroitin 500-400 MG tablet Take 1 tablet by mouth 2 (two) times daily.    . hydrocortisone valerate cream (WESTCORT) 0.2 % Apply topically 2 (two) times daily. (Patient taking differently: Apply topically as needed. ) 45 g 2  . LUTEIN PO Take by mouth.    . methotrexate (RHEUMATREX) 2.5 MG tablet TAKE 8 TABLETS BY MOUTH ONCE WEEKLY AS DIRECTED  3  . Multiple Vitamin (MULTIVITAMIN) tablet Take 1 tablet by mouth daily.      . Multiple Vitamins-Minerals (OCUVITE ADULT 50+) CAPS Take 1 capsule by mouth daily.    . prednisoLONE acetate (PREDNISOLONE ACETATE P-F) 1 % ophthalmic suspension Place 1 drop into both eyes 3 (three) times daily.    [name] saccharomyces boulardii (FLORASTOR) 250 MG capsule Take 1 capsule (250 mg total) by mouth daily. 30 capsule 1  . SIMBRINZA 1-0.2 % SUSP Place 1 drop into both eyes 3 (three) times daily.  6  . triamcinolone cream (KENALOG) 0.1 % Apply 1 application topically 3 (three) times daily as needed. 450 g 3   No facility-administered medications prior to visit.     ROS: Review of Systems  Constitutional: Negative for activity change, appetite change, chills, fatigue and unexpected weight change.  HENT:  Negative for congestion, mouth sores and sinus pressure.   Eyes: Negative for visual disturbance.  Respiratory: Negative for cough and chest tightness.   Gastrointestinal: Negative for abdominal pain and nausea.  Genitourinary: Negative for difficulty urinating, frequency and vaginal pain.  Musculoskeletal: Negative for back pain and gait problem.  Skin: Negative for pallor and rash.  Neurological: Positive for dizziness. Negative for tremors, weakness, numbness and headaches.  Psychiatric/Behavioral: Negative for confusion, sleep disturbance and suicidal ideas.    Objective:  BP 120/72 (BP Location: Left Arm, Patient Position: Sitting, Cuff Size: Normal)   Pulse 69   Temp 98 F (36.7 C) (Oral)   Ht 5\' 7"  (1.702 m)   Wt 135 lb (61.2 kg)   SpO2 99%   BMI 21.14 kg/m   BP Readings from Last 3 Encounters:  11/19/17 120/72  09/02/17 126/74  08/24/17 132/75    Wt Readings from Last 3 Encounters:  11/19/17 135 lb (61.2 kg)  09/02/17 133 lb (60.3 kg)  07/29/17 129 lb 9.6 oz (58.8 kg)    Physical Exam  Constitutional: She appears well-developed. No distress.  HENT:  Head: Normocephalic.  Right Ear: External ear normal.  Left Ear: External ear normal.  Nose: Nose normal.  Mouth/Throat: Oropharynx is clear and moist.  Eyes: Pupils are equal, round, and reactive to light. Conjunctivae  are normal. Right eye exhibits no discharge. Left eye exhibits no discharge.  Neck: Normal range of motion. Neck supple. No JVD present. No tracheal deviation present. No thyromegaly present.  Cardiovascular: Normal rate, regular rhythm and normal heart sounds.  Pulmonary/Chest: No stridor. No respiratory distress. She has no wheezes.  Abdominal: Soft. Bowel sounds are normal. She exhibits no distension and no mass. There is no tenderness. There is no rebound and no guarding.  Musculoskeletal: She exhibits no edema or tenderness.  Lymphadenopathy:    She has no cervical adenopathy.  Neurological:  She displays normal reflexes. No cranial nerve deficit. She exhibits normal muscle tone. Coordination normal.  Skin: No rash noted. No erythema.  Psychiatric: She has a normal mood and affect. Her behavior is normal. Judgment and thought content normal.   H-P (-) B  FTF>20 min discussing BPVetiology and treatment    Lab Results  Component Value Date   WBC 4.1 09/02/2017   HGB 12.9 09/02/2017   HCT 38.9 09/02/2017   PLT 284.0 09/02/2017   GLUCOSE 81 09/02/2017   CHOL 178 06/12/2017   TRIG 80.0 06/12/2017   HDL 74.60 06/12/2017   LDLDIRECT 127.9 03/09/2013   LDLCALC 87 06/12/2017   ALT 22 09/02/2017   AST 23 09/02/2017   NA 142 09/02/2017   K 3.8 09/02/2017   CL 107 09/02/2017   CREATININE 0.83 09/02/2017   BUN 18 09/02/2017   CO2 29 09/02/2017   TSH 3.43 06/12/2017   INR 1.1 (H) 05/16/2015   HGBA1C 5.4 02/26/2011    No results found.  Assessment & Plan:   There are no diagnoses linked to this encounter.   No orders of the defined types were placed in this encounter.    Follow-up: No follow-ups on file.  Walker Kehr, MD

## 2017-12-08 ENCOUNTER — Ambulatory Visit (INDEPENDENT_AMBULATORY_CARE_PROVIDER_SITE_OTHER): Payer: Medicare Other | Admitting: Internal Medicine

## 2017-12-08 ENCOUNTER — Encounter: Payer: Self-pay | Admitting: Internal Medicine

## 2017-12-08 DIAGNOSIS — R42 Dizziness and giddiness: Secondary | ICD-10-CM

## 2017-12-08 DIAGNOSIS — H9312 Tinnitus, left ear: Secondary | ICD-10-CM

## 2017-12-08 NOTE — Progress Notes (Signed)
Subjective:  Patient ID: Wendy Serrano, female    DOB: 1945-08-10  Age: 72 y.o. MRN: [id number]  CC: No chief complaint on file.   HPI Wendy Serrano presents for vertigo -better, however still having a noise sensation in the L ear x 2 weeks  Outpatient Medications Prior to Visit  Medication Sig Dispense Refill  . calcium-vitamin D (OSCAL WITH D) 500-200 MG-UNIT per tablet Take 1 tablet by mouth 2 (two) times daily.      . diclofenac sodium (VOLTAREN) 1 % GEL diclofenac 1 % topical gel  APPLY 2 GRAMS TO AFFECTED AREA(S) 4 TIMES A DAY    . folic acid (FOLVITE) 1 MG tablet Take 5 mg by mouth daily.     . Garlic TABS Take 2 tablets by mouth 2 (two) times daily. Kyolic    . glucosamine-chondroitin 500-400 MG tablet Take 1 tablet by mouth 2 (two) times daily.    . hydrocortisone valerate cream (WESTCORT) 0.2 % Apply topically 2 (two) times daily. (Patient taking differently: Apply topically as needed. ) 45 g 2  . LUTEIN PO Take by mouth.    . meclizine (ANTIVERT) 12.5 MG tablet Take 1 tablet (12.5 mg total) by mouth 3 (three) times daily as needed for dizziness. 60 tablet 1  . methotrexate (RHEUMATREX) 2.5 MG tablet TAKE 8 TABLETS BY MOUTH ONCE WEEKLY AS DIRECTED  3  . Multiple Vitamin (MULTIVITAMIN) tablet Take 1 tablet by mouth daily.      . Multiple Vitamins-Minerals (OCUVITE ADULT 50+) CAPS Take 1 capsule by mouth daily.    . prednisoLONE acetate (PREDNISOLONE ACETATE P-F) 1 % ophthalmic suspension Place 1 drop into both eyes 3 (three) times daily.    [name] saccharomyces boulardii (FLORASTOR) 250 MG capsule Take 1 capsule (250 mg total) by mouth daily. 30 capsule 1  . SIMBRINZA 1-0.2 % SUSP Place 1 drop into both eyes 3 (three) times daily.  6  . triamcinolone cream (KENALOG) 0.1 % Apply 1 application topically 3 (three) times daily as needed. 450 g 3   No facility-administered medications prior to visit.     ROS: Review of Systems  Constitutional: Negative for activity change,  appetite change, chills, fatigue and unexpected weight change.  HENT: Positive for tinnitus. Negative for congestion, mouth sores and sinus pressure.   Eyes: Negative for visual disturbance.  Respiratory: Negative for cough and chest tightness.   Gastrointestinal: Negative for abdominal pain and nausea.  Genitourinary: Negative for difficulty urinating, frequency and vaginal pain.  Musculoskeletal: Negative for back pain and gait problem.  Skin: Negative for pallor and rash.  Neurological: Positive for dizziness. Negative for tremors, weakness, numbness and headaches.  Psychiatric/Behavioral: Negative for confusion and sleep disturbance.    Objective:  BP 118/72 (BP Location: Left Arm, Patient Position: Sitting, Cuff Size: Normal)   Pulse 71   Temp 98.4 F (36.9 C) (Oral)   Ht 5\' 7"  (1.702 m)   Wt 134 lb (60.8 kg)   SpO2 98%   BMI 20.99 kg/m   BP Readings from Last 3 Encounters:  12/08/17 118/72  11/19/17 120/72  09/02/17 126/74    Wt Readings from Last 3 Encounters:  12/08/17 134 lb (60.8 kg)  11/19/17 135 lb (61.2 kg)  09/02/17 133 lb (60.3 kg)    Physical Exam  Constitutional: She appears well-developed. No distress.  HENT:  Head: Normocephalic.  Right Ear: External ear normal.  Left Ear: External ear normal.  Nose: Nose normal.  Mouth/Throat: Oropharynx is clear and moist.  Eyes: Pupils are equal, round, and reactive to light. Conjunctivae are normal. Right eye exhibits no discharge. Left eye exhibits no discharge.  Neck: Normal range of motion. Neck supple. No JVD present. No tracheal deviation present. No thyromegaly present.  Cardiovascular: Normal rate, regular rhythm and normal heart sounds.  Pulmonary/Chest: No stridor. No respiratory distress. She has no wheezes.  Abdominal: Soft. Bowel sounds are normal. She exhibits no distension and no mass. There is no tenderness. There is no rebound and no guarding.  Musculoskeletal: She exhibits no edema or tenderness.   Lymphadenopathy:    She has no cervical adenopathy.  Neurological: She displays normal reflexes. No cranial nerve deficit. She exhibits normal muscle tone. Coordination normal.  Skin: No rash noted. No erythema.  Psychiatric: She has a normal mood and affect. Her behavior is normal. Judgment and thought content normal.    Lab Results  Component Value Date   WBC 4.1 09/02/2017   HGB 12.9 09/02/2017   HCT 38.9 09/02/2017   PLT 284.0 09/02/2017   GLUCOSE 81 09/02/2017   CHOL 178 06/12/2017   TRIG 80.0 06/12/2017   HDL 74.60 06/12/2017   LDLDIRECT 127.9 03/09/2013   LDLCALC 87 06/12/2017   ALT 22 09/02/2017   AST 23 09/02/2017   NA 142 09/02/2017   K 3.8 09/02/2017   CL 107 09/02/2017   CREATININE 0.83 09/02/2017   BUN 18 09/02/2017   CO2 29 09/02/2017   TSH 3.43 06/12/2017   INR 1.1 (H) 05/16/2015   HGBA1C 5.4 02/26/2011    No results found.  Assessment & Plan:   There are no diagnoses linked to this encounter.   No orders of the defined types were placed in this encounter.    Follow-up: No follow-ups on file.  Walker Kehr, MD

## 2017-12-08 NOTE — Assessment & Plan Note (Signed)
ENT ref 

## 2017-12-08 NOTE — Assessment & Plan Note (Signed)
Better - ENT ref

## 2017-12-11 ENCOUNTER — Ambulatory Visit: Payer: Medicare Other | Admitting: Internal Medicine

## 2018-01-04 DIAGNOSIS — H903 Sensorineural hearing loss, bilateral: Secondary | ICD-10-CM | POA: Insufficient documentation

## 2018-01-06 ENCOUNTER — Other Ambulatory Visit: Payer: Self-pay | Admitting: Otolaryngology

## 2018-01-06 DIAGNOSIS — H9313 Tinnitus, bilateral: Secondary | ICD-10-CM

## 2018-01-13 ENCOUNTER — Ambulatory Visit
Admission: RE | Admit: 2018-01-13 | Discharge: 2018-01-13 | Disposition: A | Discharge status: Home / Self Care | Payer: Medicare Other | Source: Ambulatory Visit | Attending: Otolaryngology | Admitting: Otolaryngology

## 2018-01-13 DIAGNOSIS — H9313 Tinnitus, bilateral: Secondary | ICD-10-CM

## 2018-01-13 MED ORDER — GADOBENATE DIMEGLUMINE 529 MG/ML IV SOLN
12.0000 mL | Freq: Once | INTRAVENOUS | Status: AC | PRN
  Administered 2018-01-13: 12 mL via INTRAVENOUS

## 2018-02-08 ENCOUNTER — Other Ambulatory Visit: Payer: Self-pay | Admitting: Internal Medicine

## 2018-02-08 DIAGNOSIS — Z1231 Encounter for screening mammogram for malignant neoplasm of breast: Secondary | ICD-10-CM

## 2018-02-23 ENCOUNTER — Telehealth: Payer: Self-pay | Admitting: *Deleted

## 2018-02-23 NOTE — Telephone Encounter (Signed)
Called to offer appt today at 4pm with Dr. Jannifer [place]. She declined stating she could not make appt. Advised I will call back if any other openings occur. She verbalized understanding and appreciation.

## 2018-03-15 ENCOUNTER — Ambulatory Visit
Admission: RE | Admit: 2018-03-15 | Discharge: 2018-03-15 | Disposition: A | Discharge status: Home / Self Care | Payer: Medicare Other | Source: Ambulatory Visit | Attending: Internal Medicine | Admitting: Internal Medicine

## 2018-03-15 DIAGNOSIS — Z1231 Encounter for screening mammogram for malignant neoplasm of breast: Secondary | ICD-10-CM

## 2018-03-29 ENCOUNTER — Encounter

## 2018-03-29 ENCOUNTER — Encounter: Payer: Self-pay | Admitting: Neurology

## 2018-03-29 ENCOUNTER — Ambulatory Visit: Payer: Medicare Other | Admitting: Neurology

## 2018-03-29 ENCOUNTER — Other Ambulatory Visit: Payer: Self-pay

## 2018-03-29 VITALS — BP 134/81 | HR 84 | Resp 18 | Ht 67.0 in | Wt 137.0 lb

## 2018-03-29 DIAGNOSIS — I679 Cerebrovascular disease, unspecified: Secondary | ICD-10-CM

## 2018-03-29 DIAGNOSIS — R9089 Other abnormal findings on diagnostic imaging of central nervous system: Secondary | ICD-10-CM | POA: Diagnosis not present

## 2018-03-29 DIAGNOSIS — E538 Deficiency of other specified B group vitamins: Secondary | ICD-10-CM | POA: Diagnosis not present

## 2018-03-29 DIAGNOSIS — R413 Other amnesia: Secondary | ICD-10-CM

## 2018-03-29 DIAGNOSIS — H8112 Benign paroxysmal vertigo, left ear: Secondary | ICD-10-CM

## 2018-03-29 NOTE — Progress Notes (Signed)
Reason for visit: Abnormal MRI brain, mild memory disturbance  Referring physician: Dr. Oneta Rack is a 72 y.o. female  History of present illness:  Ms. Ottey is a 72 year old black female with a history of intermittent vertigo.  The patient had MRI of the brain done in 2011 for vertigo and gait instability.  The patient was noted to have a moderate level small vessel ischemic changes at that time.  The patient had a similar event of vertigo lasting 7 to 10 days in September 2019.  The patient was seen by Dr. Janace Hoard.  MRI of the brain was done once again showing a moderate level small vessel ischemic changes.  The report suggests some progression of the white matter change since 2011, by my review, there is a limited amount of progression on the most recent scan.  The patient herself has no history of significant hypertension or diabetes.  She is not on low-dose aspirin.  MRA of the head done in 2011 was unremarkable.  The patient reports some left-sided tinnitus, the vertigo is gone.  The patient reports no visual disturbances, double vision, loss of vision, or any numbness or weakness of the extremities.  She denies any significant balance issues or difficulty controlling the bowels or the bladder.  She has noted a mild change in memory over time.  She is sent to this office for further evaluation.  She does not have a history of headache.  Past Medical History:  Diagnosis Date  . Allergic rhinitis   . 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 C. difficile diarrhea 06/2017  . Eczema    childhood  . H/O osteopenia 01/09/04  . H/O osteoporosis 2008  . H/O varicella   . 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 History of measles, mumps, or rubella   . Leukopenia    chronic  . Liver hemangioma   . 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 Menopausal symptoms   . Renal cyst    bilateral  . Rotator cuff tear   . Yeast infection     Past  Surgical History:  Procedure Laterality Date  . ROTATOR CUFF REPAIR Left 08/2009   Left shoulder-rotator cuff repair [facility])  . WISDOM TOOTH EXTRACTION      Family History  Problem Relation Age of Onset  . Prostate cancer Father        died  . Pancreatic cancer Mother        died  . Diabetes Mother   . Hyperlipidemia Sister     Social history:  reports that she has never smoked. She has never used smokeless tobacco. She reports that she does not drink alcohol or use drugs.  Medications:  Prior to Admission medications   Medication Sig Start Date End Date Taking? Authorizing Provider  calcium-vitamin D (OSCAL WITH D) 500-200 MG-UNIT per tablet Take 1 tablet by mouth 2 (two) times daily.      [provider]  diclofenac sodium (VOLTAREN) 1 % GEL diclofenac 1 % topical gel  APPLY 2 GRAMS TO AFFECTED AREA(S) 4 TIMES A DAY    [provider]  folic acid (FOLVITE) 1 MG tablet Take 5 mg by mouth daily.  07/24/15   [provider]  Garlic TABS Take 2 tablets by mouth 2 (two) times daily. Kyolic    [provider]  glucosamine-chondroitin 500-400 MG tablet Take 1 tablet by mouth 2 (two)  times daily.    [provider]  hydrocortisone valerate cream (WESTCORT) 0.2 % Apply topically 2 (two) times daily. Patient taking differently: Apply topically as needed.  06/12/17   Plotnikov, Evie Lacks, MD  LUTEIN PO Take by mouth.    [provider]  meclizine (ANTIVERT) 12.5 MG tablet Take 1 tablet (12.5 mg total) by mouth 3 (three) times daily as needed for dizziness. 11/19/17 11/19/18  Plotnikov, Evie Lacks, MD  methotrexate (RHEUMATREX) 2.5 MG tablet TAKE 8 TABLETS BY MOUTH ONCE WEEKLY AS DIRECTED 08/09/15   [provider]  Multiple Vitamin (MULTIVITAMIN) tablet Take 1 tablet by mouth daily.      [provider]  Multiple Vitamins-Minerals (OCUVITE ADULT 50+) CAPS Take 1 capsule by mouth daily.    [provider]  prednisoLONE  acetate (PREDNISOLONE ACETATE P-F) 1 % ophthalmic suspension Place 1 drop into both eyes 3 (three) times daily.    [provider]  saccharomyces boulardii (FLORASTOR) 250 MG capsule Take 1 capsule (250 mg total) by mouth daily. 07/29/17   Gatha Mayer, MD  SIMBRINZA 1-0.2 % SUSP Place 1 drop into both eyes 3 (three) times daily. 07/29/15   [provider]  triamcinolone cream (KENALOG) 0.1 % Apply 1 application topically 3 (three) times daily as needed. 06/03/17   Plotnikov, Evie Lacks, MD     No Known Allergies  ROS:  Out of a complete 14 system review of symptoms, the patient complains only of the following symptoms, and all other reviewed systems are negative.  Ringing in the ears, dizziness Birthmark, moles  Blood pressure 134/81, pulse 84, resp. rate 18, height 5\' 7"  (1.702 m), weight 137 lb (62.1 kg).  Physical Exam  General: The patient is alert and cooperative at the time of the examination.  Eyes: Pupils are equal, round, and reactive to light. Discs are flat bilaterally.  Neck: The neck is supple, no carotid bruits are noted.  Respiratory: The respiratory examination is clear.  Cardiovascular: The cardiovascular examination reveals a regular rate and rhythm, no obvious murmurs or rubs are noted.  Skin: Extremities are without significant edema.  Neurologic Exam  Mental status: The patient is alert and oriented x 3 at the time of the examination. The Mini-Mental status examination done today shows a total score of 27/30.  Cranial nerves: Facial symmetry is present. There is good sensation of the face to pinprick and soft touch bilaterally. The strength of the facial muscles and the muscles to head turning and shoulder shrug are normal bilaterally. Speech is well enunciated, no aphasia or dysarthria is noted. Extraocular movements are full. Visual fields are full. The tongue is midline, and the patient has symmetric elevation of the soft palate. No obvious  hearing deficits are noted.  Motor: The motor testing reveals 5 over 5 strength of all 4 extremities. Good symmetric motor tone is noted throughout.  Sensory: Sensory testing is intact to pinprick, soft touch, vibration sensation, and position sense on all 4 extremities. No evidence of extinction is noted.  Coordination: Cerebellar testing reveals good finger-nose-finger and heel-to-shin bilaterally.  Gait and station: Gait is normal. Tandem gait is normal. Romberg is negative. No drift is seen.  Reflexes: Deep tendon reflexes are symmetric and normal bilaterally. Toes are downgoing bilaterally.   MRI brain 01/13/18:  IMPRESSION: Progression of chronic microvascular ischemic change in the white matter and cerebellum since 2011. New area of chronic microhemorrhage in the left cerebellum since 2011. This may be due to chronic  hypertension.  No acute abnormality.  * MRI scan images were reviewed online. I agree with the written report.   Assessment/Plan:  1.  Episodic vertigo, resolved  2.  Left-sided tinnitus  3.  Mild memory disturbance  4.  Abnormal MRI brain  The patient appears to have small vessel disease by MRI of the brain.  Upon review of the 2011 MRI brain scan, there has been very little change in the white matter lesions.  The patient will go on low-dose aspirin, a carotid Doppler study will be done.  Blood work will be done today.  She will follow-up through this office if needed.  If she believes that her memory issues are significantly changing, she will contact our office.   Jill Alexanders MD 03/29/2018 8:44 AM  [facility] [address] [place], [place] [address]  Phone [phone] Fax [phone]

## 2018-03-29 NOTE — Patient Instructions (Signed)
Start Aspirin 81 mg a day

## 2018-03-30 LAB — RPR: RPR Ser Ql: NONREACTIVE

## 2018-03-30 LAB — ANA W/REFLEX: ANA: NEGATIVE

## 2018-03-30 LAB — SEDIMENTATION RATE: SED RATE: 2 mm/h (ref 0–40)

## 2018-03-30 LAB — VITAMIN B12: Vitamin B-12: 1979 pg/mL — ABNORMAL HIGH (ref 232–1245)

## 2018-04-01 ENCOUNTER — Telehealth: Payer: Self-pay | Admitting: Neurology

## 2018-04-01 NOTE — Telephone Encounter (Signed)
Pts requesting a call stating she is unaware of why she is having a Carotid-Doppler. Also stating she doesn't remember Dr. Jannifer Franklin going over her MRI. Requesting a call to advise. Pt then stated  something regarding a vessel disease? But was unsure if that had anything related to the Doppler.

## 2018-04-01 NOTE — Telephone Encounter (Signed)
I will call the patient

## 2018-04-01 NOTE — Telephone Encounter (Signed)
I called pt and went over MRI results from 01/13/18 noted in Dr. Jannifer Franklin OV note. Also explained why he ordered Carotid doppler. Explained what this procedure was. Gave her address of where she needs to go for test. Also provided her phone number. Advised her to call back. She has appt with them on 04/06/18 at 10am. Nothing further needed.

## 2018-04-06 ENCOUNTER — Ambulatory Visit (HOSPITAL_COMMUNITY)
Admission: RE | Admit: 2018-04-06 | Discharge: 2018-04-06 | Disposition: A | Discharge status: Home / Self Care | Payer: Medicare Other | Source: Ambulatory Visit | Attending: Neurology | Admitting: Neurology

## 2018-04-06 ENCOUNTER — Telehealth: Payer: Self-pay | Admitting: Neurology

## 2018-04-06 DIAGNOSIS — I679 Cerebrovascular disease, unspecified: Secondary | ICD-10-CM | POA: Insufficient documentation

## 2018-04-06 DIAGNOSIS — H8112 Benign paroxysmal vertigo, left ear: Secondary | ICD-10-CM

## 2018-04-06 DIAGNOSIS — R9089 Other abnormal findings on diagnostic imaging of central nervous system: Secondary | ICD-10-CM | POA: Diagnosis present

## 2018-04-06 NOTE — Progress Notes (Signed)
Carotid artery duplex has been completed. 1-39% ICA stenosis bilaterally.  04/06/18 11:05 AM Wendy Serrano RVT

## 2018-04-06 NOTE — Telephone Encounter (Signed)
I called the patient. The carotid doppler is unremarkable.    Carotid doppler 04/06/18:  Summary: Right Carotid: Velocities in the right ICA are consistent with a 1-39% stenosis.  Left Carotid: Velocities in the left ICA are consistent with a 1-39% stenosis.  Vertebrals: Bilateral vertebral arteries demonstrate antegrade flow.

## 2018-06-07 ENCOUNTER — Encounter: Payer: Medicare Other | Admitting: Internal Medicine

## 2018-06-14 ENCOUNTER — Ambulatory Visit: Payer: Medicare Other | Admitting: Internal Medicine

## 2018-06-14 ENCOUNTER — Encounter: Payer: Self-pay | Admitting: Internal Medicine

## 2018-06-14 ENCOUNTER — Ambulatory Visit (INDEPENDENT_AMBULATORY_CARE_PROVIDER_SITE_OTHER): Payer: Medicare Other | Admitting: Internal Medicine

## 2018-06-14 ENCOUNTER — Other Ambulatory Visit (INDEPENDENT_AMBULATORY_CARE_PROVIDER_SITE_OTHER): Payer: Medicare Other

## 2018-06-14 VITALS — BP 126/82 | HR 71 | Temp 97.6°F | Ht 67.0 in | Wt 140.0 lb

## 2018-06-14 DIAGNOSIS — Z0000 Encounter for general adult medical examination without abnormal findings: Secondary | ICD-10-CM

## 2018-06-14 DIAGNOSIS — M810 Age-related osteoporosis without current pathological fracture: Secondary | ICD-10-CM

## 2018-06-14 DIAGNOSIS — L309 Dermatitis, unspecified: Secondary | ICD-10-CM

## 2018-06-14 DIAGNOSIS — Z23 Encounter for immunization: Secondary | ICD-10-CM | POA: Diagnosis not present

## 2018-06-14 DIAGNOSIS — H209 Unspecified iridocyclitis: Secondary | ICD-10-CM

## 2018-06-14 LAB — BASIC METABOLIC PANEL
BUN: 22 mg/dL (ref 6–23)
CALCIUM: 9.5 mg/dL (ref 8.4–10.5)
CO2: 26 mEq/L (ref 19–32)
CREATININE: 0.86 mg/dL (ref 0.40–1.20)
Chloride: 107 mEq/L (ref 96–112)
GFR: 78.09 mL/min (ref >60.00)
Glucose, Bld: 92 mg/dL (ref 70–99)
Potassium: 3.4 mEq/L — ABNORMAL LOW (ref 3.5–5.1)
SODIUM: 142 meq/L (ref 135–145)

## 2018-06-14 LAB — CBC WITH DIFFERENTIAL/PLATELET
BASOS PCT: 0.9 % (ref 0.0–3.0)
Basophils Absolute: 0 10*3/uL (ref 0.0–0.1)
EOS PCT: 2.3 % (ref 0.0–5.0)
Eosinophils Absolute: 0.1 10*3/uL (ref 0.0–0.7)
HEMATOCRIT: 38.2 % (ref 36.0–46.0)
Hemoglobin: 12.8 g/dL (ref 12.0–15.0)
LYMPHS ABS: 0.8 10*3/uL (ref 0.7–4.0)
LYMPHS PCT: 20.4 % (ref 12.0–46.0)
MCHC: 33.5 g/dL (ref 30.0–36.0)
MCV: 95.8 fl (ref 78.0–100.0)
MONOS PCT: 8.9 % (ref 3.0–12.0)
Monocytes Absolute: 0.3 10*3/uL (ref 0.1–1.0)
NEUTROS ABS: 2.6 10*3/uL (ref 1.4–7.7)
NEUTROS PCT: 67.5 % (ref 43.0–77.0)
PLATELETS: 255 10*3/uL (ref 150.0–400.0)
RBC: 3.98 Mil/uL (ref 3.87–5.11)
RDW: 14.5 % (ref 11.5–15.5)
WBC: 3.9 10*3/uL — ABNORMAL LOW (ref 4.0–10.5)

## 2018-06-14 LAB — URINALYSIS
BILIRUBIN URINE: NEGATIVE
Hgb urine dipstick: NEGATIVE
KETONES UR: NEGATIVE
LEUKOCYTES UA: NEGATIVE
Nitrite: NEGATIVE
PH: 5.5 (ref 5.0–8.0)
SPECIFIC GRAVITY, URINE: 1.025 (ref 1.000–1.030)
TOTAL PROTEIN, URINE-UPE24: NEGATIVE
Urine Glucose: NEGATIVE
Urobilinogen, UA: 0.2 (ref 0.0–1.0)

## 2018-06-14 LAB — LIPID PANEL
CHOL/HDL RATIO: 3
CHOLESTEROL: 223 mg/dL — AB (ref 0–200)
HDL: 88.4 mg/dL (ref >39.00)
LDL Cholesterol: 125 mg/dL — ABNORMAL HIGH (ref 0–99)
NonHDL: 134.46
TRIGLYCERIDES: 49 mg/dL (ref 0.0–149.0)
VLDL: 9.8 mg/dL (ref 0.0–40.0)

## 2018-06-14 LAB — HEPATIC FUNCTION PANEL
ALBUMIN: 4.4 g/dL (ref 3.5–5.2)
ALT: 26 U/L (ref 0–35)
AST: 28 U/L (ref 0–37)
Alkaline Phosphatase: 56 U/L (ref 39–117)
Bilirubin, Direct: 0.1 mg/dL (ref 0.0–0.3)
TOTAL PROTEIN: 6.9 g/dL (ref 6.0–8.3)
Total Bilirubin: 0.6 mg/dL (ref 0.2–1.2)

## 2018-06-14 LAB — TSH: TSH: 5.13 u[IU]/mL — ABNORMAL HIGH (ref 0.35–4.50)

## 2018-06-14 NOTE — Assessment & Plan Note (Signed)
  Here for medicare wellness/physical  Diet: heart healthy  Physical activity: not sedentary  Depression/mood screen: negative  Hearing: intact to whispered voice  Visual acuity: grossly normal - reading glasses, performs reg eye exam at [facility] - uveitis ADLs: capable  Fall risk: low to none  Home safety: good  Cognitive evaluation: intact to orientation, naming, recall and repetition  EOL planning: adv directives, full code/ I agree  I have personally reviewed and have noted  1. The patient's medical, surgical and social history  2. Their use of alcohol, tobacco or illicit drugs  3. Their current medications and supplements  4. The patient's functional ability including ADL's, fall risks, home safety risks and hearing or visual impairment.  5. Diet and physical activities  6. Evidence for depression or mood disorders 7. The roster of all physicians providing medical care to patient - is listed in the Snapshot section of the chart and reviewed today.    Today patient counseled on age appropriate routine health concerns for screening and prevention, each reviewed and up to date or declined. Immunizations reviewed and up to date or declined. Labs ordered and reviewed. Risk factors for depression reviewed and negative. Hearing function and visual acuity are intact. ADLs screened and addressed as needed. Functional ability and level of safety reviewed and appropriate. Education, counseling and referrals performed based on assessed risks today. Patient provided with a copy of personalized plan for preventive services.   colonsocopy 2014 cardiac CT scan for calcium scoring offered 2/20  shingles - previously declined

## 2018-06-14 NOTE — Addendum Note (Signed)
Addended by: Karren Cobble on: 06/14/2018 02:57 PM   Modules accepted: Orders

## 2018-06-14 NOTE — Assessment & Plan Note (Signed)
every winter  Triamcinolone cream SebaMed soap

## 2018-06-14 NOTE — Assessment & Plan Note (Signed)
Dr Tomi Likens at [facility]

## 2018-06-14 NOTE — Patient Instructions (Addendum)
Cardiac CT calcium scoring test $150   Computed tomography, more commonly known as a CT or CAT scan, is a diagnostic medical imaging test. Like traditional x-rays, it produces multiple images or pictures of the inside of the body. The cross-sectional images generated during a CT scan can be reformatted in multiple planes. They can even generate three-dimensional images. These images can be viewed on a computer monitor, printed on film or by a 3D printer, or transferred to a CD or DVD. CT images of internal organs, bones, soft tissue and blood vessels provide greater detail than traditional x-rays, particularly of soft tissues and blood vessels. A cardiac CT scan for coronary calcium is a non-invasive way of obtaining information about the presence, location and extent of calcified plaque in the coronary arteries-the vessels that supply oxygen-containing blood to the heart muscle. Calcified plaque results when there is a build-up of fat and other substances under the inner layer of the artery. This material can calcify which signals the presence of atherosclerosis, a disease of the vessel wall, also called coronary artery disease (CAD). People with this disease have an increased risk for heart attacks. In addition, over time, progression of plaque build up (CAD) can narrow the arteries or even close off blood flow to the heart. The result may be chest pain, sometimes called "angina," or a heart attack. Because calcium is a marker of CAD, the amount of calcium detected on a cardiac CT scan is a helpful prognostic tool. The findings on cardiac CT are expressed as a calcium score. Another name for this test is coronary artery calcium scoring.  What are some common uses of the procedure? The goal of cardiac CT scan for calcium scoring is to determine if CAD is present and to what extent, even if there are no symptoms. It is a screening study that may be recommended by a physician for patients with risk factors  for CAD but no clinical symptoms. The major risk factors for CAD are: . high blood cholesterol levels  . family history of heart attacks  . diabetes  . high blood pressure  . cigarette smoking  . overweight or obese  . physical inactivity   A negative cardiac CT scan for calcium scoring shows no calcification within the coronary arteries. This suggests that CAD is absent or so minimal it cannot be seen by this technique. The chance of having a heart attack over the next two to five years is very low under these circumstances. A positive test means that CAD is present, regardless of whether or not the patient is experiencing any symptoms. The amount of calcification-expressed as the calcium score-may help to predict the likelihood of a myocardial infarction (heart attack) in the coming years and helps your medical doctor or cardiologist decide whether the patient may need to take preventive medicine or undertake other measures such as diet and exercise to lower the risk for heart attack. The extent of CAD is graded according to your calcium score:  Calcium Score  Presence of CAD  0 No evidence of CAD   1-10 Minimal evidence of CAD  11-100 Mild evidence of CAD  101-400 Moderate evidence of CAD  Over 400 Extensive evidence of CAD   Triamcinolone cream SebaMed soap

## 2018-06-14 NOTE — Assessment & Plan Note (Signed)
Ca and Vit D 

## 2018-06-14 NOTE — Progress Notes (Signed)
Subjective:  Patient ID: Wendy Serrano, female    DOB: 12-02-1945  Age: 73 y.o. MRN: [id number]  CC: No chief complaint on file.   HPI Wendy Serrano presents for a well exam  Outpatient Medications Prior to Visit  Medication Sig Dispense Refill  . aspirin 81 MG tablet Take 81 mg by mouth daily.    . calcium-vitamin D (OSCAL WITH D) 500-200 MG-UNIT per tablet Take 1 tablet by mouth 2 (two) times daily.      . folic acid (FOLVITE) 1 MG tablet Take 5 mg by mouth daily.     . Garlic TABS Take 2 tablets by mouth 2 (two) times daily. Kyolic    . glucosamine-chondroitin 500-400 MG tablet Take 1 tablet by mouth 2 (two) times daily.    . hydrocortisone valerate cream (WESTCORT) 0.2 % Apply topically 2 (two) times daily. (Patient taking differently: Apply topically as needed. ) 45 g 2  . methotrexate (RHEUMATREX) 2.5 MG tablet TAKE 8 TABLETS BY MOUTH ONCE WEEKLY AS DIRECTED  3  . Multiple Vitamin (MULTIVITAMIN) tablet Take 1 tablet by mouth daily.      . Multiple Vitamins-Minerals (OCUVITE ADULT 50+) CAPS Take 1 capsule by mouth daily.    . prednisoLONE acetate (PREDNISOLONE ACETATE P-F) 1 % ophthalmic suspension Place 1 drop into both eyes 3 (three) times daily.    [name] SIMBRINZA 1-0.2 % SUSP Place 1 drop into both eyes 3 (three) times daily.  6   No facility-administered medications prior to visit.     ROS: Review of Systems  Constitutional: Negative for activity change, appetite change, chills, fatigue and unexpected weight change.  HENT: Negative for congestion, mouth sores and sinus pressure.   Eyes: Negative for visual disturbance.  Respiratory: Negative for cough and chest tightness.   Gastrointestinal: Negative for abdominal pain and nausea.  Genitourinary: Negative for difficulty urinating, frequency and vaginal pain.  Musculoskeletal: Negative for back pain and gait problem.  Skin: Negative for pallor and rash.  Neurological: Negative for dizziness, tremors, weakness, numbness  and headaches.  Psychiatric/Behavioral: Negative for confusion and sleep disturbance.    Objective:  BP 126/82 (BP Location: Left Arm, Patient Position: Sitting, Cuff Size: Normal)   Pulse 71   Temp 97.6 F (36.4 C) (Oral)   Ht 5\' 7"  (1.702 m)   Wt 140 lb (63.5 kg)   SpO2 99%   BMI 21.93 kg/m   BP Readings from Last 3 Encounters:  06/14/18 126/82  03/29/18 134/81  12/08/17 118/72    Wt Readings from Last 3 Encounters:  06/14/18 140 lb (63.5 kg)  03/29/18 137 lb (62.1 kg)  12/08/17 134 lb (60.8 kg)    Physical Exam Constitutional:      General: She is not in acute distress.    Appearance: She is well-developed.  HENT:     Head: Normocephalic.     Right Ear: External ear normal.     Left Ear: External ear normal.     Nose: Nose normal.  Eyes:     General:        Right eye: No discharge.        Left eye: No discharge.     Conjunctiva/sclera: Conjunctivae normal.     Pupils: Pupils are equal, round, and reactive to light.  Neck:     Musculoskeletal: Normal range of motion and neck supple.     Thyroid: No thyromegaly.     Vascular: No JVD.     Trachea: No tracheal deviation.  Cardiovascular:     Rate and Rhythm: Normal rate and regular rhythm.     Heart sounds: Normal heart sounds.  Pulmonary:     Effort: No respiratory distress.     Breath sounds: No stridor. No wheezing.  Abdominal:     General: Bowel sounds are normal. There is no distension.     Palpations: Abdomen is soft. There is no mass.     Tenderness: There is no abdominal tenderness. There is no guarding or rebound.  Musculoskeletal:        General: No tenderness.  Lymphadenopathy:     Cervical: No cervical adenopathy.  Skin:    Findings: No erythema or rash.  Neurological:     Cranial Nerves: No cranial nerve deficit.     Motor: No abnormal muscle tone.     Coordination: Coordination normal.     Deep Tendon Reflexes: Reflexes normal.  Psychiatric:        Behavior: Behavior normal.         Thought Content: Thought content normal.        Judgment: Judgment normal.   R knee is sensitive w/ROM  Lab Results  Component Value Date   WBC 4.1 09/02/2017   HGB 12.9 09/02/2017   HCT 38.9 09/02/2017   PLT 284.0 09/02/2017   GLUCOSE 81 09/02/2017   CHOL 178 06/12/2017   TRIG 80.0 06/12/2017   HDL 74.60 06/12/2017   LDLDIRECT 127.9 03/09/2013   LDLCALC 87 06/12/2017   ALT 22 09/02/2017   AST 23 09/02/2017   NA 142 09/02/2017   K 3.8 09/02/2017   CL 107 09/02/2017   CREATININE 0.83 09/02/2017   BUN 18 09/02/2017   CO2 29 09/02/2017   TSH 3.43 06/12/2017   INR 1.1 (H) 05/16/2015   HGBA1C 5.4 02/26/2011    Vas US Carotid  Result Date: 04/06/2018 Carotid Arterial Duplex Study Indications: CVD. Performing Technologist: Oliver Hum RVT  Examination Guidelines: A complete evaluation includes B-mode imaging, spectral Doppler, color Doppler, and power Doppler as needed of all accessible portions of each vessel. Bilateral testing is considered an integral part of a complete examination. Limited examinations for reoccurring indications may be performed as noted.  Right Carotid Findings: +----------+--------+--------+--------+-----------------------+--------+           PSV cm/sEDV cm/sStenosisDescribe               Comments +----------+--------+--------+--------+-----------------------+--------+ CCA Prox  89      19              smooth and heterogenous         +----------+--------+--------+--------+-----------------------+--------+ CCA Distal82      28              smooth and heterogenous         +----------+--------+--------+--------+-----------------------+--------+ ICA Prox  71      22              smooth and heterogenous         +----------+--------+--------+--------+-----------------------+--------+ ICA Distal79      30                                              +----------+--------+--------+--------+-----------------------+--------+ ECA       66       16                                              +----------+--------+--------+--------+-----------------------+--------+ +----------+--------+-------+--------+-------------------+  PSV cm/sEDV cmsDescribeArm Pressure (mmHG) +----------+--------+-------+--------+-------------------+ [id number]                                         +----------+--------+-------+--------+-------------------+ +---------+--------+--+--------+--+---------+ VertebralPSV cm/s64EDV cm/s23Antegrade +---------+--------+--+--------+--+---------+  Left Carotid Findings: +----------+--------+--------+--------+-----------------------+--------+           PSV cm/sEDV cm/sStenosisDescribe               Comments +----------+--------+--------+--------+-----------------------+--------+ CCA Prox  86      26              smooth and heterogenous         +----------+--------+--------+--------+-----------------------+--------+ CCA Distal72      22              smooth and heterogenous         +----------+--------+--------+--------+-----------------------+--------+ ICA Prox  45      19              smooth and heterogenous         +----------+--------+--------+--------+-----------------------+--------+ ICA Distal75      33                                              +----------+--------+--------+--------+-----------------------+--------+ ECA       60      14                                              +----------+--------+--------+--------+-----------------------+--------+ +----------+--------+--------+--------+-------------------+ SubclavianPSV cm/sEDV cm/sDescribeArm Pressure (mmHG) +----------+--------+--------+--------+-------------------+           95                                          +----------+--------+--------+--------+-------------------+ +---------+--------+--+--------+--+---------+ VertebralPSV cm/s53EDV cm/s18Antegrade  +---------+--------+--+--------+--+---------+  Summary: Right Carotid: Velocities in the right ICA are consistent with a 1-39% stenosis. Left Carotid: Velocities in the left ICA are consistent with a 1-39% stenosis. Vertebrals: Bilateral vertebral arteries demonstrate antegrade flow. *See table(s) above for measurements and observations.  Electronically signed by Deitra Mayo MD on 04/06/2018 at 12:19:39 PM.    Final     Assessment & Plan:   There are no diagnoses linked to this encounter.   No orders of the defined types were placed in this encounter.    Follow-up: No follow-ups on file.  Walker Kehr, MD

## 2018-06-15 ENCOUNTER — Other Ambulatory Visit (INDEPENDENT_AMBULATORY_CARE_PROVIDER_SITE_OTHER): Payer: Medicare Other

## 2018-06-15 DIAGNOSIS — R7989 Other specified abnormal findings of blood chemistry: Secondary | ICD-10-CM

## 2018-06-15 LAB — T4, FREE: Free T4: 0.73 ng/dL (ref 0.60–1.60)

## 2018-08-12 ENCOUNTER — Other Ambulatory Visit: Payer: Self-pay

## 2018-08-12 ENCOUNTER — Ambulatory Visit (INDEPENDENT_AMBULATORY_CARE_PROVIDER_SITE_OTHER): Payer: Medicare Other | Admitting: Internal Medicine

## 2018-08-12 ENCOUNTER — Encounter: Payer: Self-pay | Admitting: Internal Medicine

## 2018-08-12 DIAGNOSIS — H9202 Otalgia, left ear: Secondary | ICD-10-CM

## 2018-08-12 DIAGNOSIS — H9312 Tinnitus, left ear: Secondary | ICD-10-CM

## 2018-08-12 DIAGNOSIS — R42 Dizziness and giddiness: Secondary | ICD-10-CM | POA: Diagnosis not present

## 2018-08-12 NOTE — Progress Notes (Signed)
Virtual Visit via Telephone Note  I connected with Wendy Serrano on 08/12/18 at  2:40 PM EDT by telephone and verified that I am speaking with the correct person using two identifiers.   I discussed the limitations, risks, security and privacy concerns of performing an evaluation and management service by telephone and the availability of in person appointments. I also discussed with the patient that there may be a patient responsible charge related to this service. The patient expressed understanding and agreed to proceed.   History of Present Illness:   C/o an episode this morning of left-sided ear and area below the left ear pain that started when the patient bent over.  There was a crackling sound in the left ear present along with some ringing in the ear.  This has gotten better when the patient sat straight up, however, she continues to have symptoms.  There is a history of having similar problems in the past.  The patient had an ENT eval with Dr. Janace Hoard  last summer and had an MRI of the brain done on 01/13/2018 (normal).  This problem has come back several times. Observations/Objective:  NAD Looks well Assessment and Plan:  See plan Follow Up Instructions:    I discussed the assessment and treatment plan with the patient. The patient was provided an opportunity to ask questions and all were answered. The patient agreed with the plan and demonstrated an understanding of the instructions.   The patient was advised to call back or seek an in-person evaluation if the symptoms worsen or if the condition fails to improve as anticipated.  I provided 20 minutes of non-face-to-face time during this encounter.   Walker Kehr, MD

## 2018-08-12 NOTE — Assessment & Plan Note (Signed)
C/o an episode this morning of left-sided ear and area below the left ear pain that started when the patient bent over.  There was a crackling sound in the left ear present along with some ringing in the ear.  This has gotten better when the patient sat straight up, however, she continues to have symptoms.  There is a history of having similar problems in the past.  The patient had an ENT eval with Dr. Janace Hoard  last summer and had an MRI of the brain done on 01/13/2018 (normal).  This problem has come back several times. Likely trigeminal neuralgia.  Ibuprofen 600 mg twice daily as needed.  He does rice pack.  Call if not better.  Consider Valtrex if worse

## 2018-08-12 NOTE — Assessment & Plan Note (Signed)
Probable trigeminal neuralgia.  Ibuprofen as needed

## 2018-08-12 NOTE — Assessment & Plan Note (Signed)
Resolving

## 2018-09-27 ENCOUNTER — Encounter: Payer: Self-pay | Admitting: Internal Medicine

## 2018-09-27 ENCOUNTER — Ambulatory Visit (INDEPENDENT_AMBULATORY_CARE_PROVIDER_SITE_OTHER): Payer: Medicare Other | Admitting: Internal Medicine

## 2018-09-27 DIAGNOSIS — M25562 Pain in left knee: Secondary | ICD-10-CM | POA: Diagnosis not present

## 2018-09-27 DIAGNOSIS — G8929 Other chronic pain: Secondary | ICD-10-CM

## 2018-09-27 DIAGNOSIS — H209 Unspecified iridocyclitis: Secondary | ICD-10-CM

## 2018-09-27 DIAGNOSIS — H8112 Benign paroxysmal vertigo, left ear: Secondary | ICD-10-CM

## 2018-09-27 DIAGNOSIS — H9202 Otalgia, left ear: Secondary | ICD-10-CM | POA: Diagnosis not present

## 2018-09-27 MED ORDER — MECLIZINE HCL 12.5 MG PO TABS: 12.5000 mg | ORAL_TABLET | Freq: Three times a day (TID) | ORAL | 1 refills | Status: DC | PRN

## 2018-09-27 NOTE — Assessment & Plan Note (Signed)
ENT consultation with Dr Modena Nunnery Brand-Daroff exercises, meclizine as needed

## 2018-09-27 NOTE — Assessment & Plan Note (Signed)
ENT consult.  Ibuprofen as needed

## 2018-09-27 NOTE — Progress Notes (Signed)
Virtual Visit via Video Note  I connected with Wendy Serrano on 09/27/18 at  7:50 AM EDT by a video enabled telemedicine application and verified that I am speaking with the correct person using two identifiers.   I discussed the limitations of evaluation and management by telemedicine and the availability of in person appointments. The patient expressed understanding and agreed to proceed.  History of Present Illness: The patient is complaining of vertigo and crackling sensation in the left ear along with some ear discomfort/TMJ discomfort of several days duration.  She had this episode about a year ago.  She started to do Brand-Daroff exercises.  Her vertigo has been improving. She is complaining of knee pain.  She started to use hemp oil cream.  It seems to be helping. She is complaining of mild memory issues (like forgetting to put her eyedrops and on a rare occasion).  She is wondering if she could start taking Prevagen supplement There is no chest pain, shortness of breath, headache, hearing loss, fever, cough    Observations/Objective:  The patient is no acute distress she looks well.  She is alert oriented and cooperative Assessment and Plan: See plan  Follow Up Instructions:    I discussed the assessment and treatment plan with the patient. The patient was provided an opportunity to ask questions and all were answered. The patient agreed with the plan and demonstrated an understanding of the instructions.   The patient was advised to call back or seek an in-person evaluation if the symptoms worsen or if the condition fails to improve as anticipated.  I provided 25 minutes of non-face-to-face time during this encounter.   Walker Kehr, MD

## 2018-09-27 NOTE — Assessment & Plan Note (Signed)
Continue with eyedrops.  She is on methotrexate and folic acid

## 2018-09-27 NOTE — Assessment & Plan Note (Signed)
Okay to use hemp oil cream

## 2018-11-09 ENCOUNTER — Encounter

## 2018-11-09 ENCOUNTER — Other Ambulatory Visit: Payer: Self-pay

## 2018-11-09 ENCOUNTER — Ambulatory Visit: Payer: Medicare Other | Admitting: Neurology

## 2018-11-09 ENCOUNTER — Encounter: Payer: Self-pay | Admitting: Neurology

## 2018-11-09 VITALS — BP 130/78 | HR 78 | Temp 97.5°F | Ht 67.5 in | Wt 139.2 lb

## 2018-11-09 DIAGNOSIS — H9312 Tinnitus, left ear: Secondary | ICD-10-CM | POA: Diagnosis not present

## 2018-11-09 DIAGNOSIS — R42 Dizziness and giddiness: Secondary | ICD-10-CM | POA: Diagnosis not present

## 2018-11-09 MED ORDER — TOPIRAMATE 25 MG PO TABS: ORAL_TABLET | ORAL | 1 refills | Status: DC

## 2018-11-09 NOTE — Telephone Encounter (Deleted)
-----   Message from Kathrynn Ducking, MD sent at 11/09/2018 10:32 AM EDT ----- Please call the office of Dr. Janace Hoard and get the report of the audiometric testing done recently on this patient.  Thank you.

## 2018-11-09 NOTE — Patient Instructions (Signed)
We will start low dose Topamax for the tinnitus, if no benefit after 2 weeks, stop the medication.  Topamax (topiramate) is a seizure medication that has an FDA approval for seizures and for migraine headache. Potential side effects of this medication include weight loss, cognitive slowing, tingling in the fingers and toes, and carbonated drinks will taste bad. If any significant side effects are noted on this drug, please contact our office.

## 2018-11-09 NOTE — Telephone Encounter (Signed)
Please call the office of Dr. Janace Hoard and get the report of the audiometric testing done recently on this patient. Thank you.

## 2018-11-09 NOTE — Progress Notes (Signed)
Reason for visit: Tinnitus  Wendy Serrano is an 73 y.o. female  History of present illness:  Wendy Serrano is a 73 year old right-handed black female with a history of left-sided tinnitus that has come and gone over the last year or so.  The patient had an episode of vertigo in September 2019, this lasted about 10 days and then cleared and then the patient got tinnitus involving the left ear.  The patient was seen through this office in November 2019.  MRI of the brain was done and showed a moderate level small vessel disease.  The patient indicates that the tinnitus gradually went away but has come on again over the last 2 months, once again being preceded by a 10-day bout of vertigo, the vertigo would get better and then the tinnitus will come on the left ear.  She claims that she has had audiometric testing done through the office of Dr. Janace Serrano, she was told this was unremarkable and there was not any hearing deficit either with high or low frequency.  The patient continues to have tinnitus that is most notable in the morning, it tends to get better later in the day and does not keep her awake at night.  She also reports some mild forgetfulness, she may have difficulty remembering to take her medications.  Otherwise, she does all of her own activities of daily living.  Past Medical History:  Diagnosis Date  . Allergic rhinitis   . 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 C. difficile diarrhea 06/2017  . Eczema    childhood  . H/O osteopenia 01/09/04  . H/O osteoporosis 2008  . H/O varicella   . 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 History of measles, mumps, or rubella   . Leukopenia    chronic  . Liver hemangioma   . 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 Menopausal symptoms   . Renal cyst    bilateral  . Rotator cuff tear   . Yeast infection     Past Surgical History:  Procedure Laterality Date  . ROTATOR CUFF REPAIR Left 08/2009   Left shoulder-rotator cuff repair Wendy Serrano)  . WISDOM TOOTH EXTRACTION      Family History  Problem Relation Age of Onset  . Prostate cancer Father        died  . Pancreatic cancer Mother        died  . Diabetes Mother   . Hyperlipidemia Sister     Social history:  reports that she has never smoked. She has never used smokeless tobacco. She reports that she does not drink alcohol or use drugs.   No Known Allergies  Medications:  Prior to Admission medications   Medication Sig Start Date End Date Taking? Authorizing Provider  calcium-vitamin D (OSCAL WITH D) 500-200 MG-UNIT per tablet Take 1 tablet by mouth 2 (two) times daily.     Yes [provider]  folic acid (FOLVITE) 1 MG tablet Take 5 mg by mouth daily.  07/24/15  Yes [provider]  Garlic TABS Take 2 tablets by mouth 2 (two) times daily. Kyolic   Yes [provider]  glucosamine-chondroitin 500-400 MG tablet Take 1 tablet by mouth 2 (two) times daily.   Yes [provider]  methotrexate (RHEUMATREX) 2.5 MG tablet TAKE 8 TABLETS BY MOUTH ONCE WEEKLY AS DIRECTED 08/09/15  Yes [provider]  Multiple Vitamin (MULTIVITAMIN) tablet Take 1 tablet  by mouth daily.     Yes [provider]  Multiple Vitamins-Minerals (OCUVITE ADULT 50+) CAPS Take 1 capsule by mouth daily.   Yes [provider]  prednisoLONE acetate (PREDNISOLONE ACETATE P-F) 1 % ophthalmic suspension Place 1 drop into both eyes 2 (two) times daily.    Yes [provider]  SIMBRINZA 1-0.2 % SUSP Place 1 drop into both eyes 3 (three) times daily. 07/29/15  Yes [provider]  hydrocortisone valerate cream (WESTCORT) 0.2 % Apply topically 2 (two) times daily. Patient not taking: Reported on 11/09/2018 06/12/17   Plotnikov, Evie Lacks, MD    ROS:  Out of a complete 14 system review of symptoms, the patient complains only of the following symptoms, and all other reviewed systems are negative.   Left ear tinnitus Vertigo Memory problems  Blood pressure 130/78, pulse 78, temperature (!) 97.5 F (36.4 C), temperature source Temporal, height 5' 7.5" (1.715 m), weight 139 lb 4 oz (63.2 kg).  Physical Exam  General: The patient is alert and cooperative at the time of the examination.   Ears: Tympanic membranes are clear bilaterally.  Skin: No significant peripheral edema is noted.   Neurologic Exam  Mental status: The patient is alert and oriented x 3 at the time of the examination. The Mini-Mental status examination done today shows a total score 26/30.   Cranial nerves: Facial symmetry is present. Speech is normal, no aphasia or dysarthria is noted. Extraocular movements are full. Visual fields are full.  Motor: The patient has good strength in all 4 extremities.  Sensory examination: Soft touch sensation is symmetric on the face, arms, and legs.  Coordination: The patient has good finger-nose-finger and heel-to-shin bilaterally.  Gait and station: The patient has a normal gait. Tandem gait is normal. Romberg is negative. No drift is seen.  Reflexes: Deep tendon reflexes are symmetric.   Assessment/Plan:  1.  Episodic vertigo and left sided tinnitus  2.  Reported mild memory disturbance  We will check a memory test today, the patient reports some episodic vertigo that precedes onset of tinnitus in the left ear.  Etiology of this is not clear, this could potentially represent a low-grade Mnire's or cochlear hydrops process.  We will try to get the report of the audiometric testing done recently.  I am not clear that there is any particular treatment right now for her issues but we will try low-dose Topamax over the next 2 weeks to see if this does help her symptoms, if not she is to stop the medication.  Wendy Alexanders MD 11/09/2018 10:01 AM  [facility] [address] [place], [place] [address]  Phone [phone] Fax  [phone]

## 2018-11-10 ENCOUNTER — Encounter: Payer: Self-pay | Admitting: Neurology

## 2018-11-10 ENCOUNTER — Telehealth: Payer: Self-pay | Admitting: Neurology

## 2018-11-10 NOTE — Telephone Encounter (Signed)
I received the results of the audiometric testing that was done on this patient on 20 October 2018.  The hearing test revealed stable, borderline within normal limits hearing sensitivity in both ears from 250 to 8000 Hz.

## 2018-11-18 ENCOUNTER — Other Ambulatory Visit: Payer: Self-pay | Admitting: Neurology

## 2018-11-25 ENCOUNTER — Other Ambulatory Visit: Payer: Self-pay | Admitting: Neurology

## 2018-11-25 ENCOUNTER — Other Ambulatory Visit: Payer: Self-pay

## 2018-11-25 MED ORDER — TOPIRAMATE 25 MG PO TABS: ORAL_TABLET | ORAL | 1 refills | Status: DC

## 2018-12-07 IMAGING — MR MR HEAD WO/W CM
12 of 13 series · 37 of 48 positions shown · IV contrast (multihance)
Comparison: CT head 02/11/2014, MRI head 02/25/2010

CLINICAL DATA: Bilateral tinnitus

Creatinine was obtained on site at [HOSPITAL] at [HOSPITAL].
Results: Creatinine 0.9 mg/dL.
EXAM:
MRI HEAD WITHOUT AND WITH CONTRAST
TECHNIQUE: Multiplanar, multiecho pulse sequences of the brain and surrounding
structures were obtained without and with intravenous contrast.
CONTRAST:  12mL MULTIHANCE GADOBENATE DIMEGLUMINE 529 MG/ML IV SOLN

[Series 2: T1 · sagittal · 5.0mm · 0.45mm/px · 3 of 21 slices shown (1 of 3)]
[im 1/21]
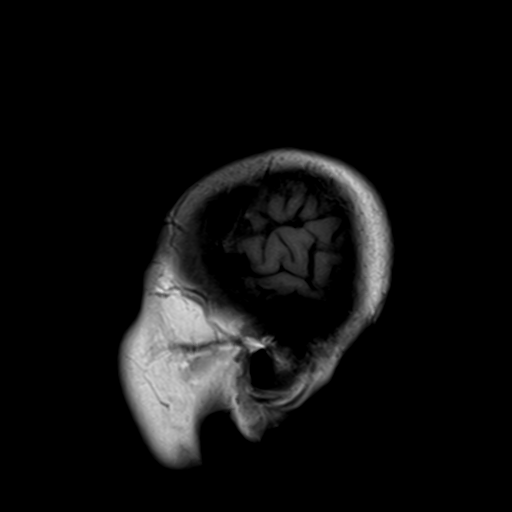
[im 11/21]
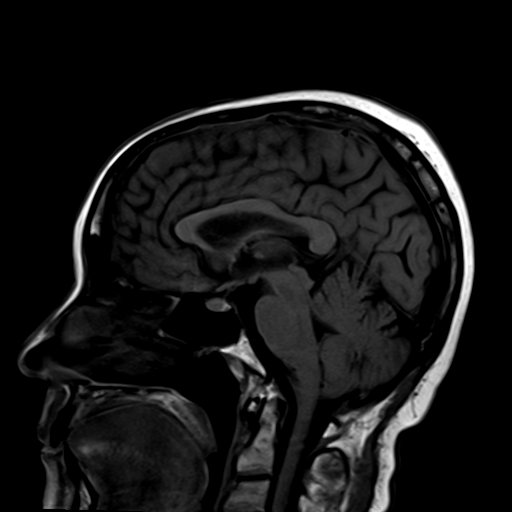
[im 21/21]
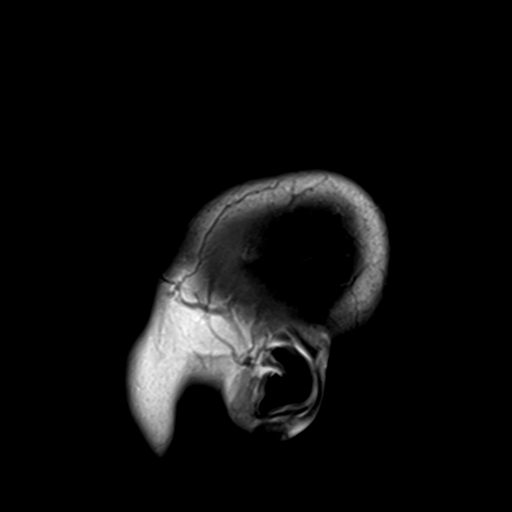

[Series 3: DWI · axial · 3.0mm · 1.80mm/px · z∈[-66,+81]mm · 8 of 98 slices shown (1 of 2)]
[im 1/98]
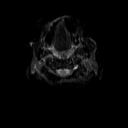
[im 11/98]
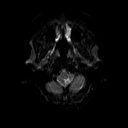
[im 33/98]
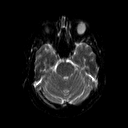
[im 44/98]
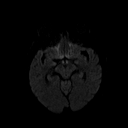
[im 54/98]
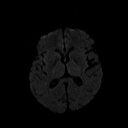
[im 65/98]
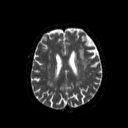
[im 87/98]
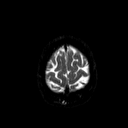
[im 98/98]
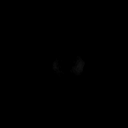

[Series 4: DWI · axial · 3.0mm · 1.80mm/px · z∈[-66,+81]mm · 5 of 50 slices shown (2 of 2)]
[im 1/50]
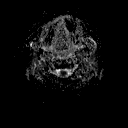
[im 13/50]
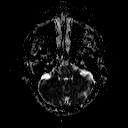
[im 25/50]
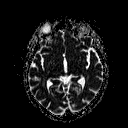
[im 37/50]
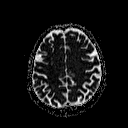
[im 50/50]
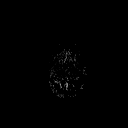

[Series 5: T2 · axial · 5.0mm · 0.45mm/px · z∈[-63,+80]mm · 2 of 23 slices shown]
[im 1/23]
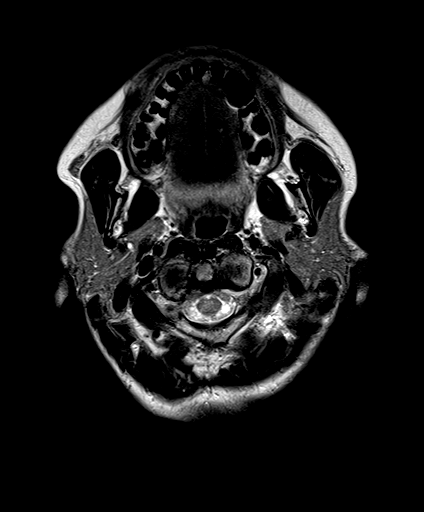
[im 23/23]
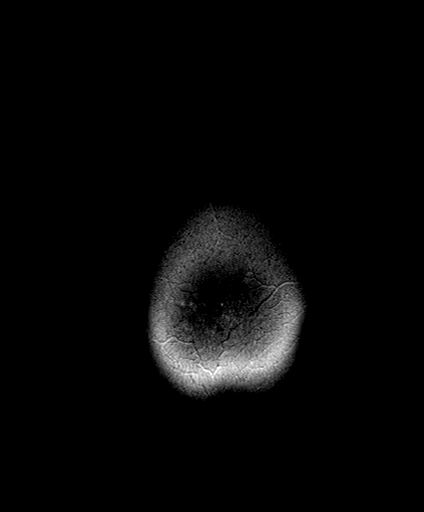

[Series 6: FLAIR · axial · 3.0mm · 0.45mm/px · z∈[-71,+85]mm · 3 of 27 slices shown (1 of 2)]
[im 1/27]
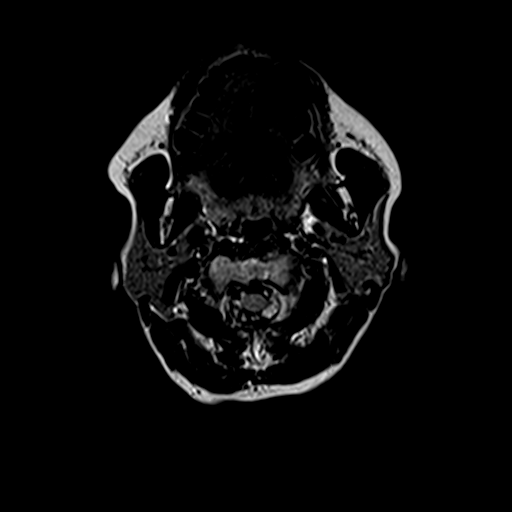
[im 14/27]
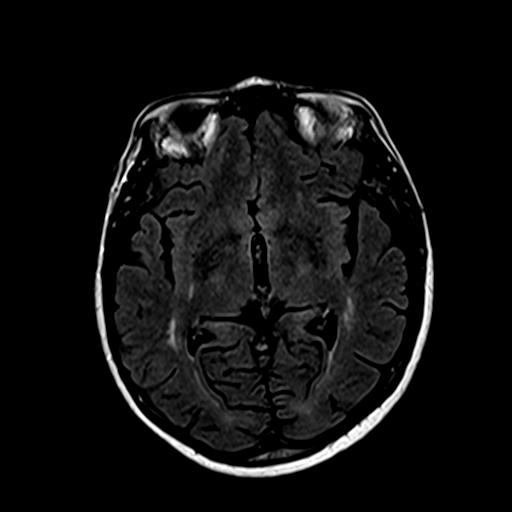
[im 27/27]
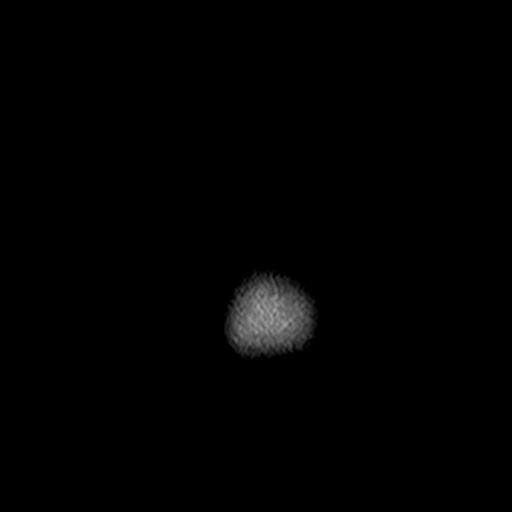

[Series 8: swi_images · axial · 2.0mm · 0.90mm/px · z∈[-71,+87]mm · 8 of 80 slices shown]
[im 1/80]
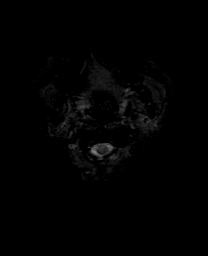
[im 12/80]
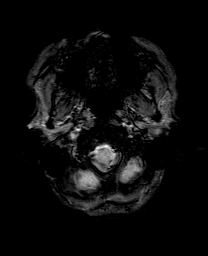
[im 23/80]
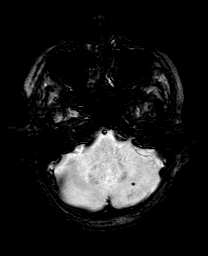
[im 34/80]
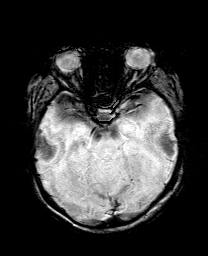
[im 46/80]
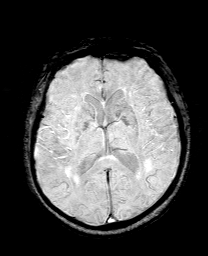
[im 57/80]
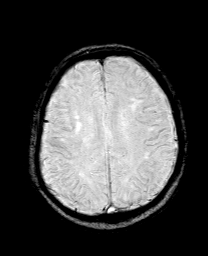
[im 68/80]
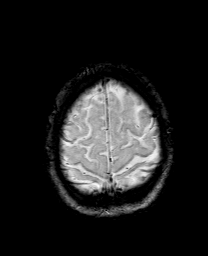
[im 80/80]
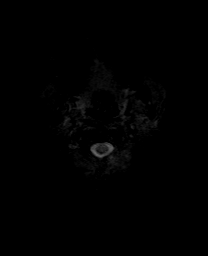

[Series 9: T1 · coronal · 3.0mm · 0.35mm/px · 1 of 13 slices shown (2 of 3)]
[im 1/13]
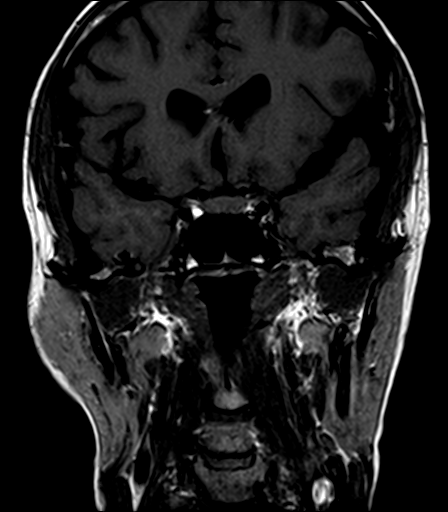

[Series 10: T1 · axial · 3.0mm · 0.35mm/px · 1 of 13 slices shown (3 of 3)]
[im 1/13]
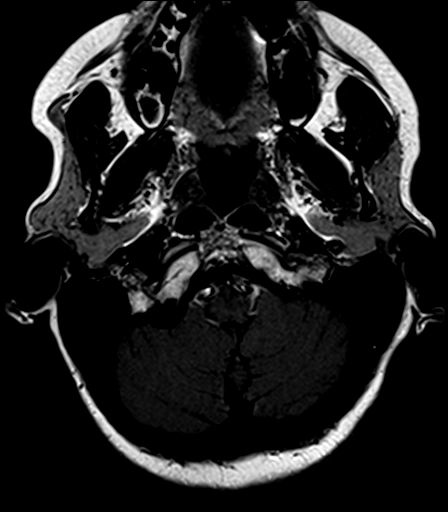

[Series 11: bSSFP · axial · 1.0mm · 0.28mm/px · z∈[-58,-48]mm · 2 of 36 slices shown]
[im 1/36]
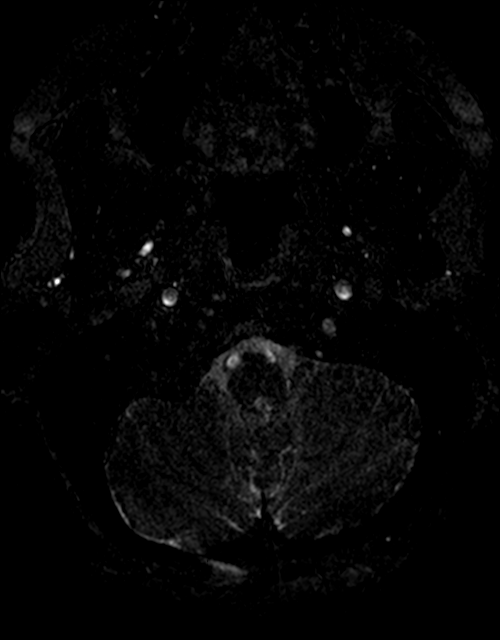
[im 12/36]
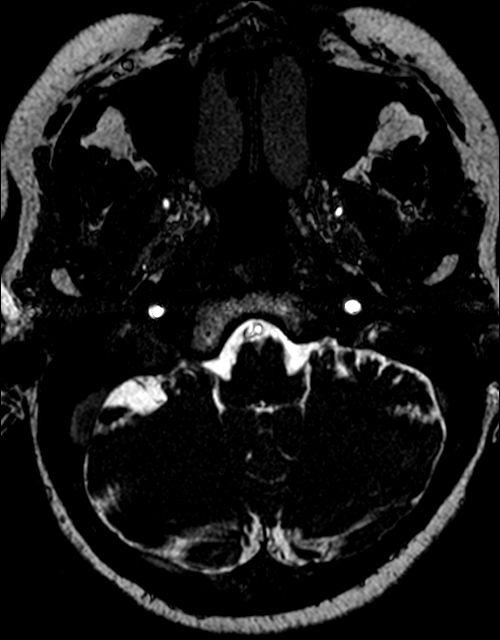

[Series 12: FLAIR · sagittal · 5.0mm · 0.45mm/px · 2 of 25 slices shown (2 of 2)]
[im 1/25]
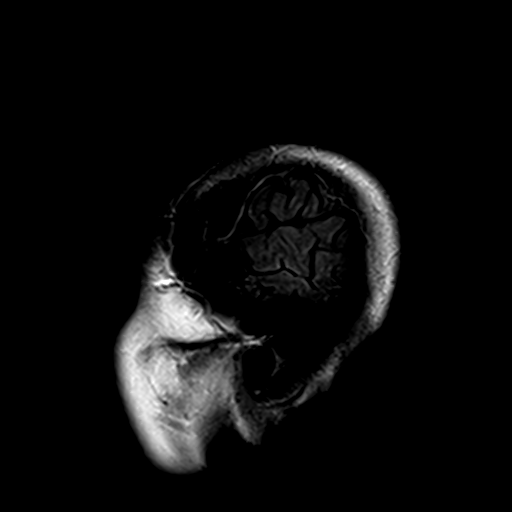
[im 25/25]
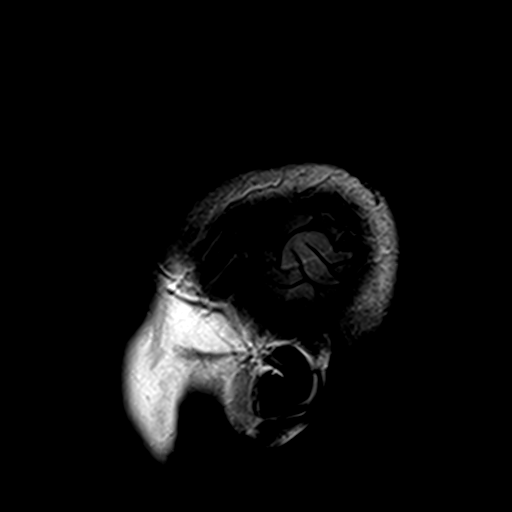

[Series 13: T1 post-contrast · coronal · 3.0mm · 0.35mm/px · 1 of 13 slices shown (1 of 2)]
[im 1/13]
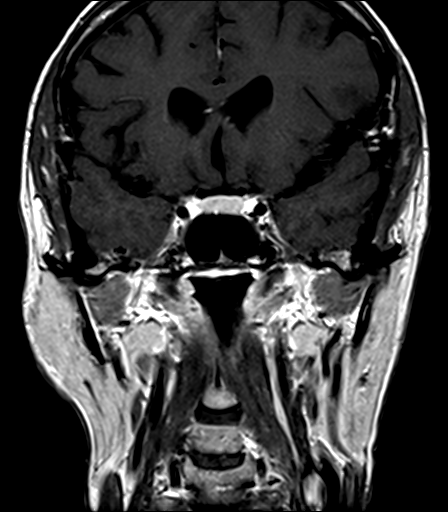

[Series 14: T1 post-contrast · axial · 3.0mm · 0.35mm/px · 1 of 13 slices shown (2 of 2)]
[im 1/13]
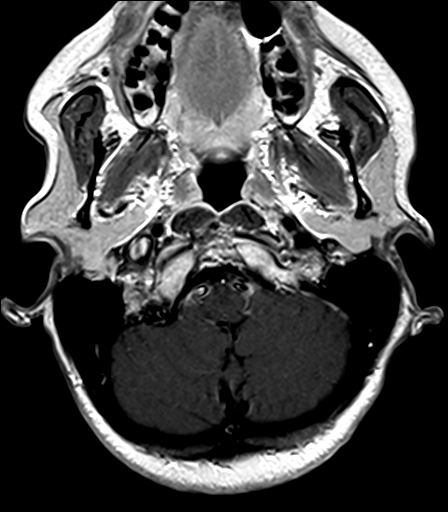

[37 of 48 positions shown; findings below may reference images not displayed]

FINDINGS: Brain: IAC protocol was performed including thin section imaging
through the posterior fossa before and after intravenous contrast.
Seventh and eighth cranial nerves normal. Negative for vestibular
schwannoma. Basilar cisterns normal. Mastoid sinus clear. No
enhancing mass in the posterior fossa or temporal bone.

Ventricle size normal for age. Cerebral volume normal for age.
Moderate changes in the white matter bilaterally. Brainstem intact.
Small areas of hyperintensity in the cerebellum bilaterally due to
chronic ischemia. Chronic microhemorrhage left cerebellum is new
since 4111.

Vascular: Normal arterial flow voids

Skull and upper cervical spine: Negative

Sinuses/Orbits: Mild mucosal edema paranasal sinuses. Bilateral
cataract surgery

Other: None
IMPRESSION: Progression of chronic microvascular ischemic change in the white
matter and cerebellum since 4111. New area of chronic
microhemorrhage in the left cerebellum since 4111. This may be due
to chronic hypertension.

No acute abnormality.

## 2019-01-12 ENCOUNTER — Ambulatory Visit (INDEPENDENT_AMBULATORY_CARE_PROVIDER_SITE_OTHER): Payer: Medicare Other

## 2019-01-12 ENCOUNTER — Ambulatory Visit: Payer: Medicare Other

## 2019-01-12 DIAGNOSIS — Z23 Encounter for immunization: Secondary | ICD-10-CM | POA: Diagnosis not present

## 2019-01-24 ENCOUNTER — Ambulatory Visit: Payer: Medicare Other | Admitting: Neurology

## 2019-02-09 ENCOUNTER — Other Ambulatory Visit: Payer: Self-pay

## 2019-02-09 ENCOUNTER — Ambulatory Visit: Payer: Medicare Other | Admitting: Internal Medicine

## 2019-02-09 ENCOUNTER — Ambulatory Visit (INDEPENDENT_AMBULATORY_CARE_PROVIDER_SITE_OTHER): Payer: Medicare Other | Admitting: Internal Medicine

## 2019-02-09 ENCOUNTER — Encounter: Payer: Self-pay | Admitting: Internal Medicine

## 2019-02-09 VITALS — BP 126/72 | HR 88 | Temp 98.3°F | Ht 67.5 in | Wt 137.0 lb

## 2019-02-09 DIAGNOSIS — L700 Acne vulgaris: Secondary | ICD-10-CM

## 2019-02-09 DIAGNOSIS — H8112 Benign paroxysmal vertigo, left ear: Secondary | ICD-10-CM

## 2019-02-09 DIAGNOSIS — H9312 Tinnitus, left ear: Secondary | ICD-10-CM | POA: Diagnosis not present

## 2019-02-09 DIAGNOSIS — R413 Other amnesia: Secondary | ICD-10-CM

## 2019-02-09 NOTE — Progress Notes (Addendum)
Subjective:  Patient ID: Wendy Serrano, female    DOB: 14-Aug-1945  Age: 73 y.o. MRN: [id number]  CC: No chief complaint on file.   HPI Wendy Serrano presents for memory C/o a spot on the back, her husband squeezed it.  Something came out of it. The patient is complaining of the persistent ringing in the ear.  There is no vertigo or substantial hearing loss.  Outpatient Medications Prior to Visit  Medication Sig Dispense Refill  . calcium-vitamin D (OSCAL WITH D) 500-200 MG-UNIT per tablet Take 1 tablet by mouth 2 (two) times daily.      . folic acid (FOLVITE) 1 MG tablet Take 5 mg by mouth daily.     . Garlic TABS Take 2 tablets by mouth 2 (two) times daily. Kyolic    . glucosamine-chondroitin 500-400 MG tablet Take 1 tablet by mouth 2 (two) times daily.    . hydrocortisone valerate cream (WESTCORT) 0.2 % Apply topically 2 (two) times daily. 45 g 2  . methotrexate (RHEUMATREX) 2.5 MG tablet TAKE 8 TABLETS BY MOUTH ONCE WEEKLY AS DIRECTED  3  . Multiple Vitamin (MULTIVITAMIN) tablet Take 1 tablet by mouth daily.      . Multiple Vitamins-Minerals (OCUVITE ADULT 50+) CAPS Take 1 capsule by mouth daily.    . prednisoLONE acetate (PREDNISOLONE ACETATE P-F) 1 % ophthalmic suspension Place 1 drop into both eyes 2 (two) times daily.     [name] SIMBRINZA 1-0.2 % SUSP Place 1 drop into both eyes 3 (three) times daily.  6  . topiramate (TOPAMAX) 25 MG tablet TAKE 1 TABLET AT NIGHT FOR 1 WEEK, THEN TAKE 2 TABLETS AT NIGHT 180 tablet 1   No facility-administered medications prior to visit.     ROS: Review of Systems  Constitutional: Negative for activity change, appetite change, chills, fatigue and unexpected weight change.  HENT: Negative for congestion, mouth sores and sinus pressure.   Eyes: Negative for visual disturbance.  Respiratory: Negative for cough and chest tightness.   Gastrointestinal: Negative for abdominal pain and nausea.  Genitourinary: Negative for difficulty urinating,  frequency and vaginal pain.  Musculoskeletal: Negative for back pain and gait problem.  Skin: Positive for color change. Negative for pallor and rash.  Neurological: Negative for dizziness, tremors, weakness, numbness and headaches.  Psychiatric/Behavioral: Positive for decreased concentration. Negative for confusion and sleep disturbance.    Objective:  BP 126/72 (BP Location: Left Arm, Patient Position: Sitting, Cuff Size: Normal)   Pulse 88   Temp 98.3 F (36.8 C) (Oral)   Ht 5' 7.5" (1.715 m)   Wt 137 lb (62.1 kg)   SpO2 98%   BMI 21.14 kg/m   BP Readings from Last 3 Encounters:  02/09/19 126/72  11/09/18 130/78  06/14/18 126/82    Wt Readings from Last 3 Encounters:  02/09/19 137 lb (62.1 kg)  11/09/18 139 lb 4 oz (63.2 kg)  06/14/18 140 lb (63.5 kg)    Physical Exam Constitutional:      General: She is not in acute distress.    Appearance: She is well-developed.  HENT:     Head: Normocephalic.     Right Ear: External ear normal.     Left Ear: External ear normal.     Nose: Nose normal.  Eyes:     General:        Right eye: No discharge.        Left eye: No discharge.     Conjunctiva/sclera: Conjunctivae normal.  Pupils: Pupils are equal, round, and reactive to light.  Neck:     Musculoskeletal: Normal range of motion and neck supple.     Thyroid: No thyromegaly.     Vascular: No JVD.     Trachea: No tracheal deviation.  Cardiovascular:     Rate and Rhythm: Normal rate and regular rhythm.     Heart sounds: Normal heart sounds.  Pulmonary:     Effort: No respiratory distress.     Breath sounds: No stridor. No wheezing.  Abdominal:     General: Bowel sounds are normal. There is no distension.     Palpations: Abdomen is soft. There is no mass.     Tenderness: There is no abdominal tenderness. There is no guarding or rebound.  Musculoskeletal:        General: No tenderness.  Lymphadenopathy:     Cervical: No cervical adenopathy.  Skin:    Findings:  No erythema or rash.  Neurological:     Mental Status: She is oriented to person, place, and time.     Cranial Nerves: No cranial nerve deficit.     Motor: No abnormal muscle tone.     Coordination: Coordination normal.     Deep Tendon Reflexes: Reflexes normal.  Psychiatric:        Behavior: Behavior normal.        Thought Content: Thought content normal.        Judgment: Judgment normal.    2 mm black head on the upper mid back was removed with a curette Lab Results  Component Value Date   WBC 3.9 (L) 06/14/2018   HGB 12.8 06/14/2018   HCT 38.2 06/14/2018   PLT 255.0 06/14/2018   GLUCOSE 92 06/14/2018   CHOL 223 (H) 06/14/2018   TRIG 49.0 06/14/2018   HDL 88.40 06/14/2018   LDLDIRECT 127.9 03/09/2013   LDLCALC 125 (H) 06/14/2018   ALT 26 06/14/2018   AST 28 06/14/2018   NA 142 06/14/2018   K 3.4 (L) 06/14/2018   CL 107 06/14/2018   CREATININE 0.86 06/14/2018   BUN 22 06/14/2018   CO2 26 06/14/2018   TSH 5.13 (H) 06/14/2018   INR 1.1 (H) 05/16/2015   HGBA1C 5.4 02/26/2011    Vas US Carotid  Result Date: 04/06/2018 Carotid Arterial Duplex Study Indications: CVD. Performing Technologist: Oliver Hum RVT  Examination Guidelines: A complete evaluation includes B-mode imaging, spectral Doppler, color Doppler, and power Doppler as needed of all accessible portions of each vessel. Bilateral testing is considered an integral part of a complete examination. Limited examinations for reoccurring indications may be performed as noted.  Right Carotid Findings: +----------+--------+--------+--------+-----------------------+--------+           PSV cm/sEDV cm/sStenosisDescribe               Comments +----------+--------+--------+--------+-----------------------+--------+ CCA Prox  89      19              smooth and heterogenous         +----------+--------+--------+--------+-----------------------+--------+ CCA Distal82      28              smooth and heterogenous          +----------+--------+--------+--------+-----------------------+--------+ ICA Prox  71      22              smooth and heterogenous         +----------+--------+--------+--------+-----------------------+--------+ ICA Distal79      30                                              +----------+--------+--------+--------+-----------------------+--------+  ECA       66      16                                              +----------+--------+--------+--------+-----------------------+--------+ +----------+--------+-------+--------+-------------------+           PSV cm/sEDV cmsDescribeArm Pressure (mmHG) +----------+--------+-------+--------+-------------------+ [id number]                                         +----------+--------+-------+--------+-------------------+ +---------+--------+--+--------+--+---------+ VertebralPSV cm/s64EDV cm/s23Antegrade +---------+--------+--+--------+--+---------+  Left Carotid Findings: +----------+--------+--------+--------+-----------------------+--------+           PSV cm/sEDV cm/sStenosisDescribe               Comments +----------+--------+--------+--------+-----------------------+--------+ CCA Prox  86      26              smooth and heterogenous         +----------+--------+--------+--------+-----------------------+--------+ CCA Distal72      22              smooth and heterogenous         +----------+--------+--------+--------+-----------------------+--------+ ICA Prox  45      19              smooth and heterogenous         +----------+--------+--------+--------+-----------------------+--------+ ICA Distal75      33                                              +----------+--------+--------+--------+-----------------------+--------+ ECA       60      14                                              +----------+--------+--------+--------+-----------------------+--------+  +----------+--------+--------+--------+-------------------+ SubclavianPSV cm/sEDV cm/sDescribeArm Pressure (mmHG) +----------+--------+--------+--------+-------------------+           95                                          +----------+--------+--------+--------+-------------------+ +---------+--------+--+--------+--+---------+ VertebralPSV cm/s53EDV cm/s18Antegrade +---------+--------+--+--------+--+---------+  Summary: Right Carotid: Velocities in the right ICA are consistent with a 1-39% stenosis. Left Carotid: Velocities in the left ICA are consistent with a 1-39% stenosis. Vertebrals: Bilateral vertebral arteries demonstrate antegrade flow. *See table(s) above for measurements and observations.  Electronically signed by Deitra Mayo MD on 04/06/2018 at 12:19:39 PM.    Final     Assessment & Plan:   There are no diagnoses linked to this encounter.   No orders of the defined types were placed in this encounter.    Follow-up: No follow-ups on file.  Walker Kehr, MD

## 2019-02-14 ENCOUNTER — Encounter: Payer: Self-pay | Admitting: Internal Medicine

## 2019-02-14 DIAGNOSIS — R413 Other amnesia: Secondary | ICD-10-CM | POA: Insufficient documentation

## 2019-02-14 DIAGNOSIS — F0390 Unspecified dementia without behavioral disturbance: Secondary | ICD-10-CM | POA: Insufficient documentation

## 2019-02-14 DIAGNOSIS — L700 Acne vulgaris: Secondary | ICD-10-CM | POA: Insufficient documentation

## 2019-02-14 NOTE — Assessment & Plan Note (Signed)
The content was curetted out

## 2019-02-14 NOTE — Assessment & Plan Note (Signed)
No relapse 

## 2019-02-14 NOTE — Assessment & Plan Note (Signed)
mild cognitive deficit.  Very minor issues.  Formal memory testing was offered.  Take B complex vitamin

## 2019-03-04 ENCOUNTER — Other Ambulatory Visit: Payer: Self-pay | Admitting: Internal Medicine

## 2019-03-04 DIAGNOSIS — Z1231 Encounter for screening mammogram for malignant neoplasm of breast: Secondary | ICD-10-CM

## 2019-04-25 ENCOUNTER — Ambulatory Visit: Payer: Medicare Other

## 2019-05-16 ENCOUNTER — Ambulatory Visit: Payer: Medicare Other | Attending: Internal Medicine

## 2019-05-16 DIAGNOSIS — Z20822 Contact with and (suspected) exposure to covid-19: Secondary | ICD-10-CM | POA: Diagnosis not present

## 2019-05-18 LAB — NOVEL CORONAVIRUS, NAA

## 2019-05-19 ENCOUNTER — Ambulatory Visit: Payer: Medicare Other | Attending: Internal Medicine

## 2019-05-19 DIAGNOSIS — Z20822 Contact with and (suspected) exposure to covid-19: Secondary | ICD-10-CM | POA: Diagnosis not present

## 2019-05-21 LAB — NOVEL CORONAVIRUS, NAA: SARS-CoV-2, NAA: NOT DETECTED

## 2019-05-25 ENCOUNTER — Ambulatory Visit: Payer: Medicare Other | Attending: Internal Medicine

## 2019-05-25 DIAGNOSIS — Z23 Encounter for immunization: Secondary | ICD-10-CM | POA: Insufficient documentation

## 2019-05-25 NOTE — Progress Notes (Signed)
   Covid-19 Vaccination Clinic  Name:  Wendy Serrano    MRN: [id number] DOB: Nov 01, 1945  05/25/2019  Wendy Serrano was observed post Covid-19 immunization for 15 minutes without incidence. She was provided with Vaccine Information Sheet and instruction to access the V-Safe system.   Wendy Serrano was instructed to call 911 with any severe reactions post vaccine: [name] Difficulty breathing  . Swelling of your face and throat  . A fast heartbeat  . A bad rash all over your body  . Dizziness and weakness    Immunizations Administered    Name Date Dose VIS Date Route   Pfizer COVID-19 Vaccine 05/25/2019 10:30 AM 0.3 mL 04/22/2019 Intramuscular   Manufacturer: [employer], [employer]   Lot: [id number]   [place]: [id number]

## 2019-06-01 DIAGNOSIS — H40013 Open angle with borderline findings, low risk, bilateral: Secondary | ICD-10-CM | POA: Diagnosis not present

## 2019-06-07 DIAGNOSIS — M2241 Chondromalacia patellae, right knee: Secondary | ICD-10-CM | POA: Diagnosis not present

## 2019-06-07 DIAGNOSIS — M25561 Pain in right knee: Secondary | ICD-10-CM | POA: Diagnosis not present

## 2019-06-15 ENCOUNTER — Ambulatory Visit: Payer: Medicare Other

## 2019-06-15 ENCOUNTER — Ambulatory Visit: Payer: Medicare PPO | Attending: Internal Medicine

## 2019-06-15 DIAGNOSIS — Z23 Encounter for immunization: Secondary | ICD-10-CM | POA: Insufficient documentation

## 2019-06-15 NOTE — Progress Notes (Signed)
   Covid-19 Vaccination Clinic  Name:  Wendy Serrano    MRN: [id number] DOB: [birth year]  06/15/2019  Ms. Wendy Serrano was observed post Covid-19 immunization for 15 minutes without incidence. She was provided with Vaccine Information Sheet and instruction to access the V-Safe system.   Ms. Wendy Serrano was instructed to call 911 with any severe reactions post vaccine: [name] Difficulty breathing  . Swelling of your face and throat  . A fast heartbeat  . A bad rash all over your body  . Dizziness and weakness    Immunizations Administered    Name Date Dose VIS Date Route   Pfizer COVID-19 Vaccine 06/15/2019 10:32 AM 0.3 mL 04/22/2019 Intramuscular   Manufacturer: [place]   Lot: [id number]   [place]: [id number]

## 2019-06-16 ENCOUNTER — Encounter: Payer: Self-pay | Admitting: Internal Medicine

## 2019-06-16 ENCOUNTER — Other Ambulatory Visit: Payer: Self-pay

## 2019-06-16 ENCOUNTER — Ambulatory Visit (INDEPENDENT_AMBULATORY_CARE_PROVIDER_SITE_OTHER): Payer: Medicare PPO | Admitting: Internal Medicine

## 2019-06-16 VITALS — BP 132/78 | HR 85 | Temp 98.3°F | Ht 67.5 in | Wt 137.0 lb

## 2019-06-16 DIAGNOSIS — G8929 Other chronic pain: Secondary | ICD-10-CM

## 2019-06-16 DIAGNOSIS — M25561 Pain in right knee: Secondary | ICD-10-CM

## 2019-06-16 DIAGNOSIS — Z0000 Encounter for general adult medical examination without abnormal findings: Secondary | ICD-10-CM

## 2019-06-16 DIAGNOSIS — E785 Hyperlipidemia, unspecified: Secondary | ICD-10-CM

## 2019-06-16 DIAGNOSIS — M25562 Pain in left knee: Secondary | ICD-10-CM | POA: Diagnosis not present

## 2019-06-16 DIAGNOSIS — M810 Age-related osteoporosis without current pathological fracture: Secondary | ICD-10-CM

## 2019-06-16 DIAGNOSIS — E559 Vitamin D deficiency, unspecified: Secondary | ICD-10-CM | POA: Diagnosis not present

## 2019-06-16 DIAGNOSIS — G3184 Mild cognitive impairment, so stated: Secondary | ICD-10-CM

## 2019-06-16 DIAGNOSIS — N959 Unspecified menopausal and perimenopausal disorder: Secondary | ICD-10-CM

## 2019-06-16 DIAGNOSIS — F09 Unspecified mental disorder due to known physiological condition: Secondary | ICD-10-CM | POA: Insufficient documentation

## 2019-06-16 LAB — CBC WITH DIFFERENTIAL/PLATELET
Basophils Absolute: 0 10*3/uL (ref 0.0–0.1)
Basophils Relative: 0.7 % (ref 0.0–3.0)
Eosinophils Absolute: 0 10*3/uL (ref 0.0–0.7)
Eosinophils Relative: 0.6 % (ref 0.0–5.0)
HCT: 39.3 % (ref 36.0–46.0)
Hemoglobin: 13.1 g/dL (ref 12.0–15.0)
Lymphocytes Relative: 8.1 % — ABNORMAL LOW (ref 12.0–46.0)
Lymphs Abs: 0.4 10*3/uL — ABNORMAL LOW (ref 0.7–4.0)
MCHC: 33.3 g/dL (ref 30.0–36.0)
MCV: 97 fl (ref 78.0–100.0)
Monocytes Absolute: 0.3 10*3/uL (ref 0.1–1.0)
Monocytes Relative: 6.5 % (ref 3.0–12.0)
Neutro Abs: 3.9 10*3/uL (ref 1.4–7.7)
Neutrophils Relative %: 84.1 % — ABNORMAL HIGH (ref 43.0–77.0)
Platelets: 272 10*3/uL (ref 150.0–400.0)
RBC: 4.05 Mil/uL (ref 3.87–5.11)
RDW: 13.9 % (ref 11.5–15.5)
WBC: 4.6 10*3/uL (ref 4.0–10.5)

## 2019-06-16 LAB — LIPID PANEL
Cholesterol: 227 mg/dL — ABNORMAL HIGH (ref 0–200)
HDL: 96 mg/dL (ref >39.00)
LDL Cholesterol: 125 mg/dL — ABNORMAL HIGH (ref 0–99)
NonHDL: 130.87
Total CHOL/HDL Ratio: 2
Triglycerides: 31 mg/dL (ref 0.0–149.0)
VLDL: 6.2 mg/dL (ref 0.0–40.0)

## 2019-06-16 LAB — HEPATIC FUNCTION PANEL
ALT: 19 U/L (ref 0–35)
AST: 23 U/L (ref 0–37)
Albumin: 4.6 g/dL (ref 3.5–5.2)
Alkaline Phosphatase: 62 U/L (ref 39–117)
Bilirubin, Direct: 0.1 mg/dL (ref 0.0–0.3)
Total Bilirubin: 0.7 mg/dL (ref 0.2–1.2)
Total Protein: 7.5 g/dL (ref 6.0–8.3)

## 2019-06-16 LAB — BASIC METABOLIC PANEL
BUN: 17 mg/dL (ref 6–23)
CO2: 29 mEq/L (ref 19–32)
Calcium: 9.8 mg/dL (ref 8.4–10.5)
Chloride: 105 mEq/L (ref 96–112)
Creatinine, Ser: 0.81 mg/dL (ref 0.40–1.20)
GFR: 83.45 mL/min (ref >60.00)
Glucose, Bld: 97 mg/dL (ref 70–99)
Potassium: 4 mEq/L (ref 3.5–5.1)
Sodium: 141 mEq/L (ref 135–145)

## 2019-06-16 LAB — TSH: TSH: 6.85 u[IU]/mL — ABNORMAL HIGH (ref 0.35–4.50)

## 2019-06-16 LAB — URINALYSIS
Bilirubin Urine: NEGATIVE
Hgb urine dipstick: NEGATIVE
Ketones, ur: NEGATIVE
Leukocytes,Ua: NEGATIVE
Nitrite: NEGATIVE
Specific Gravity, Urine: >=1.03 — AB (ref 1.000–1.030)
Urine Glucose: NEGATIVE
Urobilinogen, UA: 0.2 (ref 0.0–1.0)
pH: 5.5 (ref 5.0–8.0)

## 2019-06-16 LAB — VITAMIN D 25 HYDROXY (VIT D DEFICIENCY, FRACTURES): VITD: 37.45 ng/mL (ref 30.00–100.00)

## 2019-06-16 MED ORDER — B COMPLEX PO TABS: 1.0000 | ORAL_TABLET | Freq: Every day | ORAL | 3 refills | Status: DC

## 2019-06-16 NOTE — Assessment & Plan Note (Signed)
Here for medicare wellness/physical  Diet: heart healthy  Physical activity: not sedentary  Depression/mood screen: negative  Hearing: intact to whispered voice  Visual acuity: grossly normal - reading glasses, performs reg eye exam at [facility] - uveitis ADLs: capable  Fall risk: low to none  Home safety: good  Cognitive evaluation: intact to orientation, naming, recall and repetition  EOL planning: adv directives, full code/ I agree  I have personally reviewed and have noted  1. The patient's medical, surgical and social history  2. Their use of alcohol, tobacco or illicit drugs  3. Their current medications and supplements  4. The patient's functional ability including ADL's, fall risks, home safety risks and hearing or visual impairment.  5. Diet and physical activities  6. Evidence for depression or mood disorders 7. The roster of all physicians providing medical care to patient - is listed in the Snapshot section of the chart and reviewed today.    Today patient counseled on age appropriate routine health concerns for screening and prevention, each reviewed and up to date or declined. Immunizations reviewed and up to date or declined. Labs ordered and reviewed. Risk factors for depression reviewed and negative. Hearing function and visual acuity are intact. ADLs screened and addressed as needed. Functional ability and level of safety reviewed and appropriate. Education, counseling and referrals performed based on assessed risks today. Patient provided with a copy of personalized plan for preventive services.   colonsocopy 2014 cardiac CT scan for calcium scoring offered 2/20  shingles - previously declined

## 2019-06-16 NOTE — Assessment & Plan Note (Addendum)
Mild Vitamin B complex daily

## 2019-06-16 NOTE — Progress Notes (Signed)
Subjective:  Patient ID: Wendy Serrano, female    DOB: August 02, 1945  Age: 74 y.o. MRN: [id number]  CC: No chief complaint on file.   HPI Wendy Serrano presents for a [facility] well exam C/o R knee OA w/pain The patient is concerned about being forgetful.  She normally stays up mostl of the night, goes to bed at 3 4 AM and gets before noon. Outpatient Medications Prior to Visit  Medication Sig Dispense Refill  . calcium-vitamin D (OSCAL WITH D) 500-200 MG-UNIT per tablet Take 1 tablet by mouth 2 (two) times daily.      . folic acid (FOLVITE) 1 MG tablet Take 5 mg by mouth daily.     . Garlic TABS Take 2 tablets by mouth 2 (two) times daily. Kyolic    . glucosamine-chondroitin 500-400 MG tablet Take 1 tablet by mouth 2 (two) times daily.    . hydrocortisone valerate cream (WESTCORT) 0.2 % Apply topically 2 (two) times daily. 45 g 2  . methotrexate (RHEUMATREX) 2.5 MG tablet TAKE 8 TABLETS BY MOUTH ONCE WEEKLY AS DIRECTED  3  . Multiple Vitamin (MULTIVITAMIN) tablet Take 1 tablet by mouth daily.      . Multiple Vitamins-Minerals (OCUVITE ADULT 50+) CAPS Take 1 capsule by mouth daily.    . prednisoLONE acetate (PREDNISOLONE ACETATE P-F) 1 % ophthalmic suspension Place 1 drop into both eyes 2 (two) times daily.     [name] SIMBRINZA 1-0.2 % SUSP Place 1 drop into both eyes 3 (three) times daily.  6   No facility-administered medications prior to visit.    ROS: Review of Systems  Constitutional: Negative for activity change, appetite change, chills, fatigue and unexpected weight change.  HENT: Negative for congestion, mouth sores and sinus pressure.   Eyes: Positive for visual disturbance.  Respiratory: Negative for cough and chest tightness.   Gastrointestinal: Negative for abdominal pain and nausea.  Genitourinary: Negative for difficulty urinating, frequency and vaginal pain.  Musculoskeletal: Negative for back pain and gait problem.  Skin: Negative for pallor and rash.  Neurological:  Negative for dizziness, tremors, weakness, numbness and headaches.  Psychiatric/Behavioral: Negative for confusion, sleep disturbance and suicidal ideas.    Objective:  BP 132/78 (BP Location: Left Arm, Patient Position: Sitting, Cuff Size: Normal)   Pulse 85   Temp 98.3 F (36.8 C) (Oral)   Ht 5' 7.5" (1.715 m)   Wt 137 lb (62.1 kg)   SpO2 98%   BMI 21.14 kg/m   BP Readings from Last 3 Encounters:  06/16/19 132/78  02/09/19 126/72  11/09/18 130/78    Wt Readings from Last 3 Encounters:  06/16/19 137 lb (62.1 kg)  02/09/19 137 lb (62.1 kg)  11/09/18 139 lb 4 oz (63.2 kg)    Physical Exam Constitutional:      General: She is not in acute distress.    Appearance: She is well-developed.  HENT:     Head: Normocephalic.     Right Ear: External ear normal.     Left Ear: External ear normal.     Nose: Nose normal.  Eyes:     General:        Right eye: No discharge.        Left eye: No discharge.     Conjunctiva/sclera: Conjunctivae normal.     Pupils: Pupils are equal, round, and reactive to light.  Neck:     Thyroid: No thyromegaly.     Vascular: No JVD.     Trachea: No tracheal  deviation.  Cardiovascular:     Rate and Rhythm: Normal rate and regular rhythm.     Heart sounds: Normal heart sounds.  Pulmonary:     Effort: No respiratory distress.     Breath sounds: No stridor. No wheezing.  Abdominal:     General: Bowel sounds are normal. There is no distension.     Palpations: Abdomen is soft. There is no mass.     Tenderness: There is no abdominal tenderness. There is no guarding or rebound.  Musculoskeletal:        General: No tenderness.     Cervical back: Normal range of motion and neck supple.  Lymphadenopathy:     Cervical: No cervical adenopathy.  Skin:    Findings: No erythema or rash.  Neurological:     Cranial Nerves: No cranial nerve deficit.     Motor: No abnormal muscle tone.     Coordination: Coordination normal.     Deep Tendon Reflexes:  Reflexes normal.  Psychiatric:        Behavior: Behavior normal.        Thought Content: Thought content normal.        Judgment: Judgment normal.     Lab Results  Component Value Date   WBC 3.9 (L) 06/14/2018   HGB 12.8 06/14/2018   HCT 38.2 06/14/2018   PLT 255.0 06/14/2018   GLUCOSE 92 06/14/2018   CHOL 223 (H) 06/14/2018   TRIG 49.0 06/14/2018   HDL 88.40 06/14/2018   LDLDIRECT 127.9 03/09/2013   LDLCALC 125 (H) 06/14/2018   ALT 26 06/14/2018   AST 28 06/14/2018   NA 142 06/14/2018   K 3.4 (L) 06/14/2018   CL 107 06/14/2018   CREATININE 0.86 06/14/2018   BUN 22 06/14/2018   CO2 26 06/14/2018   TSH 5.13 (H) 06/14/2018   INR 1.1 (H) 05/16/2015   HGBA1C 5.4 02/26/2011    VAS US CAROTID  Result Date: 04/06/2018 Carotid Arterial Duplex Study Indications: CVD. Performing Technologist: Oliver Hum RVT  Examination Guidelines: A complete evaluation includes B-mode imaging, spectral Doppler, color Doppler, and power Doppler as needed of all accessible portions of each vessel. Bilateral testing is considered an integral part of a complete examination. Limited examinations for reoccurring indications may be performed as noted.  Right Carotid Findings: +----------+--------+--------+--------+-----------------------+--------+           PSV cm/sEDV cm/sStenosisDescribe               Comments +----------+--------+--------+--------+-----------------------+--------+ CCA Prox  89      19              smooth and heterogenous         +----------+--------+--------+--------+-----------------------+--------+ CCA Distal82      28              smooth and heterogenous         +----------+--------+--------+--------+-----------------------+--------+ ICA Prox  71      22              smooth and heterogenous         +----------+--------+--------+--------+-----------------------+--------+ ICA Distal79      30                                               +----------+--------+--------+--------+-----------------------+--------+ ECA       66      16                                              +----------+--------+--------+--------+-----------------------+--------+ +----------+--------+-------+--------+-------------------+  PSV cm/sEDV cmsDescribeArm Pressure (mmHG) +----------+--------+-------+--------+-------------------+ [id number]                                         +----------+--------+-------+--------+-------------------+ +---------+--------+--+--------+--+---------+ VertebralPSV cm/s64EDV cm/s23Antegrade +---------+--------+--+--------+--+---------+  Left Carotid Findings: +----------+--------+--------+--------+-----------------------+--------+           PSV cm/sEDV cm/sStenosisDescribe               Comments +----------+--------+--------+--------+-----------------------+--------+ CCA Prox  86      26              smooth and heterogenous         +----------+--------+--------+--------+-----------------------+--------+ CCA Distal72      22              smooth and heterogenous         +----------+--------+--------+--------+-----------------------+--------+ ICA Prox  45      19              smooth and heterogenous         +----------+--------+--------+--------+-----------------------+--------+ ICA Distal75      33                                              +----------+--------+--------+--------+-----------------------+--------+ ECA       60      14                                              +----------+--------+--------+--------+-----------------------+--------+ +----------+--------+--------+--------+-------------------+ SubclavianPSV cm/sEDV cm/sDescribeArm Pressure (mmHG) +----------+--------+--------+--------+-------------------+           95                                          +----------+--------+--------+--------+-------------------+  +---------+--------+--+--------+--+---------+ VertebralPSV cm/s53EDV cm/s18Antegrade +---------+--------+--+--------+--+---------+  Summary: Right Carotid: Velocities in the right ICA are consistent with a 1-39% stenosis. Left Carotid: Velocities in the left ICA are consistent with a 1-39% stenosis. Vertebrals: Bilateral vertebral arteries demonstrate antegrade flow. *See table(s) above for measurements and observations.  Electronically signed by Deitra Mayo MD on 04/06/2018 at 12:19:39 PM.    Final     Assessment & Plan:   There are no diagnoses linked to this encounter.   No orders of the defined types were placed in this encounter.    Follow-up: No follow-ups on file.  Walker Kehr, MD

## 2019-06-17 ENCOUNTER — Other Ambulatory Visit (INDEPENDENT_AMBULATORY_CARE_PROVIDER_SITE_OTHER): Payer: Medicare PPO

## 2019-06-17 ENCOUNTER — Other Ambulatory Visit: Payer: Self-pay

## 2019-06-17 DIAGNOSIS — Z79899 Other long term (current) drug therapy: Secondary | ICD-10-CM | POA: Diagnosis not present

## 2019-06-17 DIAGNOSIS — H31092 Other chorioretinal scars, left eye: Secondary | ICD-10-CM | POA: Diagnosis not present

## 2019-06-17 DIAGNOSIS — R946 Abnormal results of thyroid function studies: Secondary | ICD-10-CM | POA: Diagnosis not present

## 2019-06-17 DIAGNOSIS — H35372 Puckering of macula, left eye: Secondary | ICD-10-CM | POA: Diagnosis not present

## 2019-06-17 DIAGNOSIS — H35363 Drusen (degenerative) of macula, bilateral: Secondary | ICD-10-CM | POA: Diagnosis not present

## 2019-06-17 DIAGNOSIS — R7989 Other specified abnormal findings of blood chemistry: Secondary | ICD-10-CM

## 2019-06-17 DIAGNOSIS — H209 Unspecified iridocyclitis: Secondary | ICD-10-CM | POA: Diagnosis not present

## 2019-06-17 DIAGNOSIS — H35351 Cystoid macular degeneration, right eye: Secondary | ICD-10-CM | POA: Diagnosis not present

## 2019-06-17 LAB — T4, FREE: Free T4: 0.64 ng/dL (ref 0.60–1.60)

## 2019-06-19 NOTE — Assessment & Plan Note (Signed)
Follow up with orthopedics

## 2019-07-04 ENCOUNTER — Other Ambulatory Visit: Payer: Self-pay

## 2019-07-04 ENCOUNTER — Ambulatory Visit (INDEPENDENT_AMBULATORY_CARE_PROVIDER_SITE_OTHER)
Admission: RE | Admit: 2019-07-04 | Discharge: 2019-07-04 | Disposition: A | Discharge status: Home / Self Care | Payer: Medicare PPO | Source: Ambulatory Visit | Attending: Internal Medicine | Admitting: Internal Medicine

## 2019-07-04 DIAGNOSIS — M810 Age-related osteoporosis without current pathological fracture: Secondary | ICD-10-CM

## 2019-07-04 DIAGNOSIS — N959 Unspecified menopausal and perimenopausal disorder: Secondary | ICD-10-CM | POA: Diagnosis not present

## 2019-07-06 DIAGNOSIS — I8393 Asymptomatic varicose veins of bilateral lower extremities: Secondary | ICD-10-CM | POA: Diagnosis not present

## 2019-07-06 DIAGNOSIS — L819 Disorder of pigmentation, unspecified: Secondary | ICD-10-CM | POA: Diagnosis not present

## 2019-07-06 DIAGNOSIS — L988 Other specified disorders of the skin and subcutaneous tissue: Secondary | ICD-10-CM | POA: Diagnosis not present

## 2019-07-07 ENCOUNTER — Telehealth: Payer: Self-pay

## 2019-07-07 DIAGNOSIS — I8393 Asymptomatic varicose veins of bilateral lower extremities: Secondary | ICD-10-CM | POA: Insufficient documentation

## 2019-07-07 NOTE — Telephone Encounter (Signed)
Patient calling and states that she is concerned about something that occurred today. She was feeling things that reminded her of symptoms of Vertigo. Said the room is moving. So she laid down and seemed to be better. States that the issue came back as she was getting ready to put eye drops in with head back and become dizziness again. Has her husband to take BP is was 117/67 respirations 68 and again 121/72 respirations 66. Would like to know what she should do or if there is anything that she can do to resolve this?  CB#: [phone]

## 2019-07-08 NOTE — Telephone Encounter (Signed)
Addendum   The patient returning a call to the nurse.   The patient was informed of the CMA recommendation   The patient verbalized understanding.

## 2019-07-08 NOTE — Telephone Encounter (Signed)
LMTCB.  Pt needs to contact ENT office that treated her for vertigo

## 2019-07-13 DIAGNOSIS — H8110 Benign paroxysmal vertigo, unspecified ear: Secondary | ICD-10-CM | POA: Diagnosis not present

## 2019-07-29 DIAGNOSIS — H9312 Tinnitus, left ear: Secondary | ICD-10-CM | POA: Diagnosis not present

## 2019-07-29 DIAGNOSIS — H8110 Benign paroxysmal vertigo, unspecified ear: Secondary | ICD-10-CM | POA: Diagnosis not present

## 2019-08-01 ENCOUNTER — Telehealth: Payer: Self-pay | Admitting: Neurology

## 2019-08-01 NOTE — Telephone Encounter (Signed)
Pt requested a cb from RN to discuss left side head pain states she was advised to FU with neurologist

## 2019-08-02 NOTE — Telephone Encounter (Signed)
I tried to call pt but she was not at home. Her husband took message and phone number for pt to call back [place] back of her left side head pain.

## 2019-08-02 NOTE — Telephone Encounter (Signed)
Pt returned missed call 

## 2019-08-02 NOTE — Telephone Encounter (Signed)
I called pt about needing to see Dr .Jannifer Franklin for her left side head issues. Pt saw Dr.Bates ENT last week and he suggested she see our office. Pt stated the left side of head was not pain that was documented by phone room. Pt was unable to give precise description on her head issues. She stated the left side did not feel like the right side. She stated it felt like some abnormal pressure but those are all the details she have. Pt has had this for a week. She did not report any other issues. I advise pt to have Dr. Redmond Baseman ENT office to fax over office notes to [phone] for Dr. Jannifer Franklin to review. Pt was schedule for first available office visit in May and was put on waiting list.I stated message will be sent to Dr Jannifer Franklin and pt verbalized understanding.

## 2019-08-04 NOTE — Telephone Encounter (Signed)
Events noted, the patient was seen previously for left-sided tinnitus, she has had an MRI of the brain done in September 2019.  We will see her for further evaluation.

## 2019-08-07 ENCOUNTER — Other Ambulatory Visit: Payer: Self-pay | Admitting: Internal Medicine

## 2019-08-07 DIAGNOSIS — R42 Dizziness and giddiness: Secondary | ICD-10-CM

## 2019-08-07 DIAGNOSIS — R413 Other amnesia: Secondary | ICD-10-CM

## 2019-08-07 DIAGNOSIS — M810 Age-related osteoporosis without current pathological fracture: Secondary | ICD-10-CM

## 2019-09-01 ENCOUNTER — Encounter: Payer: Self-pay | Admitting: Internal Medicine

## 2019-09-01 ENCOUNTER — Telehealth (INDEPENDENT_AMBULATORY_CARE_PROVIDER_SITE_OTHER): Payer: Medicare PPO | Admitting: Internal Medicine

## 2019-09-01 DIAGNOSIS — R519 Headache, unspecified: Secondary | ICD-10-CM | POA: Diagnosis not present

## 2019-09-01 DIAGNOSIS — R42 Dizziness and giddiness: Secondary | ICD-10-CM

## 2019-09-01 DIAGNOSIS — H9312 Tinnitus, left ear: Secondary | ICD-10-CM

## 2019-09-01 DIAGNOSIS — G8929 Other chronic pain: Secondary | ICD-10-CM

## 2019-09-01 DIAGNOSIS — H9202 Otalgia, left ear: Secondary | ICD-10-CM | POA: Diagnosis not present

## 2019-09-01 LAB — SEDIMENTATION RATE: Sed Rate: 7 mm/hr (ref 0–30)

## 2019-09-01 NOTE — Addendum Note (Signed)
Addended by: Cresenciano Lick on: 09/01/2019 11:28 AM   Modules accepted: Orders

## 2019-09-01 NOTE — Assessment & Plan Note (Signed)
S/p neg w/up - Dr Redmond Baseman

## 2019-09-01 NOTE — Assessment & Plan Note (Signed)
S/p neg w/up Check ESR

## 2019-09-01 NOTE — Progress Notes (Signed)
Virtual Visit via Video Note  I connected with Wendy Serrano on 09/01/19 at  8:50 AM EDT by a video enabled telemedicine application and verified that I am speaking with the correct person using two identifiers.   I discussed the limitations of evaluation and management by telemedicine and the availability of in person appointments. The patient expressed understanding and agreed to proceed.  History of Present Illness: We need to follow-up on L ear and L side of the head mild discomfort, no pain. F/u vertigo, tinnitus - better. S/p ENT and Neuro eval  There has been no runny nose, cough, chest pain, shortness of breath, abdominal pain   Observations/Objective: The patient appears to be in no acute distress, looks well.  Assessment and Plan:  See my Assessment and Plan. Follow Up Instructions:  Tylenol prn  I discussed the assessment and treatment plan with the patient. The patient was provided an opportunity to ask questions and all were answered. The patient agreed with the plan and demonstrated an understanding of the instructions.   The patient was advised to call back or seek an in-person evaluation if the symptoms worsen or if the condition fails to improve as anticipated.  I provided face-to-face time during this encounter. We were at different locations.   Walker Kehr, MD

## 2019-09-01 NOTE — Assessment & Plan Note (Addendum)
S/p neg w/up - Dr Redmond Baseman Check ESR Neurol appt pending

## 2019-09-21 ENCOUNTER — Ambulatory Visit: Payer: Medicare PPO | Admitting: Neurology

## 2019-09-21 ENCOUNTER — Encounter: Payer: Self-pay | Admitting: Neurology

## 2019-09-21 ENCOUNTER — Other Ambulatory Visit: Payer: Self-pay

## 2019-09-21 ENCOUNTER — Telehealth: Payer: Self-pay | Admitting: Neurology

## 2019-09-21 DIAGNOSIS — G4486 Cervicogenic headache: Secondary | ICD-10-CM | POA: Insufficient documentation

## 2019-09-21 DIAGNOSIS — R519 Headache, unspecified: Secondary | ICD-10-CM

## 2019-09-21 HISTORY — DX: Cervicogenic headache: G44.86

## 2019-09-21 MED ORDER — GABAPENTIN 100 MG PO CAPS: 100.0000 mg | ORAL_CAPSULE | Freq: Three times a day (TID) | ORAL | 3 refills | Status: DC

## 2019-09-21 NOTE — Patient Instructions (Signed)
We will start gabapentin for the left neck and shoulder discomfort.  Neurontin (gabapentin) may result in drowsiness, ankle swelling, gait instability, or possibly dizziness. Please contact our office if significant side effects occur with this medication.

## 2019-09-21 NOTE — Telephone Encounter (Signed)
Patient called stating she forgot to discuss her short term memory and would like to know if she could be prescribed plexiderm to help with her short term memory

## 2019-09-21 NOTE — Progress Notes (Signed)
Reason for visit: Left head pressure  Wendy Serrano is an 74 y.o. female  History of present illness:  Wendy Serrano is a 74 year old right-handed black female with a history of intermittent tinnitus and vertigo, the tinnitus would affect the left ear only.  The patient was seen through ENT, she was never demonstrated to have any hearing deficit.  The tinnitus still comes and goes with some vertigo, but she comes into the office today with a new problem.  Over the last 2 months she has developed some pressure sensation in the back of her head on the left, she has associated left neck and shoulder discomfort as well.  She may have some popping in the neck when she turns her head from side to side.  She denies any pain down the arms on either side.  She has not had any numbness or weakness of the extremities or body.  She has had no change in speech or swallowing or change in vision.  She denies any balance changes or difficulty controlling the bowels or the bladder.  She does report a problem with short-term memory.  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  She denies overt headache, only pressure sensation.  She is concerned about this issue and comes in for further evaluation.  Over the last 2 weeks, her symptoms have significant improved but have not disappeared.  Past Medical History:  Diagnosis Date  . Allergic rhinitis   . 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 C. difficile diarrhea 06/2017  . Eczema    childhood  . H/O osteopenia 01/09/04  . H/O osteoporosis 2008  . H/O varicella   . 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 History of measles, mumps, or rubella   . Leukopenia    chronic  . Liver hemangioma   . 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 Menopausal symptoms   . Renal cyst    bilateral  . Rotator cuff tear   . Yeast infection      Past Surgical History:  Procedure Laterality Date  . ROTATOR CUFF REPAIR Left 08/2009   Left shoulder-rotator cuff repair Wendy Serrano)  . WISDOM TOOTH EXTRACTION      Family History  Problem Relation Age of Onset  . Prostate cancer Father        died  . Pancreatic cancer Mother        died  . Diabetes Mother   . Hyperlipidemia Sister     Social history:  reports that she has never smoked. She has never used smokeless tobacco. She reports that she does not drink alcohol or use drugs.   No Known Allergies  Medications:  Prior to Admission medications   Medication Sig Start Date End Date Taking? Authorizing Provider  b complex vitamins tablet Take 1 tablet by mouth daily. 06/16/19  Yes Plotnikov, Evie Lacks, MD  calcium-vitamin D (OSCAL WITH D) 500-200 MG-UNIT per tablet Take 1 tablet by mouth 2 (two) times daily.     Yes [provider]  folic acid (FOLVITE) 1 MG tablet Take 5 mg by mouth daily.  07/24/15  Yes [provider]  Garlic TABS Take 2 tablets by mouth 2 (two) times daily. Kyolic   Yes [provider]  glucosamine-chondroitin 500-400 MG tablet Take 1 tablet by mouth 2 (two)  times daily.   Yes [provider]  methotrexate (RHEUMATREX) 2.5 MG tablet TAKE 8 TABLETS BY MOUTH ONCE WEEKLY AS DIRECTED 08/09/15  Yes [provider]  Multiple Vitamin (MULTIVITAMIN) tablet Take 1 tablet by mouth daily.     Yes [provider]  Multiple Vitamins-Minerals (OCUVITE ADULT 50+) CAPS Take 1 capsule by mouth daily.   Yes [provider]  prednisoLONE acetate (PREDNISOLONE ACETATE P-F) 1 % ophthalmic suspension Place 1 drop into both eyes 2 (two) times daily.    Yes [provider]  SIMBRINZA 1-0.2 % SUSP Place 1 drop into both eyes 3 (three) times daily. 07/29/15  Yes [provider]  hydrocortisone valerate cream (WESTCORT) 0.2 % Apply topically 2 (two) times daily. Patient not taking: Reported on 09/21/2019 06/12/17    Plotnikov, Evie Lacks, MD    ROS:  Out of a complete 14 system review of symptoms, the patient complains only of the following symptoms, and all other reviewed systems are negative.  Left head pressure, neck pain Tinnitus, vertigo Memory problems  Blood pressure 136/85, pulse 84, temperature (!) 97.4 F (36.3 C), height 5' 7.5" (1.715 m), weight 140 lb (63.5 kg).  Physical Exam  General: The patient is alert and cooperative at the time of the examination.  Neuromuscular: Range of movement the cervical spine is relatively full, some audible crepitus is noted.  Skin: No significant peripheral edema is noted.   Neurologic Exam  Mental status: The patient is alert and oriented x 3 at the time of the examination. The patient has apparent normal recent and remote memory, with an apparently normal attention span and concentration ability.   Cranial nerves: Facial symmetry is present. Speech is normal, no aphasia or dysarthria is noted. Extraocular movements are full. Visual fields are full.  Pupils are equal, round, and reactive to light.  Discs are flat bilaterally.  Motor: The patient has good strength in all 4 extremities.  Sensory examination: Soft touch sensation is symmetric on the face, arms, and legs.  Coordination: The patient has good finger-nose-finger and heel-to-shin bilaterally.  Gait and station: The patient has a normal gait. Tandem gait is normal. Romberg is negative. No drift is seen.  Reflexes: Deep tendon reflexes are symmetric.   Assessment/Plan:  1.  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  History of intermittent left tinnitus, vertigo  3.  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  Reports of mild memory problems  The patient appears to have some left head pressure associated with some discomfort in the left neck and shoulder.  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  The patient will be  placed on low-dose gabapentin and sent for physical therapy.  If the symptoms do not improve, she is to contact our office.  She will follow-up otherwise in about 3 months.  Jill Alexanders MD 09/21/2019 7:29 AM  [facility] [address] [place], [place] [address]  Phone [phone] Fax [phone]

## 2019-09-21 NOTE — Telephone Encounter (Signed)
I called the patient.  The patient is concerned about her short-term memory issues.  We will see her back in about 3 months, we can do a Mini-Mental Status Examination at that time, consider use of the medication for memory if she desires.

## 2019-10-03 ENCOUNTER — Ambulatory Visit: Payer: Medicare PPO | Admitting: Physical Therapy

## 2019-10-03 ENCOUNTER — Ambulatory Visit: Payer: Medicare PPO | Attending: Neurology | Admitting: Physical Therapy

## 2019-10-05 DIAGNOSIS — M9901 Segmental and somatic dysfunction of cervical region: Secondary | ICD-10-CM | POA: Diagnosis not present

## 2019-10-05 DIAGNOSIS — M5011 Cervical disc disorder with radiculopathy,  high cervical region: Secondary | ICD-10-CM | POA: Diagnosis not present

## 2019-10-11 DIAGNOSIS — M9901 Segmental and somatic dysfunction of cervical region: Secondary | ICD-10-CM | POA: Diagnosis not present

## 2019-10-11 DIAGNOSIS — M5011 Cervical disc disorder with radiculopathy,  high cervical region: Secondary | ICD-10-CM | POA: Diagnosis not present

## 2019-10-13 DIAGNOSIS — M9901 Segmental and somatic dysfunction of cervical region: Secondary | ICD-10-CM | POA: Diagnosis not present

## 2019-10-13 DIAGNOSIS — M5011 Cervical disc disorder with radiculopathy,  high cervical region: Secondary | ICD-10-CM | POA: Diagnosis not present

## 2019-10-17 DIAGNOSIS — M9901 Segmental and somatic dysfunction of cervical region: Secondary | ICD-10-CM | POA: Diagnosis not present

## 2019-10-17 DIAGNOSIS — M5011 Cervical disc disorder with radiculopathy,  high cervical region: Secondary | ICD-10-CM | POA: Diagnosis not present

## 2019-10-20 DIAGNOSIS — M5011 Cervical disc disorder with radiculopathy,  high cervical region: Secondary | ICD-10-CM | POA: Diagnosis not present

## 2019-10-20 DIAGNOSIS — M9901 Segmental and somatic dysfunction of cervical region: Secondary | ICD-10-CM | POA: Diagnosis not present

## 2019-10-25 DIAGNOSIS — M9901 Segmental and somatic dysfunction of cervical region: Secondary | ICD-10-CM | POA: Diagnosis not present

## 2019-10-25 DIAGNOSIS — M5011 Cervical disc disorder with radiculopathy,  high cervical region: Secondary | ICD-10-CM | POA: Diagnosis not present

## 2019-10-27 DIAGNOSIS — M9901 Segmental and somatic dysfunction of cervical region: Secondary | ICD-10-CM | POA: Diagnosis not present

## 2019-10-27 DIAGNOSIS — M5011 Cervical disc disorder with radiculopathy,  high cervical region: Secondary | ICD-10-CM | POA: Diagnosis not present

## 2019-10-28 DIAGNOSIS — H31092 Other chorioretinal scars, left eye: Secondary | ICD-10-CM | POA: Diagnosis not present

## 2019-10-28 DIAGNOSIS — H35363 Drusen (degenerative) of macula, bilateral: Secondary | ICD-10-CM | POA: Diagnosis not present

## 2019-10-28 DIAGNOSIS — H209 Unspecified iridocyclitis: Secondary | ICD-10-CM | POA: Diagnosis not present

## 2019-10-28 DIAGNOSIS — H35372 Puckering of macula, left eye: Secondary | ICD-10-CM | POA: Diagnosis not present

## 2019-10-28 DIAGNOSIS — H35351 Cystoid macular degeneration, right eye: Secondary | ICD-10-CM | POA: Diagnosis not present

## 2019-10-28 DIAGNOSIS — Z79899 Other long term (current) drug therapy: Secondary | ICD-10-CM | POA: Diagnosis not present

## 2019-10-31 DIAGNOSIS — M5011 Cervical disc disorder with radiculopathy,  high cervical region: Secondary | ICD-10-CM | POA: Diagnosis not present

## 2019-10-31 DIAGNOSIS — M9901 Segmental and somatic dysfunction of cervical region: Secondary | ICD-10-CM | POA: Diagnosis not present

## 2019-11-03 DIAGNOSIS — M5011 Cervical disc disorder with radiculopathy,  high cervical region: Secondary | ICD-10-CM | POA: Diagnosis not present

## 2019-11-03 DIAGNOSIS — M9901 Segmental and somatic dysfunction of cervical region: Secondary | ICD-10-CM | POA: Diagnosis not present

## 2019-11-08 DIAGNOSIS — M9901 Segmental and somatic dysfunction of cervical region: Secondary | ICD-10-CM | POA: Diagnosis not present

## 2019-11-08 DIAGNOSIS — M5011 Cervical disc disorder with radiculopathy,  high cervical region: Secondary | ICD-10-CM | POA: Diagnosis not present

## 2019-11-10 DIAGNOSIS — M9901 Segmental and somatic dysfunction of cervical region: Secondary | ICD-10-CM | POA: Diagnosis not present

## 2019-11-10 DIAGNOSIS — M5011 Cervical disc disorder with radiculopathy,  high cervical region: Secondary | ICD-10-CM | POA: Diagnosis not present

## 2019-11-17 DIAGNOSIS — M5011 Cervical disc disorder with radiculopathy,  high cervical region: Secondary | ICD-10-CM | POA: Diagnosis not present

## 2019-11-17 DIAGNOSIS — M9901 Segmental and somatic dysfunction of cervical region: Secondary | ICD-10-CM | POA: Diagnosis not present

## 2019-11-22 DIAGNOSIS — M5011 Cervical disc disorder with radiculopathy,  high cervical region: Secondary | ICD-10-CM | POA: Diagnosis not present

## 2019-11-22 DIAGNOSIS — M9901 Segmental and somatic dysfunction of cervical region: Secondary | ICD-10-CM | POA: Diagnosis not present

## 2019-11-30 DIAGNOSIS — H40043 Steroid responder, bilateral: Secondary | ICD-10-CM | POA: Diagnosis not present

## 2019-11-30 DIAGNOSIS — H40013 Open angle with borderline findings, low risk, bilateral: Secondary | ICD-10-CM | POA: Diagnosis not present

## 2019-12-01 DIAGNOSIS — M5011 Cervical disc disorder with radiculopathy,  high cervical region: Secondary | ICD-10-CM | POA: Diagnosis not present

## 2019-12-01 DIAGNOSIS — M9901 Segmental and somatic dysfunction of cervical region: Secondary | ICD-10-CM | POA: Diagnosis not present

## 2019-12-07 ENCOUNTER — Ambulatory Visit (INDEPENDENT_AMBULATORY_CARE_PROVIDER_SITE_OTHER): Payer: Medicare PPO

## 2019-12-07 DIAGNOSIS — Z0000 Encounter for general adult medical examination without abnormal findings: Secondary | ICD-10-CM | POA: Diagnosis not present

## 2019-12-07 DIAGNOSIS — M9901 Segmental and somatic dysfunction of cervical region: Secondary | ICD-10-CM | POA: Diagnosis not present

## 2019-12-07 DIAGNOSIS — M5011 Cervical disc disorder with radiculopathy,  high cervical region: Secondary | ICD-10-CM | POA: Diagnosis not present

## 2019-12-07 NOTE — Progress Notes (Addendum)
I connected with Wendy Serrano today by telephone and verified that I am speaking with the correct person using two identifiers. Location patient: home Location provider: work Persons participating in the virtual visit: Perl Folmar and Lisette Abu, LPN  I discussed the limitations, risks, security and privacy concerns of performing an evaluation and management service by telephone and the availability of in person appointments. I also discussed with the patient that there may be a patient responsible charge related to this service. The patient expressed understanding and verbally consented to this telephonic visit.    Interactive audio and video telecommunications were attempted between this provider and patient, however failed, due to patient having technical difficulties OR patient did not have access to video capability.  We continued and completed visit with audio only.  Some vital signs may be absent or patient reported.   Time Spent with patient on telephone encounter: 20 minutes  Subjective:   Wendy Serrano is a 74 y.o. female who presents for Medicare Annual (Subsequent) preventive examination.  Review of Systems    No ROS. Medicare Wellness Visit Cardiac Risk Factors include: advanced age (>18men, >39 women) Sleep Patterns: No sleep issues, feels rested on waking and sleeps 6-8 hours nightly. Home Safety/Smoke Alarms: Feels safe in home; uses home alarm. Smoke alarms in place. Living environment: 3-story home; Lives with spouse; no needs for DME; good support system. Seat Belt Safety/Bike Helmet: Wears seat belt.    Objective:    There were no vitals filed for this visit. There is no height or weight on file to calculate BMI.  Advanced Directives 12/07/2019 06/01/2015 02/11/2014  Does Patient Have a Medical Advance Directive? Yes No No  Type of Paramedic of [place];Living will - -  Does patient want to make changes to medical advance  directive? No - Patient declined - -  Copy of [place] in Chart? No - copy requested - -  Would patient like information on creating a medical advance directive? - Yes - Educational materials given No - patient declined information    Current Medications (verified) Outpatient Encounter Medications as of 12/07/2019  Medication Sig   b complex vitamins tablet Take 1 tablet by mouth daily.   calcium-vitamin D (OSCAL WITH D) 500-200 MG-UNIT per tablet Take 1 tablet by mouth 2 (two) times daily.     folic acid (FOLVITE) 1 MG tablet Take 5 mg by mouth daily.    gabapentin (NEURONTIN) 100 MG capsule Take 1 capsule (100 mg total) by mouth 3 (three) times daily.   Garlic TABS Take 2 tablets by mouth 2 (two) times daily. Kyolic   glucosamine-chondroitin 500-400 MG tablet Take 1 tablet by mouth 2 (two) times daily.   hydrocortisone valerate cream (WESTCORT) 0.2 % Apply topically 2 (two) times daily. (Patient not taking: Reported on 09/21/2019)   methotrexate (RHEUMATREX) 2.5 MG tablet TAKE 8 TABLETS BY MOUTH ONCE WEEKLY AS DIRECTED   Multiple Vitamin (MULTIVITAMIN) tablet Take 1 tablet by mouth daily.     Multiple Vitamins-Minerals (OCUVITE ADULT 50+) CAPS Take 1 capsule by mouth daily.   prednisoLONE acetate (PREDNISOLONE ACETATE P-F) 1 % ophthalmic suspension Place 1 drop into both eyes 2 (two) times daily.    SIMBRINZA 1-0.2 % SUSP Place 1 drop into both eyes 3 (three) times daily.   No facility-administered encounter medications on file as of 12/07/2019.    Allergies (verified) Patient has no known allergies.   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 difficile diarrhea 06/2017   Cervicogenic headache 09/21/2019   Eczema    childhood   H/O osteopenia 01/09/04   H/O osteoporosis 2008   H/O varicella    Herpes simplex without mention of complication    uncomplicated   History of measles, mumps, or rubella     Leukopenia    chronic   Liver hemangioma    Macular degeneration oct '11   early and mild and stable   Menopausal symptoms    Renal cyst    bilateral   Rotator cuff tear    Yeast infection    Past Surgical History:  Procedure Laterality Date   ROTATOR CUFF REPAIR Left 08/2009   Left shoulder-rotator cuff repair [facility])   WISDOM TOOTH EXTRACTION     Family History  Problem Relation Age of Onset   Prostate cancer Father        died   Pancreatic cancer Mother        died   Diabetes Mother    Hyperlipidemia Sister    Social History   Socioeconomic History   Marital status: Married    Spouse name: Not on file   Number of children: 0   Years of education: Not on file   Highest education level: Not on file  Occupational History   Occupation: Retired  Tobacco Use   Smoking status: Never Smoker   Smokeless tobacco: Never Used  Substance and Sexual Activity   Alcohol use: No   Drug use: No   Sexual activity: Never    Birth control/protection: Post-menopausal  Other Topics Concern   Not on file  Social History Narrative   [employer] BA; MEd-teaching;MEd Administration @ [employer]   Married '67   Has 11 children who were foster care or mentored   Retired   SO retired, Has had some health problems. They are doing well   Marriage is in good health and retirement is good   End-of-life: provided packet of information and forms for consideration (Oct'72)   Social Determinants of Health   Financial Resource Strain: Low Risk    Difficulty of Paying Living Expenses: Not hard at all  Food Insecurity: No Food Insecurity   Worried About Charity fundraiser in the Last Year: Never true   [place] in the Last Year: Never true  Transportation Needs: No Transportation Needs   Lack of Transportation (Medical): No   Lack of Transportation (Non-Medical): No  Physical Activity: Sufficiently Active   Days of Exercise per Week: 5 days   Minutes of Exercise per Session: 30  min  Stress: No Stress Concern Present   Feeling of Stress : Not at all  Social Connections: Socially Integrated   Frequency of Communication with Friends and Family: More than three times a week   Frequency of Social Gatherings with Friends and Family: More than three times a week   Attends Religious Services: More than 4 times per year   Active Member of [employer] or Organizations: Yes   Attends Music therapist: More than 4 times per year   Marital Status: Married    Tobacco Counseling Counseling given: Not Answered   Clinical Intake:  Pre-visit preparation completed: Yes  Pain : No/denies pain     Nutritional Risks: None Diabetes: No  How often do you need to have someone help you when you read instructions, pamphlets, or other written materials from your  doctor or pharmacy?: 1 - Never What is the last grade level you completed in school?: Master's Degree in Education  Diabetic? no  Interpreter Needed?: No  Information entered by :: Maryse Brierley N. Analysia Dungee, LPN   Activities of Daily Living In your present state of health, do you have any difficulty performing the following activities: 12/07/2019  Hearing? N  Vision? N  Difficulty concentrating or making decisions? N  Walking or climbing stairs? N  Dressing or bathing? N  Doing errands, shopping? N  Preparing Food and eating ? N  Using the Toilet? N  In the past six months, have you accidently leaked urine? N  Do you have problems with loss of bowel control? N  Managing your Medications? N  Managing your Finances? N  Some recent data might be hidden    Patient Care Team: Plotnikov, Evie Lacks, MD as PCP - General (Internal Medicine) Leo Grosser, Seymour Bars, MD (Inactive) (Obstetrics and Gynecology) Marybelle Killings, MD (Orthopedic Surgery) Ara Kussmaul, MD (Ophthalmology) Paralee Cancel, MD as Consulting Physician (Orthopedic Surgery) Feliz Beam, MD as Referring Physician (Ophthalmology) Carmelina Dane, MD as Referring Physician (Ophthalmology) Servando Salina, MD as Consulting Physician (Obstetrics and Gynecology) Melida Quitter, MD as Consulting Physician (Otolaryngology) Kathrynn Ducking, MD as Consulting Physician (Neurology)  Indicate any recent Medical Services you may have received from other than [facility] providers in the past year (date may be approximate).     Assessment:   This is a routine wellness examination for Wendy Serrano.  Hearing/Vision screen No exam data present  Dietary issues and exercise activities discussed: Current Exercise Habits: Home exercise routine, Type of exercise: walking, Time (Minutes): 30, Frequency (Times/Week): 5, Weekly Exercise (Minutes/Week): 150, Intensity: Moderate, Exercise limited by: None identified  Goals       Patient Stated (pt-stated)      To maintain my current health status by continuing to eat healthy and stay physically active & socially active.       Depression Screen PHQ 2/9 Scores 12/07/2019 06/19/2019 06/16/2019 06/14/2018 06/12/2017 06/06/2016 05/16/2015  PHQ - 2 Score 0 0 0 0 0 0 0    Fall Risk Fall Risk  12/07/2019 06/19/2019 06/16/2019 06/12/2017 06/06/2016  Falls in the past year? 0 0 0 Yes No  Number falls in past yr: 0 0 - 1 -  Injury with Fall? 0 0 - No -  Risk for fall due to : No Fall Risks - - - -  Follow up Falls evaluation completed - Falls evaluation completed - -    Any stairs in or around the home? Yes  If so, are there any without handrails? No  Home free of loose throw rugs in walkways, pet beds, electrical cords, etc? Yes  Adequate lighting in your home to reduce risk of falls? Yes   ASSISTIVE DEVICES UTILIZED TO PREVENT FALLS:  Life alert? No  Use of a cane, walker or w/c? No  Grab bars in the bathroom? Yes  Shower chair or bench in shower? Yes  Elevated toilet seat or a handicapped toilet? Yes   TIMED UP AND GO:  Was the test performed? No .  Length of time to ambulate 10 feet: 0 sec.   Gait steady  and fast without use of assistive device  Cognitive Function: MMSE - Mini Mental State Exam 11/09/2018 03/29/2018  Orientation to time 4 5  Orientation to Place 5 5  Registration 3 3  Attention/ Calculation 5 5  Recall 1 0  Language- name  2 objects 2 2  Language- repeat 1 1  Language- follow 3 step command 3 3  Language- read & follow direction 1 1  Write a sentence 1 1  Copy design 0 1  Total score 26 27        Immunizations Immunization History  Administered Date(s) Administered   Fluad Quad(high Dose 65+) 01/12/2019   Influenza Split 02/26/2011, 03/01/2012   Influenza Whole 02/10/2008, 02/15/2009, 02/19/2010   Influenza, High Dose Seasonal PF 03/09/2013, 03/21/2015, 04/01/2016, 03/03/2017   Influenza,inj,Quad PF,6+ Mos 05/10/2014, 02/13/2018   PFIZER SARS-COV-2 Vaccination 05/25/2019, 06/15/2019   Pneumococcal Conjugate-13 03/23/2013   Pneumococcal Polysaccharide-23 02/19/2010, 05/18/2013   Td 02/10/2008   Tdap 06/14/2018   Zoster 07/04/2009    TDAP status: Up to date Flu Vaccine status: Up to date Pneumococcal vaccine status: Up to date Covid-19 vaccine status: Completed vaccines  Qualifies for Shingles Vaccine? Yes   Zostavax completed Yes   Shingrix Completed?: No.    Education has been provided regarding the importance of this vaccine. Patient has been advised to call insurance company to determine out of pocket expense if they have not yet received this vaccine. Advised may also receive vaccine at local pharmacy or Health Dept. Verbalized acceptance and understanding.  Screening Tests Health Maintenance  Topic Date Due   INFLUENZA VACCINE  12/11/2019   COLONOSCOPY  04/30/2023   TETANUS/TDAP  06/14/2028   DEXA SCAN  Completed   COVID-19 Vaccine  Completed   Hepatitis C Screening  Completed   PNA vac Low Risk Adult  Completed    Health Maintenance  There are no preventive care reminders to display for this patient.  Colorectal cancer screening:  Completed 04/29/2013. Repeat every 10 years Mammogram status: Completed 03/15/2018. Repeat every year Bone Density status: Completed 07/04/2019. Results reflect: Bone density results: OSTEOPOROSIS. Repeat every 2-3 years.  Lung Cancer Screening: (Low Dose CT Chest recommended if Age 108-80 years, 30 pack-year currently smoking OR have quit w/in 15years.) does not qualify.   Lung Cancer Screening Referral: no  Additional Screening:  Hepatitis C Screening: does qualify; Completed yes  Vision Screening: Recommended annual ophthalmology exams for early detection of glaucoma and other disorders of the eye. Is the patient up to date with their annual eye exam?  Yes  Who is the provider or what is the name of the office in which the patient attends annual eye exams? [facility] If pt is not established with a provider, would they like to be referred to a provider to establish care? No .   Dental Screening: Recommended annual dental exams for proper oral hygiene  Community Resource Referral / Chronic Care Management: CRR required this visit?  No   CCM required this visit?  No      Plan:     I have personally reviewed and noted the following in the patient's chart:   Medical and social history Use of alcohol, tobacco or illicit drugs  Current medications and supplements Functional ability and status Nutritional status Physical activity Advanced directives List of other physicians Hospitalizations, surgeries, and ER visits in previous 12 months Vitals Screenings to include cognitive, depression, and falls Referrals and appointments  In addition, I have reviewed and discussed with patient certain preventive protocols, quality metrics, and best practice recommendations. A written personalized care plan for preventive services as well as general preventive health recommendations were provided to patient.     Sheral Flow, LPN   [id number]   Nurse Notes:  Patient  is  cogitatively intact. There were no vitals filed for this visit. There is no height or weight on file to calculate BMI. Patient stated that she has no issues with gait or balance; does not use any assistive devices.   Medical screening examination/treatment/procedure(s) were performed by non-physician practitioner and as supervising physician I was immediately available for consultation/collaboration.  I agree with above. Lew Dawes, MD

## 2019-12-07 NOTE — Patient Instructions (Addendum)
Wendy Serrano , Thank you for taking time to come for your Medicare Wellness Visit. I appreciate your ongoing commitment to your health goals. Please review the following plan we discussed and let me know if I can assist you in the future.   Screening recommendations/referrals: Colonoscopy: 04/29/2013 Mammogram: 03/15/2018 Bone Density: 07/04/2019 Recommended yearly ophthalmology/optometry visit for glaucoma screening and checkup Recommended yearly dental visit for hygiene and checkup  Vaccinations: Influenza vaccine: 01/12/2019 Pneumococcal vaccine: completed Tdap vaccine: 06/14/2018 Shingles vaccine: never done   Covid-19: completed  Advanced directives: Please bring a copy of your health care power of attorney and living will to the office at your convenience.  Conditions/risks identified: Yes; Please continue to do your personal lifestyle choices by: daily care of teeth and gums, regular physical activity (goal should be 5 days a week for 30 minutes), eat a healthy diet, avoid tobacco and drug use, limiting any alcohol intake, taking a low-dose aspirin (if not allergic or have been advised by your provider otherwise) and taking vitamins and minerals as recommended by your provider. Continue doing brain stimulating activities (puzzles, reading, adult coloring books, staying active) to keep memory sharp. Continue to eat heart healthy diet (full of fruits, vegetables, whole grains, lean protein, water--limit salt, fat, and sugar intake) and increase physical activity as tolerated.  Next appointment:  Please schedule your next Medicare Wellness Visit with your Nurse Health Advisor in 1 year.   Preventive Care 56 Years and Older, Female Preventive care refers to lifestyle choices and visits with your health care provider that can promote health and wellness. What does preventive care include?  A yearly physical exam. This is also called an annual well check.  Dental exams once or twice a  year.  Routine eye exams. Ask your health care provider how often you should have your eyes checked.  Personal lifestyle choices, including:  Daily care of your teeth and gums.  Regular physical activity.  Eating a healthy diet.  Avoiding tobacco and drug use.  Limiting alcohol use.  Practicing safe sex.  Taking low-dose aspirin every day.  Taking vitamin and mineral supplements as recommended by your health care provider. What happens during an annual well check? The services and screenings done by your health care provider during your annual well check will depend on your age, overall health, lifestyle risk factors, and family history of disease. Counseling  Your health care provider may ask you questions about your:  Alcohol use.  Tobacco use.  Drug use.  Emotional well-being.  Home and relationship well-being.  Sexual activity.  Eating habits.  History of falls.  Memory and ability to understand (cognition).  Work and work Statistician.  Reproductive health. Screening  You may have the following tests or measurements:  Height, weight, and BMI.  Blood pressure.  Lipid and cholesterol levels. These may be checked every 5 years, or more frequently if you are over 68 years old.  Skin check.  Lung cancer screening. You may have this screening every year starting at age 46 if you have a 30-pack-year history of smoking and currently smoke or have quit within the past 15 years.  Fecal occult blood test (FOBT) of the stool. You may have this test every year starting at age 12.  Flexible sigmoidoscopy or colonoscopy. You may have a sigmoidoscopy every 5 years or a colonoscopy every 10 years starting at age 43.  Hepatitis C blood test.  Hepatitis B blood test.  Sexually transmitted disease (STD) testing.  Diabetes  screening. This is done by checking your blood sugar (glucose) after you have not eaten for a while (fasting). You may have this done every 1-3  years.  Bone density scan. This is done to screen for osteoporosis. You may have this done starting at age 21.  Mammogram. This may be done every 1-2 years. Talk to your health care provider about how often you should have regular mammograms. Talk with your health care provider about your test results, treatment options, and if necessary, the need for more tests. Vaccines  Your health care provider may recommend certain vaccines, such as:  Influenza vaccine. This is recommended every year.  Tetanus, diphtheria, and acellular pertussis (Tdap, Td) vaccine. You may need a Td booster every 10 years.  Zoster vaccine. You may need this after age 74.  Pneumococcal 13-valent conjugate (PCV13) vaccine. One dose is recommended after age 26.  Pneumococcal polysaccharide (PPSV23) vaccine. One dose is recommended after age 68. Talk to your health care provider about which screenings and vaccines you need and how often you need them. This information is not intended to replace advice given to you by your health care provider. Make sure you discuss any questions you have with your health care provider. Document Released: 05/25/2015 Document Revised: 01/16/2016 Document Reviewed: 02/27/2015 Elsevier Interactive Patient Education  2017 [employer] Prevention in the Home Falls can cause injuries. They can happen to people of all ages. There are many things you can do to make your home safe and to help prevent falls. What can I do on the outside of my home?  Regularly fix the edges of walkways and driveways and fix any cracks.  Remove anything that might make you trip as you walk through a door, such as a raised step or threshold.  Trim any bushes or trees on the path to your home.  Use bright outdoor lighting.  Clear any walking paths of anything that might make someone trip, such as rocks or tools.  Regularly check to see if handrails are loose or broken. Make sure that both sides of any  steps have handrails.  Any raised decks and porches should have guardrails on the edges.  Have any leaves, snow, or ice cleared regularly.  Use sand or salt on walking paths during winter.  Clean up any spills in your garage right away. This includes oil or grease spills. What can I do in the bathroom?  Use night lights.  Install grab bars by the toilet and in the tub and shower. Do not use towel bars as grab bars.  Use non-skid mats or decals in the tub or shower.  If you need to sit down in the shower, use a plastic, non-slip stool.  Keep the floor dry. Clean up any water that spills on the floor as soon as it happens.  Remove soap buildup in the tub or shower regularly.  Attach bath mats securely with double-sided non-slip rug tape.  Do not have throw rugs and other things on the floor that can make you trip. What can I do in the bedroom?  Use night lights.  Make sure that you have a light by your bed that is easy to reach.  Do not use any sheets or blankets that are too big for your bed. They should not hang down onto the floor.  Have a firm chair that has side arms. You can use this for support while you get dressed.  Do not have throw rugs  and other things on the floor that can make you trip. What can I do in the kitchen?  Clean up any spills right away.  Avoid walking on wet floors.  Keep items that you use a lot in easy-to-reach places.  If you need to reach something above you, use a strong step stool that has a grab bar.  Keep electrical cords out of the way.  Do not use floor polish or wax that makes floors slippery. If you must use wax, use non-skid floor wax.  Do not have throw rugs and other things on the floor that can make you trip. What can I do with my stairs?  Do not leave any items on the stairs.  Make sure that there are handrails on both sides of the stairs and use them. Fix handrails that are broken or loose. Make sure that handrails are  as long as the stairways.  Check any carpeting to make sure that it is firmly attached to the stairs. Fix any carpet that is loose or worn.  Avoid having throw rugs at the top or bottom of the stairs. If you do have throw rugs, attach them to the floor with carpet tape.  Make sure that you have a light switch at the top of the stairs and the bottom of the stairs. If you do not have them, ask someone to add them for you. What else can I do to help prevent falls?  Wear shoes that:  Do not have high heels.  Have rubber bottoms.  Are comfortable and fit you well.  Are closed at the toe. Do not wear sandals.  If you use a stepladder:  Make sure that it is fully opened. Do not climb a closed stepladder.  Make sure that both sides of the stepladder are locked into place.  Ask someone to hold it for you, if possible.  Clearly mark and make sure that you can see:  Any grab bars or handrails.  First and last steps.  Where the edge of each step is.  Use tools that help you move around (mobility aids) if they are needed. These include:  Canes.  Walkers.  Scooters.  Crutches.  Turn on the lights when you go into a dark area. Replace any light bulbs as soon as they burn out.  Set up your furniture so you have a clear path. Avoid moving your furniture around.  If any of your floors are uneven, fix them.  If there are any pets around you, be aware of where they are.  Review your medicines with your doctor. Some medicines can make you feel dizzy. This can increase your chance of falling. Ask your doctor what other things that you can do to help prevent falls. This information is not intended to replace advice given to you by your health care provider. Make sure you discuss any questions you have with your health care provider. Document Released: 02/22/2009 Document Revised: 10/04/2015 Document Reviewed: 06/02/2014 Elsevier Interactive Patient Education  2017 [employer].

## 2019-12-14 DIAGNOSIS — M5011 Cervical disc disorder with radiculopathy,  high cervical region: Secondary | ICD-10-CM | POA: Diagnosis not present

## 2019-12-14 DIAGNOSIS — M9901 Segmental and somatic dysfunction of cervical region: Secondary | ICD-10-CM | POA: Diagnosis not present

## 2019-12-21 DIAGNOSIS — M5011 Cervical disc disorder with radiculopathy,  high cervical region: Secondary | ICD-10-CM | POA: Diagnosis not present

## 2019-12-21 DIAGNOSIS — M9901 Segmental and somatic dysfunction of cervical region: Secondary | ICD-10-CM | POA: Diagnosis not present

## 2019-12-22 ENCOUNTER — Ambulatory Visit: Payer: Medicare PPO | Admitting: Neurology

## 2020-01-02 DIAGNOSIS — H8110 Benign paroxysmal vertigo, unspecified ear: Secondary | ICD-10-CM | POA: Diagnosis not present

## 2020-01-03 DIAGNOSIS — M810 Age-related osteoporosis without current pathological fracture: Secondary | ICD-10-CM | POA: Diagnosis not present

## 2020-01-03 DIAGNOSIS — Z0189 Encounter for other specified special examinations: Secondary | ICD-10-CM | POA: Diagnosis not present

## 2020-01-03 DIAGNOSIS — R7989 Other specified abnormal findings of blood chemistry: Secondary | ICD-10-CM | POA: Diagnosis not present

## 2020-01-03 DIAGNOSIS — G3184 Mild cognitive impairment, so stated: Secondary | ICD-10-CM | POA: Diagnosis not present

## 2020-01-04 DIAGNOSIS — M9901 Segmental and somatic dysfunction of cervical region: Secondary | ICD-10-CM | POA: Diagnosis not present

## 2020-01-04 DIAGNOSIS — M5011 Cervical disc disorder with radiculopathy,  high cervical region: Secondary | ICD-10-CM | POA: Diagnosis not present

## 2020-01-06 DIAGNOSIS — H35351 Cystoid macular degeneration, right eye: Secondary | ICD-10-CM | POA: Diagnosis not present

## 2020-01-06 DIAGNOSIS — H35372 Puckering of macula, left eye: Secondary | ICD-10-CM | POA: Diagnosis not present

## 2020-01-06 DIAGNOSIS — Z79899 Other long term (current) drug therapy: Secondary | ICD-10-CM | POA: Diagnosis not present

## 2020-01-06 DIAGNOSIS — H209 Unspecified iridocyclitis: Secondary | ICD-10-CM | POA: Diagnosis not present

## 2020-01-25 NOTE — Progress Notes (Deleted)
PATIENT: Wendy Serrano DOB: 02-Feb-1946  REASON FOR VISIT: follow up HISTORY FROM: patient  HISTORY OF PRESENT ILLNESS: Today 01/25/20 Wendy Serrano is a 74 year old female with history of tinnitus, vertigo, and left head pressure.  She was placed on gabapentin and sent for physical therapy.  Has complained of memory issues.  HISTORY  09/21/2019 Dr. Jannifer Franklin: Wendy Serrano is a 74 year old right-handed black female with a history of intermittent tinnitus and vertigo, the tinnitus would affect the left ear only.  The patient was seen through ENT, she was never demonstrated to have any hearing deficit.  The tinnitus still comes and goes with some vertigo, but she comes into the office today with a new problem.  Over the last 2 months she has developed some pressure sensation in the back of her head on the left, she has associated left neck and shoulder discomfort as well.  She may have some popping in the neck when she turns her head from side to side.  She denies any pain down the arms on either side.  She has not had any numbness or weakness of the extremities or body.  She has had no change in speech or swallowing or change in vision.  She denies any balance changes or difficulty controlling the bowels or the bladder.  She does report a problem with short-term memory.  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  She denies overt headache, only pressure sensation.  She is concerned about this issue and comes in for further evaluation.  Over the last 2 weeks, her symptoms have significant improved but have not disappeared.  REVIEW OF SYSTEMS: Out of a complete 14 system review of symptoms, the patient complains only of the following symptoms, and all other reviewed systems are negative.  ALLERGIES: No Known Allergies  HOME MEDICATIONS: Outpatient Medications Prior to Visit  Medication Sig Dispense  Refill  . b complex vitamins tablet Take 1 tablet by mouth daily. 100 tablet 3  . calcium-vitamin D (OSCAL WITH D) 500-200 MG-UNIT per tablet Take 1 tablet by mouth 2 (two) times daily.      . folic acid (FOLVITE) 1 MG tablet Take 5 mg by mouth daily.     [name] gabapentin (NEURONTIN) 100 MG capsule Take 1 capsule (100 mg total) by mouth 3 (three) times daily. 90 capsule 3  . Garlic TABS Take 2 tablets by mouth 2 (two) times daily. Kyolic    . glucosamine-chondroitin 500-400 MG tablet Take 1 tablet by mouth 2 (two) times daily.    . hydrocortisone valerate cream (WESTCORT) 0.2 % Apply topically 2 (two) times daily. (Patient not taking: Reported on 09/21/2019) 45 g 2  . methotrexate (RHEUMATREX) 2.5 MG tablet TAKE 8 TABLETS BY MOUTH ONCE WEEKLY AS DIRECTED  3  . Multiple Vitamin (MULTIVITAMIN) tablet Take 1 tablet by mouth daily.      . Multiple Vitamins-Minerals (OCUVITE ADULT 50+) CAPS Take 1 capsule by mouth daily.    . prednisoLONE acetate (PREDNISOLONE ACETATE P-F) 1 % ophthalmic suspension Place 1 drop into both eyes 2 (two) times daily.     [name] SIMBRINZA 1-0.2 % SUSP Place 1 drop into both eyes 3 (three) times daily.  6   No facility-administered medications prior to visit.    PAST MEDICAL HISTORY: Past Medical History:  Diagnosis Date  . Allergic rhinitis   . 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  C. difficile diarrhea 06/2017  . Cervicogenic headache 09/21/2019  . Eczema    childhood  . H/O osteopenia 01/09/04  . H/O osteoporosis 2008  . H/O varicella   . 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 History of measles, mumps, or rubella   . Leukopenia    chronic  . Liver hemangioma   . 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 Menopausal symptoms   . Renal cyst    bilateral  . Rotator cuff tear   . Yeast infection     PAST SURGICAL HISTORY: Past Surgical History:  Procedure Laterality Date  . ROTATOR CUFF REPAIR Left 08/2009   Left  shoulder-rotator cuff repair [facility])  . WISDOM TOOTH EXTRACTION      FAMILY HISTORY: Family History  Problem Relation Age of Onset  . Prostate cancer Father        died  . Pancreatic cancer Mother        died  . Diabetes Mother   . Hyperlipidemia Sister     SOCIAL HISTORY: Social History   Socioeconomic History  . Marital status: Married    Spouse name: Not on file  . Number of children: 0  . Years of education: Not on file  . Highest education level: Not on file  Occupational History  . Occupation: Retired  Tobacco Use  . Smoking status: Never Smoker  . Smokeless tobacco: Never Used  Substance and Sexual Activity  . Alcohol use: No  . Drug use: No  . Sexual activity: Never    Birth control/protection: Post-menopausal  Other Topics Concern  . Not on file  Social History Narrative   [employer] BA; MEd-teaching;MEd Administration @ [employer]   Married '67   Has 11 children who were foster care or mentored   Retired   [employer] retired, Has had some health problems. They are doing well   Marriage is in good health and retirement is good   End-of-life: provided packet of information and forms for consideration (Oct'20)   Social Determinants of Health   Financial Resource Strain: Low Risk   . Difficulty of Paying Living Expenses: Not hard at all  Food Insecurity: No Food Insecurity  . Worried About Charity fundraiser in the Last Year: Never true  . Ran Out of Food in the Last Year: Never true  Transportation Needs: No Transportation Needs  . Lack of Transportation (Medical): No  . Lack of Transportation (Non-Medical): No  Physical Activity: Sufficiently Active  . Days of Exercise per Week: 5 days  . Minutes of Exercise per Session: 30 min  Stress: No Stress Concern Present  . Feeling of Stress : Not at all  Social Connections: Socially Integrated  . Frequency of Communication with Friends and Family: More than three times a week  . Frequency of Social Gatherings  with Friends and Family: More than three times a week  . Attends Religious Services: More than 4 times per year  . Active Member of Clubs or Organizations: Yes  . Attends Archivist Meetings: More than 4 times per year  . Marital Status: Married  Human resources officer Violence: Not At Risk  . Fear of Current or Ex-Partner: No  . Emotionally Abused: No  . Physically Abused: No  . Sexually Abused: No      PHYSICAL EXAM  There were no vitals filed for this visit. There is no height or weight on file to  calculate BMI.  Generalized: Well developed, in no acute distress   Neurological examination  Mentation: Alert oriented to time, place, history taking. Follows all commands speech and language fluent Cranial nerve II-XII: Pupils were equal round reactive to light. Extraocular movements were full, visual field were full on confrontational test. Facial sensation and strength were normal. Uvula tongue midline. Head turning and shoulder shrug  were normal and symmetric. Motor: The motor testing reveals 5 over 5 strength of all 4 extremities. Good symmetric motor tone is noted throughout.  Sensory: Sensory testing is intact to soft touch on all 4 extremities. No evidence of extinction is noted.  Coordination: Cerebellar testing reveals good finger-nose-finger and heel-to-shin bilaterally.  Gait and station: Gait is normal. Tandem gait is normal. Romberg is negative. No drift is seen.  Reflexes: Deep tendon reflexes are symmetric and normal bilaterally.   DIAGNOSTIC DATA (LABS, IMAGING, TESTING) - I reviewed patient records, labs, notes, testing and imaging myself where available.  Lab Results  Component Value Date   WBC 4.6 06/16/2019   HGB 13.1 06/16/2019   HCT 39.3 06/16/2019   MCV 97.0 06/16/2019   PLT 272.0 06/16/2019      Component Value Date/Time   NA 141 06/16/2019 1125   K 4.0 06/16/2019 1125   CL 105 06/16/2019 1125   CO2 29 06/16/2019 1125   GLUCOSE 97 06/16/2019  1125   BUN 17 06/16/2019 1125   CREATININE 0.81 06/16/2019 1125   CALCIUM 9.8 06/16/2019 1125   PROT 7.5 06/16/2019 1125   ALBUMIN 4.6 06/16/2019 1125   AST 23 06/16/2019 1125   ALT 19 06/16/2019 1125   ALKPHOS 62 06/16/2019 1125   BILITOT 0.7 06/16/2019 1125   GFRNONAA 111.51 02/19/2010 1100   GFRAA 81 02/02/2008 0955   Lab Results  Component Value Date   CHOL 227 (H) 06/16/2019   HDL 96.00 06/16/2019   LDLCALC 125 (H) 06/16/2019   LDLDIRECT 127.9 03/09/2013   TRIG 31.0 06/16/2019   CHOLHDL 2 06/16/2019   Lab Results  Component Value Date   HGBA1C 5.4 02/26/2011   Lab Results  Component Value Date   VITAMINB12 1,979 (H) 03/29/2018   Lab Results  Component Value Date   TSH 6.85 (H) 06/16/2019      ASSESSMENT AND PLAN 74 y.o. 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 difficile diarrhea (06/2017), Cervicogenic headache (09/21/2019), Eczema, H/O osteopenia (01/09/04), H/O osteoporosis (2008), H/O varicella, Herpes simplex without mention of complication, History of measles, mumps, or rubella, Leukopenia, Liver hemangioma, Macular degeneration (oct '11), Menopausal symptoms, Renal cyst, Rotator cuff tear, and Yeast infection. here with ***   I spent 15 minutes with the patient. 50% of this time was spent   Butler Denmark, Steele, DNP 01/25/2020, 2:51 PM [facility] [address] [place], [place] [address] [phone]

## 2020-01-26 ENCOUNTER — Ambulatory Visit: Payer: Medicare PPO | Admitting: Neurology

## 2020-01-26 ENCOUNTER — Other Ambulatory Visit: Payer: Self-pay | Admitting: Internal Medicine

## 2020-01-26 DIAGNOSIS — R413 Other amnesia: Secondary | ICD-10-CM

## 2020-01-26 DIAGNOSIS — L819 Disorder of pigmentation, unspecified: Secondary | ICD-10-CM | POA: Diagnosis not present

## 2020-01-26 DIAGNOSIS — L988 Other specified disorders of the skin and subcutaneous tissue: Secondary | ICD-10-CM | POA: Diagnosis not present

## 2020-01-26 NOTE — Progress Notes (Signed)
neurol ref

## 2020-02-08 ENCOUNTER — Telehealth: Payer: Self-pay

## 2020-02-08 NOTE — Telephone Encounter (Signed)
Patient is a current patient of Dr. Jannifer Franklin and is requesting a provider switch to Dr. Brett Fairy. Please advise.

## 2020-02-08 NOTE — Telephone Encounter (Signed)
No need -The patient was seen last 3 month ago and is an active patient- no need to switch providers. She was under the impression that Dr Jannifer Franklin would retire soon. CD

## 2020-02-28 ENCOUNTER — Telehealth (INDEPENDENT_AMBULATORY_CARE_PROVIDER_SITE_OTHER): Payer: Medicare PPO | Admitting: Family Medicine

## 2020-02-28 ENCOUNTER — Other Ambulatory Visit: Payer: Self-pay

## 2020-02-28 DIAGNOSIS — R197 Diarrhea, unspecified: Secondary | ICD-10-CM

## 2020-02-28 NOTE — Progress Notes (Signed)
Virtual Visit via Video Note  I connected with Wendy Serrano  on 02/28/20 at  4:20 PM EDT by a video enabled telemedicine application and verified that I am speaking with the correct person using two identifiers.  Location patient: home, [place] Location provider:work or home office Persons participating in the virtual visit: patient, provider  I discussed the limitations of evaluation and management by telemedicine and the availability of in person appointments. The patient expressed understanding and agreed to proceed.   HPI:  Acute telemedicine visit for a "stomach virus" : -Onset: 2 days ago -Symptoms include: yesterday 2 episodes of watery diarrhea, 1-2 episodes of diarrhea today -Denies: fevers, nausea, vomiting, abdominal pain, hematochezia, melena, congestion, cough -no known sick contacts, denies recent travel or antibiotics -did go to the church service -Temp 97.6 -Has tried: none -Pertinent medication allergies:no known medication allergies, remote history of C. difficile colitis -COVID-19 vaccine status: fully vaccinated and had pfizer booster 3 weeks ago  ROS: See pertinent positives and negatives per HPI.  Past Medical History:  Diagnosis Date  . Allergic rhinitis   . 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 C. difficile diarrhea 06/2017  . Cervicogenic headache 09/21/2019  . Eczema    childhood  . H/O osteopenia 01/09/04  . H/O osteoporosis 2008  . H/O varicella   . 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 History of measles, mumps, or rubella   . Leukopenia    chronic  . Liver hemangioma   . 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 Menopausal symptoms   . Renal cyst    bilateral  . Rotator cuff tear   . Yeast infection     Past Surgical History:  Procedure Laterality Date  . ROTATOR CUFF REPAIR Left 08/2009   Left shoulder-rotator cuff repair [facility])  . WISDOM TOOTH EXTRACTION       Current Outpatient Medications:  .  b  complex vitamins tablet, Take 1 tablet by mouth daily., Disp: 100 tablet, Rfl: 3 .  calcium-vitamin D (OSCAL WITH D) 500-200 MG-UNIT per tablet, Take 1 tablet by mouth 2 (two) times daily.  , Disp: , Rfl:  .  folic acid (FOLVITE) 1 MG tablet, Take 5 mg by mouth daily. , Disp: , Rfl:  .  gabapentin (NEURONTIN) 100 MG capsule, Take 1 capsule (100 mg total) by mouth 3 (three) times daily., Disp: 90 capsule, Rfl: 3 .  Garlic TABS, Take 2 tablets by mouth 2 (two) times daily. Kyolic, Disp: , Rfl:  .  glucosamine-chondroitin 500-400 MG tablet, Take 1 tablet by mouth 2 (two) times daily., Disp: , Rfl:  .  hydrocortisone valerate cream (WESTCORT) 0.2 %, Apply topically 2 (two) times daily. (Patient not taking: Reported on 09/21/2019), Disp: 45 g, Rfl: 2 .  methotrexate (RHEUMATREX) 2.5 MG tablet, TAKE 8 TABLETS BY MOUTH ONCE WEEKLY AS DIRECTED, Disp: , Rfl: 3 .  Multiple Vitamin (MULTIVITAMIN) tablet, Take 1 tablet by mouth daily.  , Disp: , Rfl:  .  Multiple Vitamins-Minerals (OCUVITE ADULT 50+) CAPS, Take 1 capsule by mouth daily., Disp: , Rfl:  .  prednisoLONE acetate (PREDNISOLONE ACETATE P-F) 1 % ophthalmic suspension, Place 1 drop into both eyes 2 (two) times daily. , Disp: , Rfl:  .  SIMBRINZA 1-0.2 % SUSP, Place 1 drop into both eyes 3 (three) times daily., Disp: , Rfl: 6  EXAM:  VITALS per patient if applicable:  GENERAL: alert, oriented, appears well and in  no acute distress  HEENT: atraumatic, conjunttiva clear, no obvious abnormalities on inspection of external nose and ears  NECK: normal movements of the head and neck  LUNGS: on inspection no signs of respiratory distress, breathing rate appears normal, no obvious gross SOB, gasping or wheezing  CV: no obvious cyanosis  MS: moves all visible extremities without noticeable abnormality  PSYCH/NEURO: pleasant and cooperative, no obvious depression or anxiety, speech and thought processing grossly intact  ASSESSMENT AND  PLAN:  Discussed the following assessment and plan:  Diarrhea, unspecified type  -we discussed possible serious and likely etiologies, options for evaluation and workup, limitations of telemedicine visit vs in person visit, treatment, treatment risks and precautions. Pt prefers to treat via telemedicine empirically rather than in person at this moment.  Query viral gastroenteritis versus other.  Opted for trial of Imodium and avoidance of dairy and red meat for 1 week.  Advised if worsening, new symptoms arise or if is not resolving over the next several days that she schedule a follow-up visit for reevaluation.  Scheduled follow up with PCP offered: Agrees to call if needed  Did let this patient know that I only do telemedicine on Tuesdays and Thursdays for [place]. Advised to schedule follow up visit with PCP or [facility] if any further questions or concerns to avoid delays in care.   I discussed the assessment and treatment plan with the patient. The patient was provided an opportunity to ask questions and all were answered. The patient agreed with the plan and demonstrated an understanding of the instructions.     Lucretia Kern, DO

## 2020-03-19 DIAGNOSIS — M542 Cervicalgia: Secondary | ICD-10-CM | POA: Diagnosis not present

## 2020-03-30 DIAGNOSIS — M542 Cervicalgia: Secondary | ICD-10-CM | POA: Diagnosis not present

## 2020-04-18 DIAGNOSIS — M542 Cervicalgia: Secondary | ICD-10-CM | POA: Diagnosis not present

## 2020-04-20 DIAGNOSIS — Z79899 Other long term (current) drug therapy: Secondary | ICD-10-CM | POA: Diagnosis not present

## 2020-04-20 DIAGNOSIS — H40043 Steroid responder, bilateral: Secondary | ICD-10-CM | POA: Insufficient documentation

## 2020-04-20 DIAGNOSIS — H35363 Drusen (degenerative) of macula, bilateral: Secondary | ICD-10-CM | POA: Diagnosis not present

## 2020-04-20 DIAGNOSIS — H31092 Other chorioretinal scars, left eye: Secondary | ICD-10-CM | POA: Diagnosis not present

## 2020-04-20 DIAGNOSIS — H209 Unspecified iridocyclitis: Secondary | ICD-10-CM | POA: Diagnosis not present

## 2020-04-20 DIAGNOSIS — H35372 Puckering of macula, left eye: Secondary | ICD-10-CM | POA: Diagnosis not present

## 2020-04-20 DIAGNOSIS — H35351 Cystoid macular degeneration, right eye: Secondary | ICD-10-CM | POA: Diagnosis not present

## 2020-04-26 DIAGNOSIS — M542 Cervicalgia: Secondary | ICD-10-CM | POA: Diagnosis not present

## 2020-05-03 ENCOUNTER — Ambulatory Visit: Payer: Medicare PPO | Admitting: Neurology

## 2020-05-03 ENCOUNTER — Encounter: Payer: Self-pay | Admitting: Neurology

## 2020-05-03 VITALS — BP 119/72 | HR 84 | Ht 67.0 in | Wt 136.8 lb

## 2020-05-03 DIAGNOSIS — R413 Other amnesia: Secondary | ICD-10-CM | POA: Diagnosis not present

## 2020-05-03 DIAGNOSIS — E538 Deficiency of other specified B group vitamins: Secondary | ICD-10-CM

## 2020-05-03 DIAGNOSIS — F09 Unspecified mental disorder due to known physiological condition: Secondary | ICD-10-CM

## 2020-05-03 MED ORDER — DONEPEZIL HCL 5 MG PO TABS: 5.0000 mg | ORAL_TABLET | Freq: Every day | ORAL | 1 refills | Status: DC

## 2020-05-03 NOTE — Progress Notes (Addendum)
Reason for visit: Memory disturbance  Referring physician: Dr. Landis Martins is a 74 y.o. female  History of present illness:  Ms. Slider is a 74 year old right-handed black female who has been seen previously for problems with tinnitus in the left ear.  The patient comes in to the office reporting some problems with short-term memory that began sometime within the last year.  She denies any problems with word finding or problems with names of people or things.  She does repeat herself at times.  She has misplaced things about the house on occasion.  She claims that coming to the office today she got lost getting here.  She indicates that her husband does most of the finances, she is able to keep up with her own medications and appointments.  She gets adequate rest, she tends to go to bed around 3 AM and sleeps to about 1 PM but this is her usual pattern.  She denies any family history of memory problems.  She denies issues with numbness or weakness of the extremities or difficulty with gait instability.  She has been taking an over-the-counter supplement of lion mushroom for memory.  She comes here for further evaluation.  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 She has never used smokeless tobacco. She reports that she does not drink alcohol and does not use drugs.  Medications:  Prior to Admission medications   Medication Sig Start Date End Date Taking? Authorizing Provider  b complex vitamins tablet Take 1 tablet by mouth daily. 06/16/19  Yes Plotnikov, Evie Lacks, MD  calcium-vitamin D (OSCAL WITH D) 500-200 MG-UNIT per tablet Take 1 tablet by mouth 2 (two) times daily.   Yes [provider]  folic acid (FOLVITE) 1 MG tablet Take 5 mg by mouth daily.  07/24/15  Yes [provider]  Garlic TABS Take 2 tablets by mouth 2 (two) times daily. Kyolic   Yes [provider]  glucosamine-chondroitin 500-400 MG tablet Take 1 tablet by mouth 2 (two) times daily.   Yes [provider]  hydrocortisone valerate cream (WESTCORT) 0.2 % Apply topically 2 (two) times daily. 06/12/17  Yes Plotnikov, Evie Lacks, MD  methotrexate (RHEUMATREX) 2.5 MG tablet TAKE 8 TABLETS BY MOUTH ONCE WEEKLY AS DIRECTED 08/09/15  Yes [provider]  Multiple Vitamin (MULTIVITAMIN) tablet Take 1 tablet by mouth daily.   Yes [provider]  Multiple Vitamins-Minerals (OCUVITE ADULT 50+) CAPS Take 1 capsule by mouth daily.   Yes [provider]  prednisoLONE acetate (PRED FORTE) 1 % ophthalmic suspension Place 1 drop into both eyes 2 (two) times daily.    Yes [provider]  SIMBRINZA 1-0.2 % SUSP Place 1 drop into both eyes 3 (three) times daily. 07/29/15  Yes [provider]     No Known Allergies  ROS:  Out of a complete 14 system review of symptoms, the patient complains only of the following symptoms, and all other reviewed systems are negative.  Memory problems Left-sided tinnitus  Blood pressure 119/72, pulse 84,  height 5\' 7"  (1.702 m), weight 136 lb 12.8 oz (62.1 kg).  Physical Exam  General: The patient is alert and cooperative at the time of the examination.  Eyes: Pupils are equal, round, and reactive to light. Discs are flat bilaterally.  Neck: The neck is supple, no carotid bruits are noted.  Respiratory: The respiratory examination is clear.  Cardiovascular: The cardiovascular examination reveals a regular rate and rhythm, no obvious murmurs or rubs are noted.  Skin: Extremities are without significant edema.  Neurologic Exam  Mental status: The patient is alert and oriented x 3 at the time of the examination. The patient has apparent normal recent and remote memory, with an apparently normal attention span and concentration ability.  The Mini-Mental status examination done today shows a total score 29/30.  Cranial nerves: Facial symmetry is present. There is good sensation of the face to pinprick and soft touch bilaterally. The strength of the facial muscles and the muscles to head turning and shoulder shrug are normal bilaterally. Speech is well enunciated, no aphasia or dysarthria is noted. Extraocular movements are full. Visual fields are full. The tongue is midline, and the patient has symmetric elevation of the soft palate. No obvious hearing deficits are noted.  Motor: The motor testing reveals 5 over 5 strength of all 4 extremities. Good symmetric motor tone is noted throughout.  Sensory: Sensory testing is intact to pinprick, soft touch and vibration sensation on all 4 extremities. No evidence of extinction is noted.  Coordination: Cerebellar testing reveals good finger-nose-finger and heel-to-shin bilaterally.  Gait and station: Gait is normal. Tandem gait is normal. Romberg is negative. No drift is seen.  Reflexes: Deep tendon reflexes are symmetric and normal bilaterally. Toes are downgoing bilaterally.   Assessment/Plan:  1.  Memory disturbance  The patient appears to  have a mild cognitive deficit.  Just during this evaluation she repeated herself several times.  The patient will be set up for some blood work today, she will have MRI of the brain.  We will initiate treatment with Aricept.  She will follow up in 6 months for reevaluation.  Jill Alexanders MD 05/03/2020 11:01 AM  [facility] [address] [place], [place] [address]  Phone [phone] Fax [phone]

## 2020-05-03 NOTE — Patient Instructions (Signed)
We will start aricept for the memory.  Begin Aricept (donepezil) at 5 mg at night for one month. If this medication is well-tolerated, please call our office and we will call in a prescription for the 10 mg tablets. Look out for side effects that may include nausea, diarrhea, weight loss, or stomach cramps. This medication will also cause a runny nose, therefore there is no need for allergy medications for this purpose.  

## 2020-05-04 LAB — RPR: RPR Ser Ql: NONREACTIVE

## 2020-05-04 LAB — VITAMIN B12: Vitamin B-12: 1415 pg/mL — ABNORMAL HIGH (ref 232–1245)

## 2020-05-04 LAB — SEDIMENTATION RATE: Sed Rate: 2 mm/hr (ref 0–40)

## 2020-05-09 ENCOUNTER — Other Ambulatory Visit: Payer: Self-pay

## 2020-05-09 ENCOUNTER — Telehealth: Payer: Self-pay | Admitting: Neurology

## 2020-05-09 NOTE — Telephone Encounter (Signed)
Sent to [facility] via [employer] / Email  auth # [id number] 05/07/2020-06/06/2020  MRI  GI [phone] Arma Heading

## 2020-05-10 ENCOUNTER — Encounter: Payer: Self-pay | Admitting: Internal Medicine

## 2020-05-10 ENCOUNTER — Ambulatory Visit: Payer: Medicare PPO | Admitting: Internal Medicine

## 2020-05-10 VITALS — BP 122/86 | HR 81 | Ht 67.0 in | Wt 135.0 lb

## 2020-05-10 DIAGNOSIS — G4720 Circadian rhythm sleep disorder, unspecified type: Secondary | ICD-10-CM

## 2020-05-10 DIAGNOSIS — M542 Cervicalgia: Secondary | ICD-10-CM | POA: Diagnosis not present

## 2020-05-10 DIAGNOSIS — M79672 Pain in left foot: Secondary | ICD-10-CM

## 2020-05-10 DIAGNOSIS — R413 Other amnesia: Secondary | ICD-10-CM

## 2020-05-10 DIAGNOSIS — M79671 Pain in right foot: Secondary | ICD-10-CM

## 2020-05-10 MED ORDER — METHYLPREDNISOLONE ACETATE 40 MG/ML IJ SUSP
40.0000 mg | Freq: Once | INTRAMUSCULAR | Status: AC
  Administered 2020-05-10: 12:00:00 40 mg via INTRAMUSCULAR

## 2020-05-10 MED ORDER — DONEPEZIL HCL 5 MG PO TABS
5.0000 mg | ORAL_TABLET | Freq: Every day | ORAL | 1 refills | Status: DC
Start: 2020-05-10 — End: 2020-08-22

## 2020-05-10 NOTE — Progress Notes (Signed)
Subjective:  Patient ID: Wendy Serrano, female    DOB: 16-Nov-1945  Age: 74 y.o. MRN: [id number]  CC: Neck Pain   HPI Wendy Serrano presents for memory problems, unusual sleep pattern, pain in the cervical spine.  She goes to bed at 3-4 AM and gets up at 1 PM.  She is very productive during nighttime.  She has been doing this for years.  Outpatient Medications Prior to Visit  Medication Sig Dispense Refill  . b complex vitamins tablet Take 1 tablet by mouth daily. 100 tablet 3  . calcium-vitamin D (OSCAL WITH D) 500-200 MG-UNIT per tablet Take 1 tablet by mouth 2 (two) times daily.    [name] donepezil (ARICEPT) 5 MG tablet Take 1 tablet (5 mg total) by mouth at bedtime. 30 tablet 1  . folic acid (FOLVITE) 1 MG tablet Take 5 mg by mouth daily.     . Garlic TABS Take 2 tablets by mouth 2 (two) times daily. Kyolic    . glucosamine-chondroitin 500-400 MG tablet Take 1 tablet by mouth 2 (two) times daily.    . hydrocortisone valerate cream (WESTCORT) 0.2 % Apply topically 2 (two) times daily. 45 g 2  . methotrexate (RHEUMATREX) 2.5 MG tablet TAKE 8 TABLETS BY MOUTH ONCE WEEKLY AS DIRECTED  3  . Multiple Vitamin (MULTIVITAMIN) tablet Take 1 tablet by mouth daily.    . Multiple Vitamins-Minerals (OCUVITE ADULT 50+) CAPS Take 1 capsule by mouth daily.    . prednisoLONE acetate (PRED FORTE) 1 % ophthalmic suspension Place 1 drop into both eyes 2 (two) times daily.     [name] SIMBRINZA 1-0.2 % SUSP Place 1 drop into both eyes 3 (three) times daily.  6   No facility-administered medications prior to visit.    ROS: Review of Systems  Constitutional: Negative for activity change, appetite change, chills, fatigue and unexpected weight change.  HENT: Negative for congestion, mouth sores and sinus pressure.   Eyes: Negative for visual disturbance.  Respiratory: Negative for cough and chest tightness.   Gastrointestinal: Negative for abdominal pain and nausea.  Genitourinary: Negative for difficulty  urinating, frequency and vaginal pain.  Musculoskeletal: Negative for back pain and gait problem.  Skin: Negative for pallor and rash.  Neurological: Negative for dizziness, tremors, weakness, numbness and headaches.  Psychiatric/Behavioral: Positive for decreased concentration. Negative for confusion, sleep disturbance and suicidal ideas. The patient is nervous/anxious.     Objective:  BP 122/86   Pulse 81   Ht 5\' 7"  (1.702 m)   Wt 135 lb (61.2 kg)   SpO2 97%   BMI 21.14 kg/m   BP Readings from Last 3 Encounters:  05/10/20 122/86  05/03/20 119/72  09/21/19 136/85    Wt Readings from Last 3 Encounters:  05/10/20 135 lb (61.2 kg)  05/03/20 136 lb 12.8 oz (62.1 kg)  09/21/19 140 lb (63.5 kg)    Physical Exam Constitutional:      General: She is not in acute distress.    Appearance: She is well-developed.  HENT:     Head: Normocephalic.     Right Ear: External ear normal.     Left Ear: External ear normal.     Nose: Nose normal.     Mouth/Throat:     Mouth: Oropharynx is clear and moist.  Eyes:     General:        Right eye: No discharge.        Left eye: No discharge.     Conjunctiva/sclera: Conjunctivae  normal.     Pupils: Pupils are equal, round, and reactive to light.  Neck:     Thyroid: No thyromegaly.     Vascular: No JVD.     Trachea: No tracheal deviation.  Cardiovascular:     Rate and Rhythm: Normal rate and regular rhythm.     Heart sounds: Normal heart sounds.  Pulmonary:     Effort: No respiratory distress.     Breath sounds: No stridor. No wheezing.  Abdominal:     General: Bowel sounds are normal. There is no distension.     Palpations: Abdomen is soft. There is no mass.     Tenderness: There is no abdominal tenderness. There is no guarding or rebound.  Musculoskeletal:        General: No tenderness or edema.     Cervical back: Normal range of motion and neck supple.  Lymphadenopathy:     Cervical: No cervical adenopathy.  Skin:    Findings:  No erythema or rash.  Neurological:     Mental Status: She is oriented to person, place, and time.     Cranial Nerves: No cranial nerve deficit.     Motor: No abnormal muscle tone.     Coordination: Coordination normal.     Deep Tendon Reflexes: Reflexes normal.  Psychiatric:        Mood and Affect: Mood and affect normal.        Behavior: Behavior normal.        Thought Content: Thought content normal.        Judgment: Judgment normal.   Neck is stiff with range of motion with tender trapezius muscles The patient is alert oriented and cooperative  Lab Results  Component Value Date   WBC 4.6 06/16/2019   HGB 13.1 06/16/2019   HCT 39.3 06/16/2019   PLT 272.0 06/16/2019   GLUCOSE 97 06/16/2019   CHOL 227 (H) 06/16/2019   TRIG 31.0 06/16/2019   HDL 96.00 06/16/2019   LDLDIRECT 127.9 03/09/2013   LDLCALC 125 (H) 06/16/2019   ALT 19 06/16/2019   AST 23 06/16/2019   NA 141 06/16/2019   K 4.0 06/16/2019   CL 105 06/16/2019   CREATININE 0.81 06/16/2019   BUN 17 06/16/2019   CO2 29 06/16/2019   TSH 6.85 (H) 06/16/2019   INR 1.1 (H) 05/16/2015   HGBA1C 5.4 02/26/2011    DG Bone Density  Result Date: 07/06/2019 Date of study: 07/04/2019 Exam: DUAL X-RAY ABSORPTIOMETRY (DXA) FOR BONE MINERAL DENSITY (BMD) Instrument: [employer] Requesting Provider: PCP Indication: screening for osteoporosis Comparison:  2011 and 2019 (please note that it is not possible to compare data from different instruments) Clinical data: Pt is a 74 y.o. female . Results:  Lumbar spine L1-L3 (L4) Femoral neck (FN) T-score   -0.9 RFN: -1.9 LFN: -2.8 Change in BMD from previous DXA test (%) Down 7.4*%  Down 3.9% (*) statistically significant Assessment: Patient has OSTEOPOROSIS according to the [facility] classification for osteoporosis (see below). Z Score compares the patients bone density to age, sex, and race matched controls.  Compared to age, sex, and race matched controls, this patient's bone density is   average. Fracture risk: high Comments: the technical quality of the study is good. In patients older than 75, the high prevalence of degenerative changes in the lumbar spine results in artificially higher bone density readings. L4 vertebra had to be excluded from analysis due to DJD WHO criteria for diagnosis of osteoporosis in postmenopausal women  and in men 43 y/o or older: - normal: T-score -1.0 to + 1.0 - osteopenia/low bone density: T-score between -2.5 and -1.0 - osteoporosis: T-score below -2.5 - severe osteoporosis: T-score below -2.5 with history of fragility fracture Note: although not part of the WHO classification, the presence of a fragility fracture, regardless of the T-score, should be considered diagnostic of osteoporosis, provided other causes for the fracture have been excluded. Treatment: The National Osteoporosis Foundation recommends that treatment be considered in postmenopausal women and men age 84 or older with: 1. Hip or vertebral (clinical or morphometric) fracture 2. T-score of - 2.5 or lower at the spine or hip 3. 10-year fracture probability by FRAX of at least 20% for a major osteoporotic fracture and 3% for a hip fracture Recommendations: ? Recommend optimizing calcium (1200 mg/day) and vitamin D (800 IU/day). ? Consider therapy with bisphosphonate (alendronate, risedronate, or ibandronate) ? Consider IV bisphosphonate therapy (zoledronic acid), especially if oral therapy is contraindicated or not              tolerated. ? Consider Denosumab Followup: Repeat BMD in 2 -3 years or as clinically indicated Interpreted by : Mack Guise, MD [facility]    Assessment & Plan:     Follow-up: No follow-ups on file.  Walker Kehr, MD

## 2020-05-11 ENCOUNTER — Encounter: Payer: Self-pay | Admitting: Internal Medicine

## 2020-05-11 DIAGNOSIS — G4720 Circadian rhythm sleep disorder, unspecified type: Secondary | ICD-10-CM | POA: Insufficient documentation

## 2020-05-11 DIAGNOSIS — M542 Cervicalgia: Secondary | ICD-10-CM | POA: Insufficient documentation

## 2020-05-11 NOTE — Assessment & Plan Note (Signed)
Dr. Mamie Laurel mane Aricept  5 mg to start per Dr. Anne Hahn  brain MRI was scheduled

## 2020-05-11 NOTE — Assessment & Plan Note (Signed)
She goes to bed at 3-4 AM and gets up at 1 PM.  She is very productive during nighttime.  She has been doing this for years.  Discussed.  No intervention.

## 2020-05-14 ENCOUNTER — Telehealth: Payer: Self-pay | Admitting: *Deleted

## 2020-05-14 DIAGNOSIS — M79672 Pain in left foot: Secondary | ICD-10-CM | POA: Insufficient documentation

## 2020-05-14 DIAGNOSIS — M79671 Pain in right foot: Secondary | ICD-10-CM | POA: Insufficient documentation

## 2020-05-14 NOTE — Assessment & Plan Note (Signed)
?  OA - Podiatry ref

## 2020-05-14 NOTE — Addendum Note (Signed)
Addended by: Tresa Garter on: 05/14/2020 05:05 PM   Modules accepted: Orders

## 2020-05-14 NOTE — Telephone Encounter (Signed)
-----   Message from Tresa Garter, MD sent at 05/11/2020  7:16 PM EST ----- Valentina Gu, Please confirm with Angela that she wants me to refer her to [facility] for consultation. Thanks

## 2020-05-17 ENCOUNTER — Other Ambulatory Visit: Payer: Self-pay

## 2020-05-17 ENCOUNTER — Ambulatory Visit
Admission: RE | Admit: 2020-05-17 | Discharge: 2020-05-17 | Disposition: A | Discharge status: Home / Self Care | Payer: Medicare PPO | Source: Ambulatory Visit | Attending: Neurology | Admitting: Neurology

## 2020-05-17 DIAGNOSIS — R413 Other amnesia: Secondary | ICD-10-CM | POA: Diagnosis not present

## 2020-05-17 DIAGNOSIS — F09 Unspecified mental disorder due to known physiological condition: Secondary | ICD-10-CM | POA: Diagnosis not present

## 2020-05-18 ENCOUNTER — Telehealth: Payer: Self-pay | Admitting: Neurology

## 2020-05-18 NOTE — Telephone Encounter (Signed)
Called pt she states they actually called her yesterday and will be seeing them.Marland KitchenJohny Chess

## 2020-05-18 NOTE — Telephone Encounter (Signed)
I called the patient.  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  The patient will start taking aspirin 81 mg daily.  She is on low-dose Aricept, if she tolerates the 5 mg dose for a month, we can increase her to the 10 mg dosing.    MRI brain 05/17/20:  IMPRESSION: This MRI of the brain without contrast shows the following: 1.   Mild generalized cortical atrophy, probably normal for age and unchanged compared to the 2019 MRI. 2.   Scattered T2/FLAIR hyperintense single and confluent foci predominantly in the hemispheres and a few foci in the pons and cerebellum.  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  None of the foci appear to be acute and there is no progression compared to the 2019 MRI. 3.   No acute findings.

## 2020-05-29 ENCOUNTER — Ambulatory Visit (INDEPENDENT_AMBULATORY_CARE_PROVIDER_SITE_OTHER): Payer: Medicare PPO

## 2020-05-29 ENCOUNTER — Ambulatory Visit: Payer: Medicare PPO | Admitting: Podiatry

## 2020-05-29 ENCOUNTER — Other Ambulatory Visit: Payer: Self-pay | Admitting: Podiatry

## 2020-05-29 ENCOUNTER — Other Ambulatory Visit: Payer: Self-pay

## 2020-05-29 DIAGNOSIS — M2041 Other hammer toe(s) (acquired), right foot: Secondary | ICD-10-CM | POA: Diagnosis not present

## 2020-05-29 DIAGNOSIS — M79672 Pain in left foot: Secondary | ICD-10-CM

## 2020-05-29 DIAGNOSIS — M2011 Hallux valgus (acquired), right foot: Secondary | ICD-10-CM

## 2020-05-29 DIAGNOSIS — M79671 Pain in right foot: Secondary | ICD-10-CM

## 2020-05-29 DIAGNOSIS — M21961 Unspecified acquired deformity of right lower leg: Secondary | ICD-10-CM

## 2020-05-29 NOTE — Progress Notes (Signed)
  Subjective:  Patient ID: Wendy Serrano, female    DOB: 01/14/1946,  MRN: [id number]  Chief Complaint  Patient presents with  . Foot Pain    Right plantar forefoot pain 2 months duration, no known injuries, pain present with walking but not at rest.   75 y.o. female presents with the above complaint. History confirmed with patient.   Objective:  Physical Exam: warm, good capillary refill, no trophic changes or ulcerative lesions, normal DP and PT pulses and normal sensory exam. Left Foot: hammertoes, HAV noted  Right Foot: HAV, hammertoes, prominent palpable 2nd metatarsal   No images are attached to the encounter.  Radiographs: X-ray of the right foot: no fracture, dislocation, swelling or degenerative changes noted and hallux valgus deformity Assessment:   1. Hallux valgus, right   2. Hammer toe of right foot   3. Metatarsal deformity, right    Plan:  Patient was evaluated and treated and all questions answered.  Capsulitis, Bunion and Hammertoe -Educated on etiology -XR reviewed with patient -Discussed padding and proper shoegear  -Should issus persist consider osteotomy of 2nd metatarsal and hallux valgus repair. Consider minimally invasive option  Return in about 4 weeks (around 06/26/2020) for Capsulitis, Metatasrsal deformity right.

## 2020-05-29 NOTE — Patient Instructions (Signed)
Hammer Toe Hammer toe is a change in the shape, or a deformity, of the toe. The deformity causes the middle joint of the toe to stay bent. Hammer toe starts gradually. At first, the toe can be straightened. Then over time, the toe deformity becomes stiff, inflexible, and permanently bent. Hammer toe usually affects the second, third, or fourth toe. A hammer toe causes pain, especially when wearing shoes. Corns and calluses can result from the toe rubbing against the inside of the shoe. Early treatments to keep the toe straight may relieve pain. As the deformity of the toe becomes stiff and permanent, surgery may be needed to straighten the toe. What are the causes? This condition is caused by abnormal bending of the toe joint that is closest to your foot. Over time, the toe bending downward pulls on the muscles and connections (tendons) of the toe joint, making them weak and stiff. Wearing shoes that are too narrow in the toe box and do not allow toes to fully straighten can cause this condition. What increases the risk? You are more likely to develop this condition if you:  Are an older female.  Wear shoes that are too small, or wear high-heeled shoes that pinch your toes.  Have a second toe that is longer than your big toe (first toe).  Injure your foot or toe.  Have arthritis, or have a nerve or muscle disorder.  Have diabetes or a condition known as Charcot joint, which may cause you to walk abnormally.  Have a family history of hammer toe.  Are a ballet dancer. What are the signs or symptoms? Pain and deformity of the toe are the main symptoms of this condition. The pain is worse when wearing shoes, walking, or running. Other symptoms may include:  A thickened patch of skin, called a corn or callus, that forms over the top of the bent part of the toe or between the toes.  Redness and a burning feeling on the bent toe.  An open sore that forms on the top of the bent toe.  Not  being able to straighten the affected toe.   How is this diagnosed? This condition is diagnosed based on your symptoms and a physical exam. During the exam, your health care provider will try to straighten your toe to see how stiff the deformity is. You may also have tests, such as:  A blood test to check for rheumatoid arthritis or diabetes.  An X-ray to show how severe the toe deformity is. How is this treated? Treatment for this condition depends on whether the toe is flexible or deformed and no longer moveable. In less severe cases, a hammer toe can be straightened without surgery. These treatments include:  Taping the toe into a straightened position.  Using pads and cushions to protect the bent toe.  Wearing shoes that provide enough room for the toes.  Doing toe-stretching exercises at home.  Taking an NSAID, such as ibuprofen, to reduce pain and swelling.  Using special orthotics or insoles for pain relief and to improve walking. If these treatments do not help or the toe has a severe deformity and cannot be straightened, surgery is the next option. The most common surgeries used to straighten a hammer toe include:  Arthroplasty or osteotomy. Part of the toe joint is reconstructed or removed, which allows the toe to straighten.  Fusion. Cartilage between the two bones of the joint is taken out, and the bones are fused together into one   longer bone.  Implantation. Part of the bone is removed and replaced with an implant to allow the toe to move again.  Flexor tendon transfer. The tendons that curl the toes down (flexor tendons) are repositioned. Follow these instructions at home:  Take over-the-counter and prescription medicines only as told by your health care provider.  Do toe-straightening and stretching exercises as told by your health care provider.  Keep all follow-up visits. This is important. How is this prevented?  Wear shoes that fit properly and give your toes  enough room. Shoes should not cause pain.  Buy shoes at the end of the day to make sure they fit well, since your foot may swell during the day. Make sure they are comfortable before you buy them.  As you age, your shoe size might change, including the width. Measure both feet and buy shoes for the larger foot.  A shoe repair store might be able to stretch shoes that feel tight in spots.  Do not wear high-heeled shoes or shoes with pointed toes. Contact a health care provider if:  Your pain gets worse.  Your toe becomes red or swollen.  You develop an open sore on your toe. Summary  Hammer toe is a condition that gradually causes your toe to become bent and stiff.  Hammer toe can be treated by taping the toe into a straightened position and doing toe-stretching exercises. If these treatments do not help, surgery may be needed.  To prevent this condition, wear shoes that fit properly, give your toes enough room, and do not cause pain. This information is not intended to replace advice given to you by your health care provider. Make sure you discuss any questions you have with your health care provider. Document Revised: 08/04/2019 Document Reviewed: 08/04/2019 Elsevier Patient Education  2021 [employer].  

## 2020-06-04 DIAGNOSIS — H40013 Open angle with borderline findings, low risk, bilateral: Secondary | ICD-10-CM | POA: Diagnosis not present

## 2020-06-26 ENCOUNTER — Ambulatory Visit: Payer: Medicare PPO | Admitting: Podiatry

## 2020-07-11 ENCOUNTER — Telehealth: Payer: Self-pay | Admitting: Neurology

## 2020-07-11 NOTE — Telephone Encounter (Signed)
Pt called wanting to speak to the RN or provider about the discomfort she is feeling on the side of her head. Pt could not say if the pain was throbbing, burning etc. All she could say was it was not comfortable. Please advise.

## 2020-07-12 NOTE — Telephone Encounter (Signed)
Returned patient back, has had this pain x2 days.  It is better today but she still has the discomfort on left side of head, between ear and middle of head.  No vision changes, pain does not change with light or sounds.  She has had a lot of additional stress from surgery with her husband to a death in the family.

## 2020-07-12 NOTE — Telephone Encounter (Signed)
I called the patient.  Over the last 2 days she has had a pressure type sensation in the temporal area on the left, no pain into the ear.  She denies any visual complaints or nausea.  She has had this similar type of head pressure before but it is always gone away.  She denies any other new symptoms.  We have just recently done MRI of the brain, within the last several months she had a sedimentation rate that was 2.  I would take Tylenol or Advil now for the headache, if the headache does not improve or worsens over the next week, she is to call me back.

## 2020-08-20 ENCOUNTER — Other Ambulatory Visit: Payer: Self-pay | Admitting: Internal Medicine

## 2020-08-20 ENCOUNTER — Other Ambulatory Visit: Payer: Self-pay

## 2020-08-20 DIAGNOSIS — Z1231 Encounter for screening mammogram for malignant neoplasm of breast: Secondary | ICD-10-CM

## 2020-08-21 NOTE — Telephone Encounter (Signed)
I called the patient.  The headaches have improved but the patient is having recurrence of her usual vertigo.  She has done the Epley maneuvers with some benefit but that dizziness came back, she can repeat the exercises if she needs to.  She will let me know if the headache gets to be a problem again, we may try low-dose gabapentin at that time.

## 2020-08-21 NOTE — Telephone Encounter (Signed)
Pt called, still having problems. Having some discomfort on the left side, some vertigo. Would like a call from the nurse.

## 2020-08-22 ENCOUNTER — Encounter: Payer: Self-pay | Admitting: Internal Medicine

## 2020-08-22 ENCOUNTER — Ambulatory Visit (INDEPENDENT_AMBULATORY_CARE_PROVIDER_SITE_OTHER): Payer: Medicare PPO | Admitting: Internal Medicine

## 2020-08-22 ENCOUNTER — Other Ambulatory Visit: Payer: Self-pay

## 2020-08-22 VITALS — BP 122/82 | HR 81 | Temp 98.1°F | Ht 67.0 in | Wt 132.0 lb

## 2020-08-22 DIAGNOSIS — R413 Other amnesia: Secondary | ICD-10-CM

## 2020-08-22 DIAGNOSIS — E789 Disorder of lipoprotein metabolism, unspecified: Secondary | ICD-10-CM

## 2020-08-22 DIAGNOSIS — R7989 Other specified abnormal findings of blood chemistry: Secondary | ICD-10-CM

## 2020-08-22 DIAGNOSIS — Z0000 Encounter for general adult medical examination without abnormal findings: Secondary | ICD-10-CM

## 2020-08-22 DIAGNOSIS — R42 Dizziness and giddiness: Secondary | ICD-10-CM

## 2020-08-22 DIAGNOSIS — F09 Unspecified mental disorder due to known physiological condition: Secondary | ICD-10-CM

## 2020-08-22 LAB — COMPREHENSIVE METABOLIC PANEL
ALT: 19 U/L (ref 0–35)
AST: 22 U/L (ref 0–37)
Albumin: 4.3 g/dL (ref 3.5–5.2)
Alkaline Phosphatase: 69 U/L (ref 39–117)
BUN: 19 mg/dL (ref 6–23)
CO2: 29 mEq/L (ref 19–32)
Calcium: 10 mg/dL (ref 8.4–10.5)
Chloride: 106 mEq/L (ref 96–112)
Creatinine, Ser: 0.85 mg/dL (ref 0.40–1.20)
GFR: 66.75 mL/min (ref >60.00)
Glucose, Bld: 92 mg/dL (ref 70–99)
Potassium: 3.8 mEq/L (ref 3.5–5.1)
Sodium: 141 mEq/L (ref 135–145)
Total Bilirubin: 0.6 mg/dL (ref 0.2–1.2)
Total Protein: 7.5 g/dL (ref 6.0–8.3)

## 2020-08-22 LAB — CBC WITH DIFFERENTIAL/PLATELET
Basophils Absolute: 0 10*3/uL (ref 0.0–0.1)
Basophils Relative: 0.9 % (ref 0.0–3.0)
Eosinophils Absolute: 0.1 10*3/uL (ref 0.0–0.7)
Eosinophils Relative: 1.6 % (ref 0.0–5.0)
HCT: 39.8 % (ref 36.0–46.0)
Hemoglobin: 13.4 g/dL (ref 12.0–15.0)
Lymphocytes Relative: 18 % (ref 12.0–46.0)
Lymphs Abs: 0.7 10*3/uL (ref 0.7–4.0)
MCHC: 33.7 g/dL (ref 30.0–36.0)
MCV: 95.5 fl (ref 78.0–100.0)
Monocytes Absolute: 0.3 10*3/uL (ref 0.1–1.0)
Monocytes Relative: 8.3 % (ref 3.0–12.0)
Neutro Abs: 2.8 10*3/uL (ref 1.4–7.7)
Neutrophils Relative %: 71.2 % (ref 43.0–77.0)
Platelets: 290 10*3/uL (ref 150.0–400.0)
RBC: 4.17 Mil/uL (ref 3.87–5.11)
RDW: 14.3 % (ref 11.5–15.5)
WBC: 3.9 10*3/uL — ABNORMAL LOW (ref 4.0–10.5)

## 2020-08-22 LAB — LIPID PANEL
Cholesterol: 208 mg/dL — ABNORMAL HIGH (ref 0–200)
HDL: 86.4 mg/dL (ref >39.00)
LDL Cholesterol: 112 mg/dL — ABNORMAL HIGH (ref 0–99)
NonHDL: 121.71
Total CHOL/HDL Ratio: 2
Triglycerides: 51 mg/dL (ref 0.0–149.0)
VLDL: 10.2 mg/dL (ref 0.0–40.0)

## 2020-08-22 LAB — URINALYSIS
Bilirubin Urine: NEGATIVE
Hgb urine dipstick: NEGATIVE
Ketones, ur: NEGATIVE
Leukocytes,Ua: NEGATIVE
Nitrite: NEGATIVE
Specific Gravity, Urine: >=1.03 — AB (ref 1.000–1.030)
Urine Glucose: NEGATIVE
Urobilinogen, UA: 0.2 (ref 0.0–1.0)
pH: 5.5 (ref 5.0–8.0)

## 2020-08-22 LAB — TSH: TSH: 5.41 u[IU]/mL — ABNORMAL HIGH (ref 0.35–4.50)

## 2020-08-22 MED ORDER — DONEPEZIL HCL 5 MG PO TABS: 5.0000 mg | ORAL_TABLET | Freq: Every day | ORAL | 1 refills | Status: DC

## 2020-08-22 MED ORDER — MECLIZINE HCL 12.5 MG PO TABS: 12.5000 mg | ORAL_TABLET | Freq: Three times a day (TID) | ORAL | 1 refills | Status: DC | PRN

## 2020-08-22 NOTE — Assessment & Plan Note (Signed)
Worse, start Aricept

## 2020-08-22 NOTE — Patient Instructions (Signed)

## 2020-08-22 NOTE — Assessment & Plan Note (Addendum)
Not better.  Continue with lions mane Start Aricept  5 mg to start now

## 2020-08-22 NOTE — Assessment & Plan Note (Signed)
Recurrent Benign Positional Vertigo symptoms Start Meclizine. Start Laruth Bouchard - Daroff exercise several times a day as dirrected.

## 2020-08-22 NOTE — Assessment & Plan Note (Addendum)
  We discussed age appropriate health related issues, including available/recomended screening tests and vaccinations. Labs were ordered to be later reviewed . All questions were answered. We discussed one or more of the following - seat belt use, use of sunscreen/sun exposure exercise, safe sex, fall risk reduction, second hand smoke exposure, firearm use and storage, seat belt use, a need for adhering to healthy diet and exercise. Labs were ordered.  All questions were answered.  cardiac CT scan for calcium scoring ordered

## 2020-08-22 NOTE — Assessment & Plan Note (Signed)
cardiac CT scan for calcium scoring offered 

## 2020-08-22 NOTE — Progress Notes (Signed)
Subjective:  Patient ID: Wendy Serrano, female    DOB: May 24, 1945  Age: 75 y.o. MRN: [id number]  CC: Annual Exam   HPI [name] presents for a well exam C/o poor focus - not taking Aricept yet C/o vertigo  Outpatient Medications Prior to Visit  Medication Sig Dispense Refill  . aspirin 81 MG chewable tablet Chew 81 mg by mouth daily.    [name] b complex vitamins tablet Take 1 tablet by mouth daily. 100 tablet 3  . calcium-vitamin D (OSCAL WITH D) 500-200 MG-UNIT per tablet Take 1 tablet by mouth 2 (two) times daily.    . folic acid (FOLVITE) 1 MG tablet Take 5 mg by mouth daily.     . Garlic TABS Take 2 tablets by mouth 2 (two) times daily. Kyolic    . glucosamine-chondroitin 500-400 MG tablet Take 1 tablet by mouth 2 (two) times daily.    . hydrocortisone valerate cream (WESTCORT) 0.2 % Apply topically 2 (two) times daily. 45 g 2  . methotrexate (RHEUMATREX) 2.5 MG tablet TAKE 8 TABLETS BY MOUTH ONCE WEEKLY AS DIRECTED  3  . Multiple Vitamin (MULTIVITAMIN) tablet Take 1 tablet by mouth daily.    . Multiple Vitamins-Minerals (OCUVITE ADULT 50+) CAPS Take 1 capsule by mouth daily.    . prednisoLONE acetate (PRED FORTE) 1 % ophthalmic suspension Place 1 drop into both eyes 2 (two) times daily.     [name] SIMBRINZA 1-0.2 % SUSP Place 1 drop into both eyes 3 (three) times daily.  6  . donepezil (ARICEPT) 5 MG tablet Take 1 tablet (5 mg total) by mouth at bedtime. (Patient not taking: Reported on 08/22/2020) 90 tablet 1   No facility-administered medications prior to visit.    ROS: Review of Systems  Constitutional: Negative for activity change, appetite change, chills, fatigue and unexpected weight change.  HENT: Negative for congestion, mouth sores and sinus pressure.   Eyes: Negative for visual disturbance.  Respiratory: Negative for cough and chest tightness.   Gastrointestinal: Negative for abdominal pain and nausea.  Genitourinary: Negative for difficulty urinating, frequency  and vaginal pain.  Musculoskeletal: Negative for back pain and gait problem.  Skin: Negative for pallor and rash.  Neurological: Positive for dizziness. Negative for tremors, weakness, numbness and headaches.  Psychiatric/Behavioral: Positive for decreased concentration. Negative for confusion, sleep disturbance and suicidal ideas.    Objective:  BP 122/82 (BP Location: Left Arm)   Pulse 81   Temp 98.1 F (36.7 C) (Oral)   Ht 5\' 7"  (1.702 m)   Wt 132 lb (59.9 kg)   SpO2 97%   BMI 20.67 kg/m   BP Readings from Last 3 Encounters:  08/22/20 122/82  05/10/20 122/86  05/03/20 119/72    Wt Readings from Last 3 Encounters:  08/22/20 132 lb (59.9 kg)  05/10/20 135 lb (61.2 kg)  05/03/20 136 lb 12.8 oz (62.1 kg)    Physical Exam Constitutional:      General: She is not in acute distress.    Appearance: She is well-developed.  HENT:     Head: Normocephalic.     Right Ear: External ear normal.     Left Ear: External ear normal.     Nose: Nose normal.  Eyes:     General:        Right eye: No discharge.        Left eye: No discharge.     Conjunctiva/sclera: Conjunctivae normal.     Pupils: Pupils are equal, round, and  reactive to light.  Neck:     Thyroid: No thyromegaly.     Vascular: No JVD.     Trachea: No tracheal deviation.  Cardiovascular:     Rate and Rhythm: Normal rate and regular rhythm.     Heart sounds: Normal heart sounds.  Pulmonary:     Effort: No respiratory distress.     Breath sounds: No stridor. No wheezing.  Abdominal:     General: Bowel sounds are normal. There is no distension.     Palpations: Abdomen is soft. There is no mass.     Tenderness: There is no abdominal tenderness. There is no guarding or rebound.  Musculoskeletal:        General: No tenderness.     Cervical back: Normal range of motion and neck supple.  Lymphadenopathy:     Cervical: No cervical adenopathy.  Skin:    Findings: No erythema or rash.  Neurological:     Mental  Status: She is oriented to person, place, and time.     Cranial Nerves: No cranial nerve deficit.     Motor: No abnormal muscle tone.     Coordination: Coordination normal.     Deep Tendon Reflexes: Reflexes normal.  Psychiatric:        Behavior: Behavior normal.        Thought Content: Thought content normal.        Judgment: Judgment normal.    I spent 22 minutes in addition to time for CPX wellness examination in preparing to see the patient by review of recent labs, imaging and procedures, obtaining and reviewing separately obtained history, communicating with the patient, ordering medications, tests or procedures, and documenting clinical information in the EHR including the differential diagnosis, treatment, and any further evaluation and other management of  vertigo, memory loss.         Lab Results  Component Value Date   WBC 4.6 06/16/2019   HGB 13.1 06/16/2019   HCT 39.3 06/16/2019   PLT 272.0 06/16/2019   GLUCOSE 97 06/16/2019   CHOL 227 (H) 06/16/2019   TRIG 31.0 06/16/2019   HDL 96.00 06/16/2019   LDLDIRECT 127.9 03/09/2013   LDLCALC 125 (H) 06/16/2019   ALT 19 06/16/2019   AST 23 06/16/2019   NA 141 06/16/2019   K 4.0 06/16/2019   CL 105 06/16/2019   CREATININE 0.81 06/16/2019   BUN 17 06/16/2019   CO2 29 06/16/2019   TSH 6.85 (H) 06/16/2019   INR 1.1 (H) 05/16/2015   HGBA1C 5.4 02/26/2011    MR BRAIN WO CONTRAST  Result Date: 05/17/2020  [facility] [address], [place], [place] [address] [phone] NEUROIMAGING REPORT STUDY DATE: 05/17/2020 PATIENT NAME: Wendy Serrano DOB: [birth year] MRN: [id number] EXAM: MRI Brain without contrast ORDERING CLINICIAN: Kathrynn Ducking, MD CLINICAL HISTORY: 75 year old woman with mild cognitive impairment COMPARISON FILMS: MRI 01/13/2018 TECHNIQUE: MRI of the brain without contrast was obtained utilizing 5 mm axial slices with T1, T2, T2 flair, SWI and diffusion weighted views.  T1 sagittal and T2  coronal views were obtained. CONTRAST: none IMAGING SITE: [facility], [place], [place] FINDINGS: On sagittal images, the spinal cord is imaged caudally to C3 and is normal in caliber.   The contents of the posterior fossa are of normal size and position.   The pituitary gland and optic chiasm appear normal.    There is mild generalized cortical atrophy, unchanged compared to the 2019 MRI.  There are no  abnormal extra-axial collections of fluid.  In the hemispheres, there are single and confluent T2/FLAIR hyperintense foci predominantly in the subcortical and deep white matter.  Confluencies are noted in the periatrial white matter and the left frontal centrum semiovale.  None of the foci appear to be acute and there is no definite progression compared to the 01/13/2018 MRI.  A couple small chronic T2 hyperintense foci are noted in the pons and cerebellar hemispheres..  Diffusion weighted images are normal.  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  There have been bilateral lens replacements.  Otherwise, the orbits appear normal.   The VIIth/VIIIth nerve complex appears normal.  The mastoid air cells appear normal.  The paranasal sinuses appear normal.  Flow voids are identified within the major intracerebral arteries.     This MRI of the brain without contrast shows the following: 1.   Mild generalized cortical atrophy, probably normal for age and unchanged compared to the 2019 MRI. 2.   Scattered T2/FLAIR hyperintense single and confluent foci predominantly in the hemispheres and a few foci in the pons and cerebellum.  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  None of the foci appear to be acute and there is no progression compared to the 2019 MRI. 3.   No acute findings. INTERPRETING PHYSICIAN: Richard A. Felecia Shelling, MD, PhD, FAAN Certified in  Neuroimaging by [employer]  [employer]    Assessment & Plan:    Walker Kehr, MD

## 2020-08-23 ENCOUNTER — Other Ambulatory Visit (INDEPENDENT_AMBULATORY_CARE_PROVIDER_SITE_OTHER): Payer: Medicare PPO

## 2020-08-23 DIAGNOSIS — R7989 Other specified abnormal findings of blood chemistry: Secondary | ICD-10-CM

## 2020-08-23 LAB — T4, FREE: Free T4: 0.78 ng/dL (ref 0.60–1.60)

## 2020-08-30 ENCOUNTER — Telehealth: Payer: Self-pay | Admitting: Internal Medicine

## 2020-08-30 NOTE — Telephone Encounter (Signed)
[facility] Report/Call: ---Caller states wife tested positive for Covid. Caller states her only symptom is a runny nose. Denies any fever or other symptoms.  Call on call provider : Per oncall, he will follow up on patient in the morning. In the meantime, pt to take Zinc 50mg , Vit C, Vit D, clartin, and if symptomatic with fever or aches and pains, Tylenol

## 2020-09-01 DIAGNOSIS — Z20822 Contact with and (suspected) exposure to covid-19: Secondary | ICD-10-CM | POA: Diagnosis not present

## 2020-09-03 NOTE — Telephone Encounter (Signed)
Patient spouse calling today to report patient is doing well, but he has additional questions regarding follow up care for Covid and advice  Spouse also advised follow CDC Guidelines  Please call

## 2020-09-03 NOTE — Telephone Encounter (Signed)
Called husband.. he states he wa not w/wife to cal her at home. Called wife she states she was just curious abt the covid test. She states she and her husband tested postive at the pharmacy, but whn they took a home test it came back negative. Wanting to know if that could be right. Inform pt it could have.. Once of them could have been a false post ive. [name] states MD recommend some vitamins and wanting to know how long she should take. Verified if she was having any sxs from the covid. Pt states she is only having th drainage, but nothing else, and she feels ok. Inform pt MD recommend the vitamins for 7 days, and since she is having the drainage th Claritin should help.Marland KitchenJohny Chess

## 2020-09-05 ENCOUNTER — Encounter: Payer: Self-pay | Admitting: Internal Medicine

## 2020-09-21 DIAGNOSIS — H35372 Puckering of macula, left eye: Secondary | ICD-10-CM | POA: Diagnosis not present

## 2020-09-21 DIAGNOSIS — H35363 Drusen (degenerative) of macula, bilateral: Secondary | ICD-10-CM | POA: Diagnosis not present

## 2020-09-21 DIAGNOSIS — Z79899 Other long term (current) drug therapy: Secondary | ICD-10-CM | POA: Diagnosis not present

## 2020-09-21 DIAGNOSIS — H35351 Cystoid macular degeneration, right eye: Secondary | ICD-10-CM | POA: Diagnosis not present

## 2020-09-21 DIAGNOSIS — H40043 Steroid responder, bilateral: Secondary | ICD-10-CM | POA: Diagnosis not present

## 2020-09-21 DIAGNOSIS — H209 Unspecified iridocyclitis: Secondary | ICD-10-CM | POA: Diagnosis not present

## 2020-09-22 ENCOUNTER — Other Ambulatory Visit: Payer: Self-pay | Admitting: Internal Medicine

## 2020-10-10 ENCOUNTER — Other Ambulatory Visit: Payer: Self-pay

## 2020-10-10 ENCOUNTER — Ambulatory Visit (INDEPENDENT_AMBULATORY_CARE_PROVIDER_SITE_OTHER)
Admission: RE | Admit: 2020-10-10 | Discharge: 2020-10-10 | Disposition: A | Discharge status: Home / Self Care | Payer: Self-pay | Source: Ambulatory Visit | Attending: Internal Medicine | Admitting: Internal Medicine

## 2020-10-10 DIAGNOSIS — E789 Disorder of lipoprotein metabolism, unspecified: Secondary | ICD-10-CM

## 2020-10-11 ENCOUNTER — Ambulatory Visit: Payer: Medicare PPO

## 2020-10-11 ENCOUNTER — Encounter: Payer: Self-pay | Admitting: Internal Medicine

## 2020-10-11 DIAGNOSIS — Z1231 Encounter for screening mammogram for malignant neoplasm of breast: Secondary | ICD-10-CM | POA: Diagnosis not present

## 2020-10-11 DIAGNOSIS — I700 Atherosclerosis of aorta: Secondary | ICD-10-CM | POA: Insufficient documentation

## 2020-10-13 ENCOUNTER — Ambulatory Visit: Payer: Medicare PPO

## 2020-10-15 DIAGNOSIS — Z01419 Encounter for gynecological examination (general) (routine) without abnormal findings: Secondary | ICD-10-CM | POA: Diagnosis not present

## 2020-10-15 DIAGNOSIS — N952 Postmenopausal atrophic vaginitis: Secondary | ICD-10-CM | POA: Diagnosis not present

## 2020-10-15 DIAGNOSIS — Z780 Asymptomatic menopausal state: Secondary | ICD-10-CM | POA: Diagnosis not present

## 2020-10-31 NOTE — Progress Notes (Signed)
PATIENT: Tieisha Darden DOB: [birth year]  REASON FOR VISIT: follow up HISTORY FROM: patient Primary Neurologist: Dr. Jannifer Franklin  Today 11/01/20 Ms. Fojtik is a 75 year old female with history of memory issues.  MRI of the brain showed moderate level small vessel disease, on aspirin 81 mg daily.  Didn't start taking the Aricept. MMSE 23/30 today. Here today alone. Still trouble with short term memory. She missed 2 of the word recall. Husband says isn't concentrating. Is very organized person. Keeps her household. Is still driving, share a car with her husband. Keeps up with medications and appointments. She was able to tell her husband how to get here today, he felt comfortable with her driving. She enjoys reading, church, watching TV. Vertigo is better. Here today alone.   HISTORY Ms. 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  The patient comes in to the office reporting some problems with short-term memory that began sometime within the last year.  She denies any problems with word finding or problems with names of people or things.  She does repeat herself at times.  She has misplaced things about the house on occasion.  She claims that coming to the office today she got lost getting here.  She indicates that her husband does most of the finances, she is able to keep up with her own medications and appointments.  She gets adequate rest, she tends to go to bed around 3 AM and sleeps to about 1 PM but this is her usual pattern.  She denies any family history of memory problems.  She denies issues with numbness or weakness of the extremities or difficulty with gait instability.  She has been taking an over-the-counter supplement of lion mushroom for memory.  She comes here for further evaluation.   REVIEW OF SYSTEMS: Out of a complete 14 system review of symptoms, the patient complains only of the following symptoms,  and all other reviewed systems are negative.  Memory issues   ALLERGIES: No Known Allergies  HOME MEDICATIONS: Outpatient Medications Prior to Visit  Medication Sig Dispense Refill   aspirin 81 MG chewable tablet Chew 81 mg by mouth daily.     b complex vitamins tablet Take 1 tablet by mouth daily. 100 tablet 3   calcium-vitamin D (OSCAL WITH D) 500-200 MG-UNIT per tablet Take 1 tablet by mouth 2 (two) times daily.     donepezil (ARICEPT) 5 MG tablet Take 1 tablet (5 mg total) by mouth at bedtime. 90 tablet 1   folic acid (FOLVITE) 1 MG tablet Take 5 mg by mouth daily.      Garlic TABS Take 2 tablets by mouth 2 (two) times daily. Kyolic     glucosamine-chondroitin 500-400 MG tablet Take 1 tablet by mouth 2 (two) times daily.     hydrocortisone valerate cream (WESTCORT) 0.2 % Apply topically 2 (two) times daily. 45 g 2   meclizine (ANTIVERT) 12.5 MG tablet TAKE 1 TABLET BY MOUTH 3 TIMES DAILY AS NEEDED FOR DIZZINESS. 60 tablet 1   methotrexate (RHEUMATREX) 2.5 MG tablet TAKE 8 TABLETS BY MOUTH ONCE WEEKLY AS DIRECTED  3   Multiple Vitamin (MULTIVITAMIN) tablet Take 1 tablet by mouth daily.     Multiple Vitamins-Minerals (OCUVITE ADULT 50+) CAPS Take 1 capsule by mouth daily.     prednisoLONE acetate (PRED FORTE) 1 % ophthalmic suspension Place 1 drop into both eyes 2 (two) times daily.  SIMBRINZA 1-0.2 % SUSP Place 1 drop into both eyes 3 (three) times daily.  6   No facility-administered medications prior to visit.    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 '11   early and  mild and stable   Menopausal symptoms    Renal cyst    bilateral   Rotator cuff tear    Yeast infection     PAST SURGICAL HISTORY: Past Surgical History:  Procedure Laterality Date   ROTATOR CUFF REPAIR Left 08/2009   Left shoulder-rotator cuff repair [facility])   WISDOM TOOTH EXTRACTION      FAMILY HISTORY: Family History  Problem Relation Age of Onset   Prostate cancer Father        died   Pancreatic cancer Mother        died   Diabetes Mother    Hyperlipidemia Sister     SOCIAL HISTORY: Social History   Socioeconomic History   Marital status: Married    Spouse name: Not on file   Number of children: 0   Years of education: Not on file   Highest education level: Not on file  Occupational History   Occupation: Retired  Tobacco Use   Smoking status: Never   Smokeless tobacco: Never  Substance and Sexual Activity   Alcohol use: No   Drug use: No   Sexual activity: Never    Birth control/protection: Post-menopausal  Other Topics Concern   Not on file  Social History Narrative   [employer] BA; MEd-teaching;MEd Administration @ [employer]   Married '67   Has 11 children who were foster care or mentored   Retired   SO retired, Has had some health problems. They are doing well   Marriage is in good health and retirement is good   End-of-life: provided packet of information and forms for consideration (Oct'38)   Social Determinants of Health   Financial Resource Strain: Low Risk    Difficulty of Paying Living Expenses: Not hard at all  Food Insecurity: No Food Insecurity   Worried About Charity fundraiser in the Last Year: Never true   [place] in the Last Year: Never true  Transportation Needs: No Transportation Needs   Lack of Transportation (Medical): No   Lack of Transportation (Non-Medical): No  Physical Activity: Sufficiently Active   Days of Exercise per Week: 5 days   Minutes of Exercise per Session: 30 min  Stress: No Stress Concern Present    Feeling of Stress : Not at all  Social Connections: Socially Integrated   Frequency of Communication with Friends and Family: More than three times a week   Frequency of Social Gatherings with Friends and Family: More than three times a week   Attends Religious Services: More than 4 times per year   Active Member of [employer] or Organizations: Yes   Attends Music therapist: More than 4 times per year   Marital Status: Married  Human resources officer Violence: Not At Risk  Fear of Current or Ex-Partner: No   Emotionally Abused: No   Physically Abused: No   Sexually Abused: No   PHYSICAL EXAM  Vitals:   11/01/20 1002  BP: 113/68  Pulse: 80  Weight: 131 lb (59.4 kg)  Height: 5\' 8"  (1.727 m)   Body mass index is 19.92 kg/m.  Generalized: Well developed, in no acute distress  MMSE - Mini Mental State Exam 11/01/2020 05/03/2020 11/09/2018  Orientation to time 4 5 4   Orientation to Place 5 5 5   Registration 3 3 3   Attention/ Calculation 1 5 5   Recall 1 2 1   Language- name 2 objects 2 2 2   Language- repeat 1 1 1   Language- follow 3 step command 3 3 3   Language- read & follow direction 1 1 1   Write a sentence 1 1 1   Copy design 1 1 0  Total score 23 29 26     Neurological examination  Mentation: Alert oriented to time, place, history taking. Follows all commands speech and language fluent. Does repeat herself.  Cranial nerve II-XII: Pupils were equal round reactive to light. Extraocular movements were full, visual field were full on confrontational test. Facial sensation and strength were normal. Head turning and shoulder shrug  were normal and symmetric. Motor: The motor testing reveals 5 over 5 strength of all 4 extremities. Good symmetric motor tone is noted throughout.  Sensory: Sensory testing is intact to soft touch on all 4 extremities. No evidence of extinction is noted.  Coordination: Cerebellar testing reveals good finger-nose-finger and heel-to-shin bilaterally.   Gait and station: Gait is normal.  Reflexes: Deep tendon reflexes are symmetric and normal bilaterally.   DIAGNOSTIC DATA (LABS, IMAGING, TESTING) - I reviewed patient records, labs, notes, testing and imaging myself where available.  Lab Results  Component Value Date   WBC 3.9 (L) 08/22/2020   HGB 13.4 08/22/2020   HCT 39.8 08/22/2020   MCV 95.5 08/22/2020   PLT 290.0 08/22/2020      Component Value Date/Time   NA 141 08/22/2020 1108   K 3.8 08/22/2020 1108   CL 106 08/22/2020 1108   CO2 29 08/22/2020 1108   GLUCOSE 92 08/22/2020 1108   BUN 19 08/22/2020 1108   CREATININE 0.85 08/22/2020 1108   CALCIUM 10.0 08/22/2020 1108   PROT 7.5 08/22/2020 1108   ALBUMIN 4.3 08/22/2020 1108   AST 22 08/22/2020 1108   ALT 19 08/22/2020 1108   ALKPHOS 69 08/22/2020 1108   BILITOT 0.6 08/22/2020 1108   GFRNONAA 111.51 02/19/2010 1100   GFRAA 81 02/02/2008 0955   Lab Results  Component Value Date   CHOL 208 (H) 08/22/2020   HDL 86.40 08/22/2020   LDLCALC 112 (H) 08/22/2020   LDLDIRECT 127.9 03/09/2013   TRIG 51.0 08/22/2020   CHOLHDL 2 08/22/2020   Lab Results  Component Value Date   HGBA1C 5.4 02/26/2011   Lab Results  Component Value Date   VITAMINB12 1,415 (H) 05/03/2020   Lab Results  Component Value Date   TSH 5.41 (H) 08/22/2020   ASSESSMENT AND PLAN 75 y.o. 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 difficile diarrhea (06/2017), Cervicogenic headache (09/21/2019), Eczema, H/O osteopenia (01/09/04), H/O osteoporosis (2008), H/O varicella, Herpes simplex without mention of complication, History of measles, mumps, or rubella, Leukopenia, Liver hemangioma, Macular degeneration (oct '11), Menopausal symptoms, Renal cyst, Rotator cuff tear, and Yeast infection. here with:  1.  Memory disturbance  -Some  decline in MMSE, was 29/30, today 23/30 -Never started Aricept, will restart 5 mg at bedtime for 1 month, then  increase to 10 mg at bedtime -Encouraged her to bring her husband at next visit for collateral information -MRI of the brain showed moderate level small vessel disease, remains on aspirin 81 mg daily -Have recommended exercise, consider Silver sneakers -Follow-up in 6 months or sooner if needed  Evangeline Dakin, DNP 11/01/2020, 10:11 AM [facility] [address] [place], [place] [address] [phone]

## 2020-11-01 ENCOUNTER — Encounter: Payer: Self-pay | Admitting: Neurology

## 2020-11-01 ENCOUNTER — Ambulatory Visit: Payer: Medicare PPO | Admitting: Neurology

## 2020-11-01 ENCOUNTER — Other Ambulatory Visit: Payer: Self-pay

## 2020-11-01 VITALS — BP 113/68 | HR 80 | Ht 68.0 in | Wt 131.0 lb

## 2020-11-01 DIAGNOSIS — F09 Unspecified mental disorder due to known physiological condition: Secondary | ICD-10-CM | POA: Diagnosis not present

## 2020-11-01 DIAGNOSIS — R413 Other amnesia: Secondary | ICD-10-CM

## 2020-11-01 MED ORDER — DONEPEZIL HCL 10 MG PO TABS: ORAL_TABLET | ORAL | 1 refills | Status: DC

## 2020-11-01 NOTE — Progress Notes (Signed)
I have read the note, and I agree with the clinical assessment and plan.  Toua Stites K Kemora Pinard   

## 2020-11-01 NOTE — Patient Instructions (Addendum)
Start taking Aricept 5 mg at bedtime x 1 month, then increase to 10 mg at bedtime  Recommend exercise, activity; may consider silver sneakers Driving needs to be closely monitored Please bring your husband next time See you back in 6 months

## 2020-11-14 ENCOUNTER — Telehealth: Payer: Self-pay | Admitting: Neurology

## 2020-11-14 NOTE — Telephone Encounter (Signed)
Pt is asking for a call from Dr Jannifer Franklin to discuss why she is on donepezil (ARICEPT) 10 MG tablet and other concerns she has about this medication.

## 2020-11-14 NOTE — Telephone Encounter (Signed)
I called the patient.  I discussed the reason why the donepezil is being ordered for her.  The patient also wants to know what physician will be taking over her care once I leave.  I indicated that I will probably let her be seen by Dr. Brett Fairy in the future.

## 2020-11-15 ENCOUNTER — Telehealth: Payer: Self-pay | Admitting: Internal Medicine

## 2020-11-15 NOTE — Telephone Encounter (Signed)
  Seeking advice  Patient calling for advice on " difficulty swallowing medications" Appointment scheduled for 7/8   Please call

## 2020-11-15 NOTE — Telephone Encounter (Signed)
Noted../lmb 

## 2020-11-16 ENCOUNTER — Other Ambulatory Visit: Payer: Self-pay

## 2020-11-16 ENCOUNTER — Encounter: Payer: Self-pay | Admitting: Internal Medicine

## 2020-11-16 ENCOUNTER — Ambulatory Visit: Payer: Medicare PPO | Admitting: Internal Medicine

## 2020-11-16 DIAGNOSIS — R413 Other amnesia: Secondary | ICD-10-CM | POA: Diagnosis not present

## 2020-11-16 DIAGNOSIS — H8112 Benign paroxysmal vertigo, left ear: Secondary | ICD-10-CM | POA: Diagnosis not present

## 2020-11-16 DIAGNOSIS — H209 Unspecified iridocyclitis: Secondary | ICD-10-CM

## 2020-11-16 DIAGNOSIS — F09 Unspecified mental disorder due to known physiological condition: Secondary | ICD-10-CM

## 2020-11-16 MED ORDER — DONEPEZIL HCL 10 MG PO TABS: 10.0000 mg | ORAL_TABLET | Freq: Every day | ORAL | 3 refills | Status: DC

## 2020-11-16 NOTE — Patient Instructions (Signed)
Try TENs unit for back pain

## 2020-11-16 NOTE — Progress Notes (Signed)
Subjective:  Patient ID: Wendy Serrano, female    DOB: 09-25-1945  Age: 75 y.o. MRN: [id number]  CC: Dysphagia (Pt states she had 1 episode where a pill got stuck in her throat. Haven't had the problem since,but was told to f/u w/ PCP)   HPI Sullivan Lone presents for memory loss C/o leg cramps. Ariahna has been breaking Aricept tablets in half.  Once she choked on a half of a tablet.  This has resolved with drinking water  Outpatient Medications Prior to Visit  Medication Sig Dispense Refill   aspirin 81 MG chewable tablet Chew 81 mg by mouth daily.     b complex vitamins tablet Take 1 tablet by mouth daily. 100 tablet 3   calcium-vitamin D (OSCAL WITH D) 500-200 MG-UNIT per tablet Take 1 tablet by mouth 2 (two) times daily.     donepezil (ARICEPT) 10 MG tablet Start taking 1/2 tablet at bedtime for 1 month, then take 1 tablet at bedtime 90 tablet 1   folic acid (FOLVITE) 1 MG tablet Take 5 mg by mouth daily.      Garlic TABS Take 2 tablets by mouth 2 (two) times daily. Kyolic     glucosamine-chondroitin 500-400 MG tablet Take 1 tablet by mouth 2 (two) times daily.     methotrexate (RHEUMATREX) 2.5 MG tablet TAKE 8 TABLETS BY MOUTH ONCE WEEKLY AS DIRECTED  3   Multiple Vitamin (MULTIVITAMIN) tablet Take 1 tablet by mouth daily.     Multiple Vitamins-Minerals (OCUVITE ADULT 50+) CAPS Take 1 capsule by mouth daily.     prednisoLONE acetate (PRED FORTE) 1 % ophthalmic suspension Place 1 drop into both eyes 2 (two) times daily.      SIMBRINZA 1-0.2 % SUSP Place 1 drop into both eyes 3 (three) times daily.  6   hydrocortisone valerate cream (WESTCORT) 0.2 % Apply topically 2 (two) times daily. (Patient not taking: Reported on 11/16/2020) 45 g 2   meclizine (ANTIVERT) 12.5 MG tablet TAKE 1 TABLET BY MOUTH 3 TIMES DAILY AS NEEDED FOR DIZZINESS. (Patient not taking: Reported on 11/16/2020) 60 tablet 1   No facility-administered medications prior to visit.    ROS: Review of Systems   Constitutional:  Negative for activity change, appetite change, chills, fatigue and unexpected weight change.  HENT:  Negative for congestion, mouth sores and sinus pressure.   Eyes:  Negative for visual disturbance.  Respiratory:  Negative for cough and chest tightness.   Gastrointestinal:  Negative for abdominal pain and nausea.  Genitourinary:  Negative for difficulty urinating, frequency and vaginal pain.  Musculoskeletal:  Negative for back pain and gait problem.  Skin:  Negative for pallor and rash.  Neurological:  Negative for dizziness, tremors, weakness, numbness and headaches.  Psychiatric/Behavioral:  Positive for sleep disturbance. Negative for confusion. The patient is nervous/anxious.    Objective:  BP 132/80 (BP Location: Left Arm)   Pulse 67   Temp 98.3 F (36.8 C) (Oral)   Ht 5\' 8"  (1.727 m)   Wt 132 lb 6.4 oz (60.1 kg)   SpO2 99%   BMI 20.13 kg/m   BP Readings from Last 3 Encounters:  11/16/20 132/80  11/01/20 113/68  08/22/20 122/82    Wt Readings from Last 3 Encounters:  11/16/20 132 lb 6.4 oz (60.1 kg)  11/01/20 131 lb (59.4 kg)  08/22/20 132 lb (59.9 kg)    Physical Exam Constitutional:      General: She is not in acute distress.  Appearance: She is well-developed.  HENT:     Head: Normocephalic.     Right Ear: External ear normal.     Left Ear: External ear normal.     Nose: Nose normal.  Eyes:     General:        Right eye: No discharge.        Left eye: No discharge.     Conjunctiva/sclera: Conjunctivae normal.     Pupils: Pupils are equal, round, and reactive to light.  Neck:     Thyroid: No thyromegaly.     Vascular: No JVD.     Trachea: No tracheal deviation.  Cardiovascular:     Rate and Rhythm: Normal rate and regular rhythm.     Heart sounds: Normal heart sounds.  Pulmonary:     Effort: No respiratory distress.     Breath sounds: No stridor. No wheezing.  Abdominal:     General: Bowel sounds are normal. There is no  distension.     Palpations: Abdomen is soft. There is no mass.     Tenderness: There is no abdominal tenderness. There is no guarding or rebound.  Musculoskeletal:        General: No tenderness.     Cervical back: Normal range of motion and neck supple. No rigidity.  Lymphadenopathy:     Cervical: No cervical adenopathy.  Skin:    Findings: No erythema or rash.  Neurological:     Mental Status: She is oriented to person, place, and time.     Cranial Nerves: No cranial nerve deficit.     Motor: No abnormal muscle tone.     Coordination: Coordination normal.     Deep Tendon Reflexes: Reflexes normal.  Psychiatric:        Behavior: Behavior normal.        Thought Content: Thought content normal.        Judgment: Judgment normal.    Lab Results  Component Value Date   WBC 3.9 (L) 08/22/2020   HGB 13.4 08/22/2020   HCT 39.8 08/22/2020   PLT 290.0 08/22/2020   GLUCOSE 92 08/22/2020   CHOL 208 (H) 08/22/2020   TRIG 51.0 08/22/2020   HDL 86.40 08/22/2020   LDLDIRECT 127.9 03/09/2013   LDLCALC 112 (H) 08/22/2020   ALT 19 08/22/2020   AST 22 08/22/2020   NA 141 08/22/2020   K 3.8 08/22/2020   CL 106 08/22/2020   CREATININE 0.85 08/22/2020   BUN 19 08/22/2020   CO2 29 08/22/2020   TSH 5.41 (H) 08/22/2020   INR 1.1 (H) 05/16/2015   HGBA1C 5.4 02/26/2011    CT CARDIAC SCORING (SELF PAY ONLY)  Addendum Date: 10/10/2020   ADDENDUM REPORT: 10/10/2020 10:17 CLINICAL DATA:  7F for cardiovascular disease risk stratification EXAM: Coronary Calcium Score TECHNIQUE: A gated, non-contrast computed tomography scan of the heart was performed using 25mm slice thickness. Axial images were analyzed on a dedicated workstation. Calcium scoring of the coronary arteries was performed using the Agatston method. FINDINGS: Coronary arteries: Normal origins. Coronary Calcium Score: 0 Percentile: 0 Pericardium: Normal. Ascending Aorta: Normal caliber. Minimal calcification of the aortic root and ascending  aorta. Non-cardiac: See separate report from [facility]. IMPRESSION: Coronary calcium score of 0. This was 0 percentile for age-, race-, and sex-matched controls. Calcification of the aortic root and ascending aorta. RECOMMENDATIONS: Coronary artery calcium (CAC) score is a strong predictor of incident coronary heart disease (CHD) and provides predictive information beyond traditional risk factors. CAC scoring  is reasonable to use in the decision to withhold, postpone, or initiate statin therapy in intermediate-risk or selected borderline-risk asymptomatic adults (age 4-75 years and LDL-C >=70 to <190 mg/dL) who do not have diabetes or established atherosclerotic cardiovascular disease (ASCVD).* In intermediate-risk (10-year ASCVD risk >=7.5% to <20%) adults or selected borderline-risk (10-year ASCVD risk >=5% to <7.5%) adults in whom a CAC score is measured for the purpose of making a treatment decision the following recommendations have been made: If CAC=0, it is reasonable to withhold statin therapy and reassess in 5 to 10 years, as long as higher risk conditions are absent (diabetes mellitus, family history of premature CHD in first degree relatives (males <55 years; females <65 years), cigarette smoking, or LDL >=190 mg/dL). If CAC is 1 to 99, it is reasonable to initiate statin therapy for patients >=24 years of age. If CAC is >=100 or >=75th percentile, it is reasonable to initiate statin therapy at any age. Cardiology referral should be considered for patients with CAC scores >=400 or >=75th percentile. *2018 AHA/ACC/AACVPR/AAPA/ABC/ACPM/ADA/AGS/APhA/ASPC/NLA/PCNA Guideline on the Management of Blood Cholesterol: A Report of the American College of Cardiology/American Heart Association Task Force on Clinical Practice Guidelines. J Am Coll Cardiol. 2019;73(24):3168-3209. Skeet Latch, MD Electronically Signed   By: Skeet Latch   On: 10/10/2020 10:17   Result Date: 10/10/2020 EXAM:  OVER-READ INTERPRETATION  CT CHEST The following report is an over-read performed by radiologist Dr. Vinnie Langton of [facility], [place] on 10/10/2020. This over-read does not include interpretation of cardiac or coronary anatomy or pathology. The coronary calcium score interpretation by the cardiologist is attached. COMPARISON:  None. FINDINGS: Aortic atherosclerosis. Within the visualized portions of the thorax there are no suspicious appearing pulmonary nodules or masses, there is no acute consolidative airspace disease, no pleural effusions, no pneumothorax and no lymphadenopathy. Visualized portions of the upper abdomen are unremarkable. There are no aggressive appearing lytic or blastic lesions noted in the visualized portions of the skeleton. IMPRESSION: 1.  Aortic Atherosclerosis (ICD10-I70.0). Electronically Signed: By: Vinnie Langton M.D. On: 10/10/2020 09:53    Assessment & Plan:     Walker Kehr, MD

## 2020-11-16 NOTE — Assessment & Plan Note (Addendum)
Wendy Serrano can continue with lions mane She has been tolerating Aricept 5 mg a day okay.  I asked her to start Aricept at a full tablet dose- take 10 mg/d to avoid choking.  I think would be easier for her to swallow whole pill.

## 2020-11-18 NOTE — Assessment & Plan Note (Signed)
Increase Aricept to 10 mg at night

## 2020-11-18 NOTE — Assessment & Plan Note (Signed)
On methotrexate and folic acid

## 2020-11-18 NOTE — Assessment & Plan Note (Signed)
Periodic flareups.  Do Brender exercises for prophylaxis/maintenance.  Use meclizine prescription as needed

## 2020-11-29 ENCOUNTER — Telehealth: Payer: Self-pay | Admitting: Neurology

## 2020-11-29 NOTE — Telephone Encounter (Signed)
Pt called, having a reaction to donepezil (ARICEPT) 10 MG tablet. ;upset stomach, difficulty swallow tablet. Would like a call from  the nurse.

## 2020-11-29 NOTE — Telephone Encounter (Signed)
I spoke to Judson Roch, NP regarding plan.  Called patient back and verified she is taking 1/2 tablet (5 mg).  Patient confirmed she is on the correct dosage, advised patient to discontinue medication and allow a couple weeks to feel better and will consider another medication to try for memory.  Patient denied further questions, verbalized understanding and expressed appreciation for the phone call.

## 2020-12-03 DIAGNOSIS — H40013 Open angle with borderline findings, low risk, bilateral: Secondary | ICD-10-CM | POA: Diagnosis not present

## 2020-12-19 ENCOUNTER — Other Ambulatory Visit: Payer: Self-pay

## 2020-12-19 ENCOUNTER — Ambulatory Visit: Payer: Medicare PPO | Admitting: Internal Medicine

## 2020-12-19 ENCOUNTER — Encounter: Payer: Self-pay | Admitting: Internal Medicine

## 2020-12-19 DIAGNOSIS — R070 Pain in throat: Secondary | ICD-10-CM | POA: Insufficient documentation

## 2020-12-19 MED ORDER — PANTOPRAZOLE SODIUM 40 MG PO TBEC: 40.0000 mg | DELAYED_RELEASE_TABLET | Freq: Every day | ORAL | 3 refills | Status: DC

## 2020-12-19 NOTE — Assessment & Plan Note (Signed)
x3-4 days: possible GERD  D/c large vitamins x 2-3 wks Start Protonix 40 mg qd GI ref if not better

## 2020-12-19 NOTE — Progress Notes (Signed)
Subjective:  Patient ID: Wendy Serrano, female    DOB: November 24, 1945  Age: 75 y.o. MRN: [id number]  CC: Follow-up (Pt states she is having trouble with swallow. She fel like something is stuck in her throat. Also having discomfort in her neck)   HPI Wendy Serrano presents for a problem swallowing ?after she started to use a standing pull down bar machine. No ST. No voice change C/o feeling like something is not right w/swallowing x 3-4 days  Outpatient Medications Prior to Visit  Medication Sig Dispense Refill   aspirin 81 MG chewable tablet Chew 81 mg by mouth daily. (Patient not taking: Reported on 12/19/2020)     b complex vitamins tablet Take 1 tablet by mouth daily. (Patient not taking: Reported on 12/19/2020) 100 tablet 3   calcium-vitamin D (OSCAL WITH D) 500-200 MG-UNIT per tablet Take 1 tablet by mouth 2 (two) times daily. (Patient not taking: Reported on 12/19/2020)     donepezil (ARICEPT) 10 MG tablet Take 1 tablet (10 mg total) by mouth at bedtime. Start taking 1/2 tablet at bedtime for 1 month, then take 1 tablet at bedtime (Patient not taking: Reported on 12/19/2020) 90 tablet 3   folic acid (FOLVITE) 1 MG tablet Take 5 mg by mouth daily.  (Patient not taking: Reported on [id number])     Garlic TABS Take 2 tablets by mouth 2 (two) times daily. Kyolic (Patient not taking: Reported on 12/19/2020)     glucosamine-chondroitin 500-400 MG tablet Take 1 tablet by mouth 2 (two) times daily. (Patient not taking: Reported on 12/19/2020)     methotrexate (RHEUMATREX) 2.5 MG tablet TAKE 8 TABLETS BY MOUTH ONCE WEEKLY AS DIRECTED (Patient not taking: Reported on 12/19/2020)  3   Multiple Vitamin (MULTIVITAMIN) tablet Take 1 tablet by mouth daily. (Patient not taking: Reported on 12/19/2020)     Multiple Vitamins-Minerals (OCUVITE ADULT 50+) CAPS Take 1 capsule by mouth daily. (Patient not taking: Reported on 12/19/2020)     prednisoLONE acetate (PRED FORTE) 1 % ophthalmic suspension Place 1 drop into  both eyes 2 (two) times daily.  (Patient not taking: Reported on 12/19/2020)     SIMBRINZA 1-0.2 % SUSP Place 1 drop into both eyes 3 (three) times daily. (Patient not taking: Reported on 12/19/2020)  6   No facility-administered medications prior to visit.    ROS: Review of Systems  Constitutional:  Negative for activity change, appetite change, chills, fatigue and unexpected weight change.  HENT:  Negative for congestion, mouth sores and sinus pressure.   Eyes:  Negative for visual disturbance.  Respiratory:  Negative for cough, chest tightness, shortness of breath and wheezing.   Gastrointestinal:  Negative for abdominal pain and nausea.  Genitourinary:  Negative for difficulty urinating, frequency and vaginal pain.  Musculoskeletal:  Negative for back pain and gait problem.  Skin:  Negative for pallor and rash.  Neurological:  Negative for dizziness, tremors, weakness, numbness and headaches.  Psychiatric/Behavioral:  Negative for confusion and sleep disturbance.    Objective:  BP 122/68 (BP Location: Left Arm)   Pulse 71   Temp 98.5 F (36.9 C) (Oral)   SpO2 98%   BP Readings from Last 3 Encounters:  12/19/20 122/68  11/16/20 132/80  11/01/20 113/68    Wt Readings from Last 3 Encounters:  11/16/20 132 lb 6.4 oz (60.1 kg)  11/01/20 131 lb (59.4 kg)  08/22/20 132 lb (59.9 kg)    Physical Exam Constitutional:      General: She is  not in acute distress.    Appearance: She is well-developed.  HENT:     Head: Normocephalic.     Right Ear: External ear normal.     Left Ear: External ear normal.     Nose: Nose normal.  Eyes:     General:        Right eye: No discharge.        Left eye: No discharge.     Conjunctiva/sclera: Conjunctivae normal.     Pupils: Pupils are equal, round, and reactive to light.  Neck:     Thyroid: No thyromegaly.     Vascular: No JVD.     Trachea: No tracheal deviation.  Cardiovascular:     Rate and Rhythm: Normal rate and regular rhythm.      Heart sounds: Normal heart sounds.  Pulmonary:     Effort: No respiratory distress.     Breath sounds: No stridor. No wheezing.  Abdominal:     General: Bowel sounds are normal. There is no distension.     Palpations: Abdomen is soft. There is no mass.     Tenderness: There is no abdominal tenderness. There is no guarding or rebound.  Musculoskeletal:        General: No tenderness.     Cervical back: Normal range of motion and neck supple. No rigidity.  Lymphadenopathy:     Cervical: No cervical adenopathy.  Skin:    Findings: No erythema or rash.  Neurological:     Cranial Nerves: No cranial nerve deficit.     Motor: No abnormal muscle tone.     Coordination: Coordination normal.     Deep Tendon Reflexes: Reflexes normal.  Psychiatric:        Behavior: Behavior normal.        Thought Content: Thought content normal.        Judgment: Judgment normal.    Lab Results  Component Value Date   WBC 3.9 (L) 08/22/2020   HGB 13.4 08/22/2020   HCT 39.8 08/22/2020   PLT 290.0 08/22/2020   GLUCOSE 92 08/22/2020   CHOL 208 (H) 08/22/2020   TRIG 51.0 08/22/2020   HDL 86.40 08/22/2020   LDLDIRECT 127.9 03/09/2013   LDLCALC 112 (H) 08/22/2020   ALT 19 08/22/2020   AST 22 08/22/2020   NA 141 08/22/2020   K 3.8 08/22/2020   CL 106 08/22/2020   CREATININE 0.85 08/22/2020   BUN 19 08/22/2020   CO2 29 08/22/2020   TSH 5.41 (H) 08/22/2020   INR 1.1 (H) 05/16/2015   HGBA1C 5.4 02/26/2011    CT CARDIAC SCORING (SELF PAY ONLY)  Addendum Date: 10/10/2020   ADDENDUM REPORT: 10/10/2020 10:17 CLINICAL DATA:  64F for cardiovascular disease risk stratification EXAM: Coronary Calcium Score TECHNIQUE: A gated, non-contrast computed tomography scan of the heart was performed using 70m slice thickness. Axial images were analyzed on a dedicated workstation. Calcium scoring of the coronary arteries was performed using the Agatston method. FINDINGS: Coronary arteries: Normal origins. Coronary  Calcium Score: 0 Percentile: 0 Pericardium: Normal. Ascending Aorta: Normal caliber. Minimal calcification of the aortic root and ascending aorta. [facility]cardiac: [facility] IMPRESSION: Coronary calcium score of 0. This was 0 percentile for age-, race-, and sex-matched controls. Calcification of the aortic root and ascending aorta. RECOMMENDATIONS: Coronary artery calcium (CAC) score is a strong predictor of incident coronary heart disease (CHD) and provides predictive information beyond traditional risk factors. CAC scoring is reasonable to use in the decision  to withhold, postpone, or initiate statin therapy in intermediate-risk or selected borderline-risk asymptomatic adults (age 43-75 years and LDL-C >=70 to <190 mg/dL) who do not have diabetes or established atherosclerotic cardiovascular disease (ASCVD).* In intermediate-risk (10-year ASCVD risk >=7.5% to <20%) adults or selected borderline-risk (10-year ASCVD risk >=5% to <7.5%) adults in whom a CAC score is measured for the purpose of making a treatment decision the following recommendations have been made: If CAC=0, it is reasonable to withhold statin therapy and reassess in 5 to 10 years, as long as higher risk conditions are absent (diabetes mellitus, family history of premature CHD in first degree relatives (males <55 years; females <65 years), cigarette smoking, or LDL >=190 mg/dL). If CAC is 1 to 99, it is reasonable to initiate statin therapy for patients >=58 years of age. If CAC is >=100 or >=75th percentile, it is reasonable to initiate statin therapy at any age. Cardiology referral should be considered for patients with CAC scores >=400 or >=75th percentile. *2018 AHA/ACC/AACVPR/AAPA/ABC/ACPM/ADA/AGS/APhA/ASPC/NLA/PCNA Guideline on the Management of Blood Cholesterol: A Report of the American College of Cardiology/American Heart Association Task Force on Clinical Practice Guidelines. J Am Coll Cardiol.  2019;73(24):3168-3209. Skeet Latch, MD Electronically Signed   By: Skeet Latch   On: 10/10/2020 10:17   Result Date: 10/10/2020 EXAM: OVER-READ INTERPRETATION  CT CHEST The following report is an over-read performed by radiologist Dr. Vinnie Langton of [facility], [place] on 10/10/2020. This over-read does not include interpretation of cardiac or coronary anatomy or pathology. The coronary calcium score interpretation by the cardiologist is attached. COMPARISON:  None. FINDINGS: Aortic atherosclerosis. Within the visualized portions of the thorax there are no suspicious appearing pulmonary nodules or masses, there is no acute consolidative airspace disease, no pleural effusions, no pneumothorax and no lymphadenopathy. Visualized portions of the upper abdomen are unremarkable. There are no aggressive appearing lytic or blastic lesions noted in the visualized portions of the skeleton. IMPRESSION: 1.  Aortic Atherosclerosis (ICD10-I70.0). Electronically Signed: By: Vinnie Langton M.D. On: 10/10/2020 09:53    Assessment & Plan:   There are no diagnoses linked to this encounter.   No orders of the defined types were placed in this encounter.    Follow-up: No follow-ups on file.  Walker Kehr, MD

## 2020-12-19 NOTE — Patient Instructions (Signed)
It is likely a GERD related problem (too much acid). You need to take Protonix (pantoprazole) for it. Do not take large pills for 2-3 weeks

## 2020-12-20 ENCOUNTER — Telehealth: Payer: Self-pay | Admitting: Internal Medicine

## 2020-12-20 NOTE — Telephone Encounter (Signed)
Pt was prescribed PROTONX on 8.10.22 but she is wondering if she is okay to still take her METHOTREXATE along with it.  Please advise

## 2020-12-21 NOTE — Telephone Encounter (Signed)
It is okay.  Thank you

## 2020-12-21 NOTE — Telephone Encounter (Signed)
Called and left a vm with providers recommendations.

## 2020-12-22 ENCOUNTER — Other Ambulatory Visit: Payer: Self-pay | Admitting: Internal Medicine

## 2020-12-22 MED ORDER — FAMOTIDINE 40 MG PO TABS: 40.0000 mg | ORAL_TABLET | Freq: Every day | ORAL | 3 refills | Status: DC

## 2021-01-03 ENCOUNTER — Telehealth: Payer: Self-pay | Admitting: Internal Medicine

## 2021-01-03 NOTE — Telephone Encounter (Signed)
How long to take? Stopped taking over the counter  because of taking this medication, patient needs someone to call her back at her home number.   pantoprazole (PROTONIX) 40 MG tablet [id number]  DISCONTINUED

## 2021-01-04 NOTE — Telephone Encounter (Signed)
Notified pt spoke w/husband gave him MD response.Marland KitchenJohny Serrano

## 2021-01-04 NOTE — Telephone Encounter (Signed)
Please continue to take daily until the next appointment with me.  Thank you

## 2021-01-24 ENCOUNTER — Encounter: Payer: Self-pay | Admitting: Internal Medicine

## 2021-01-24 ENCOUNTER — Other Ambulatory Visit: Payer: Self-pay

## 2021-01-24 ENCOUNTER — Ambulatory Visit: Payer: Medicare PPO | Admitting: Internal Medicine

## 2021-01-24 DIAGNOSIS — E789 Disorder of lipoprotein metabolism, unspecified: Secondary | ICD-10-CM | POA: Diagnosis not present

## 2021-01-24 DIAGNOSIS — K219 Gastro-esophageal reflux disease without esophagitis: Secondary | ICD-10-CM | POA: Diagnosis not present

## 2021-01-24 DIAGNOSIS — R252 Cramp and spasm: Secondary | ICD-10-CM

## 2021-01-24 DIAGNOSIS — R070 Pain in throat: Secondary | ICD-10-CM

## 2021-01-24 DIAGNOSIS — R413 Other amnesia: Secondary | ICD-10-CM | POA: Diagnosis not present

## 2021-01-24 NOTE — Assessment & Plan Note (Signed)
GERD sx's (throat discomfort) - resolved on Pepcid Cont w/Pepcid

## 2021-01-24 NOTE — Progress Notes (Signed)
Subjective:  Patient ID: Wendy Serrano, female    DOB: [birth year]  Age: 75 y.o. MRN: [id number]  CC: Follow-up (Discuss Pantoprazole)   HPI Wendy Serrano presents for GERD sx's (throat discomfort) - resolved on Pepcid F/u memory loss. Not taking Aricept F/u dyslipidemia   Outpatient Medications Prior to Visit  Medication Sig Dispense Refill   aspirin 81 MG chewable tablet Chew 81 mg by mouth daily.     b complex vitamins tablet Take 1 tablet by mouth daily. 100 tablet 3   calcium-vitamin D (OSCAL WITH D) 500-200 MG-UNIT per tablet Take 1 tablet by mouth 2 (two) times daily.     donepezil (ARICEPT) 10 MG tablet Take 1 tablet (10 mg total) by mouth at bedtime. Start taking 1/2 tablet at bedtime for 1 month, then take 1 tablet at bedtime 90 tablet 3   famotidine (PEPCID) 40 MG tablet Take 1 tablet (40 mg total) by mouth daily. 90 tablet 3   glucosamine-chondroitin 500-400 MG tablet Take 1 tablet by mouth 2 (two) times daily.     methotrexate (RHEUMATREX) 2.5 MG tablet   3   Multiple Vitamin (MULTIVITAMIN) tablet Take 1 tablet by mouth daily.     Multiple Vitamins-Minerals (OCUVITE ADULT 50+) CAPS Take 1 capsule by mouth daily.     prednisoLONE acetate (PRED FORTE) 1 % ophthalmic suspension Place 1 drop into both eyes 2 (two) times daily.     SIMBRINZA 1-0.2 % SUSP Place 1 drop into both eyes 3 (three) times daily.  6   folic acid (FOLVITE) 1 MG tablet Take 5 mg by mouth daily.     Garlic TABS Take 2 tablets by mouth 2 (two) times daily. Kyolic     pantoprazole (PROTONIX) 40 MG tablet Take 40 mg by mouth daily.     No facility-administered medications prior to visit.    ROS: Review of Systems  Constitutional:  Negative for activity change, appetite change, chills, fatigue and unexpected weight change.  HENT:  Negative for congestion, dental problem, mouth sores, postnasal drip, sinus pressure, sore throat, trouble swallowing and voice change.   Eyes:  Negative for visual  disturbance.  Respiratory:  Negative for cough and chest tightness.   Cardiovascular:  Negative for chest pain.  Gastrointestinal:  Negative for abdominal pain and nausea.  Genitourinary:  Negative for difficulty urinating, frequency and vaginal pain.  Musculoskeletal:  Negative for back pain and gait problem.  Skin:  Negative for pallor and rash.  Neurological:  Negative for dizziness, tremors, weakness, numbness and headaches.  Psychiatric/Behavioral:  Positive for decreased concentration. Negative for confusion, sleep disturbance and suicidal ideas.    Objective:  BP (!) 148/80 (BP Location: Left Arm)   Pulse 86   Temp 98.4 F (36.9 C) (Oral)   SpO2 99%   BP Readings from Last 3 Encounters:  01/24/21 (!) 148/80  12/19/20 122/68  11/16/20 132/80    Wt Readings from Last 3 Encounters:  11/16/20 132 lb 6.4 oz (60.1 kg)  11/01/20 131 lb (59.4 kg)  08/22/20 132 lb (59.9 kg)    Physical Exam Constitutional:      General: She is not in acute distress.    Appearance: Normal appearance. She is well-developed.  HENT:     Head: Normocephalic.     Right Ear: External ear normal.     Left Ear: External ear normal.     Nose: Nose normal.  Eyes:     General:        Right  eye: No discharge.        Left eye: No discharge.     Conjunctiva/sclera: Conjunctivae normal.     Pupils: Pupils are equal, round, and reactive to light.  Neck:     Thyroid: No thyromegaly.     Vascular: No JVD.     Trachea: No tracheal deviation.  Cardiovascular:     Rate and Rhythm: Normal rate and regular rhythm.     Heart sounds: Normal heart sounds.  Pulmonary:     Effort: No respiratory distress.     Breath sounds: No stridor. No wheezing.  Abdominal:     General: Bowel sounds are normal. There is no distension.     Palpations: Abdomen is soft. There is no mass.     Tenderness: There is no abdominal tenderness. There is no guarding or rebound.  Musculoskeletal:        General: No tenderness.      Cervical back: Normal range of motion and neck supple. No rigidity.  Lymphadenopathy:     Cervical: No cervical adenopathy.  Skin:    Findings: No erythema or rash.  Neurological:     Cranial Nerves: No cranial nerve deficit.     Motor: No abnormal muscle tone.     Coordination: Coordination normal.     Gait: Gait normal.     Deep Tendon Reflexes: Reflexes normal.  Psychiatric:        Behavior: Behavior normal.        Thought Content: Thought content normal.        Judgment: Judgment normal.    Lab Results  Component Value Date   WBC 3.9 (L) 08/22/2020   HGB 13.4 08/22/2020   HCT 39.8 08/22/2020   PLT 290.0 08/22/2020   GLUCOSE 92 08/22/2020   CHOL 208 (H) 08/22/2020   TRIG 51.0 08/22/2020   HDL 86.40 08/22/2020   LDLDIRECT 127.9 03/09/2013   LDLCALC 112 (H) 08/22/2020   ALT 19 08/22/2020   AST 22 08/22/2020   NA 141 08/22/2020   K 3.8 08/22/2020   CL 106 08/22/2020   CREATININE 0.85 08/22/2020   BUN 19 08/22/2020   CO2 29 08/22/2020   TSH 5.41 (H) 08/22/2020   INR 1.1 (H) 05/16/2015   HGBA1C 5.4 02/26/2011    CT CARDIAC SCORING (SELF PAY ONLY)  Addendum Date: 10/10/2020   ADDENDUM REPORT: 10/10/2020 10:17 CLINICAL DATA:  25F for cardiovascular disease risk stratification EXAM: Coronary Calcium Score TECHNIQUE: A gated, non-contrast computed tomography scan of the heart was performed using 28m slice thickness. Axial images were analyzed on a dedicated workstation. Calcium scoring of the coronary arteries was performed using the Agatston method. FINDINGS: Coronary arteries: Normal origins. Coronary Calcium Score: 0 Percentile: 0 Pericardium: Normal. Ascending Aorta: Normal caliber. Minimal calcification of the aortic root and ascending aorta. [facility]cardiac: [facility] IMPRESSION: Coronary calcium score of 0. This was 0 percentile for age-, race-, and sex-matched controls. Calcification of the aortic root and ascending aorta. RECOMMENDATIONS:  Coronary artery calcium (CAC) score is a strong predictor of incident coronary heart disease (CHD) and provides predictive information beyond traditional risk factors. CAC scoring is reasonable to use in the decision to withhold, postpone, or initiate statin therapy in intermediate-risk or selected borderline-risk asymptomatic adults (age 76-75years and LDL-C >=70 to <190 mg/dL) who do not have diabetes or established atherosclerotic cardiovascular disease (ASCVD).* In intermediate-risk (10-year ASCVD risk >=7.5% to <20%) adults or selected borderline-risk (10-year ASCVD risk >=5% to <7.5%)  adults in whom a CAC score is measured for the purpose of making a treatment decision the following recommendations have been made: If CAC=0, it is reasonable to withhold statin therapy and reassess in 5 to 10 years, as long as higher risk conditions are absent (diabetes mellitus, family history of premature CHD in first degree relatives (males <55 years; females <65 years), cigarette smoking, or LDL >=190 mg/dL). If CAC is 1 to 99, it is reasonable to initiate statin therapy for patients >=88 years of age. If CAC is >=100 or >=75th percentile, it is reasonable to initiate statin therapy at any age. Cardiology referral should be considered for patients with CAC scores >=400 or >=75th percentile. *2018 AHA/ACC/AACVPR/AAPA/ABC/ACPM/ADA/AGS/APhA/ASPC/NLA/PCNA Guideline on the Management of Blood Cholesterol: A Report of the American College of Cardiology/American Heart Association Task Force on Clinical Practice Guidelines. J Am Coll Cardiol. 2019;73(24):3168-3209. Skeet Latch, MD Electronically Signed   By: Skeet Latch   On: 10/10/2020 10:17   Result Date: 10/10/2020 EXAM: OVER-READ INTERPRETATION  CT CHEST The following report is an over-read performed by radiologist Dr. Vinnie Langton of [facility], [place] on 10/10/2020. This over-read does not include interpretation of cardiac or coronary anatomy or pathology.  The coronary calcium score interpretation by the cardiologist is attached. COMPARISON:  None. FINDINGS: Aortic atherosclerosis. Within the visualized portions of the thorax there are no suspicious appearing pulmonary nodules or masses, there is no acute consolidative airspace disease, no pleural effusions, no pneumothorax and no lymphadenopathy. Visualized portions of the upper abdomen are unremarkable. There are no aggressive appearing lytic or blastic lesions noted in the visualized portions of the skeleton. IMPRESSION: 1.  Aortic Atherosclerosis (ICD10-I70.0). Electronically Signed: By: Vinnie Langton M.D. On: 10/10/2020 09:53    Assessment & Plan:   Problem List Items Addressed This Visit     Elevated serum cholesterol    6/22 CT Coronary calcium score of 0. OK to d/c Garlik pills      GERD (gastroesophageal reflux disease)    GERD sx's (throat discomfort) - resolved on Pepcid. Continue w/Pepcid      Leg cramps    Take Magnesium gummies      Memory difficulties     Not better. Not taking Aricept. Re-start Aricept      Throat discomfort    GERD sx's (throat discomfort) - resolved on Pepcid Cont w/Pepcid          Follow-up: Return in about 3 months (around 04/25/2021) for a follow-up visit.  Walker Kehr, MD

## 2021-01-24 NOTE — Assessment & Plan Note (Signed)
  Not better. Not taking Aricept. Re-start Aricept

## 2021-01-24 NOTE — Assessment & Plan Note (Signed)
Take Magnesium gummies

## 2021-01-24 NOTE — Assessment & Plan Note (Addendum)
GERD sx's (throat discomfort) - resolved on Pepcid. Continue w/Pepcid

## 2021-01-24 NOTE — Assessment & Plan Note (Signed)
6/22 CT Coronary calcium score of 0. OK to d/c Garlik pills

## 2021-02-26 ENCOUNTER — Ambulatory Visit: Payer: Medicare PPO | Admitting: Internal Medicine

## 2021-02-26 ENCOUNTER — Other Ambulatory Visit: Payer: Self-pay

## 2021-02-26 ENCOUNTER — Encounter: Payer: Self-pay | Admitting: Internal Medicine

## 2021-02-26 VITALS — BP 124/70 | HR 77 | Resp 18 | Ht 68.0 in | Wt 134.6 lb

## 2021-02-26 DIAGNOSIS — H9312 Tinnitus, left ear: Secondary | ICD-10-CM | POA: Diagnosis not present

## 2021-02-26 DIAGNOSIS — S00411A Abrasion of right ear, initial encounter: Secondary | ICD-10-CM | POA: Diagnosis not present

## 2021-02-26 DIAGNOSIS — R413 Other amnesia: Secondary | ICD-10-CM | POA: Diagnosis not present

## 2021-02-26 NOTE — Progress Notes (Signed)
   Subjective:   Patient ID: Wendy Serrano, female    DOB: August 01, 1945, 75 y.o.   MRN: [id number]  HPI The patient is a 75 YO female coming in for new tinnitus.  Review of Systems  Constitutional: Negative.   HENT:  Positive for tinnitus.   Eyes: Negative.   Respiratory:  Negative for cough, chest tightness and shortness of breath.   Cardiovascular:  Negative for chest pain, palpitations and leg swelling.  Gastrointestinal:  Negative for abdominal distention, abdominal pain, constipation, diarrhea, nausea and vomiting.  Musculoskeletal: Negative.   Skin: Negative.   Neurological: Negative.   Psychiatric/Behavioral: Negative.     Objective:  Physical Exam Constitutional:      Appearance: She is well-developed.  HENT:     Head: Normocephalic and atraumatic.  Cardiovascular:     Rate and Rhythm: Normal rate and regular rhythm.  Pulmonary:     Effort: Pulmonary effort is normal. No respiratory distress.     Breath sounds: Normal breath sounds. No wheezing or rales.  Abdominal:     General: Bowel sounds are normal. There is no distension.     Palpations: Abdomen is soft.     Tenderness: There is no abdominal tenderness. There is no rebound.  Musculoskeletal:     Cervical back: Normal range of motion.  Skin:    General: Skin is warm and dry.  Neurological:     Mental Status: She is alert and oriented to person, place, and time.     Coordination: Coordination normal.    Vitals:   02/26/21 1418  BP: 124/70  Pulse: 77  Resp: 18  SpO2: 99%  Weight: 134 lb 9.6 oz (61.1 kg)  Height: 5\' 8"  (1.727 m)    This visit occurred during the SARS-CoV-2 public health emergency.  Safety protocols were in place, including screening questions prior to the visit, additional usage of staff PPE, and extensive cleaning of exam room while observing appropriate contact time as indicated for disinfecting solutions.   Assessment & Plan:  Visit time 25 minutes in face to face communication with  patient and coordination of care, additional 5 minutes spent in record review, coordination or care, ordering tests, communicating/referring to other healthcare professionals, documenting in medical records all on the same day of the visit for total time 30 minutes spent on the visit.

## 2021-02-26 NOTE — Patient Instructions (Addendum)
You can take calcium over the counter at night time or chew 2 tums either way.  It is okay to switch back to the original form of the magnesium.  We will get you in with the ear nose and throat doctor to check both ears.

## 2021-03-01 ENCOUNTER — Encounter: Payer: Self-pay | Admitting: Internal Medicine

## 2021-03-01 DIAGNOSIS — S00411A Abrasion of right ear, initial encounter: Secondary | ICD-10-CM | POA: Insufficient documentation

## 2021-03-01 NOTE — Assessment & Plan Note (Signed)
There is an abrasion or lesion in the right ear canal. Referral to ENT for further assessment. There is no pain associated. She is advised not to use anything in the ear including q tips or other. There is no infection in the TM or ear wax build up to account for new tinnitus.

## 2021-03-01 NOTE — Assessment & Plan Note (Signed)
Referral to ENT for new tinnitus interfering with life. She did have some changes in the right ear canal which will also need evaluation. Advised no object in ear canal including q tips.

## 2021-03-01 NOTE — Assessment & Plan Note (Signed)
She has not started aricept and had questions and concerns about this during visit. Those were answered and talked to her about aricept to help limit acceleration of aging process on brain. She is unsure if she will take this or not.

## 2021-03-04 ENCOUNTER — Encounter: Payer: Self-pay | Admitting: Neurology

## 2021-03-04 ENCOUNTER — Other Ambulatory Visit: Payer: Self-pay

## 2021-03-04 ENCOUNTER — Ambulatory Visit: Payer: Medicare PPO | Admitting: Neurology

## 2021-03-04 VITALS — BP 132/72 | HR 79 | Ht 67.5 in | Wt 134.0 lb

## 2021-03-04 DIAGNOSIS — R413 Other amnesia: Secondary | ICD-10-CM

## 2021-03-04 NOTE — Progress Notes (Signed)
Reason for visit: Memory disorder  Wendy Serrano is an 75 y.o. female  History of present illness:  Wendy Serrano is a 75 year old right-handed black female with a history of a mild memory disturbance.  The patient in the past has had some issues with vertigo that may come and go, she does the Epley maneuvers for this.  She is on methotrexate, but she is not certain why she is on this.  In the last several months she has noted some "frying" noise in the left ear.  She will be seeing ENT in the near future.  She reports no headaches at this point.  She has not altered any activities of daily living because of memory, she still operates a motor vehicle.  She chronically has had some problems with directions, she does not cook.  She is able to keep up with her medications and appointments.  She is on Aricept, she takes a half a tablet in the evening.  She claims that she could not swallow a full tablet and there is some indication that she had some stomach upset on the 10 mg dose.  The patient herself does not recall this.  She returns to this office for further evaluation.  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 She has never used smokeless tobacco. She reports that she does not drink alcohol and does not use drugs.   No Known Allergies  Medications:  Prior to Admission medications   Medication Sig Start Date End Date Taking? Authorizing Provider  aspirin 81 MG chewable tablet Chew 81 mg by mouth daily.   Yes [provider]  b complex vitamins tablet Take 1 tablet by mouth daily. 06/16/19  Yes Plotnikov, Evie Lacks, MD  calcium-vitamin D (OSCAL WITH D) 500-200 MG-UNIT per tablet Take 1 tablet by mouth 2 (two) times daily.   Yes [provider]  donepezil (ARICEPT) 10 MG tablet Take 1 tablet (10 mg total) by mouth at bedtime. Start taking 1/2 tablet at bedtime for 1 month, then take 1 tablet at bedtime 11/16/20  Yes Plotnikov, Evie Lacks, MD  famotidine (PEPCID) 40 MG tablet Take 1 tablet (40 mg total) by mouth daily. 12/22/20  Yes Plotnikov, Evie Lacks, MD  glucosamine-chondroitin 500-400 MG tablet Take 1 tablet by mouth 2 (two) times  daily.    [provider]  methotrexate (RHEUMATREX) 2.5 MG tablet  08/09/15   [provider]  Multiple Vitamin (MULTIVITAMIN) tablet Take 1 tablet by mouth daily.    [provider]  Multiple Vitamins-Minerals (OCUVITE ADULT 50+) CAPS Take 1 capsule by mouth daily.    [provider]  prednisoLONE acetate (PRED FORTE) 1 % ophthalmic suspension Place 1 drop into both eyes 2 (two) times daily.    [provider]  SIMBRINZA 1-0.2 % SUSP Place 1 drop into both eyes 3 (three) times daily. 07/29/15   [provider]    ROS:  Out of a complete 14 system review of symptoms, the patient complains only of the following symptoms, and all other reviewed systems are negative.  Memory problems Tinnitus, left ear  Height 5' 7.5" (1.715 m), weight 134 lb (60.8 kg).  Physical Exam  General: The  patient is alert and cooperative at the time of the examination.  Neck: Neck is supple, no carotid bruits are noted.  Ears: Tympanic membranes are slightly sclerotic, no cerumen plug is noted on either side.  Skin: 1+ edema in the ankles is seen bilaterally.   Neurologic Exam  Mental status: The patient is alert and oriented x 3 at the time of the examination. The patient has apparent normal recent and remote memory, with an apparently normal attention span and concentration ability.   Cranial nerves: Facial symmetry is present. Speech is normal, no aphasia or dysarthria is noted. Extraocular movements are full. Visual fields are full.  Motor: The patient has good strength in all 4 extremities.  Sensory examination: Soft touch sensation is symmetric on the face, arms, and legs.  Coordination: The patient has good finger-nose-finger and heel-to-shin bilaterally.  Some mild apraxia is seen with the use of the upper extremities.  Gait and station: The patient has a normal gait. Tandem gait is normal. Romberg is negative. No drift is seen.  Reflexes: Deep tendon reflexes are symmetric.   MRI brain 05/17/20:   IMPRESSION: This MRI of the brain without contrast shows the following: 1.   Mild generalized cortical atrophy, probably normal for age and unchanged compared to the 2019 MRI. 2.   Scattered T2/FLAIR hyperintense single and confluent foci predominantly in the hemispheres and a few foci in the pons and cerebellum.  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  None of the foci appear to be acute and there is no progression compared to the 2019 MRI. 3.   No acute findings.  * MRI scan images were reviewed online. I agree with the written report.    Assessment/Plan:  1.  Mild memory disturbance  The patient will try taking 2 half tablets of the Aricept daily.  If she is not able to tolerate this, she will cut back to taking 5 mg daily, she will  call for a 5 mg tablet prescription.  She will follow-up here in 4 months with Dr. Brett Serrano.  Wendy Alexanders MD 03/04/2021 7:58 AM  [facility] [address] [place], [place] [address]  Phone [phone] Fax [phone]

## 2021-03-08 ENCOUNTER — Telehealth: Payer: Self-pay | Admitting: Lab

## 2021-03-08 NOTE — Chronic Care Management (AMB) (Signed)
  Chronic Care Management   Note  03/08/2021 Name: Wendy Serrano MRN: [id number] DOB: 1945/11/01  Wendy Serrano is a 75 y.o. year old female who is a primary care patient of Plotnikov, Evie Lacks, MD. I reached out to Sullivan Lone by phone today in response to a referral sent by Ms. Lucilla Edin PCP, Plotnikov, Evie Lacks, MD.   [name] was given information about Chronic Care Management services today including:  CCM service includes personalized support from designated clinical staff supervised by her physician, including individualized plan of care and coordination with other care providers 24/7 contact phone numbers for assistance for urgent and routine care needs. Service will only be billed when office clinical staff spend 20 minutes or more in a month to coordinate care. Only one practitioner may furnish and bill the service in a calendar month. The patient may stop CCM services at any time (effective at the end of the month) by phone call to the office staff.   Patient agreed to services and verbal consent obtained.   Follow up plan: [place]

## 2021-03-12 ENCOUNTER — Encounter: Payer: Self-pay | Admitting: Internal Medicine

## 2021-03-12 ENCOUNTER — Ambulatory Visit: Payer: Medicare PPO | Admitting: Internal Medicine

## 2021-03-12 ENCOUNTER — Other Ambulatory Visit: Payer: Self-pay

## 2021-03-12 DIAGNOSIS — R519 Headache, unspecified: Secondary | ICD-10-CM | POA: Insufficient documentation

## 2021-03-12 DIAGNOSIS — R413 Other amnesia: Secondary | ICD-10-CM | POA: Diagnosis not present

## 2021-03-12 DIAGNOSIS — H9312 Tinnitus, left ear: Secondary | ICD-10-CM | POA: Diagnosis not present

## 2021-03-12 DIAGNOSIS — G4489 Other headache syndrome: Secondary | ICD-10-CM

## 2021-03-12 MED ORDER — VALACYCLOVIR HCL 1 G PO TABS
1000.0000 mg | ORAL_TABLET | Freq: Three times a day (TID) | ORAL | 2 refills | Status: DC
Start: 2021-03-12 — End: 2021-03-28

## 2021-03-12 MED ORDER — DONEPEZIL HCL 10 MG PO TABS: 10.0000 mg | ORAL_TABLET | Freq: Every day | ORAL | 3 refills | Status: DC

## 2021-03-12 NOTE — Progress Notes (Signed)
Subjective:  Patient ID: Wendy Serrano, female    DOB: 02-Oct-1945  Age: 75 y.o. MRN: [id number]  CC: Tinnitus   HPI Rexine Gowens presents for leg cramps - better, memory loss, dizziness C/o "frying sounds" in the L ear x 2 weeks - better. C/o sensation on the L head - hypersensitive to touch.  ENT appt is pending   Outpatient Medications Prior to Visit  Medication Sig Dispense Refill   aspirin 81 MG chewable tablet Chew 81 mg by mouth daily.     b complex vitamins tablet Take 1 tablet by mouth daily. 100 tablet 3   calcium-vitamin D (OSCAL WITH D) 500-200 MG-UNIT per tablet Take 1 tablet by mouth 2 (two) times daily.     famotidine (PEPCID) 40 MG tablet Take 1 tablet (40 mg total) by mouth daily. 90 tablet 3   glucosamine-chondroitin 500-400 MG tablet Take 1 tablet by mouth 2 (two) times daily.     methotrexate (RHEUMATREX) 2.5 MG tablet   3   Multiple Vitamin (MULTIVITAMIN) tablet Take 1 tablet by mouth daily.     Multiple Vitamins-Minerals (OCUVITE ADULT 50+) CAPS Take 1 capsule by mouth daily.     prednisoLONE acetate (PRED FORTE) 1 % ophthalmic suspension Place 1 drop into both eyes 2 (two) times daily.     SIMBRINZA 1-0.2 % SUSP Place 1 drop into both eyes 3 (three) times daily.  6   donepezil (ARICEPT) 10 MG tablet Take 1 tablet (10 mg total) by mouth at bedtime. Start taking 1/2 tablet at bedtime for 1 month, then take 1 tablet at bedtime 90 tablet 3   No facility-administered medications prior to visit.    ROS: Review of Systems  Constitutional:  Negative for activity change, appetite change, chills, fatigue and unexpected weight change.  HENT:  Positive for tinnitus. Negative for congestion, mouth sores and sinus pressure.   Eyes:  Negative for visual disturbance.  Respiratory:  Negative for cough and chest tightness.   Gastrointestinal:  Negative for abdominal pain and nausea.  Genitourinary:  Negative for difficulty urinating, frequency and vaginal pain.   Musculoskeletal:  Negative for back pain and gait problem.  Skin:  Negative for pallor and rash.  Neurological:  Positive for headaches. Negative for dizziness, tremors, weakness and numbness.  Psychiatric/Behavioral:  Positive for decreased concentration and sleep disturbance. Negative for confusion and suicidal ideas. The patient is not nervous/anxious.    Objective:  BP (!) 150/92 (BP Location: Left Arm)   Pulse 79   Temp 98.5 F (36.9 C) (Oral)   SpO2 99%   BP Readings from Last 3 Encounters:  03/12/21 (!) 150/92  03/04/21 132/72  02/26/21 124/70    Wt Readings from Last 3 Encounters:  03/04/21 134 lb (60.8 kg)  02/26/21 134 lb 9.6 oz (61.1 kg)  11/16/20 132 lb 6.4 oz (60.1 kg)    Physical Exam Constitutional:      General: She is not in acute distress.    Appearance: She is well-developed.  HENT:     Head: Normocephalic.     Right Ear: External ear normal.     Left Ear: External ear normal.     Nose: Nose normal.  Eyes:     General:        Right eye: No discharge.        Left eye: No discharge.     Conjunctiva/sclera: Conjunctivae normal.     Pupils: Pupils are equal, round, and reactive to light.  Neck:  Thyroid: No thyromegaly.     Vascular: No JVD.     Trachea: No tracheal deviation.  Cardiovascular:     Rate and Rhythm: Normal rate and regular rhythm.     Heart sounds: Normal heart sounds.  Pulmonary:     Effort: No respiratory distress.     Breath sounds: No stridor. No wheezing.  Abdominal:     General: Bowel sounds are normal. There is no distension.     Palpations: Abdomen is soft. There is no mass.     Tenderness: There is no abdominal tenderness. There is no guarding or rebound.  Musculoskeletal:        General: No tenderness.     Cervical back: Normal range of motion and neck supple. No rigidity.  Lymphadenopathy:     Cervical: No cervical adenopathy.  Skin:    Findings: No erythema or rash.  Neurological:     Cranial Nerves: No  cranial nerve deficit.     Motor: No abnormal muscle tone.     Coordination: Coordination normal.     Deep Tendon Reflexes: Reflexes normal.  Psychiatric:        Behavior: Behavior normal.        Thought Content: Thought content normal.        Judgment: Judgment normal.   Alert, cooperative 64mm crust at 6 o'clock in the L ear canal  Lab Results  Component Value Date   WBC 3.9 (L) 08/22/2020   HGB 13.4 08/22/2020   HCT 39.8 08/22/2020   PLT 290.0 08/22/2020   GLUCOSE 92 08/22/2020   CHOL 208 (H) 08/22/2020   TRIG 51.0 08/22/2020   HDL 86.40 08/22/2020   LDLDIRECT 127.9 03/09/2013   LDLCALC 112 (H) 08/22/2020   ALT 19 08/22/2020   AST 22 08/22/2020   NA 141 08/22/2020   K 3.8 08/22/2020   CL 106 08/22/2020   CREATININE 0.85 08/22/2020   BUN 19 08/22/2020   CO2 29 08/22/2020   TSH 5.41 (H) 08/22/2020   INR 1.1 (H) 05/16/2015   HGBA1C 5.4 02/26/2011    CT CARDIAC SCORING (SELF PAY ONLY)  Addendum Date: 10/10/2020   ADDENDUM REPORT: 10/10/2020 10:17 CLINICAL DATA:  88F for cardiovascular disease risk stratification EXAM: Coronary Calcium Score TECHNIQUE: A gated, non-contrast computed tomography scan of the heart was performed using 46mm slice thickness. Axial images were analyzed on a dedicated workstation. Calcium scoring of the coronary arteries was performed using the Agatston method. FINDINGS: Coronary arteries: Normal origins. Coronary Calcium Score: 0 Percentile: 0 Pericardium: Normal. Ascending Aorta: Normal caliber. Minimal calcification of the aortic root and ascending aorta. Non-cardiac: See separate report from [facility]. IMPRESSION: Coronary calcium score of 0. This was 0 percentile for age-, race-, and sex-matched controls. Calcification of the aortic root and ascending aorta. RECOMMENDATIONS: Coronary artery calcium (CAC) score is a strong predictor of incident coronary heart disease (CHD) and provides predictive information beyond traditional risk factors.  CAC scoring is reasonable to use in the decision to withhold, postpone, or initiate statin therapy in intermediate-risk or selected borderline-risk asymptomatic adults (age 22-75 years and LDL-C >=70 to <190 mg/dL) who do not have diabetes or established atherosclerotic cardiovascular disease (ASCVD).* In intermediate-risk (10-year ASCVD risk >=7.5% to <20%) adults or selected borderline-risk (10-year ASCVD risk >=5% to <7.5%) adults in whom a CAC score is measured for the purpose of making a treatment decision the following recommendations have been made: If CAC=0, it is reasonable to withhold statin therapy and reassess in 5  to 10 years, as long as higher risk conditions are absent (diabetes mellitus, family history of premature CHD in first degree relatives (males <55 years; females <65 years), cigarette smoking, or LDL >=190 mg/dL). If CAC is 1 to 99, it is reasonable to initiate statin therapy for patients >=24 years of age. If CAC is >=100 or >=75th percentile, it is reasonable to initiate statin therapy at any age. Cardiology referral should be considered for patients with CAC scores >=400 or >=75th percentile. *2018 AHA/ACC/AACVPR/AAPA/ABC/ACPM/ADA/AGS/APhA/ASPC/NLA/PCNA Guideline on the Management of Blood Cholesterol: A Report of the American College of Cardiology/American Heart Association Task Force on Clinical Practice Guidelines. J Am Coll Cardiol. 2019;73(24):3168-3209. Skeet Latch, MD Electronically Signed   By: Skeet Latch   On: 10/10/2020 10:17   Result Date: 10/10/2020 EXAM: OVER-READ INTERPRETATION  CT CHEST The following report is an over-read performed by radiologist Dr. Vinnie Langton of [facility], [place] on 10/10/2020. This over-read does not include interpretation of cardiac or coronary anatomy or pathology. The coronary calcium score interpretation by the cardiologist is attached. COMPARISON:  None. FINDINGS: Aortic atherosclerosis. Within the visualized portions of the  thorax there are no suspicious appearing pulmonary nodules or masses, there is no acute consolidative airspace disease, no pleural effusions, no pneumothorax and no lymphadenopathy. Visualized portions of the upper abdomen are unremarkable. There are no aggressive appearing lytic or blastic lesions noted in the visualized portions of the skeleton. IMPRESSION: 1.  Aortic Atherosclerosis (ICD10-I70.0). Electronically Signed: By: Vinnie Langton M.D. On: 10/10/2020 09:53    Assessment & Plan:   Problem List Items Addressed This Visit     Headache    Poss herpes zoster Vatrex prn F/u w/Neurology      Memory difficulties    Discussed -  continue with Aricept.  She is currently taking 5 mg at night.  She is not willing to increase the dose at present.  Dr. Jannifer Franklin has retired.  The patient will establish with Dr. Brett Fairy.      Tinnitus of left ear    Probable H zoster - 5mm crust at 6 o'clock in the L ear canal, hypersensitive skin, ear sounds  Empiric Valtrex x 7 d          Meds ordered this encounter  Medications   valACYclovir (VALTREX) 1000 MG tablet    Sig: Take 1 tablet (1,000 mg total) by mouth 3 (three) times daily.    Dispense:  21 tablet    Refill:  2   donepezil (ARICEPT) 10 MG tablet    Sig: Take 1 tablet (10 mg total) by mouth at bedtime. 1 tablet at bedtime    Dispense:  90 tablet    Refill:  3      Follow-up: No follow-ups on file.  Walker Kehr, MD

## 2021-03-12 NOTE — Assessment & Plan Note (Addendum)
Poss herpes zoster Vatrex prn F/u w/Neurology

## 2021-03-12 NOTE — Assessment & Plan Note (Signed)
Probable H zoster - 11mm crust at 6 o'clock in the L ear canal, hypersensitive skin, ear sounds  Empiric Valtrex x 7 d

## 2021-03-13 NOTE — Assessment & Plan Note (Signed)
Discussed -  continue with Aricept.  She is currently taking 5 mg at night.  She is not willing to increase the dose at present.  Dr. Jannifer Franklin has retired.  The patient will establish with Dr. Brett Fairy.

## 2021-03-18 ENCOUNTER — Telehealth: Payer: Self-pay | Admitting: Neurology

## 2021-03-18 NOTE — Telephone Encounter (Signed)
Pt called, would like to discuss a frying sound on the left side of my head. Have been hearing it for 2 weeks. Would like a call from the nurse.

## 2021-03-18 NOTE — Telephone Encounter (Signed)
Contacted pt, she stated she has been having some discomfort on L side of head, also having touch sensitivity and wants to see Dr Melvern Sample. Please make appt

## 2021-03-22 ENCOUNTER — Telehealth: Payer: Self-pay | Admitting: Pharmacist

## 2021-03-22 NOTE — Progress Notes (Signed)
Chronic Care Management Pharmacy Assistant   Name: Wendy Serrano  MRN: [id number] DOB: [birth year]  Wendy Serrano is an 75 y.o. year old female who presents for his initial CCM visit with the clinical pharmacist.  Reason for Encounter: Initial Visit    Recent office visits:  03/12/21 Plotnikov, Evie Lacks, MD-PCP (Tinnitus of left ear) med changes: valacyclovir 1000 mg and donepezil 10 mg  02/26/21 Hoyt Koch, MD-Internal Medicine (Ear canal abrasion, right, initial encounter) Referral to ENT, no med changes  01/24/21 Plotnikov, Evie Lacks, MD-PCP (Throat discomfort) No med changes  12/19/20 Plotnikov, Evie Lacks, MD-PCP (Throat discomfort) Med changes: pantoprazole  11/16/20 Plotnikov, Evie Lacks, MD-PCP (Memory difficulties)Med changes:  Increase Aricept to 10 mg at night  Recent consult visits:  03/04/21 Kathrynn Ducking, MD-Neurology (Memory difficulties) No orders or med changes  11/01/20 Suzzanne Cloud, NP (Mild cognitive disorder) med changes:Start taking Aricept 5 mg at bedtime x 1 month, then increase to 10 mg at bedtime   Hospital visits:  None in previous 6 months  Medications: Outpatient Encounter Medications as of 03/22/2021  Medication Sig Note   aspirin 81 MG chewable tablet Chew 81 mg by mouth daily.    b complex vitamins tablet Take 1 tablet by mouth daily.    calcium-vitamin D (OSCAL WITH D) 500-200 MG-UNIT per tablet Take 1 tablet by mouth 2 (two) times daily.    donepezil (ARICEPT) 10 MG tablet Take 1 tablet (10 mg total) by mouth at bedtime. 1 tablet at bedtime    famotidine (PEPCID) 40 MG tablet Take 1 tablet (40 mg total) by mouth daily.    glucosamine-chondroitin 500-400 MG tablet Take 1 tablet by mouth 2 (two) times daily.    methotrexate (RHEUMATREX) 2.5 MG tablet  08/23/2015: Received from: External Pharmacy   Multiple Vitamin (MULTIVITAMIN) tablet Take 1 tablet by mouth daily.    Multiple Vitamins-Minerals (OCUVITE ADULT 50+) CAPS Take 1  capsule by mouth daily.    prednisoLONE acetate (PRED FORTE) 1 % ophthalmic suspension Place 1 drop into both eyes 2 (two) times daily.    SIMBRINZA 1-0.2 % SUSP Place 1 drop into both eyes 3 (three) times daily. 08/23/2015: Received from: External Pharmacy Received Sig: PLACE 1 DROP INTO BOTH EYES TWICE DAILY   valACYclovir (VALTREX) 1000 MG tablet Take 1 tablet (1,000 mg total) by mouth 3 (three) times daily.    No facility-administered encounter medications on file as of 03/22/2021.   Have you seen any other providers since your last visit? Patient states that she has not seen any other providers  Any changes in your medications or health? Patient states no changes in health or medications  Any side effects from any medications? Patient states no side effects from medications  Do you have an symptoms or problems not managed by your medications? Patient states that she does not have any symptoms or problems no managed by medication  Any concerns about your health right now? Patient states that she is having issues with her ears but it is being taken care of by another physican  Has your provider asked that you check blood pressure, blood sugar, or follow special diet at home? no  Do you get any type of exercise on a regular basis? Patient states that she and her husband work out 3 times a week  Can you think of a goal you would like to reach for your health? Patient states to maintain her health  Do you have any problems  getting your medications? no  Is there anything that you would like to discuss during the appointment? Patient states she will discuss at appointment  Please bring medications and supplements to appointment    Star Rating Drugs: None ID  [facility] Pharmacist Assistant [phone]

## 2021-03-25 ENCOUNTER — Ambulatory Visit: Payer: Medicare PPO

## 2021-03-25 ENCOUNTER — Other Ambulatory Visit: Payer: Self-pay

## 2021-03-25 ENCOUNTER — Ambulatory Visit (INDEPENDENT_AMBULATORY_CARE_PROVIDER_SITE_OTHER): Payer: Medicare PPO | Admitting: Pharmacist

## 2021-03-25 DIAGNOSIS — H209 Unspecified iridocyclitis: Secondary | ICD-10-CM

## 2021-03-25 DIAGNOSIS — K219 Gastro-esophageal reflux disease without esophagitis: Secondary | ICD-10-CM

## 2021-03-25 DIAGNOSIS — E785 Hyperlipidemia, unspecified: Secondary | ICD-10-CM

## 2021-03-25 DIAGNOSIS — M810 Age-related osteoporosis without current pathological fracture: Secondary | ICD-10-CM

## 2021-03-25 DIAGNOSIS — R413 Other amnesia: Secondary | ICD-10-CM

## 2021-03-25 NOTE — Progress Notes (Addendum)
Chronic Care Management Pharmacy Note  04/01/2021 Name:  Wendy Serrano MRN:  [id number] DOB:  1945-09-01  Summary: -Pt is doing well overall; she wants to know if she should continue donepezil at 10 mg dose, she is currently tolerating it well -Pt is a candidate for osteoporosis pharmalogic treatment given results of most recent DEXA scan; she is open to starting Prolia injections if insurance covers it -Pt is no longer taking a folic acid supplement despite weekly methotrexate -Pt complains of frequent muscle cramps; she does work out 3x a week at fitness center and reports she does not drink enough fluids  Recommendations/Changes made from today's visit: -Advised pt to continue donepezil 10 mg -Recommend Prolia 60 mg injection q6 months - pending insurance benefit investigation (consulting with PCP) -Recommend to restart folic acid 1 mg daily -Advised to increase fluid intake to 64 oz daily   Subjective: Wendy Serrano is an 75 y.o. year old female who is a primary patient of Plotnikov, Evie Lacks, MD.  The CCM team was consulted for assistance with disease management and care coordination needs.    Engaged with patient face to face for initial visit in response to provider referral for pharmacy case management and/or care coordination services.   Consent to Services:  The patient was given the following information about Chronic Care Management services today, agreed to services, and gave verbal consent: 1. CCM service includes personalized support from designated clinical staff supervised by the primary care provider, including individualized plan of care and coordination with other care providers 2. 24/7 contact phone numbers for assistance for urgent and routine care needs. 3. Service will only be billed when office clinical staff spend 20 minutes or more in a month to coordinate care. 4. Only one practitioner may furnish and bill the service in a calendar month. 5.The patient may  stop CCM services at any time (effective at the end of the month) by phone call to the office staff. 6. The patient will be responsible for cost sharing (co-pay) of up to 20% of the service fee (after annual deductible is met). Patient agreed to services and consent obtained.  Patient Care Team: Plotnikov, Evie Lacks, MD as PCP - General (Internal Medicine) Leo Grosser, Seymour Bars, MD (Inactive) (Obstetrics and Gynecology) Marybelle Killings, MD (Orthopedic Surgery) Ara Kussmaul, MD (Ophthalmology) Paralee Cancel, MD as Consulting Physician (Orthopedic Surgery) Feliz Beam, MD as Referring Physician (Ophthalmology) Carmelina Dane, MD as Referring Physician (Ophthalmology) Servando Salina, MD as Consulting Physician (Obstetrics and Gynecology) Melida Quitter, MD as Consulting Physician (Otolaryngology) Kathrynn Ducking, MD as Consulting Physician (Neurology) Charlton Haws, [facility] as Pharmacist (Pharmacist) Tomasa Blase, [facility] as Pharmacist (Pharmacist)  Recent office visits: 03/12/21 Plotnikov, Evie Lacks, MD-PCP: c/o "frying sound"; possible herpes zoster; rx'd Valtrex 1000 mg TIDx7d; advised f/u with neurology; ENT pending.    02/26/21 Hoyt Koch, MD-Internal Medicine (Ear canal abrasion) Referral to ENT, no med changes  01/24/21 Plotnikov, Evie Lacks, MD-PCP f/u visit: 6/22 CT Coronary Ca 0, ok to d/c Garlique pills. Take Mag gummies for leg cramps. Continue Pepcid. Restart Donepezil. F/U 3 months.   12/19/20 Plotnikov, Evie Lacks, MD-PCP (Throat discomfort) - Rx Pantoprazole. Later d/c'd due to Ddx with MTX and started famotidine.   11/16/20 Plotnikov, Evie Lacks, MD-PCP (Memory difficulties)Med changes: Increase Aricept to 10 mg at night   Recent consult visits: 03/04/21 Kathrynn Ducking, MD-Neurology: f/u memory difficulties. Try 2 half tablets of donepezil daily, if intolerable call back for  5 mg dose. F/U 4 months.   11/01/20 Suzzanne Cloud, NP (Mild cognitive disorder) med  changes:Start taking Aricept 5 mg at bedtime x 1 month, then increase to 10 mg at bedtime   Hospital visits: None in previous 6 months   Objective:  Lab Results  Component Value Date   CREATININE 0.85 08/22/2020   BUN 19 08/22/2020   GFR 66.75 08/22/2020   GFRNONAA 111.51 02/19/2010   GFRAA 81 02/02/2008   NA 141 08/22/2020   K 3.8 08/22/2020   CALCIUM 10.0 08/22/2020   CO2 29 08/22/2020   GLUCOSE 92 08/22/2020    Lab Results  Component Value Date/Time   HGBA1C 5.4 02/26/2011 10:50 AM   HGBA1C 5.6 02/19/2010 11:00 AM   GFR 66.75 08/22/2020 11:08 AM   GFR 83.45 06/16/2019 11:25 AM    Last diabetic Eye exam: No results found for: HMDIABEYEEXA  Last diabetic Foot exam: No results found for: HMDIABFOOTEX   Lab Results  Component Value Date   CHOL 208 (H) 08/22/2020   HDL 86.40 08/22/2020   LDLCALC 112 (H) 08/22/2020   LDLDIRECT 127.9 03/09/2013   TRIG 51.0 08/22/2020   CHOLHDL 2 08/22/2020    Hepatic Function Latest Ref Rng & Units 08/22/2020 06/16/2019 06/14/2018  Total Protein 6.0 - 8.3 g/dL 7.5 7.5 6.9  Albumin 3.5 - 5.2 g/dL 4.3 4.6 4.4  AST 0 - 37 U/L _0 ALT 0 - 35 U/L _1 Alk Phosphatase 39 - 117 U/L 69 62 56  Total Bilirubin 0.2 - 1.2 mg/dL 0.6 0.7 0.6  Bilirubin, Direct 0.0 - 0.3 mg/dL - 0.1 0.1    Lab Results  Component Value Date/Time   TSH 5.41 (H) 08/22/2020 11:08 AM   TSH 6.85 (H) 06/16/2019 11:25 AM   FREET4 0.78 08/23/2020 04:27 PM   FREET4 0.64 06/17/2019 03:44 PM    CBC Latest Ref Rng & Units 08/22/2020 06/16/2019 06/14/2018  WBC 4.0 - 10.5 K/uL 3.9(L) 4.6 3.9(L)  Hemoglobin 12.0 - 15.0 g/dL 13.4 13.1 12.8  Hematocrit 36.0 - 46.0 % 39.8 39.3 38.2  Platelets 150.0 - 400.0 K/uL 290.0 272.0 255.0    Lab Results  Component Value Date/Time   VD25OH 37.45 06/16/2019 11:25 AM   VD25OH 55 03/18/2010 08:24 PM    Clinical ASCVD: Yes  The 10-year ASCVD risk score (Arnett DK, et al., 2019) is: 21.9%   Values used to calculate the score:      Age: 55 years     Sex: Female     Is Non-Hispanic African American: Yes     Diabetic: No     Tobacco smoker: No     Systolic Blood Pressure: 371 mmHg     Is BP treated: No     HDL Cholesterol: 86.4 mg/dL     Total Cholesterol: 208 mg/dL    Depression screen [facility] 2/9 03/29/2021 03/29/2021 12/19/2020  Decreased Interest 0 0 0  Down, Depressed, Hopeless 0 0 0  PHQ - 2 Score 0 0 0  Altered sleeping - - 0  Tired, decreased energy - - 0  Change in appetite - - 0  Feeling bad or failure about yourself  - - 0  Trouble concentrating - - 0  Moving slowly or fidgety/restless - - 0  Suicidal thoughts - - 0  PHQ-9 Score - - 0     Social History   Tobacco Use  Smoking Status Never  Smokeless Tobacco Never   BP Readings from  Last 3 Encounters:  03/28/21 (!) 143/79  03/28/21 123/88  03/12/21 (!) 150/92   Pulse Readings from Last 3 Encounters:  03/28/21 72  03/28/21 60  03/12/21 79   Wt Readings from Last 3 Encounters:  03/28/21 133 lb 8 oz (60.6 kg)  03/04/21 134 lb (60.8 kg)  02/26/21 134 lb 9.6 oz (61.1 kg)   BMI Readings from Last 3 Encounters:  03/28/21 20.60 kg/m  03/04/21 20.68 kg/m  02/26/21 20.47 kg/m    Assessment/Interventions: Review of patient past medical history, allergies, medications, health status, including review of consultants reports, laboratory and other test data, was performed as part of comprehensive evaluation and provision of chronic care management services.   SDOH:  (Social Determinants of Health) assessments and interventions performed: Yes  SDOH Screenings   Alcohol Screen: Low Risk    Last Alcohol Screening Score (AUDIT): 0  Depression (PHQ2-9): Low Risk    PHQ-2 Score: 0  Financial Resource Strain: Low Risk    Difficulty of Paying Living Expenses: Not hard at all  Food Insecurity: No Food Insecurity   Worried About Charity fundraiser in the Last Year: Never true   Ran Out of Food in the Last Year: Never true  Housing: Low Risk     Last Housing Risk Score: 0  Physical Activity: Insufficiently Active   Days of Exercise per Week: 3 days   Minutes of Exercise per Session: 30 min  Social Connections: Engineer, building services of Communication with Friends and Family: Twice a week   Frequency of Social Gatherings with Friends and Family: Twice a week   Attends Religious Services: More than 4 times per year   Active Member of [employer] or Organizations: Yes   Attends Archivist Meetings: 1 to 4 times per year   Marital Status: Married  Stress: No Stress Concern Present   Feeling of Stress : Not at all  Tobacco Use: Low Risk    Smoking Tobacco Use: Never   Smokeless Tobacco Use: Never   Passive Exposure: Not on file  Transportation Needs: No Transportation Needs   Lack of Transportation (Medical): No   Lack of Transportation (Non-Medical): No    CCM Care Plan  No Known Allergies  Medications Reviewed Today     Reviewed by Randel Pigg, LPN (Licensed Practical Nurse) on 03/29/21 at 1441  Med List Status: <None>   Medication Order Taking? Sig Documenting Provider Last Dose Status Informant  aspirin 81 MG chewable tablet [id number] Yes Chew 81 mg by mouth daily. [provider] Taking Active   b complex vitamins tablet [id number] Yes Take 1 tablet by mouth daily. Plotnikov, Evie Lacks, MD Taking Active   calcium-vitamin D (OSCAL WITH D) 500-200 MG-UNIT per tablet [id number] Yes Take 1 tablet by mouth 2 (two) times daily. [provider] Taking Active   donepezil (ARICEPT) 10 MG tablet [id number] Yes Take 1 tablet (10 mg total) by mouth at bedtime. 1 tablet at bedtime Plotnikov, Evie Lacks, MD Taking Active   FOLIC ACID PO [id number] Yes Take 4 tablets by mouth daily. [provider] Taking Active   gabapentin (NEURONTIN) 100 MG capsule [id number] No Take 1 capsule (100 mg total) by mouth at bedtime.  Patient not taking: Reported on 03/29/2021   Dohmeier, Asencion Partridge, MD Not Taking Active    glucosamine-chondroitin 500-400 MG tablet [id number] Yes Take 1 tablet by mouth 2 (two) times daily. [provider] Taking Active   methotrexate (RHEUMATREX) 2.5 MG tablet [id number]  Yes  [provider] Taking Active            Med Note Madilyn Hook Aug 23, 2015 10:23 AM) Received from: External Pharmacy  Multiple Vitamin (MULTIVITAMIN) tablet [id number] Yes Take 1 tablet by mouth daily. [provider] Taking Active   Multiple Vitamins-Minerals (OCUVITE ADULT 50+) CAPS [id number] Yes Take 1 capsule by mouth daily. [provider] Taking Active   prednisoLONE acetate (PRED FORTE) 1 % ophthalmic suspension [id number] Yes Place 1 drop into both eyes 2 (two) times daily. [provider] Taking Active   SIMBRINZA 1-0.2 % SUSP [id number] Yes Place 1 drop into both eyes 3 (three) times daily. [provider] Taking Active            Med Note Madilyn Hook Aug 23, 2015 10:23 AM) Received from: External Pharmacy Received Sig: PLACE 1 DROP INTO BOTH EYES TWICE DAILY            Patient Active Problem List   Diagnosis Date Noted   Neuralgia involving scalp 03/28/2021   Headache 03/12/2021   Ear canal abrasion, right, initial encounter 03/01/2021   GERD (gastroesophageal reflux disease) 01/24/2021   Leg cramps 01/24/2021   Throat discomfort 12/19/2020   Atherosclerosis of aorta (HCC) 10/11/2020   Foot pain, bilateral 05/14/2020   Sleep pattern disturbance 05/11/2020   Neck pain 05/11/2020   Cervicogenic headache 09/21/2019   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 difficile diarrhea 06/03/2017   Infected sebaceous cyst of skin 04/25/2017   Neck mass 04/25/2017   Uveitis 06/06/2016   Benign paroxysmal positional  vertigo 08/23/2015   Knee pain, bilateral 08/23/2015   Tinnitus of left ear 05/24/2013   Elevated serum cholesterol 12/25/2011   Well adult exam 03/01/2011   Osteoporosis 02/19/2010   UNSPECIFIED PERIPHERAL VASCULAR DISEASE 11/29/2009   ALLERGIC RHINITIS 05/12/2007   HERPES SIMPLEX, UNCOMPLICATED 16/38/4665    Immunization History  Administered Date(s) Administered   Fluad Quad(high Dose 65+) 01/12/2019   Influenza Split 02/26/2011, 03/01/2012   Influenza Whole 02/10/2008, 02/15/2009, 02/19/2010   Influenza, High Dose Seasonal PF 03/09/2013, 03/21/2015, 04/01/2016, 03/03/2017, 01/23/2021   Influenza,inj,Quad PF,6+ Mos 05/10/2014, 02/13/2018   PFIZER(Purple Top)SARS-COV-2 Vaccination 05/25/2019, 06/15/2019, 02/04/2020, 08/11/2020   Pfizer Covid-19 Vaccine Bivalent Booster 82yr & up 01/23/2021   Pneumococcal Conjugate-13 03/23/2013   Pneumococcal Polysaccharide-23 02/19/2010, 05/18/2013   Td 02/10/2008   Tdap 06/14/2018   Zoster, Live 07/04/2009    Conditions to be addressed/monitored:  Hyperlipidemia, GERD, and Osteoporosis, Uveitis, Memory difficulties  Care Plan : CDougherty Updates made by FCharlton Haws RNeogasince 04/01/2021 12:00 AM     Problem: Hyperlipidemia, GERD, and Osteoporosis, Uveitis, Memory difficulties   Priority: High     Long-Range Goal: Disease management   Start Date: 04/01/2021  Expected End Date: 04/01/2022  This Visit's Progress: On track  Priority: High  Note:   Current Barriers:  Unable to independently monitor therapeutic efficacy Suboptimal therapeutic regimen for Osteoporosis  Pharmacist Clinical Goal(s):  Patient will achieve adherence to monitoring guidelines and medication adherence to achieve therapeutic efficacy adhere to plan to optimize therapeutic regimen for osteoporosis as evidenced by report of adherence to recommended medication management changes through collaboration with PharmD  and provider.    Interventions: 1:1 collaboration with Plotnikov, Evie Lacks, MD regarding development and update of comprehensive plan of care as evidenced by provider attestation and co-signature Inter-disciplinary care team collaboration (see longitudinal plan of care) Comprehensive medication review performed; medication list updated in electronic medical record  Hyperlipidemia: (LDL goal < 130) -Controlled -Coronary CT 10/2020: Calcium score 0, no evidence of CAD; statin therapy is not indicated -Current treatment: Aspirin 81 mg daily -Current exercise habits: pt works out at fitness center 3x a week -Educated on Cholesterol goals; Importance of limiting foods high in cholesterol; Exercise goal of 150 minutes per week; -Counseled on diet and exercise extensively  Osteoporosis (Goal prevent fractures) -Uncontrolled - pt is open to starting pharmacologic therapy -Last DEXA Scan: 07/04/19   T-Score femoral neck: -2.8  T-Score lumbar spine: -0.9 -Patient is a candidate for pharmacologic treatment due to T-Score < -2.5 in femoral neck -Current treatment  Calcium-Vitamin D 500-200 mg-IU BID -Medications previously tried: fosamax (~2012)  -Recommend weight-bearing and muscle strengthening exercises for building and maintaining bone density. -Counseled on pharmacologic options - bisphosphonates vs Prolia; pt is open to starting Prolia if insurance covers it -Recommend insurance benefit investigation for Prolia - consulting with PCP  Memory difficulties (Goal: prevent progression) -Controlled -Current treatment  Donepezil 10 mg - 1/2 tab BID -Counseled on benefits of donepezil for delayed progression of impairment, not improvement in memory -Recommended to continue current medication  Uveitis (Goal: manage symptoms) -Controlled - pt follows regularly eye specialist; she is not currently taking folic acid -Current treatment  Methotrexate 2.5 mg x 8 tab once weekly (20 mg) Folic acid 1 mg daily - not  taking Simbrinza eye drops Prednisolone 1% eye drops -Counseled on importance of folic acid supplementation while taking methotrexate; advised to restart folic acid 1 mg daily  GERD (Goal: manage symptoms) -Controlled -Current treatment  Famotidine 40 mg daily PRN -Recommended to continue current medication  Health Maintenance -Vaccine gaps: Shingrix -Current therapy:  Multivitamin Ocuvite Vitamin B complex Glucosamine-chondroitin -Pt complains of muscle cramps on near-daily basis; discussed potential causes including dehydration and/or electrolyte deficiencies; pt admits she does not drink enough fluids; advised to increase fluid intake ~64 oz per day -Recommended to continue current medication  Patient Goals/Self-Care Activities Patient will:  - take medications as prescribed as evidenced by patient report and record review focus on medication adherence by routine target a minimum of 150 minutes of moderate intensity exercise weekly engage in dietary modifications by increasing fluid intake       Medication Assistance: None required.  Patient affirms current coverage meets needs.  Compliance/Adherence/Medication fill history: Care Gaps: None  Star-Rating Drugs: None  Patient's preferred pharmacy is:  [id number] [place], [place] - [address]Phone: [phone]: [phone]Phone: [phone]: [phone] Uses pill box? Yes Pt endorses 100% compliance  We discussed: Current pharmacy is preferred with insurance plan and patient is satisfied with pharmacy services Patient decided to: Continue current medication management strategy  Care Plan and Follow Up Patient Decision:  Patient agrees to Care Plan and Follow-up.  Plan: Telephone follow up  appointment with care management team member scheduled for:  6 months  LCharlene Brooke PharmD, BPara March CPP Clinical Pharmacist Practitioner [phone] Medical screening examination/treatment/procedure(s)  were performed by non-physician practitioner and as supervising physician I was immediately available for consultation/collaboration.  I agree with above. Lew Dawes, MD

## 2021-03-26 DIAGNOSIS — H9312 Tinnitus, left ear: Secondary | ICD-10-CM | POA: Diagnosis not present

## 2021-03-26 DIAGNOSIS — R519 Headache, unspecified: Secondary | ICD-10-CM | POA: Diagnosis not present

## 2021-03-28 ENCOUNTER — Other Ambulatory Visit: Payer: Self-pay

## 2021-03-28 ENCOUNTER — Ambulatory Visit: Payer: Medicare PPO | Admitting: Neurology

## 2021-03-28 ENCOUNTER — Encounter: Payer: Self-pay | Admitting: Neurology

## 2021-03-28 ENCOUNTER — Emergency Department (HOSPITAL_BASED_OUTPATIENT_CLINIC_OR_DEPARTMENT_OTHER): Payer: Medicare PPO

## 2021-03-28 ENCOUNTER — Encounter (HOSPITAL_BASED_OUTPATIENT_CLINIC_OR_DEPARTMENT_OTHER): Payer: Self-pay | Admitting: *Deleted

## 2021-03-28 ENCOUNTER — Emergency Department (HOSPITAL_BASED_OUTPATIENT_CLINIC_OR_DEPARTMENT_OTHER)
Admission: EM | Admit: 2021-03-28 | Discharge: 2021-03-28 | Disposition: A | Discharge status: Home / Self Care | Payer: Medicare PPO | Attending: Emergency Medicine | Admitting: Emergency Medicine

## 2021-03-28 VITALS — BP 143/79 | HR 72 | Ht 67.5 in | Wt 133.5 lb

## 2021-03-28 DIAGNOSIS — Z7982 Long term (current) use of aspirin: Secondary | ICD-10-CM | POA: Insufficient documentation

## 2021-03-28 DIAGNOSIS — R519 Headache, unspecified: Secondary | ICD-10-CM | POA: Insufficient documentation

## 2021-03-28 DIAGNOSIS — H9312 Tinnitus, left ear: Secondary | ICD-10-CM | POA: Diagnosis not present

## 2021-03-28 DIAGNOSIS — M792 Neuralgia and neuritis, unspecified: Secondary | ICD-10-CM | POA: Diagnosis not present

## 2021-03-28 MED ORDER — GABAPENTIN 100 MG PO CAPS: 100.0000 mg | ORAL_CAPSULE | Freq: Every day | ORAL | 2 refills | Status: DC

## 2021-03-28 NOTE — Progress Notes (Signed)
Contacted pt, she stated she has been having some discomfort on L side of head, also having touch sensitivity and wants to see Dr Brett Fairy. Please make appt  .      Provider:  Larey Seat, M D  Referring Provider: [name], MD Primary Care Physician:  [name], MD  Chief Complaint  Patient presents with   Follow-up    Rm 10, w husband Wendy Serrano. Pt of Dr. Jannifer Franklin, TOC. Here for L side head discomfort. Was seen today at the ED. Pt had a CT done. Pt reports doing fine at this moment. Pt stil has the frying sound /sensation on her L side.     HPI:  Wendy Serrano is a 75 y.o.  AA female patient is seen here upon from Dr. Jannifer Franklin, who has recently retired. She is a primary patient of Dr Alain Marion . Followed at [facility] for cognitive disorder, tinnitus, had 2 recent visits in ENT, PCP and last night ER-  CC: Tinnitus     HPI Wendy Serrano presents for C/o "frying sounds" in the L ear x 2 weeks - better. C/o sensation on the L head - hypersensitive to touch. She went to ED last night. Has difficulties to explain this sensation- a bubbling or soda pop feeling under the scalp, no numbness.  ENT appt did not bring her closer to a solution, the physician felt it was not tinnitus related.   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 She uses eye drops.   She has alopecia, a hereditary trait, 2002.       Review of Systems: Out of a complete 14 system review, the patient complains of only the following symptoms, and all other reviewed systems are negative.   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 They are doing well   Marriage is in good health and retirement is goodEnd-of-life: provided packet of information and forms for consideration (Oct'11)      Lives with husband   Right handed   Caffeine: zero   Social Determinants of Health   Financial Resource Strain: Not on file  Food Insecurity: Not on file  Transportation Needs: Not on file  Physical Activity: Not on file  Stress: Not on file  Social Connections: Not on file  Intimate Partner Violence: Not on file    Family History  Problem Relation Age of Onset   Prostate cancer Father        died   Pancreatic cancer Mother        died   Diabetes Mother    Hyperlipidemia Sister     Past Medical History:  Diagnosis  Date   Allergic rhinitis    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   Menopausal symptoms    Renal cyst    bilateral   Rotator cuff tear    Yeast infection     Past Surgical History:  Procedure Laterality Date   ROTATOR CUFF REPAIR Left 08/2009   Left shoulder-rotator cuff repair [facility])   WISDOM TOOTH EXTRACTION      Current Outpatient Medications  Medication Sig Dispense Refill   aspirin 81 MG chewable tablet Chew 81 mg by  mouth daily.     b complex vitamins tablet Take 1 tablet by mouth daily. 100 tablet 3   calcium-vitamin D (OSCAL WITH D) 500-200 MG-UNIT per tablet Take 1 tablet by mouth 2 (two) times daily.     donepezil (ARICEPT) 10 MG tablet Take 1 tablet (10 mg total) by mouth at bedtime. 1 tablet at bedtime 90 tablet 3   FOLIC ACID PO Take 4 tablets by mouth daily.     glucosamine-chondroitin 500-400 MG tablet Take 1 tablet by mouth 2 (two) times daily.     methotrexate (RHEUMATREX) 2.5 MG tablet   3   Multiple Vitamin (MULTIVITAMIN) tablet Take 1 tablet by mouth daily.     Multiple Vitamins-Minerals (OCUVITE ADULT 50+) CAPS Take 1 capsule by mouth daily.     prednisoLONE acetate (PRED FORTE) 1 % ophthalmic suspension Place 1 drop into both eyes 2 (two) times daily.     SIMBRINZA 1-0.2 % SUSP Place 1 drop into both eyes 3 (three) times daily.  6   No current facility-administered medications for this visit.    Allergies as of 03/28/2021   (No Known Allergies)   CT HEAD WITHOUT CONTRAST   TECHNIQUE: Contiguous axial images were obtained from the base of the skull through the vertex without intravenous contrast.   COMPARISON:  02/11/2014   FINDINGS: Brain: There is no mass, hemorrhage or extra-axial collection. There is generalized atrophy without lobar predilection. Hypodensity of the white matter is most commonly associated with chronic microvascular disease.   Vascular: No abnormal hyperdensity of the major intracranial arteries or dural venous sinuses. No intracranial atherosclerosis.   Skull: The visualized skull base, calvarium and extracranial soft tissues are normal.   Sinuses/Orbits: No fluid levels or advanced mucosal thickening of the visualized paranasal sinuses. No mastoid or middle ear effusion. The orbits are normal.   IMPRESSION: Generalized atrophy and chronic microvascular ischemia without acute intracranial abnormality.     Electronically Signed   By: Ulyses Jarred M.D.   On: 03/28/2021 03:42  IMPRESSION: This MRI of the brain without contrast shows the following: 1.   Mild generalized cortical atrophy, probably normal for age and unchanged compared to the 2019 MRI. 2.   Scattered T2/FLAIR hyperintense single and confluent foci predominantly in the hemispheres and a few foci in the pons and cerebellum.  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  None of the foci appear to be acute and there is no progression compared to the 2019 MRI.  3.   No acute findings.       INTERPRETING PHYSICIAN:  Richard A. Felecia Shelling, MD, PhD, Charlynn Grimes Vitals: BP (!) 143/79   Pulse 72   Ht 5' 7.5" (1.715 m)   Wt 133 lb 8 oz (60.6 kg)   BMI 20.60 kg/m  Last Weight:  Wt Readings from Last 1 Encounters:  03/28/21 133 lb 8 oz (60.6 kg)   Last Height:   Ht Readings from Last 1 Encounters:  03/28/21 5' 7.5" (1.715 m)    Physical exam:  General: The patient is awake, alert and appears not in acute distress. The patient is well groomed. Head: Normocephalic, atraumatic. Neck is supple.  Cardiovascular:  Regular rate and rhythm, without  murmurs or carotid bruit, and without distended neck veins. Respiratory: Lungs are clear to auscultation. Skin:  Without evidence of edema, or rash Trunk: BMI is 20.6  -this  patient has normal posture.  Neurologic exam : The patient is awake and alert, oriented to place and time.  Memory subjective  described as impaired  There is a normal attention span & concentration ability. Speech is fluent without  dysarthria, dysphonia or aphasia. Mood and affect are appropriate.  Cranial nerves: Pupils are equal and briskly reactive to light. Funduscopic exam with evidence of pallor. Extraocular movements  in vertical and horizontal planes intact and without nystagmus.  Visual fields by finger perimetry are intact. Hearing to finger rub intact.   Facial sensation intact to fine touch.  Testing by Q-tip  revealed the same sensation for fine touch at the cheekbone, forehead, eyebrow region, temple region and retroauricular.  I also tested the Weber transmission and there is no impairment for air or bone-conduction. Facial motor strength is symmetric and tongue and uvula move midline. Tongue protrusion into either cheek is normal. Shoulder shrug is normal.   Motor exam:   Normal tone ,muscle bulk and symmetric  strength in all extremities.  Sensory:  Fine touch, pinprick and vibration were tested in all extremities. Proprioception was normal.  Coordination: Rapid alternating movements in the fingers/hands were normal. Finger-to-nose maneuver  normal without evidence of ataxia, dysmetria or tremor.  Gait and station: Patient walks without assistive device  Proximal and distal strength within normal limits. Stance is stable.   Tandem gait is unfragmented.  Romberg testing is negative   Deep tendon reflexes: in the  upper and lower extremities are symmetric and intact. Babinski maneuver response is downgoing.   Assessment:  After physical and neurologic examination, review of laboratory studies, imaging, neurophysiology testing and pre-existing records, assessment is that of :  Looking at the CT and MRI scan from earlier this year and that the patient reports that the symptoms have been there before had disappeared for a while and now returned I do wonder if this could be a neuralgic component.  There is a hypersensitivity at that area of the skin but there is no numbness.  So my concern is if we should use a sodium channel blocker .She is not obviously anxious, but she reports some stress,  She made me aware she sleeps well, her Apetite has not changed; a short sleeper- she gets to bed at 4.30 Am and gets up at 10.30- always has been a night owl, ever since college.  She had Covid at easter 2021, just sailed through.   Plan:  Treatment plan and additional workup :  something in the gabapentin  family to reduce the sensation.   I would like to start  with 100 mg at night and she may increase to 200 if she finds no relief at 100.  We will not use it in daytime as I feel that it would make her too sleepy and happy to increase by 100s if tolerated until she has the best possible relief.  Gabapentin 100 mg at midnight po, and increase to 200 mg by week 2 if tolerated.    Rv in 3 months with either NP Slack or me.   Asencion Partridge Takisha Pelle MD 03/28/2021

## 2021-03-28 NOTE — Patient Instructions (Signed)
I will ask you to take gabapentin, 100 mg at midnight by mouth for 14 days, then increase by 100  mg to 2 at night.  You can go up to 3 at night by mouth until we meet again.  Neuralgia Neuralgia is a type of headache that causes brief episodes of very bad pain in the back of the head. Pain from occipital neuralgia may spread (radiate) to other parts of the head. These headaches may be caused by irritation of the nerves that leave the spinal cord high up in the neck, just below the base of the skull (occipital nerves). The occipital nerves transmit sensations from the back of the head, the top of the head, and the areas behind the ears. What are the causes? This condition can occur without any known cause (primary headache syndrome). In other cases, this condition is caused by pressure on or irritation of one of the two occipital nerves. Pressure and irritation may be due to: Muscle spasm in the neck. Neck injury. Wear and tear of the vertebrae in the neck (osteoarthritis). Disease of the disks that separate the vertebrae. Swollen blood vessels that put pressure on the occipital nerves. Infections. Tumors. Diabetes. What are the signs or symptoms? This condition causes brief burning, stabbing, electric, shocking, or shooting pain in the back of the head that can radiate to the top of the head. It can happen on one side or both sides of the head. It can also cause: Pain behind the eye. Pain triggered by neck movement or hair brushing. Scalp tenderness. Aching in the back of the head between episodes of very bad pain. Pain that gets worse with exposure to bright lights. How is this diagnosed? Your health care provider may diagnose the condition based on a physical exam and your symptoms. Tests may be done, such as: Imaging studies of the brain and neck (cervical spine), such as an MRI or CT scan. These look for causes of pinched nerves. Applying pressure to the nerves in the neck to try to  re-create the pain. Injection of numbing medicine into the occipital nerve areas to see if pain goes away (diagnostic nerve block). How is this treated? Treatment for this condition may begin with simple measures, such as: Rest. Massage. Applying heat or cold to the area. Over-the-counter pain relievers. If these measures do not work, you may need other treatments, including: Medicines, such as: Prescription-strength anti-inflammatory medicines. Muscle relaxants. Anti-seizure medicines, which can relieve pain. Antidepressants, which can relieve pain. Injected medicines, such as medicines that numb the area (local anesthetic) and steroids. Pulsed radiofrequency ablation. This is when wires are implanted to deliver electrical impulses that block pain signals from the occipital nerve. Surgery to relieve nerve pressure. Physical therapy. Follow these instructions at home: Managing pain   Avoid any activities that cause pain. Rest when you have an attack of pain. Try gentle massage to relieve pain. Try a different pillow or sleeping position. If directed, apply heat to the affected area as often as told by your health care provider. Use the heat source that your health care provider recommends, such as a moist heat pack or a heating pad. Place a towel between your skin and the heat source. Leave the heat on for 20-30 minutes. Remove the heat if your skin turns bright red. This is especially important if you are unable to feel pain, heat, or cold. You have a greater risk of getting burned. If directed, put ice on the back of  your head and neck area. To do this: Put ice in a plastic bag. Place a towel between your skin and the bag. Leave the ice on for 20 minutes, 2-3 times a day. Remove the ice if your skin turns bright red. This is very important. If you cannot feel pain, heat, or cold, you have a greater risk of damage to the area. General instructions Take over-the-counter and  prescription medicines only as told by your health care provider. Avoid things that make your symptoms worse, such as bright lights. Try to stay active. Get regular exercise that does not cause pain. Ask your health care provider to suggest safe exercises for you. Work with a physical therapist to learn stretching exercises you can do at home. Practice good posture. Keep all follow-up visits. This is important. Contact a health care provider if: Your medicine is not working. You have new or worsening symptoms. Get help right away if: You have very bad head pain that does not go away. You have a sudden change in vision, balance, or speech. These symptoms may represent a serious problem that is an emergency. Do not wait to see if the symptoms will go away. Get medical help right away. Call your local emergency services (911 in the [place] Do not drive yourself to the hospital. Summary Occipital neuralgia is a type of headache that causes brief episodes of very bad pain in the back of the head. Pain from occipital neuralgia may spread (radiate) to other parts of the head. Treatment for this condition includes rest, massage, and medicines. This information is not intended to replace advice given to you by your health care provider. Make sure you discuss any questions you have with your health care provider. Document Revised: 02/26/2020 Document Reviewed: 02/26/2020 Elsevier Patient Education  2022 [employer].

## 2021-03-28 NOTE — ED Provider Notes (Signed)
[facility] EMERGENCY DEPT Provider Note   CSN: [id number] Arrival date & time: 03/28/21  0235     History Chief Complaint  Patient presents with   Headache    Dreyah Montrose is a 75 y.o. female.   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   Menopausal symptoms    Renal cyst    bilateral   Rotator cuff tear    Yeast infection     Patient Active Problem List   Diagnosis Date Noted   Headache 03/12/2021   Ear canal abrasion, right, initial encounter 03/01/2021   GERD (gastroesophageal reflux disease) 01/24/2021   Leg cramps 01/24/2021   Throat discomfort 12/19/2020   Atherosclerosis of aorta (HCC) 10/11/2020   Foot pain, bilateral 05/14/2020   Sleep pattern disturbance 05/11/2020   Neck pain 05/11/2020   Cervicogenic headache 09/21/2019   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   ALLERGIC RHINITIS 05/12/2007   HERPES SIMPLEX, UNCOMPLICATED 18/56/3149    Past Surgical History:  Procedure Laterality Date   ROTATOR CUFF REPAIR Left 08/2009   Left shoulder-rotator cuff repair [facility])   WISDOM TOOTH EXTRACTION       OB History     Gravida  0   Para  0   Term  0   Preterm  0   AB  0   Living  0      SAB  0   IAB  0   Ectopic  0   Multiple  0   Live Births              Family History  Problem Relation Age of Onset   Prostate cancer Father        died   Pancreatic cancer Mother        died   Diabetes Mother    Hyperlipidemia Sister     Social History   Tobacco Use   Smoking status: Never  Smokeless tobacco: Never  Substance Use Topics   Alcohol use: No   Drug use: No    Home Medications Prior to Admission medications   Medication Sig Start Date End Date Taking? Authorizing Provider  aspirin 81 MG chewable tablet Chew 81 mg by mouth daily.    [provider]  b complex vitamins tablet Take 1 tablet by mouth daily. 06/16/19   Plotnikov, Evie Lacks, MD  calcium-vitamin D (OSCAL WITH D) 500-200 MG-UNIT per tablet Take 1 tablet by mouth 2 (two) times daily.    [provider]  donepezil (ARICEPT) 10 MG tablet Take 1 tablet (10 mg total) by mouth at bedtime. 1 tablet at bedtime 03/12/21   Plotnikov, Evie Lacks, MD  famotidine (PEPCID) 40 MG tablet Take 1 tablet (40 mg total) by mouth daily. 12/22/20   Plotnikov, Evie Lacks, MD  glucosamine-chondroitin 500-400 MG tablet Take 1 tablet by mouth 2 (two) times daily.    [provider]  methotrexate (RHEUMATREX) 2.5 MG  tablet  08/09/15   [provider]  Multiple Vitamin (MULTIVITAMIN) tablet Take 1 tablet by mouth daily.    [provider]  Multiple Vitamins-Minerals (OCUVITE ADULT 50+) CAPS Take 1 capsule by mouth daily.    [provider]  prednisoLONE acetate (PRED FORTE) 1 % ophthalmic suspension Place 1 drop into both eyes 2 (two) times daily.    [provider]  SIMBRINZA 1-0.2 % SUSP Place 1 drop into both eyes 3 (three) times daily. 07/29/15   [provider]  valACYclovir (VALTREX) 1000 MG tablet Take 1 tablet (1,000 mg total) by mouth 3 (three) times daily. 03/12/21   Plotnikov, Evie Lacks, MD    Allergies    Patient has no known allergies.  Review of Systems   Review of Systems  Eyes:  Negative for pain.  Gastrointestinal:  Negative for abdominal pain.  Neurological:  Positive for headaches.  All other systems reviewed and are negative.  Physical Exam Updated Vital Signs BP 123/88 (BP Location: Right Arm)   Pulse 60   Temp 98 F (36.7 C) (Oral)   Resp 16   SpO2 100%   Physical Exam Vitals and nursing note reviewed.  Constitutional:      Appearance: She is well-developed.  HENT:     Head: Normocephalic and atraumatic.  Eyes:     Extraocular Movements: Extraocular movements intact.  Cardiovascular:     Rate and Rhythm: Normal rate and regular rhythm.  Pulmonary:     Effort: Pulmonary effort is normal. No respiratory distress.     Breath sounds: No stridor. No wheezing.  Abdominal:     General: There is no distension.     Palpations: Abdomen is soft.     Tenderness: There is no abdominal tenderness.  Musculoskeletal:        General: Normal range of motion.     Cervical back: Normal range of motion.  Skin:    General: Skin is warm and dry.  Neurological:     Mental Status: She is alert. Mental status is at baseline.     Cranial Nerves: No cranial nerve deficit.     Sensory: No sensory deficit.     Motor: No weakness.      Coordination: Coordination normal.     Comments: Has some repetitive history telling, but is otherwise oriented  Psychiatric:        Mood and Affect: Mood is not anxious.    ED Results / Procedures / Treatments  Labs (all labs ordered are listed, but only abnormal results are displayed) Labs Reviewed - No data to display  EKG None  Radiology CT Head Wo Contrast  Result Date: 03/28/2021 CLINICAL DATA:  Headache EXAM: CT HEAD WITHOUT CONTRAST TECHNIQUE: Contiguous axial images were obtained from the base of the skull through the vertex without intravenous contrast. COMPARISON:  02/11/2014 FINDINGS: Brain: There is no mass, hemorrhage or extra-axial collection. There is generalized atrophy without lobar predilection. Hypodensity of the white matter is most commonly associated with chronic microvascular disease. Vascular: No abnormal hyperdensity of the major intracranial arteries or dural venous sinuses. No intracranial atherosclerosis. Skull: The visualized skull base, calvarium and extracranial soft tissues are normal. Sinuses/Orbits: No fluid levels or advanced mucosal thickening of the visualized paranasal sinuses. No mastoid or middle ear effusion. The orbits are normal. IMPRESSION: Generalized atrophy and chronic microvascular ischemia without acute intracranial abnormality. Electronically Signed   By: Ulyses Jarred M.D.   On: 03/28/2021 03:42    Procedures Procedures   Medications Ordered in ED Medications - No data to display  ED Course  I have reviewed the triage vital signs and the nursing notes.  Pertinent labs & imaging results that were available during my care of the patient were reviewed by me and considered in my medical decision making (see chart for details).    MDM Rules/Calculators/A&P                         Is a chronic headache that she just had it was a bit worse tonight.  Her CT scan was similar to previous MRI.  Her headache is improved.  She has neurologic  deficits.  No indication for further imaging, work-up.  We will try to reach out to her neurology office to see if they can seen her sooner than in February.  Final Clinical Impression(s) / ED Diagnoses Final diagnoses:  Nonintractable headache, unspecified chronicity pattern, unspecified headache type    Rx / DC Orders ED Discharge Orders     None        Carleena Mires, Corene Cornea, MD 03/28/21 [phone]

## 2021-03-28 NOTE — ED Triage Notes (Signed)
Pt is here for headache x 2 weeks in left back of head without any neuro deficits.  Pt has had a physical recently and mentioned this at this time. Not associated with any trauma.

## 2021-03-29 ENCOUNTER — Ambulatory Visit (INDEPENDENT_AMBULATORY_CARE_PROVIDER_SITE_OTHER): Payer: Medicare PPO

## 2021-03-29 DIAGNOSIS — H40013 Open angle with borderline findings, low risk, bilateral: Secondary | ICD-10-CM | POA: Diagnosis not present

## 2021-03-29 DIAGNOSIS — H209 Unspecified iridocyclitis: Secondary | ICD-10-CM | POA: Diagnosis not present

## 2021-03-29 DIAGNOSIS — H40043 Steroid responder, bilateral: Secondary | ICD-10-CM | POA: Diagnosis not present

## 2021-03-29 DIAGNOSIS — H31092 Other chorioretinal scars, left eye: Secondary | ICD-10-CM | POA: Diagnosis not present

## 2021-03-29 DIAGNOSIS — Z79899 Other long term (current) drug therapy: Secondary | ICD-10-CM | POA: Diagnosis not present

## 2021-03-29 DIAGNOSIS — Z0000 Encounter for general adult medical examination without abnormal findings: Secondary | ICD-10-CM

## 2021-03-29 DIAGNOSIS — H35363 Drusen (degenerative) of macula, bilateral: Secondary | ICD-10-CM | POA: Diagnosis not present

## 2021-03-29 DIAGNOSIS — H35372 Puckering of macula, left eye: Secondary | ICD-10-CM | POA: Diagnosis not present

## 2021-03-29 DIAGNOSIS — H35351 Cystoid macular degeneration, right eye: Secondary | ICD-10-CM | POA: Diagnosis not present

## 2021-03-29 DIAGNOSIS — R519 Headache, unspecified: Secondary | ICD-10-CM | POA: Diagnosis not present

## 2021-03-29 NOTE — Patient Instructions (Signed)
Wendy Serrano , Thank you for taking time to come for your Medicare Wellness Visit. I appreciate your ongoing commitment to your health goals. Please review the following plan we discussed and let me know if I can assist you in the future.   Screening recommendations/referrals: Colonoscopy:  no longer required  Mammogram: no longer required  Bone Density: 07/04/2019 Recommended yearly ophthalmology/optometry visit for glaucoma screening and checkup Recommended yearly dental visit for hygiene and checkup  Vaccinations: Influenza vaccine: completed  Pneumococcal vaccine: completed  Tdap vaccine: 06/14/2018 Shingles vaccine: will obtain     Advanced directives: will provide copies   Conditions/risks identified: none   Next appointment: none    Preventive Care 76 Years and Older, Female Preventive care refers to lifestyle choices and visits with your health care provider that can promote health and wellness. What does preventive care include? A yearly physical exam. This is also called an annual well check. Dental exams once or twice a year. Routine eye exams. Ask your health care provider how often you should have your eyes checked. Personal lifestyle choices, including: Daily care of your teeth and gums. Regular physical activity. Eating a healthy diet. Avoiding tobacco and drug use. Limiting alcohol use. Practicing safe sex. Taking low-dose aspirin every day. Taking vitamin and mineral supplements as recommended by your health care provider. What happens during an annual well check? The services and screenings done by your health care provider during your annual well check will depend on your age, overall health, lifestyle risk factors, and family history of disease. Counseling  Your health care provider may ask you questions about your: Alcohol use. Tobacco use. Drug use. Emotional well-being. Home and relationship well-being. Sexual activity. Eating habits. History of  falls. Memory and ability to understand (cognition). Work and work Statistician. Reproductive health. Screening  You may have the following tests or measurements: Height, weight, and BMI. Blood pressure. Lipid and cholesterol levels. These may be checked every 5 years, or more frequently if you are over 93 years old. Skin check. Lung cancer screening. You may have this screening every year starting at age 39 if you have a 30-pack-year history of smoking and currently smoke or have quit within the past 15 years. Fecal occult blood test (FOBT) of the stool. You may have this test every year starting at age 32. Flexible sigmoidoscopy or colonoscopy. You may have a sigmoidoscopy every 5 years or a colonoscopy every 10 years starting at age 32. Hepatitis C blood test. Hepatitis B blood test. Sexually transmitted disease (STD) testing. Diabetes screening. This is done by checking your blood sugar (glucose) after you have not eaten for a while (fasting). You may have this done every 1-3 years. Bone density scan. This is done to screen for osteoporosis. You may have this done starting at age 77. Mammogram. This may be done every 1-2 years. Talk to your health care provider about how often you should have regular mammograms. Talk with your health care provider about your test results, treatment options, and if necessary, the need for more tests. Vaccines  Your health care provider may recommend certain vaccines, such as: Influenza vaccine. This is recommended every year. Tetanus, diphtheria, and acellular pertussis (Tdap, Td) vaccine. You may need a Td booster every 10 years. Zoster vaccine. You may need this after age 65. Pneumococcal 13-valent conjugate (PCV13) vaccine. One dose is recommended after age 56. Pneumococcal polysaccharide (PPSV23) vaccine. One dose is recommended after age 73. Talk to your health care provider about  which screenings and vaccines you need and how often you need  them. This information is not intended to replace advice given to you by your health care provider. Make sure you discuss any questions you have with your health care provider. Document Released: 05/25/2015 Document Revised: 01/16/2016 Document Reviewed: 02/27/2015 Elsevier Interactive Patient Education  2017 [employer] Prevention in the Home Falls can cause injuries. They can happen to people of all ages. There are many things you can do to make your home safe and to help prevent falls. What can I do on the outside of my home? Regularly fix the edges of walkways and driveways and fix any cracks. Remove anything that might make you trip as you walk through a door, such as a raised step or threshold. Trim any bushes or trees on the path to your home. Use bright outdoor lighting. Clear any walking paths of anything that might make someone trip, such as rocks or tools. Regularly check to see if handrails are loose or broken. Make sure that both sides of any steps have handrails. Any raised decks and porches should have guardrails on the edges. Have any leaves, snow, or ice cleared regularly. Use sand or salt on walking paths during winter. Clean up any spills in your garage right away. This includes oil or grease spills. What can I do in the bathroom? Use night lights. Install grab bars by the toilet and in the tub and shower. Do not use towel bars as grab bars. Use non-skid mats or decals in the tub or shower. If you need to sit down in the shower, use a plastic, non-slip stool. Keep the floor dry. Clean up any water that spills on the floor as soon as it happens. Remove soap buildup in the tub or shower regularly. Attach bath mats securely with double-sided non-slip rug tape. Do not have throw rugs and other things on the floor that can make you trip. What can I do in the bedroom? Use night lights. Make sure that you have a light by your bed that is easy to reach. Do not use  any sheets or blankets that are too big for your bed. They should not hang down onto the floor. Have a firm chair that has side arms. You can use this for support while you get dressed. Do not have throw rugs and other things on the floor that can make you trip. What can I do in the kitchen? Clean up any spills right away. Avoid walking on wet floors. Keep items that you use a lot in easy-to-reach places. If you need to reach something above you, use a strong step stool that has a grab bar. Keep electrical cords out of the way. Do not use floor polish or wax that makes floors slippery. If you must use wax, use non-skid floor wax. Do not have throw rugs and other things on the floor that can make you trip. What can I do with my stairs? Do not leave any items on the stairs. Make sure that there are handrails on both sides of the stairs and use them. Fix handrails that are broken or loose. Make sure that handrails are as long as the stairways. Check any carpeting to make sure that it is firmly attached to the stairs. Fix any carpet that is loose or worn. Avoid having throw rugs at the top or bottom of the stairs. If you do have throw rugs, attach them to the floor with  carpet tape. Make sure that you have a light switch at the top of the stairs and the bottom of the stairs. If you do not have them, ask someone to add them for you. What else can I do to help prevent falls? Wear shoes that: Do not have high heels. Have rubber bottoms. Are comfortable and fit you well. Are closed at the toe. Do not wear sandals. If you use a stepladder: Make sure that it is fully opened. Do not climb a closed stepladder. Make sure that both sides of the stepladder are locked into place. Ask someone to hold it for you, if possible. Clearly mark and make sure that you can see: Any grab bars or handrails. First and last steps. Where the edge of each step is. Use tools that help you move around (mobility aids)  if they are needed. These include: Canes. Walkers. Scooters. Crutches. Turn on the lights when you go into a dark area. Replace any light bulbs as soon as they burn out. Set up your furniture so you have a clear path. Avoid moving your furniture around. If any of your floors are uneven, fix them. If there are any pets around you, be aware of where they are. Review your medicines with your doctor. Some medicines can make you feel dizzy. This can increase your chance of falling. Ask your doctor what other things that you can do to help prevent falls. This information is not intended to replace advice given to you by your health care provider. Make sure you discuss any questions you have with your health care provider. Document Released: 02/22/2009 Document Revised: 10/04/2015 Document Reviewed: 06/02/2014 Elsevier Interactive Patient Education  2017 [employer].

## 2021-03-29 NOTE — Progress Notes (Addendum)
Subjective:   Wendy Serrano is a 75 y.o. female who presents for Medicare Annual (Subsequent) preventive examination.I connected with Sullivan Lone today by telephone and verified that I am speaking with the correct person using two identifiers. Location patient: home Location provider: work Persons participating in the virtual visit: patient, provider.   I discussed the limitations, risks, security and privacy concerns of performing an evaluation and management service by telephone and the availability of in person appointments. I also discussed with the patient that there may be a patient responsible charge related to this service. The patient expressed understanding and verbally consented to this telephonic visit.    Interactive audio and video telecommunications were attempted between this provider and patient, however failed, due to patient having technical difficulties OR patient did not have access to video capability.  We continued and completed visit with audio only.    Review of Systems     Cardiac Risk Factors include: advanced age (>19men, >14 women);hypertension     Objective:    Today's Vitals   There is no height or weight on file to calculate BMI.  Advanced Directives 03/29/2021 03/28/2021 12/07/2019 06/01/2015 02/11/2014  Does Patient Have a Medical Advance Directive? Yes No Yes No No  Type of Paramedic of [place];Living will - [place];Living will - -  Does patient want to make changes to medical advance directive? - - No - Patient declined - -  Copy of [place] in Chart? No - copy requested - No - copy requested - -  Would patient like information on creating a medical advance directive? - - - Yes - Educational materials given No - patient declined information    Current Medications (verified) Outpatient Encounter Medications as of 03/29/2021  Medication Sig   aspirin 81 MG chewable tablet Chew  81 mg by mouth daily.   b complex vitamins tablet Take 1 tablet by mouth daily.   calcium-vitamin D (OSCAL WITH D) 500-200 MG-UNIT per tablet Take 1 tablet by mouth 2 (two) times daily.   donepezil (ARICEPT) 10 MG tablet Take 1 tablet (10 mg total) by mouth at bedtime. 1 tablet at bedtime   FOLIC ACID PO Take 4 tablets by mouth daily.   glucosamine-chondroitin 500-400 MG tablet Take 1 tablet by mouth 2 (two) times daily.   methotrexate (RHEUMATREX) 2.5 MG tablet    Multiple Vitamin (MULTIVITAMIN) tablet Take 1 tablet by mouth daily.   Multiple Vitamins-Minerals (OCUVITE ADULT 50+) CAPS Take 1 capsule by mouth daily.   prednisoLONE acetate (PRED FORTE) 1 % ophthalmic suspension Place 1 drop into both eyes 2 (two) times daily.   SIMBRINZA 1-0.2 % SUSP Place 1 drop into both eyes 3 (three) times daily.   gabapentin (NEURONTIN) 100 MG capsule Take 1 capsule (100 mg total) by mouth at bedtime. (Patient not taking: Reported on 03/29/2021)   No facility-administered encounter medications on file as of 03/29/2021.    Allergies (verified) Patient has no known allergies.   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   Menopausal symptoms    Renal cyst    bilateral   Rotator cuff tear    Yeast infection    Past Surgical History:  Procedure Laterality Date   ROTATOR CUFF REPAIR Left 08/2009   Left shoulder-rotator cuff repair [facility])   WISDOM TOOTH EXTRACTION     Family History  Problem Relation Age of Onset   Prostate cancer Father        died   Pancreatic cancer  Mother        died   Diabetes Mother    Hyperlipidemia Sister    Social History   Socioeconomic History   Marital status: Married    Spouse name: [name]   Number of children: 0   Years of education: Not on file   Highest education level: Not on file  Occupational History   Occupation: Retired  Tobacco Use   Smoking status: Never   Smokeless tobacco: Never  Substance and Sexual Activity   Alcohol use: No   Drug use: No   Sexual activity: Never    Birth control/protection: Post-menopausal  Other Topics Concern   Not on file  Social History Narrative   [employer] BA; MEd-teaching;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 They are doing well   Marriage is in good health and retirement is goodEnd-of-life: provided packet of information and forms for consideration (Oct'11)      Lives with husband   Right handed   Caffeine: zero   Social Determinants of Health   Financial Resource Strain: Low Risk    Difficulty of Paying Living Expenses: Not hard at all  Food Insecurity: No Food Insecurity   Worried About Charity fundraiser in the Last Year: Never true   [place] in the Last Year: Never true  Transportation Needs: No Transportation Needs   Lack of Transportation (Medical): No   Lack of Transportation (Non-Medical): No  Physical Activity: Insufficiently Active   Days of Exercise per Week: 3 days   Minutes of Exercise per Session: 30 min  Stress: No Stress Concern Present   Feeling of Stress : Not at all  Social Connections: Socially Integrated   Frequency of Communication with Friends and Family: Twice a week   Frequency of Social Gatherings with Friends and Family: Twice a week   Attends Religious Services: More than 4 times per year   Active Member of [employer] or Organizations: Yes   Attends Archivist Meetings: 1 to 4 times per year   Marital Status:  Married    Tobacco Counseling Counseling given: Not Answered   Clinical Intake:  Pre-visit preparation completed: Yes  Pain : No/denies pain     Nutritional Risks: None Diabetes: No  How often do you need to have someone help you when you read instructions, pamphlets, or other written materials from your doctor or pharmacy?: 1 - Never What is the last grade level you completed in school?: masters  Diabetic?no  Interpreter Needed?: No  Information entered by :: L.Shereese Bonnie,LPN   Activities of Daily Living In your present state of health, do you have any difficulty performing the following activities: 03/29/2021  Hearing? N  Vision? N  Difficulty concentrating or making decisions? N  Walking or climbing stairs? N  Dressing or bathing? N  Doing errands, shopping?  N  Preparing Food and eating ? N  Using the Toilet? N  In the past six months, have you accidently leaked urine? N  Do you have problems with loss of bowel control? N  Managing your Medications? N  Managing your Finances? N  Housekeeping or managing your Housekeeping? N  Some recent data might be hidden    Patient Care Team: Plotnikov, Evie Lacks, MD as PCP - General (Internal Medicine) Leo Grosser, Seymour Bars, MD (Inactive) (Obstetrics and Gynecology) Marybelle Killings, MD (Orthopedic Surgery) Ara Kussmaul, MD (Ophthalmology) Paralee Cancel, MD as Consulting Physician (Orthopedic Surgery) Feliz Beam, MD as Referring Physician (Ophthalmology) Carmelina Dane, MD as Referring Physician (Ophthalmology) Servando Salina, MD as Consulting Physician (Obstetrics and Gynecology) Melida Quitter, MD as Consulting Physician (Otolaryngology) Kathrynn Ducking, MD as Consulting Physician (Neurology) Charlton Haws, [facility] as Pharmacist (Pharmacist)  Indicate any recent Medical Services you may have received from other than [facility] providers in the past year (date may be approximate).     Assessment:   This is a  routine wellness examination for Wendy Serrano.  Hearing/Vision screen Vision Screening - Comments:: Annual eye wears glasses   Dietary issues and exercise activities discussed: Current Exercise Habits: Home exercise routine, Type of exercise: walking, Time (Minutes): 30, Frequency (Times/Week): 3, Weekly Exercise (Minutes/Week): 90, Intensity: Mild, Exercise limited by: None identified   Goals Addressed               This Visit's Progress     Patient Stated (pt-stated)   On track     To maintain my current health status by continuing to eat healthy and stay physically active & socially active.       Depression Screen PHQ 2/9 Scores 03/29/2021 03/29/2021 12/19/2020 12/07/2019 06/19/2019 06/16/2019 06/14/2018  PHQ - 2 Score 0 0 0 0 0 0 0  PHQ- 9 Score - - 0 - - - -    Fall Risk Fall Risk  03/29/2021 12/19/2020 12/07/2019 06/19/2019 06/16/2019  Falls in the past year? 0 0 0 0 0  Number falls in past yr: 0 0 0 0 -  Injury with Fall? 0 0 0 0 -  Risk for fall due to : - No Fall Risks No Fall Risks - -  Follow up Falls evaluation completed - Falls evaluation completed - Falls evaluation completed    FALL RISK PREVENTION PERTAINING TO THE HOME:  Any stairs in or around the home? Yes  If so, are there any without handrails? No  Home free of loose throw rugs in walkways, pet beds, electrical cords, etc? Yes  Adequate lighting in your home to reduce risk of falls? Yes   ASSISTIVE DEVICES UTILIZED TO PREVENT FALLS:  Life alert? No  Use of a cane, walker or w/c? No  Grab bars in the bathroom? Yes  Shower chair or bench in shower? Yes  Elevated toilet seat or a handicapped toilet? No    Cognitive Function: Normal cognitive status assessed by direct observation by this Nurse Health Advisor. No abnormalities found.   MMSE - Mini Mental State Exam 11/01/2020 05/03/2020 11/09/2018 03/29/2018  Orientation to time 4 5 4 5   Orientation to Place 5 5 5 5   Registration 3 3 3 3   Attention/ Calculation 1  5 5 5   Recall 1 2 1  0  Language- name 2 objects 2 2 2 2   Language- repeat 1 1 1 1   Language- follow 3 step command 3 3 3 3   Language- read &  follow direction 1 1 1 1   Write a sentence 1 1 1 1   Copy design 1 1 0 1  Total score 23 29 26 27         Immunizations Immunization History  Administered Date(s) Administered   Fluad Quad(high Dose 65+) 01/12/2019   Influenza Split 02/26/2011, 03/01/2012   Influenza Whole 02/10/2008, 02/15/2009, 02/19/2010   Influenza, High Dose Seasonal PF 03/09/2013, 03/21/2015, 04/01/2016, 03/03/2017, 01/23/2021   Influenza,inj,Quad PF,6+ Mos 05/10/2014, 02/13/2018   PFIZER(Purple Top)SARS-COV-2 Vaccination 05/25/2019, 06/15/2019, 02/04/2020, 08/11/2020   Pfizer Covid-19 Vaccine Bivalent Booster 76yrs & up 01/23/2021   Pneumococcal Conjugate-13 03/23/2013   Pneumococcal Polysaccharide-23 02/19/2010, 05/18/2013   Td 02/10/2008   Tdap 06/14/2018   Zoster, Live 07/04/2009    TDAP status: Up to date  Flu Vaccine status: Up to date  Pneumococcal vaccine status: Up to date  Covid-19 vaccine status: Completed vaccines  Qualifies for Shingles Vaccine? Yes   Zostavax completed No   Shingrix Completed?: No.    Education has been provided regarding the importance of this vaccine. Patient has been advised to call insurance company to determine out of pocket expense if they have not yet received this vaccine. Advised may also receive vaccine at local pharmacy or Health Dept. Verbalized acceptance and understanding.  Screening Tests Health Maintenance  Topic Date Due   Zoster Vaccines- Shingrix (1 of 2) Never done   TETANUS/TDAP  06/14/2028   Pneumonia Vaccine 37+ Years old  Completed   INFLUENZA VACCINE  Completed   DEXA SCAN  Completed   COVID-19 Vaccine  Completed   Hepatitis C Screening  Completed   HPV VACCINES  Aged Out   COLONOSCOPY (Pts 45-45yrs Insurance coverage will need to be confirmed)  Discontinued    Health Maintenance  Health  Maintenance Due  Topic Date Due   Zoster Vaccines- Shingrix (1 of 2) Never done    Colorectal cancer screening: No longer required.   Mammogram status: No longer required due to age.  Bone Density status: Completed 07/04/2019. Results reflect: Bone density results: OSTEOPENIA. Repeat every 5 years.  Lung Cancer Screening: (Low Dose CT Chest recommended if Age 81-80 years, 30 pack-year currently smoking OR have quit w/in 15years.) does not qualify.   Lung Cancer Screening Referral: n/a  Additional Screening:  Hepatitis C Screening: does not qualify;   Vision Screening: Recommended annual ophthalmology exams for early detection of glaucoma and other disorders of the eye. Is the patient up to date with their annual eye exam?  Yes  Who is the provider or what is the name of the office in which the patient attends annual eye exams? Dr.Jaffe  If pt is not established with a provider, would they like to be referred to a provider to establish care? No .   Dental Screening: Recommended annual dental exams for proper oral hygiene  Community Resource Referral / Chronic Care Management: CRR required this visit?  No   CCM required this visit?  No      Plan:     I have personally reviewed and noted the following in the patient's chart:   Medical and social history Use of alcohol, tobacco or illicit drugs  Current medications and supplements including opioid prescriptions.  Functional ability and status Nutritional status Physical activity Advanced directives List of other physicians Hospitalizations, surgeries, and ER visits in previous 12 months Vitals Screenings to include cognitive, depression, and falls Referrals and appointments  In addition, I have reviewed and discussed with patient certain preventive  protocols, quality metrics, and best practice recommendations. A written personalized care plan for preventive services as well as general preventive health recommendations  were provided to patient.     Randel Pigg, LPN   [id number]   Nurse Notes: none   Medical screening examination/treatment/procedure(s) were performed by non-physician practitioner and as supervising physician I was immediately available for consultation/collaboration.  I agree with above. Lew Dawes, MD

## 2021-04-01 ENCOUNTER — Telehealth: Payer: Self-pay | Admitting: Internal Medicine

## 2021-04-01 NOTE — Telephone Encounter (Signed)
Patient is requesting advise on the best magnesium to buy. She forgot to discuss it at her last appointment with Mendel Ryder.   Please advise

## 2021-04-01 NOTE — Patient Instructions (Signed)
Visit Information  Phone number for Pharmacist: [phone]  Thank you for meeting with me to discuss your medications! I look forward to working with you to achieve your health care goals. Below is a summary of what we talked about during the visit:   Goals Addressed             This Visit's Progress    Prevent Falls and Broken Bones-Osteoporosis       Timeframe:  Long-Range Goal Priority:  High Start Date:      03/25/21                       Expected End Date:   03/25/22                   Follow Up Date May 2023   - always use handrails on the stairs - always wear shoes or slippers with non-slip sole - get at least 10 minutes of activity every day - make an emergency alert plan in case I fall - pick up clutter from the floors - use a nightlight in the bathroom    Why is this important?   When you fall, there are 3 things that control if a bone breaks or not.  These are the fall itself, how hard and the direction that you fall and how fragile your bones are.  Preventing falls is very important for you because of fragile bones.     Notes:         Care Plan : [place]  Updates made by Charlton Haws, [place] since 04/01/2021 12:00 AM     Problem: Hyperlipidemia, GERD, and Osteoporosis, Uveitis, Memory difficulties   Priority: High     Long-Range Goal: Disease management   Start Date: 04/01/2021  Expected End Date: 04/01/2022  This Visit's Progress: On track  Priority: High  Note:   Current Barriers:  Unable to independently monitor therapeutic efficacy Suboptimal therapeutic regimen for Osteoporosis  Pharmacist Clinical Goal(s):  Patient will achieve adherence to monitoring guidelines and medication adherence to achieve therapeutic efficacy adhere to plan to optimize therapeutic regimen for osteoporosis as evidenced by report of adherence to recommended medication management changes through collaboration with PharmD and provider.    Interventions: 1:1 collaboration with Plotnikov, Evie Lacks, MD regarding development and update of comprehensive plan of care as evidenced by provider attestation and co-signature Inter-disciplinary care team collaboration (see longitudinal plan of care) Comprehensive medication review performed; medication list updated in electronic medical record  Hyperlipidemia: (LDL goal < 130) -Controlled -Coronary CT 10/2020: Calcium score 0, no evidence of CAD; statin therapy is not indicated -Current treatment: Aspirin 81 mg daily -Current exercise habits: pt works out at fitness center 3x a week -Educated on Cholesterol goals; Importance of limiting foods high in cholesterol; Exercise goal of 150 minutes per week; -Counseled on diet and exercise extensively  Osteoporosis (Goal prevent fractures) -Uncontrolled - pt is open to starting pharmacologic therapy -Last DEXA Scan: 07/04/19   T-Score femoral neck: -2.8  T-Score lumbar spine: -0.9 -Patient is a candidate for pharmacologic treatment due to T-Score < -2.5 in femoral neck -Current treatment  Calcium-Vitamin D 500-200 mg-IU BID -Medications previously tried: fosamax (~2012)  -Recommend weight-bearing and muscle strengthening exercises for building and maintaining bone density. -Counseled on pharmacologic options - bisphosphonates vs Prolia; pt is open to starting Prolia if insurance covers it -Recommend insurance benefit investigation for Prolia - consulting with PCP  Memory  difficulties (Goal: prevent progression) -Controlled -Current treatment  Donepezil 10 mg - 1/2 tab BID -Counseled on benefits of donepezil for delayed progression of impairment, not improvement in memory -Recommended to continue current medication  Uveitis (Goal: manage symptoms) -Controlled - pt follows regularly eye specialist; she is not currently taking folic acid -Current treatment  Methotrexate 2.5 mg x 8 tab once weekly (20 mg) Folic acid 1 mg daily - not  taking Simbrinza eye drops Prednisolone 1% eye drops -Counseled on importance of folic acid supplementation while taking methotrexate; advised to restart folic acid 1 mg daily  GERD (Goal: manage symptoms) -Controlled -Current treatment  Famotidine 40 mg daily PRN -Recommended to continue current medication  Health Maintenance -Vaccine gaps: Shingrix -Current therapy:  Multivitamin Ocuvite Vitamin B complex Glucosamine-chondroitin -Pt complains of muscle cramps on near-daily basis; discussed potential causes including dehydration and/or electrolyte deficiencies; pt admits she does not drink enough fluids; advised to increase fluid intake ~64 oz per day -Recommended to continue current medication  Patient Goals/Self-Care Activities Patient will:  - take medications as prescribed as evidenced by patient report and record review focus on medication adherence by routine target a minimum of 150 minutes of moderate intensity exercise weekly engage in dietary modifications by increasing fluid intake       Ms. Rauth was given information about Chronic Care Management services today including:  CCM service includes personalized support from designated clinical staff supervised by her physician, including individualized plan of care and coordination with other care providers 24/7 contact phone numbers for assistance for urgent and routine care needs. Standard insurance, coinsurance, copays and deductibles apply for chronic care management only during months in which we provide at least 20 minutes of these services. Most insurances cover these services at 100%, however patients may be responsible for any copay, coinsurance and/or deductible if applicable. This service may help you avoid the need for more expensive face-to-face services. Only one practitioner may furnish and bill the service in a calendar month. The patient may stop CCM services at any time (effective at the end of the  month) by phone call to the office staff.  Patient agreed to services and verbal consent obtained.   Patient verbalizes understanding of instructions provided today and agrees to view in [place].  Telephone follow up appointment with pharmacy team member scheduled for: 6 months  Charlene Brooke, PharmD, Para [place], CPP Clinical Pharmacist Practitioner [facility] [phone]

## 2021-04-01 NOTE — Chronic Care Management (AMB) (Signed)
Spoke with patient  Had a question between 2 supplements that she has been taking   Nature's bounty magnesium glycinate 200mg  - 2 tablets daily   Naturemade Super B complex - 1 tablet daily   - Patient reports that she was wanting to start a supplement for leg cramps she has been dealing with  Recommended for patient to start magnesium glycinate 200mg  - 2 tablets daily   Patient has already restarted folic acid supplementation as recommended with last CCM visit, does not need to continue vitamin b complex  Patient will call back with any other issues or concerns   Tomasa Blase, PharmD Clinical Pharmacist, [place]

## 2021-04-10 ENCOUNTER — Ambulatory Visit: Payer: Medicare PPO

## 2021-04-10 DIAGNOSIS — E785 Hyperlipidemia, unspecified: Secondary | ICD-10-CM

## 2021-04-10 DIAGNOSIS — M810 Age-related osteoporosis without current pathological fracture: Secondary | ICD-10-CM

## 2021-04-19 ENCOUNTER — Emergency Department (HOSPITAL_BASED_OUTPATIENT_CLINIC_OR_DEPARTMENT_OTHER)
Admission: EM | Admit: 2021-04-19 | Discharge: 2021-04-19 | Disposition: A | Discharge status: Home / Self Care | Payer: Medicare PPO | Attending: Emergency Medicine | Admitting: Emergency Medicine

## 2021-04-19 ENCOUNTER — Other Ambulatory Visit: Payer: Self-pay

## 2021-04-19 ENCOUNTER — Encounter (HOSPITAL_BASED_OUTPATIENT_CLINIC_OR_DEPARTMENT_OTHER): Payer: Self-pay | Admitting: *Deleted

## 2021-04-19 DIAGNOSIS — J3489 Other specified disorders of nose and nasal sinuses: Secondary | ICD-10-CM | POA: Diagnosis present

## 2021-04-19 DIAGNOSIS — Z7982 Long term (current) use of aspirin: Secondary | ICD-10-CM | POA: Diagnosis not present

## 2021-04-19 DIAGNOSIS — U071 COVID-19: Secondary | ICD-10-CM | POA: Diagnosis not present

## 2021-04-19 MED ORDER — NIRMATRELVIR/RITONAVIR (PAXLOVID)TABLET
3.0000 | ORAL_TABLET | Freq: Two times a day (BID) | ORAL | 0 refills | Status: AC
Start: 2021-04-19 — End: 2021-04-24

## 2021-04-19 NOTE — ED Triage Notes (Signed)
Pt tested positive for covid, requesting medicine for positive results.

## 2021-04-19 NOTE — ED Provider Notes (Signed)
[facility] EMERGENCY DEPT Provider Note   CSN: [id number] Arrival date & time: 04/19/21  0102     History Chief Complaint  Patient presents with   Covid Positive    Wendy Serrano is a 75 y.o. female.  75 yo F with a chief complaints of runny nose this been going on just this morning.  She has been keeping a very close eye on her health because her husband has had COVID and he is currently finishing up his therapy of oral medications for this.  She tested herself this morning and it was positive and so came here for COVID treatment.  She denies any difficulty breathing denies abdominal pain denies nausea or vomiting.  The history is provided by the patient.  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   Menopausal symptoms    Renal cyst    bilateral   Rotator cuff tear    Yeast infection     Patient Active Problem List   Diagnosis Date Noted   Neuralgia involving scalp 03/28/2021   Headache 03/12/2021   Ear canal abrasion, right, initial encounter 03/01/2021   GERD (gastroesophageal reflux disease) 01/24/2021   Leg cramps 01/24/2021   Throat discomfort 12/19/2020   Atherosclerosis of aorta ([place]) 10/11/2020   Foot  pain, bilateral 05/14/2020   Sleep pattern disturbance 05/11/2020   Neck pain 05/11/2020   Cervicogenic headache 09/21/2019   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   ALLERGIC RHINITIS 05/12/2007   HERPES SIMPLEX, UNCOMPLICATED 72/53/6644    Past Surgical History:  Procedure Laterality Date   ROTATOR CUFF REPAIR Left 08/2009   Left shoulder-rotator cuff repair [facility])   WISDOM TOOTH EXTRACTION       OB History     Gravida  0   Para  0   Term  0   Preterm  0   AB  0   Living  0      SAB  0   IAB  0   Ectopic  0   Multiple  0  Live Births              Family History  Problem Relation Age of Onset   Prostate cancer Father        died   Pancreatic cancer Mother        died   Diabetes Mother    Hyperlipidemia Sister     Social History   Tobacco Use   Smoking status: Never   Smokeless tobacco: Never  Substance Use Topics   Alcohol use: No   Drug use: No    Home Medications Prior to Admission medications   Medication Sig Start Date End Date Taking? Authorizing Provider  nirmatrelvir/ritonavir EUA (PAXLOVID) 20 x 150 MG & 10 x 100MG  TABS Take 3 tablets by mouth 2 (two) times daily for 5 days. Take nirmatrelvir (150 mg) two tablets twice daily for 5 days and ritonavir (100 mg) one tablet twice daily for 5 days. 04/19/21 04/24/21 Yes Deno Etienne, DO  aspirin 81  MG chewable tablet Chew 81 mg by mouth daily.    [provider]  b complex vitamins tablet Take 1 tablet by mouth daily. 06/16/19   Plotnikov, Evie Lacks, MD  calcium-vitamin D (OSCAL WITH D) 500-200 MG-UNIT per tablet Take 1 tablet by mouth 2 (two) times daily.    [provider]  donepezil (ARICEPT) 10 MG tablet Take 1 tablet (10 mg total) by mouth at bedtime. 1 tablet at bedtime 03/12/21   Plotnikov, Evie Lacks, MD  FOLIC ACID PO Take 4 tablets by mouth daily. Patient not taking: Reported on 04/01/2021    [provider]  gabapentin (NEURONTIN) 100 MG capsule Take 1 capsule (100 mg total) by mouth at bedtime. 03/28/21   Dohmeier, Asencion Partridge, MD  glucosamine-chondroitin 500-400 MG tablet Take 1 tablet by mouth 2 (two) times daily.    [provider]  methotrexate (RHEUMATREX) 2.5 MG tablet  08/09/15   [provider]  Multiple Vitamin (MULTIVITAMIN) tablet Take 1 tablet by mouth daily.    [provider]  Multiple Vitamins-Minerals (OCUVITE ADULT 50+) CAPS Take 1 capsule by mouth daily.    [provider]  prednisoLONE acetate (PRED FORTE) 1 % ophthalmic suspension Place 1 drop into both eyes 2 (two) times daily.    [provider]  SIMBRINZA 1-0.2 % SUSP Place 1 drop into both eyes 3 (three) times daily. 07/29/15   [provider]    Allergies    Patient has no known allergies.  Review of Systems   Review of Systems  Constitutional:  Negative for chills and fever.  HENT:  Positive for congestion, postnasal drip and rhinorrhea.   Eyes:  Negative for redness and visual disturbance.  Respiratory:  Positive for cough. Negative for shortness of breath and wheezing.   Cardiovascular:  Negative for chest pain and palpitations.  Gastrointestinal:  Negative for nausea and vomiting.  Genitourinary:  Negative for dysuria and urgency.  Musculoskeletal:  Negative for arthralgias and myalgias.  Skin:  Negative for pallor and  wound.  Neurological:  Negative for dizziness and headaches.   Physical Exam Updated Vital Signs BP (!) 151/75 (BP Location: Right Arm)   Pulse 73   Temp 98.2 F (36.8 C)   Resp 14   Ht 5\' 8"  (1.727 m)   Wt 59 kg   SpO2 100%   BMI 19.77 kg/m   Physical Exam Vitals and nursing note reviewed.  Constitutional:      General: She is not in acute distress.  Appearance: She is well-developed. She is not diaphoretic.  HENT:     Head: Normocephalic and atraumatic.     Comments: Swollen turbinates, posterior nasal drip,  Eyes:     Pupils: Pupils are equal, round, and reactive to light.  Cardiovascular:     Rate and Rhythm: Normal rate and regular rhythm.     Heart sounds: No murmur heard.   No friction rub. No gallop.  Pulmonary:     Effort: Pulmonary effort is normal.     Breath sounds: No wheezing or rales.  Abdominal:     General: There is no distension.     Palpations: Abdomen is soft.     Tenderness: There is no abdominal tenderness.  Musculoskeletal:        General: No tenderness.     Cervical back: Normal range of motion and neck supple.  Skin:    General: Skin is warm and dry.  Neurological:     Mental Status: She is alert and oriented to person, place, and time.  Psychiatric:        Behavior: Behavior normal.    ED Results / Procedures / Treatments   Labs (all labs ordered are listed, but only abnormal results are displayed) Labs Reviewed - No data to display  EKG None  Radiology No results found.  Procedures Procedures   Medications Ordered in ED Medications - No data to display  ED Course  I have reviewed the triage vital signs and the nursing notes.  Pertinent labs & imaging results that were available during my care of the patient were reviewed by me and considered in my medical decision making (see chart for details).    MDM Rules/Calculators/A&P                           75 yo F with a chief complaints of runny nose.  This was noticed  this morning.  The patient was worried that she might get COVID from her husband and started having symptoms this morning.  She did a home test that was positive and is here for treatment.  I discussed with her the limitations of the known research for the oral therapies for COVID.  She understands and I will prescribe it for her.  Last had a renal function done less than a month ago that was normal.  She has no obvious medication interactions on my review of her medicines.  PCP follow-up.  11:42 AM:  I have discussed the diagnosis/risks/treatment options with the patient and believe the pt to be eligible for discharge home to follow-up with PCP. We also discussed returning to the ED immediately if new or worsening sx occur. We discussed the sx which are most concerning (e.g., sudden worsening pain, fever, inability to tolerate by mouth) that necessitate immediate return. Medications administered to the patient during their visit and any new prescriptions provided to the patient are listed below.  Medications given during this visit Medications - No data to display   The patient appears reasonably screen and/or stabilized for discharge and I doubt any other medical condition or other [facility] requiring further screening, evaluation, or treatment in the ED at this time prior to discharge.   Final Clinical Impression(s) / ED Diagnoses Final diagnoses:  COVID-19 virus infection    Rx / DC Orders ED Discharge Orders          Ordered    nirmatrelvir/ritonavir EUA (PAXLOVID) 20 x 150 MG &  10 x 100MG  TABS  2 times daily        04/19/21 [place], [place], [place] 04/19/21 1142

## 2021-04-19 NOTE — Discharge Instructions (Signed)
Take tylenol 2 pills 4 times a day and motrin 4 pills 3 times a day.  Drink plenty of fluids.  Return for worsening shortness of breath, headache, confusion. Follow up with your family doctor.   

## 2021-04-19 NOTE — ED Notes (Signed)
Pt discharged to home. Discharge instructions have been discussed with patient and/or family members. Pt verbally acknowledges understanding d/c instructions, and endorses comprehension to checkout at registration before leaving.  °

## 2021-04-29 ENCOUNTER — Other Ambulatory Visit: Payer: Self-pay

## 2021-04-29 ENCOUNTER — Ambulatory Visit: Payer: Medicare PPO | Admitting: Neurology

## 2021-04-29 ENCOUNTER — Encounter: Payer: Self-pay | Admitting: Neurology

## 2021-04-29 ENCOUNTER — Ambulatory Visit: Payer: Medicare PPO | Admitting: Internal Medicine

## 2021-04-29 ENCOUNTER — Encounter: Payer: Self-pay | Admitting: Internal Medicine

## 2021-04-29 VITALS — BP 114/64 | HR 63 | Temp 98.1°F | Ht 68.0 in | Wt 131.2 lb

## 2021-04-29 VITALS — BP 142/70 | HR 75 | Ht 68.0 in | Wt 131.5 lb

## 2021-04-29 DIAGNOSIS — R208 Other disturbances of skin sensation: Secondary | ICD-10-CM

## 2021-04-29 DIAGNOSIS — F09 Unspecified mental disorder due to known physiological condition: Secondary | ICD-10-CM

## 2021-04-29 DIAGNOSIS — R42 Dizziness and giddiness: Secondary | ICD-10-CM

## 2021-04-29 DIAGNOSIS — Z8616 Personal history of COVID-19: Secondary | ICD-10-CM | POA: Insufficient documentation

## 2021-04-29 DIAGNOSIS — U071 COVID-19: Secondary | ICD-10-CM

## 2021-04-29 MED ORDER — GABAPENTIN 100 MG PO CAPS: 200.0000 mg | ORAL_CAPSULE | Freq: Every day | ORAL | 2 refills | Status: DC

## 2021-04-29 NOTE — Progress Notes (Signed)
Contacted pt, she stated she has been having some discomfort on L side of head, also having touch sensitivity and wants to see Dr Brett Fairy. Please make appt  .      Provider:  Larey Seat, M D  Referring Provider: [name], MD Primary Care Physician:  [name], MD  Chief Complaint  Patient presents with   Follow-up    Rm 10, w husband. Pt reports doing well. Pt continues to have sensitivity on left side of head this morning. Periodically has discomfort. Pt reports memory has been better since last visit w Dr. Jannifer Franklin. Pt taking Aricept 10mg  nightly and tolerating well.     HPI:  04-29-2021;  Wendy Serrano is a 75 y.o.  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 Jannifer Franklin, who has retired. She is a primary patient of Dr Alain Marion . Followed at [facility] for cognitive disorder, tinnitus, had 2 recent visits in ENT, PCP and last night ER-  Area of dyseasthesias , coin sized, crown of the head.  Gabapentin decreased sensation and she sleeps well. Tinnitus not bothering now.   The patient's husband felt that she also has some memory impairment today she was tested by Mini-Mental status exam and she scored only 22 out of 30 points which is concerning.  The only orientation question she missed was the date but she was clear about the day of the week month year or season.  She knew fully which places and location she is at, she had trouble with the serial sevens and missed step 3. She was able to spell backwards- I gave her 5 points, she was able to copy an image and draw a clock face.           HPI Wendy Serrano presents for C/o "frying sounds" in the L ear x 2 weeks - better. C/o sensation on the L head - hypersensitive to touch. She went to ED last night. Has difficulties to explain this sensation- a bubbling or soda pop feeling under the scalp, no numbness.  ENT appt did not  bring her closer to a solution, the physician felt it was not tinnitus related.   She reports no recent changes in medications she sees a p specialist for ophthalmology at [facility], she has macular degeneration and a new vascularization of the left  eyes, lower outer quadrant of the left eye. She uses eye drops.   She has alopecia, a hereditary trait, 2002.       Review of Systems: Out of a complete 14 system review, the patient complains of only the following symptoms, and all other reviewed systems are negative.  College graduate-   mini-mental status exam: 25 / 30 points .   World - 5 points , drawing 1 points. Immediate recall was 1 out of 3 words.  Missed date .     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 They are doing well   Marriage is in good health and retirement is goodEnd-of-life: provided packet of information and forms for consideration (Oct'11)      Lives with husband   Right handed   Caffeine: zero   Social Determinants of Health   Financial Resource Strain: Low Risk    Difficulty of Paying Living Expenses: Not hard at all  Food Insecurity: No Food Insecurity   Worried About Charity fundraiser in the Last Year: Never true   [place] in the Last Year: Never true  Transportation Needs: No Transportation Needs   Lack of  Transportation (Medical): No   Lack of Transportation (Non-Medical): No  Physical Activity: Insufficiently Active   Days of Exercise per Week: 3 days   Minutes of Exercise per Session: 30 min  Stress: No Stress Concern Present   Feeling of Stress : Not at all  Social Connections: Socially Integrated   Frequency of Communication with Friends and Family: Twice a week   Frequency of Social Gatherings with Friends and Family: Twice a week   Attends Religious Services: More than 4 times per year   Active Member of [employer] or Organizations: Yes   Attends Archivist Meetings: 1 to 4 times per year   Marital Status: Married  Human resources officer Violence: Not At Risk   Fear of Current or Ex-Partner: No   Emotionally Abused: No   Physically Abused: No   Sexually Abused: No    Family History  Problem Relation Age of Onset   Prostate cancer Father        died   Pancreatic cancer Mother        died   Diabetes Mother    Hyperlipidemia Sister     Past Medical History:  Diagnosis Date   Allergic rhinitis    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   Menopausal symptoms    Renal cyst    bilateral   Rotator cuff tear    Yeast infection     Past Surgical History:  Procedure Laterality Date   ROTATOR CUFF REPAIR Left 08/2009   Left shoulder-rotator cuff repair [facility])   WISDOM TOOTH EXTRACTION      Current Outpatient Medications  Medication Sig Dispense Refill   aspirin 81 MG chewable tablet Chew 81 mg by mouth daily.     b complex vitamins tablet Take 1 tablet by mouth daily. 100 tablet 3   calcium-vitamin D (OSCAL WITH D)  500-200 MG-UNIT per tablet Take 1 tablet by mouth 2 (two) times daily.     donepezil (ARICEPT) 10 MG tablet Take 1 tablet (10 mg total) by mouth at bedtime. 1 tablet at bedtime 90 tablet 3   FOLIC ACID PO Take 4 tablets by mouth daily.     gabapentin (NEURONTIN) 100 MG capsule Take 1 capsule (  100 mg total) by mouth at bedtime. 90 capsule 2   glucosamine-chondroitin 500-400 MG tablet Take 1 tablet by mouth 2 (two) times daily.     methotrexate (RHEUMATREX) 2.5 MG tablet   3   Multiple Vitamin (MULTIVITAMIN) tablet Take 1 tablet by mouth daily.     Multiple Vitamins-Minerals (OCUVITE ADULT 50+) CAPS Take 1 capsule by mouth daily.     prednisoLONE acetate (PRED FORTE) 1 % ophthalmic suspension Place 1 drop into both eyes 2 (two) times daily.     SIMBRINZA 1-0.2 % SUSP Place 1 drop into both eyes 3 (three) times daily.  6   No current facility-administered medications for this visit.    Allergies as of 04/29/2021   (No Known Allergies)   CT HEAD WITHOUT CONTRAST   TECHNIQUE: Contiguous axial images were obtained from the base of the skull through the vertex without intravenous contrast.   COMPARISON:  02/11/2014   FINDINGS: Brain: There is no mass, hemorrhage or extra-axial collection. There is generalized atrophy without lobar predilection. Hypodensity of the white matter is most commonly associated with chronic microvascular disease.   Vascular: No abnormal hyperdensity of the major intracranial arteries or dural venous sinuses. No intracranial atherosclerosis.   Skull: The visualized skull base, calvarium and extracranial soft tissues are normal.   Sinuses/Orbits: No fluid levels or advanced mucosal thickening of the visualized paranasal sinuses. No mastoid or middle ear effusion. The orbits are normal.   IMPRESSION: Generalized atrophy and chronic microvascular ischemia without acute intracranial abnormality.     Electronically Signed   By: Ulyses Jarred M.D.   On:  03/28/2021 03:42  IMPRESSION: This MRI of the brain without contrast shows the following: 1.   Mild generalized cortical atrophy, probably normal for age and unchanged compared to the 2019 MRI. 2.   Scattered T2/FLAIR hyperintense single and confluent foci predominantly in the hemispheres and a few foci in the pons and cerebellum.  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  None of the foci appear to be acute and there is no progression compared to the 2019 MRI. 3.   No acute findings.       INTERPRETING PHYSICIAN:  Richard A. Felecia Shelling, MD, PhD, Charlynn Grimes Vitals: BP (!) 142/70    Pulse 75    Ht 5\' 8"  (1.727 m)    Wt 131 lb 8 oz (59.6 kg)    BMI 19.99 kg/m  Last Weight:  Wt Readings from Last 1 Encounters:  04/29/21 131 lb 8 oz (59.6 kg)   Last Height:   Ht Readings from Last 1 Encounters:  04/29/21 5\' 8"  (1.727 m)    Physical exam:  General: The patient is awake, alert and appears not in acute distress. The patient is well groomed. Head: Normocephalic, atraumatic. Neck is supple.  Cardiovascular:  Regular rate and rhythm, without  murmurs or carotid bruit, and without distended neck veins. Respiratory: Lungs are clear to auscultation. Skin:  Without evidence of edema, or rash Trunk: BMI is 19.99  -  Neurologic exam : The patient is awake and alert, oriented to place and time.  Memory subjective  described as impaired   mini-mental status exam mini-mental status exam    There is a normal attention span & concentration ability. Speech is fluent without  dysarthria, dysphonia or aphasia. Mood and affect are appropriate.  Cranial nerves: Pupils are equal and briskly reactive to light. Funduscopic exam with evidence of pallor. Extraocular movements  in vertical and horizontal  planes intact and without nystagmus.  Visual fields by finger perimetry are intact. Hearing to finger rub intact.   Facial sensation intact to fine touch.  Testing by Q-tip  revealed the same sensation for fine touch at the cheekbone, forehead, eyebrow region, temple region and retroauricular.  I also tested the Weber transmission and there is no impairment for air or bone-conduction. Facial motor strength is symmetric and tongue and uvula move midline. Tongue protrusion into either cheek is normal. Shoulder shrug is normal.   Motor exam:   Normal tone ,muscle bulk and symmetric  strength in all extremities.  Sensory:  Fine touch, pinprick and vibration were tested in all extremities. Proprioception was normal.  Coordination: Rapid alternating movements in the fingers/hands were normal. Finger-to-nose maneuver  normal without evidence of ataxia, dysmetria or tremor.  Gait and station: Patient walks without assistive device  Proximal and distal strength within normal limits. Stance is stable.   Tandem gait is unfragmented.  Romberg testing is negative   Deep tendon reflexes: in the  upper and lower extremities are symmetric and intact. Babinski maneuver response is downgoing.   Assessment:  After physical and neurologic examination, review of laboratory studies, imaging, neurophysiology testing and pre-existing records, assessment is that of :  She made me aware she sleeps well, her Apetite has not changed; a short sleeper- she gets to bed at 4.30 Am and gets up at 10.30- always has been a night owl, ever since college.  She had Covid at Easter 2021, and just sailed through. She just got infected again, 04-20-2021, now tested negative. No major symptoms, like a head cold. Went to ED and got the po medication and recovered quickly. .   Plan:  Treatment plan and additional workup :  I had reccommended something in the gabapentin family to reduce the sensation and she tolerated 100 mg at bedtime gabapentin very well, but is hesitant to take it every day- I like for her to take it avery night, and just take a second  if needed. .   I would like to increase to 200  since she finds some relief at 100 mg.  We will not use it in daytime as I feel that it would make her too sleepy and I am  happy to increase by 100s if tolerated until she has the best possible relief. The are is coin sized.   Gabapentin 100 mg take 2 (  200 mg) .   Encourage mental exercise, crossword puzzles, soduko, card games, chess, [place]. .  She takes lion-mane mushroom for memory support.   I like for her to continue aricept.10 mg - test MMSE again in 4 months. if she drops scores will start namenda.   Methotrexate to continue with [facility] specialist, 7 tablets MTX on Saturadays only. Keep takig folic acid.    Rv in 3- 4 months with  NP Sl[facility] .   Asencion Partridge Somalia Segler MD 04/29/2021

## 2021-04-29 NOTE — Patient Instructions (Signed)
Memory Compensation Strategies  Use "WARM" strategy.  W= write it down  A= associate it  R= repeat it  M= make a mental note  2.   You can keep a Memory Notebook.  Use a 3-ring notebook with sections for the following: calendar, important names and phone numbers,  medications, doctors' names/phone numbers, lists/reminders, and a section to journal what you did  each day.   3.    Use a calendar to write appointments down.  4.    Write yourself a schedule for the day.  This can be placed on the calendar or in a separate section of the Memory Notebook.  Keeping a  regular schedule can help memory.  5.    Use medication organizer with sections for each day or morning/evening pills.  You may need help loading it  6.    Keep a basket, or pegboard by the door.  Place items that you need to take out with you in the basket or on the pegboard.  You may also want to  include a message board for reminders.  7.    Use sticky notes.  Place sticky notes with reminders in a place where the task is performed.  For example: " turn off the  stove" placed by the stove, "lock the door" placed on the door at eye level, " take your medications" on  the bathroom mirror or by the place where you normally take your medications.  8.    Use alarms/timers.  Use while cooking to remind yourself to check on food or as a reminder to take your medicine, or as a  reminder to make a call, or as a reminder to perform another task, etc. Management of Memory Problems  There are some general things you can do to help manage your memory problems.  Your memory may not in fact recover, but by using techniques and strategies you will be able to manage your memory difficulties better.  1)  Establish a routine. Try to establish and then stick to a regular routine.  By doing this, you will get used to what to expect and you will reduce the need to rely on your memory.  Also, try to do things at the same time of day, such as  taking your medication or checking your calendar first thing in the morning. Think about think that you can do as a part of a regular routine and make a list.  Then enter them into a daily planner to remind you.  This will help you establish a routine.  2)  Organize your environment. Organize your environment so that it is uncluttered.  Decrease visual stimulation.  Place everyday items such as keys or cell phone in the same place every day (ie.  Basket next to front door) Use post it notes with a brief message to yourself (ie. Turn off light, lock the door) Use labels to indicate where things go (ie. Which cupboards are for food, dishes, etc.) Keep a notepad and pen by the telephone to take messages  3)  Memory Aids A diary or journal/notebook/daily planner Making a list (shopping list, chore list, to do list that needs to be done) Using an alarm as a reminder (kitchen timer or cell phone alarm) Using cell phone to store information (Notes, Calendar, Reminders) Calendar/White board placed in a prominent position Post-it notes  In order for memory aids to be useful, you need to have good habits.  It's no good remembering   to make a note in your journal if you don't remember to look in it.  Try setting aside a certain time of day to look in journal.  4)  Improving mood and managing fatigue. There may be other factors that contribute to memory difficulties.  Factors, such as anxiety, depression and tiredness can affect memory. Regular gentle exercise can help improve your mood and give you more energy. Simple relaxation techniques may help relieve symptoms of anxiety Try to get back to completing activities or hobbies you enjoyed doing in the past. Learn to pace yourself through activities to decrease fatigue. Find out about some local support groups where you can share experiences with others. Try and achieve 7-8 hours of sleep at night. 

## 2021-04-29 NOTE — Progress Notes (Signed)
Patient ID: Wendy Serrano, female   DOB: [birth year], 75 y.o.   MRN: [id number]        Chief Complaint: follow up recent covid infection       HPI:  Wendy Serrano is a 75 y.o. female here with husband who gives hx; husband initially with infection, then pt with similar mild URI symptoms primarily now s/p   Paxlovid course and doing well with essentially all symptoms resolved.  Pt denies chest pain, increased sob or doe, wheezing, orthopnea, PND, increased LE swelling, palpitations,  or syncope, but did have mild veritigo recur last wk now improved again.   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 She has never used smokeless tobacco. She reports that she does not drink alcohol and does not use drugs. family history includes Diabetes in her mother; Hyperlipidemia in her sister;  Pancreatic cancer in her mother; Prostate cancer in her father. No Known Allergies Current Outpatient Medications on File Prior to Visit  Medication Sig Dispense Refill   aspirin 81 MG chewable tablet Chew 81 mg by mouth daily.     b complex vitamins tablet Take 1 tablet by mouth daily. 100 tablet 3   calcium-vitamin D (OSCAL WITH D) 500-200 MG-UNIT per tablet Take 1 tablet by mouth 2 (two) times daily.     donepezil (ARICEPT) 10 MG tablet Take 1 tablet (10 mg total) by mouth at bedtime. 1 tablet at bedtime 90 tablet 3   FOLIC ACID PO Take 4 tablets by mouth daily.     glucosamine-chondroitin 500-400 MG tablet Take 1 tablet by mouth 2 (two) times daily.     methotrexate (RHEUMATREX) 2.5 MG tablet   3   Multiple Vitamin (MULTIVITAMIN) tablet Take 1 tablet by mouth daily.     Multiple Vitamins-Minerals (OCUVITE ADULT 50+) CAPS Take 1 capsule by mouth daily.     prednisoLONE acetate (PRED FORTE) 1 % ophthalmic suspension Place 1 drop into both eyes 2 (two) times daily.     SIMBRINZA 1-0.2 % SUSP Place 1 drop into both eyes 3 (three) times daily.  6   No current facility-administered  medications on file prior to visit.        ROS:  All others reviewed and negative.  Objective        PE:  BP 114/64 (BP Location: Right Arm, Patient Position: Sitting, Cuff Size: Normal)    Pulse 63    Temp 98.1 F (36.7 C) (Oral)    Ht 5\' 8"  (1.727 m)    Wt 131 lb 3.2 oz (59.5 kg)    SpO2 98%    BMI 19.95 kg/m                 Constitutional: Pt appears in NAD               HENT: Head: NCAT.                Right Ear: External ear normal.                 Left Ear: External ear normal.                Eyes: . Pupils are equal, round, and reactive to light. Conjunctivae and EOM are normal               Nose: without d/c or deformity               Neck: Neck supple. Gross normal ROM               Cardiovascular: Normal rate and regular rhythm.                 Pulmonary/Chest: Effort normal and breath sounds  without rales or wheezing.                Abd:  Soft, NT, ND, + BS, no organomegaly               Neurological: Pt is alert. At baseline orientation, motor grossly intact               Skin: Skin is warm. No rashes, no other new lesions, LE edema - none               Psychiatric: Pt behavior is normal without agitation   Micro: none  Cardiac tracings I have personally interpreted today:  none  Pertinent Radiological findings (summarize): none   Lab Results  Component Value Date   WBC 3.9 (L) 08/22/2020   HGB 13.4 08/22/2020   HCT 39.8 08/22/2020   PLT 290.0 08/22/2020   GLUCOSE 92 08/22/2020   CHOL 208 (H) 08/22/2020   TRIG 51.0 08/22/2020   HDL 86.40 08/22/2020   LDLDIRECT 127.9 03/09/2013   LDLCALC 112 (H) 08/22/2020   ALT 19 08/22/2020   AST 22 08/22/2020   NA 141 08/22/2020   K 3.8 08/22/2020   CL 106 08/22/2020   CREATININE 0.85 08/22/2020   BUN 19 08/22/2020   CO2 29 08/22/2020   TSH 5.41 (H) 08/22/2020   INR 1.1 (H) 05/16/2015   HGBA1C 5.4 02/26/2011   Assessment/Plan:  Wendy Serrano is a 75 y.o. Black or African American [2] female with  has a past medical history of Allergic rhinitis, Atrophy of vagina (07/15/10), C. difficile diarrhea (06/2017), Cervicogenic headache (09/21/2019), Eczema, H/O osteopenia (01/09/04), H/O osteoporosis (2008), H/O varicella, Herpes simplex without mention of complication, History of measles, mumps, or rubella, Leukopenia, Liver hemangioma, Macular degeneration (oct '11), Menopausal symptoms, Renal cyst, Rotator cuff tear, and Yeast infection.  Vertigo Transient now improved, decliens further tx,  to f/u any worsening symptoms or concerns   Mild cognitive disorder Chronic stable no change per husband,  to f/u any worsening symptoms or concerns, declines change in tx  COVID-19 virus infection With URI symptoms primarily now essentialy resolved,  to f/u any worsening symptoms or concerns  Followup: Return if symptoms worsen or fail  to improve.  Cathlean Cower, MD 04/29/2021 10:52 PM [facility]

## 2021-04-29 NOTE — Assessment & Plan Note (Signed)
With URI symptoms primarily now essentialy resolved,  to f/u any worsening symptoms or concerns

## 2021-04-29 NOTE — Patient Instructions (Signed)
Please continue all other medications as before, and refills have been done if requested.  Please have the pharmacy call with any other refills you may need.  Please continue your efforts at being more active, low cholesterol diet  Please keep your appointments with your specialists as you may have planned

## 2021-04-29 NOTE — Assessment & Plan Note (Signed)
Transient now improved, decliens further tx,  to f/u any worsening symptoms or concerns

## 2021-04-29 NOTE — Assessment & Plan Note (Signed)
Chronic stable no change per husband,  to f/u any worsening symptoms or concerns, declines change in tx

## 2021-05-01 ENCOUNTER — Ambulatory Visit: Payer: Medicare PPO | Admitting: Internal Medicine

## 2021-05-09 ENCOUNTER — Other Ambulatory Visit: Payer: Self-pay | Admitting: Internal Medicine

## 2021-05-09 ENCOUNTER — Ambulatory Visit: Payer: Medicare PPO | Admitting: Neurology

## 2021-06-05 DIAGNOSIS — H903 Sensorineural hearing loss, bilateral: Secondary | ICD-10-CM | POA: Diagnosis not present

## 2021-06-05 DIAGNOSIS — H9312 Tinnitus, left ear: Secondary | ICD-10-CM | POA: Diagnosis not present

## 2021-06-11 ENCOUNTER — Other Ambulatory Visit: Payer: Self-pay

## 2021-06-11 ENCOUNTER — Ambulatory Visit: Payer: Medicare PPO | Admitting: Internal Medicine

## 2021-06-11 ENCOUNTER — Encounter: Payer: Self-pay | Admitting: Internal Medicine

## 2021-06-11 DIAGNOSIS — R252 Cramp and spasm: Secondary | ICD-10-CM

## 2021-06-11 DIAGNOSIS — R070 Pain in throat: Secondary | ICD-10-CM | POA: Diagnosis not present

## 2021-06-11 DIAGNOSIS — Z8616 Personal history of COVID-19: Secondary | ICD-10-CM

## 2021-06-11 MED ORDER — FAMOTIDINE 40 MG PO TABS: 40.0000 mg | ORAL_TABLET | Freq: Every day | ORAL | 3 refills | Status: DC

## 2021-06-11 NOTE — Assessment & Plan Note (Signed)
Recovered  

## 2021-06-11 NOTE — Progress Notes (Signed)
Subjective:  Patient ID: Wendy Serrano, female    DOB: 08/18/1945  Age: 76 y.o. MRN: [id number]  CC: Leg Pain (Pt states mainly in the mornings )   HPI [name] presents for B leg discomfort in her am Kendrick Fries gets up at 1 pm) x 3-4 weeks. Cramps in the muscles.  It may be after 11 am, she would wake up. They sleep on the adjustable mattress - her feet are down in bed.    Outpatient Medications Prior to Visit  Medication Sig Dispense Refill   aspirin 81 MG chewable tablet Chew 81 mg by mouth daily.     b complex vitamins tablet Take 1 tablet by mouth daily. 100 tablet 3   calcium-vitamin D (OSCAL WITH D) 500-200 MG-UNIT per tablet Take 1 tablet by mouth 2 (two) times daily.     donepezil (ARICEPT) 10 MG tablet Take 1 tablet (10 mg total) by mouth at bedtime. 1 tablet at bedtime 90 tablet 3   FOLIC ACID PO Take 4 tablets by mouth daily.     gabapentin (NEURONTIN) 100 MG capsule Take 2 capsules (200 mg total) by mouth at bedtime. 180 capsule 2   glucosamine-chondroitin 500-400 MG tablet Take 1 tablet by mouth 2 (two) times daily.     methotrexate (RHEUMATREX) 2.5 MG tablet   3   Multiple Vitamin (MULTIVITAMIN) tablet Take 1 tablet by mouth daily.     Multiple Vitamins-Minerals (OCUVITE ADULT 50+) CAPS Take 1 capsule by mouth daily.     pantoprazole (PROTONIX) 40 MG tablet TAKE 1 TABLET BY MOUTH EVERY DAY 90 tablet 1   prednisoLONE acetate (PRED FORTE) 1 % ophthalmic suspension Place 1 drop into both eyes 2 (two) times daily.     SIMBRINZA 1-0.2 % SUSP Place 1 drop into both eyes 3 (three) times daily.  6   No facility-administered medications prior to visit.    ROS: Review of Systems  Constitutional:  Negative for activity change, appetite change, chills, fatigue and unexpected weight change.  HENT:  Negative for congestion, mouth sores and sinus pressure.   Eyes:  Negative for visual disturbance.  Respiratory:  Negative for cough and chest tightness.   Gastrointestinal:   Negative for abdominal pain and nausea.  Genitourinary:  Negative for difficulty urinating, frequency and vaginal pain.  Musculoskeletal:  Positive for arthralgias. Negative for back pain and gait problem.  Skin:  Negative for pallor and rash.  Neurological:  Negative for dizziness, tremors, weakness, numbness and headaches.  Psychiatric/Behavioral:  Negative for confusion and sleep disturbance.    Objective:  BP 132/82 (BP Location: Left Arm)    Pulse 78    Temp 98.7 F (37.1 C) (Oral)    Ht 5\' 8"  (1.727 m)    Wt 133 lb 3.2 oz (60.4 kg)    SpO2 97%    BMI 20.25 kg/m   BP Readings from Last 3 Encounters:  06/11/21 132/82  04/29/21 114/64  04/29/21 (!) 142/70    Wt Readings from Last 3 Encounters:  06/11/21 133 lb 3.2 oz (60.4 kg)  04/29/21 131 lb 3.2 oz (59.5 kg)  04/29/21 131 lb 8 oz (59.6 kg)    Physical Exam Constitutional:      General: She is not in acute distress.    Appearance: She is well-developed.  HENT:     Head: Normocephalic.     Right Ear: External ear normal.     Left Ear: External ear normal.     Nose: Nose normal.  Eyes:     General:        Right eye: No discharge.        Left eye: No discharge.     Conjunctiva/sclera: Conjunctivae normal.     Pupils: Pupils are equal, round, and reactive to light.  Neck:     Thyroid: No thyromegaly.     Vascular: No JVD.     Trachea: No tracheal deviation.  Cardiovascular:     Rate and Rhythm: Normal rate and regular rhythm.     Heart sounds: Normal heart sounds.  Pulmonary:     Effort: No respiratory distress.     Breath sounds: No stridor. No wheezing.  Abdominal:     General: Bowel sounds are normal. There is no distension.     Palpations: Abdomen is soft. There is no mass.     Tenderness: There is no abdominal tenderness. There is no guarding or rebound.  Musculoskeletal:        General: No tenderness.     Cervical back: Normal range of motion and neck supple. No rigidity.  Lymphadenopathy:     Cervical:  No cervical adenopathy.  Skin:    Findings: No erythema or rash.  Neurological:     Mental Status: She is oriented to person, place, and time.     Cranial Nerves: No cranial nerve deficit.     Motor: No abnormal muscle tone.     Coordination: Coordination normal.     Deep Tendon Reflexes: Reflexes normal.  Psychiatric:        Behavior: Behavior normal.        Thought Content: Thought content normal.        Judgment: Judgment normal.    Lab Results  Component Value Date   WBC 3.9 (L) 08/22/2020   HGB 13.4 08/22/2020   HCT 39.8 08/22/2020   PLT 290.0 08/22/2020   GLUCOSE 92 08/22/2020   CHOL 208 (H) 08/22/2020   TRIG 51.0 08/22/2020   HDL 86.40 08/22/2020   LDLDIRECT 127.9 03/09/2013   LDLCALC 112 (H) 08/22/2020   ALT 19 08/22/2020   AST 22 08/22/2020   NA 141 08/22/2020   K 3.8 08/22/2020   CL 106 08/22/2020   CREATININE 0.85 08/22/2020   BUN 19 08/22/2020   CO2 29 08/22/2020   TSH 5.41 (H) 08/22/2020   INR 1.1 (H) 05/16/2015   HGBA1C 5.4 02/26/2011    No results found.  Assessment & Plan:   Problem List Items Addressed This Visit     History of COVID-19    Recovered      Leg cramps    Try to sleep on a regular bed for a couple nights Try Tylenol PM 1-2 tablets at bedtime No RLS sx's. No PAD, DVT signs         No orders of the defined types were placed in this encounter.     Follow-up: Return in about 4 weeks (around 07/09/2021) for a follow-up visit.  Walker Kehr, MD

## 2021-06-11 NOTE — Assessment & Plan Note (Addendum)
B leg discomfort in her am Kendrick Fries gets up at 1 pm) x 3-4 weeks. Cramps in the muscles.  It may be after 11 am, she would wake up. They sleep on the adjustable mattress - her feet are down in bed. ?positional discomfort vs other Try to sleep on a regular bed for a couple nights Try Tylenol PM 1-2 tablets at bedtime No RLS sx's. No PAD, DVT signs

## 2021-06-11 NOTE — Assessment & Plan Note (Signed)
Off Protonix Re-start Pepcid 40 mg/day

## 2021-06-11 NOTE — Patient Instructions (Addendum)
?  positional discomfort vs other  Try to sleep on a regular bed for a couple nights  Try Tylenol PM 1-2 tablets at bedtime  Re-start Pepcid 40 mg/day

## 2021-06-25 ENCOUNTER — Ambulatory Visit: Payer: Medicare PPO | Admitting: Neurology

## 2021-06-26 ENCOUNTER — Other Ambulatory Visit: Payer: Self-pay

## 2021-06-26 ENCOUNTER — Ambulatory Visit: Payer: Medicare PPO | Admitting: Internal Medicine

## 2021-06-26 ENCOUNTER — Other Ambulatory Visit: Payer: Self-pay | Admitting: Internal Medicine

## 2021-06-26 VITALS — BP 136/70 | HR 77 | Temp 98.4°F | Ht 68.0 in | Wt 133.1 lb

## 2021-06-26 DIAGNOSIS — R683 Clubbing of fingers: Secondary | ICD-10-CM | POA: Diagnosis not present

## 2021-06-26 DIAGNOSIS — R7989 Other specified abnormal findings of blood chemistry: Secondary | ICD-10-CM

## 2021-06-26 DIAGNOSIS — R030 Elevated blood-pressure reading, without diagnosis of hypertension: Secondary | ICD-10-CM | POA: Diagnosis not present

## 2021-06-26 DIAGNOSIS — R413 Other amnesia: Secondary | ICD-10-CM

## 2021-06-26 DIAGNOSIS — R5383 Other fatigue: Secondary | ICD-10-CM | POA: Diagnosis not present

## 2021-06-26 DIAGNOSIS — E559 Vitamin D deficiency, unspecified: Secondary | ICD-10-CM

## 2021-06-26 DIAGNOSIS — E538 Deficiency of other specified B group vitamins: Secondary | ICD-10-CM

## 2021-06-26 LAB — BASIC METABOLIC PANEL
BUN: 16 mg/dL (ref 6–23)
CO2: 29 mEq/L (ref 19–32)
Calcium: 9.9 mg/dL (ref 8.4–10.5)
Chloride: 106 mEq/L (ref 96–112)
Creatinine, Ser: 0.83 mg/dL (ref 0.40–1.20)
GFR: 68.27 mL/min (ref >60.00)
Glucose, Bld: 78 mg/dL (ref 70–99)
Potassium: 3.7 mEq/L (ref 3.5–5.1)
Sodium: 143 mEq/L (ref 135–145)

## 2021-06-26 LAB — CBC WITH DIFFERENTIAL/PLATELET
Basophils Absolute: 0 10*3/uL (ref 0.0–0.1)
Basophils Relative: 1 % (ref 0.0–3.0)
Eosinophils Absolute: 0.2 10*3/uL (ref 0.0–0.7)
Eosinophils Relative: 5.9 % — ABNORMAL HIGH (ref 0.0–5.0)
HCT: 39.6 % (ref 36.0–46.0)
Hemoglobin: 13 g/dL (ref 12.0–15.0)
Lymphocytes Relative: 20.9 % (ref 12.0–46.0)
Lymphs Abs: 0.8 10*3/uL (ref 0.7–4.0)
MCHC: 32.9 g/dL (ref 30.0–36.0)
MCV: 95.2 fl (ref 78.0–100.0)
Monocytes Absolute: 0.4 10*3/uL (ref 0.1–1.0)
Monocytes Relative: 10.5 % (ref 3.0–12.0)
Neutro Abs: 2.4 10*3/uL (ref 1.4–7.7)
Neutrophils Relative %: 61.7 % (ref 43.0–77.0)
Platelets: 268 10*3/uL (ref 150.0–400.0)
RBC: 4.16 Mil/uL (ref 3.87–5.11)
RDW: 13.9 % (ref 11.5–15.5)
WBC: 3.9 10*3/uL — ABNORMAL LOW (ref 4.0–10.5)

## 2021-06-26 LAB — HEPATIC FUNCTION PANEL
ALT: 23 U/L (ref 0–35)
AST: 30 U/L (ref 0–37)
Albumin: 4.5 g/dL (ref 3.5–5.2)
Alkaline Phosphatase: 83 U/L (ref 39–117)
Bilirubin, Direct: 0.1 mg/dL (ref 0.0–0.3)
Total Bilirubin: 0.6 mg/dL (ref 0.2–1.2)
Total Protein: 8.2 g/dL (ref 6.0–8.3)

## 2021-06-26 LAB — VITAMIN B12: Vitamin B-12: >1504 pg/mL — ABNORMAL HIGH (ref 211–911)

## 2021-06-26 LAB — URINALYSIS, ROUTINE W REFLEX MICROSCOPIC
Bilirubin Urine: NEGATIVE
Hgb urine dipstick: NEGATIVE
Ketones, ur: NEGATIVE
Leukocytes,Ua: NEGATIVE
Nitrite: NEGATIVE
RBC / HPF: NONE SEEN (ref 0–?)
Specific Gravity, Urine: >=1.03 — AB (ref 1.000–1.030)
Urine Glucose: NEGATIVE
Urobilinogen, UA: 0.2 (ref 0.0–1.0)
pH: 5.5 (ref 5.0–8.0)

## 2021-06-26 LAB — TSH: TSH: 6.99 u[IU]/mL — ABNORMAL HIGH (ref 0.35–5.50)

## 2021-06-26 LAB — VITAMIN D 25 HYDROXY (VIT D DEFICIENCY, FRACTURES): VITD: 34.64 ng/mL (ref 30.00–100.00)

## 2021-06-26 MED ORDER — LEVOTHYROXINE SODIUM 25 MCG PO TABS: 25.0000 ug | ORAL_TABLET | Freq: Every day | ORAL | 3 refills | Status: DC

## 2021-06-26 NOTE — Patient Instructions (Signed)

## 2021-06-26 NOTE — Progress Notes (Signed)
Patient ID: Wendy Serrano, female   DOB: September 05, 1945, 76 y.o.   MRN: [id number]        Chief Complaint: follow up elevated BP, finger pad swelling, fatigue       HPI:  Wendy Serrano is a 76 y.o. female here with concern about several days of all distal finger pad swelling she has noticed, without fever, trauma, overuse or joint pain.  No hx of copd or clubbing she is aware.  Has ongoing hand joint djd changes, but this is different.  Does c/o ongoing fatigue, but denies signficant daytime hypersomnolence.  BP has been < 140/90 at home.  Pt denies chest pain, increased sob or doe, wheezing, orthopnea, PND, increased LE swelling, palpitations, dizziness or syncope.   Pt denies polydipsia, polyuria, or new focal neuro s/s.   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 She has never used smokeless tobacco. She reports that she does not drink alcohol and does not use  drugs. family history includes Diabetes in her mother; Hyperlipidemia in her sister; Pancreatic cancer in her mother; Prostate cancer in her father. No Known Allergies Current Outpatient Medications on File Prior to Visit  Medication Sig Dispense Refill   aspirin 81 MG chewable tablet Chew 81 mg by mouth daily.     b complex vitamins tablet Take 1 tablet by mouth daily. 100 tablet 3   calcium-vitamin D (OSCAL WITH D) 500-200 MG-UNIT per tablet Take 1 tablet by mouth 2 (two) times daily.     donepezil (ARICEPT) 10 MG tablet Take 1 tablet (10 mg total) by mouth at bedtime. 1 tablet at bedtime 90 tablet 3   famotidine (PEPCID) 40 MG tablet Take 1 tablet (40 mg total) by mouth daily. 90 tablet 3   FOLIC ACID PO Take 4 tablets by mouth daily.     gabapentin (NEURONTIN) 100 MG capsule Take 2 capsules (200 mg total) by mouth at bedtime. 180 capsule 2   glucosamine-chondroitin 500-400 MG tablet Take 1 tablet by mouth 2 (two) times daily.     methotrexate (RHEUMATREX) 2.5 MG tablet   3   Multiple Vitamin (MULTIVITAMIN) tablet Take 1 tablet by mouth daily.  Multiple Vitamins-Minerals (OCUVITE ADULT 50+) CAPS Take 1 capsule by mouth daily.     prednisoLONE acetate (PRED FORTE) 1 % ophthalmic suspension Place 1 drop into both eyes 2 (two) times daily.     SIMBRINZA 1-0.2 % SUSP Place 1 drop into both eyes 3 (three) times daily.  6   No current facility-administered medications on file prior to visit.        ROS:  All others reviewed and negative.  Objective        PE:  BP 136/70    Pulse 77    Temp 98.4 F (36.9 C) (Oral)    Ht 5\' 8"  (1.727 m)    Wt 133 lb 2 oz (60.4 kg)    SpO2 99%    BMI 20.24 kg/m                 Constitutional: Pt appears in NAD               HENT: Head: NCAT.                Right Ear: External ear normal.                 Left Ear: External ear normal.                Eyes: . Pupils are equal, round, and reactive to light. Conjunctivae and EOM are normal                Nose: without d/c or deformity               Neck: Neck supple. Gross normal ROM               Cardiovascular: Normal rate and regular rhythm.                 Pulmonary/Chest: Effort normal and breath sounds without rales or wheezing.                Abd:  Soft, NT, ND, + BS, no organomegaly               Neurological: Pt is alert. At baseline orientation, motor grossly intact               Skin: Skin is warm. No rashes, no other new lesions, LE edema - none                Hand finger pads mild diffuse swelling without erythema,                Psychiatric: Pt behavior is normal without agitation   Micro: none  Cardiac tracings I have personally interpreted today:  none  Pertinent Radiological findings (summarize): none   Lab Results  Component Value Date   WBC 3.9 (L) 06/26/2021   HGB 13.0 06/26/2021   HCT 39.6 06/26/2021   PLT 268.0 06/26/2021   GLUCOSE 78 06/26/2021   CHOL 208 (H) 08/22/2020   TRIG 51.0 08/22/2020   HDL 86.40 08/22/2020   LDLDIRECT 127.9 03/09/2013   LDLCALC 112 (H) 08/22/2020   ALT 23 06/26/2021   AST 30 06/26/2021   NA 143 06/26/2021   K 3.7 06/26/2021   CL 106 06/26/2021   CREATININE 0.83 06/26/2021   BUN 16 06/26/2021   CO2 29 06/26/2021   TSH 6.99 (H) 06/26/2021   INR 1.1 (H) 05/16/2015   HGBA1C 5.4 02/26/2011   Assessment/Plan:  Wendy Serrano is a 76 y.o. Black  or African American [2] female with  has a past medical history of Allergic rhinitis, Atrophy of vagina (07/15/10), C. difficile diarrhea (06/2017), Cervicogenic headache (09/21/2019), Eczema, H/O osteopenia (01/09/04), H/O osteoporosis (2008), H/O varicella, Herpes simplex without mention of complication, History of measles, mumps, or rubella, Leukopenia, Liver hemangioma, Macular degeneration (oct '11), Menopausal symptoms, Renal cyst, Rotator cuff tear, and Yeast infection.  Clubbing of fingers Etiology unclear, will continue to follow for now  Fatigue Etiology unclear, Exam otherwise  benign, to check labs as documented, follow with expectant management   Elevated blood-pressure reading, without diagnosis of hypertension Mild elevated uncontrolled, pt declines change in tx at this time  Memory difficulties Also for b12 level  Vitamin D deficiency Last vitamin D Lab Results  Component Value Date   VD25OH 34.64 06/26/2021   Low, to start oral replacement   Followup: Return if symptoms worsen or fail to improve.  Cathlean Cower, MD 06/30/2021 6:42 PM [facility]

## 2021-06-27 ENCOUNTER — Ambulatory Visit: Payer: Medicare PPO | Admitting: Internal Medicine

## 2021-06-28 ENCOUNTER — Telehealth: Payer: Self-pay | Admitting: Internal Medicine

## 2021-06-28 DIAGNOSIS — R7989 Other specified abnormal findings of blood chemistry: Secondary | ICD-10-CM

## 2021-06-28 NOTE — Telephone Encounter (Signed)
Pts spouse requesting an order for a T4 lab due to pts elevated TSH 06-26-2021 lab result

## 2021-06-30 ENCOUNTER — Encounter: Payer: Self-pay | Admitting: Internal Medicine

## 2021-06-30 DIAGNOSIS — R5383 Other fatigue: Secondary | ICD-10-CM | POA: Insufficient documentation

## 2021-06-30 DIAGNOSIS — E559 Vitamin D deficiency, unspecified: Secondary | ICD-10-CM | POA: Insufficient documentation

## 2021-06-30 DIAGNOSIS — R683 Clubbing of fingers: Secondary | ICD-10-CM | POA: Insufficient documentation

## 2021-06-30 DIAGNOSIS — R030 Elevated blood-pressure reading, without diagnosis of hypertension: Secondary | ICD-10-CM | POA: Insufficient documentation

## 2021-06-30 NOTE — Telephone Encounter (Signed)
OK FT4  Thx

## 2021-06-30 NOTE — Assessment & Plan Note (Signed)
Last vitamin D Lab Results  Component Value Date   VD25OH 34.64 06/26/2021   Low, to start oral replacement

## 2021-06-30 NOTE — Assessment & Plan Note (Signed)
Mild elevated uncontrolled, pt declines change in tx at this time

## 2021-06-30 NOTE — Assessment & Plan Note (Signed)
Also for b12 level

## 2021-06-30 NOTE — Assessment & Plan Note (Signed)
Etiology unclear, Exam otherwise benign, to check labs as documented, follow with expectant management  

## 2021-06-30 NOTE — Assessment & Plan Note (Signed)
Etiology unclear, will continue to follow for now

## 2021-07-01 DIAGNOSIS — H40013 Open angle with borderline findings, low risk, bilateral: Secondary | ICD-10-CM | POA: Diagnosis not present

## 2021-07-03 NOTE — Telephone Encounter (Signed)
Pts spouse checking status of 06-30-2021 T4 lab results, informed caller lab has not been resulted  Spouse requesting a c/b

## 2021-07-04 DIAGNOSIS — M79604 Pain in right leg: Secondary | ICD-10-CM | POA: Diagnosis not present

## 2021-07-04 DIAGNOSIS — M79605 Pain in left leg: Secondary | ICD-10-CM | POA: Diagnosis not present

## 2021-07-05 NOTE — Telephone Encounter (Signed)
I do not see it free T4 report in the chart.  I can only say that it was ordered.  Thanks

## 2021-07-11 ENCOUNTER — Ambulatory Visit: Payer: Medicare PPO | Admitting: Neurology

## 2021-07-19 DIAGNOSIS — H209 Unspecified iridocyclitis: Secondary | ICD-10-CM | POA: Diagnosis not present

## 2021-07-19 DIAGNOSIS — R7989 Other specified abnormal findings of blood chemistry: Secondary | ICD-10-CM | POA: Diagnosis not present

## 2021-07-19 DIAGNOSIS — Z79899 Other long term (current) drug therapy: Secondary | ICD-10-CM | POA: Diagnosis not present

## 2021-07-19 DIAGNOSIS — R946 Abnormal results of thyroid function studies: Secondary | ICD-10-CM | POA: Diagnosis not present

## 2021-07-19 DIAGNOSIS — H40013 Open angle with borderline findings, low risk, bilateral: Secondary | ICD-10-CM | POA: Insufficient documentation

## 2021-07-19 DIAGNOSIS — H35372 Puckering of macula, left eye: Secondary | ICD-10-CM | POA: Diagnosis not present

## 2021-07-19 DIAGNOSIS — H31092 Other chorioretinal scars, left eye: Secondary | ICD-10-CM | POA: Diagnosis not present

## 2021-07-19 DIAGNOSIS — H35363 Drusen (degenerative) of macula, bilateral: Secondary | ICD-10-CM | POA: Diagnosis not present

## 2021-07-19 DIAGNOSIS — H40043 Steroid responder, bilateral: Secondary | ICD-10-CM | POA: Diagnosis not present

## 2021-07-19 DIAGNOSIS — H35351 Cystoid macular degeneration, right eye: Secondary | ICD-10-CM | POA: Diagnosis not present

## 2021-07-25 ENCOUNTER — Ambulatory Visit (INDEPENDENT_AMBULATORY_CARE_PROVIDER_SITE_OTHER): Payer: Medicare PPO

## 2021-07-25 ENCOUNTER — Ambulatory Visit: Payer: Medicare PPO | Admitting: Podiatry

## 2021-07-25 ENCOUNTER — Encounter: Payer: Self-pay | Admitting: Podiatry

## 2021-07-25 ENCOUNTER — Other Ambulatory Visit: Payer: Self-pay

## 2021-07-25 DIAGNOSIS — M2011 Hallux valgus (acquired), right foot: Secondary | ICD-10-CM

## 2021-07-26 NOTE — Progress Notes (Signed)
Subjective:  ? ?Patient ID: Wendy Serrano, female   DOB: 76 y.o.   MRN: [id number]  ? ?HPI ?Patient presents with significant structural bunion deformity right that she is concerned about along with lesion right over left foot which is painful and makes walking difficult.  States that she is tried to trim this herself and is interested about the structural deformity she has ? ? ?ROS ? ? ?   ?Objective:  ?Physical Exam  ?Neurovascular status intact muscle strength found to be adequate range of motion adequate subtalar midtarsal joint.  Patient is noted to have enlargement of the first metatarsal head left over right with redness about the bunion site and keratotic lesion subsecond metatarsal right that is painful when pressed with walking ? ?   ?Assessment:  ?Structural deformity right with bunion deformity that is moderately painful when pressed along with lesion formation that is painful plantar right ? ?   ?Plan:  ?H&P reviewed condition discussed structural abnormality and at this point will try to avoid surgery and utilize wider shoes and mesh materials.  Debrided lesion reappoint for routine care and may require more aggressive treatment in future ? ?X-rays indicate that there is structural deformity with elevation of the intermetatarsal angle right over left ?   ? ? ?

## 2021-08-05 ENCOUNTER — Ambulatory Visit: Payer: Medicare PPO | Admitting: Podiatry

## 2021-09-04 ENCOUNTER — Ambulatory Visit (INDEPENDENT_AMBULATORY_CARE_PROVIDER_SITE_OTHER): Payer: Medicare PPO | Admitting: Internal Medicine

## 2021-09-04 ENCOUNTER — Encounter: Payer: Self-pay | Admitting: Internal Medicine

## 2021-09-04 VITALS — BP 120/70 | HR 77 | Temp 98.2°F | Ht 68.0 in | Wt 134.0 lb

## 2021-09-04 DIAGNOSIS — M25562 Pain in left knee: Secondary | ICD-10-CM

## 2021-09-04 DIAGNOSIS — R42 Dizziness and giddiness: Secondary | ICD-10-CM

## 2021-09-04 DIAGNOSIS — R7989 Other specified abnormal findings of blood chemistry: Secondary | ICD-10-CM | POA: Diagnosis not present

## 2021-09-04 DIAGNOSIS — R208 Other disturbances of skin sensation: Secondary | ICD-10-CM | POA: Diagnosis not present

## 2021-09-04 DIAGNOSIS — G8929 Other chronic pain: Secondary | ICD-10-CM | POA: Diagnosis not present

## 2021-09-04 DIAGNOSIS — M8580 Other specified disorders of bone density and structure, unspecified site: Secondary | ICD-10-CM | POA: Insufficient documentation

## 2021-09-04 DIAGNOSIS — M25561 Pain in right knee: Secondary | ICD-10-CM | POA: Diagnosis not present

## 2021-09-04 DIAGNOSIS — N952 Postmenopausal atrophic vaginitis: Secondary | ICD-10-CM | POA: Insufficient documentation

## 2021-09-04 DIAGNOSIS — N951 Menopausal and female climacteric states: Secondary | ICD-10-CM | POA: Insufficient documentation

## 2021-09-04 LAB — T4, FREE: Free T4: 0.8 ng/dL (ref 0.60–1.60)

## 2021-09-04 LAB — COMPREHENSIVE METABOLIC PANEL
ALT: 22 U/L (ref 0–35)
AST: 27 U/L (ref 0–37)
Albumin: 4.3 g/dL (ref 3.5–5.2)
Alkaline Phosphatase: 79 U/L (ref 39–117)
BUN: 16 mg/dL (ref 6–23)
CO2: 30 mEq/L (ref 19–32)
Calcium: 9.7 mg/dL (ref 8.4–10.5)
Chloride: 105 mEq/L (ref 96–112)
Creatinine, Ser: 0.86 mg/dL (ref 0.40–1.20)
GFR: 65.34 mL/min (ref >60.00)
Glucose, Bld: 88 mg/dL (ref 70–99)
Potassium: 3.6 mEq/L (ref 3.5–5.1)
Sodium: 142 mEq/L (ref 135–145)
Total Bilirubin: 0.5 mg/dL (ref 0.2–1.2)
Total Protein: 7.2 g/dL (ref 6.0–8.3)

## 2021-09-04 LAB — TSH: TSH: 5.57 u[IU]/mL — ABNORMAL HIGH (ref 0.35–5.50)

## 2021-09-04 NOTE — Patient Instructions (Addendum)
Blue-Emu cream -- use 2-3 times a day for arthritis, sore muscles ? ?Magnesium oil spray for muscle cramps ? ?

## 2021-09-04 NOTE — Progress Notes (Signed)
? ?Subjective:  ?Patient ID: Wendy Serrano, female    DOB: 1945-11-17  Age: 76 y.o. MRN: [id number] ? ?CC: No chief complaint on file. ? ? ?HPI ?Wendy Serrano presents for memory problem, BPV, possible hypothyroidism ? ?Outpatient Medications Prior to Visit  ?Medication Sig Dispense Refill  ? aspirin 81 MG chewable tablet Chew 81 mg by mouth daily.    ? b complex vitamins tablet Take 1 tablet by mouth daily. 100 tablet 3  ? calcium-vitamin D (OSCAL WITH D) 500-200 MG-UNIT per tablet Take 1 tablet by mouth 2 (two) times daily.    ? donepezil (ARICEPT) 10 MG tablet Take 1 tablet (10 mg total) by mouth at bedtime. 1 tablet at bedtime 90 tablet 3  ? famotidine (PEPCID) 40 MG tablet Take 1 tablet (40 mg total) by mouth daily. 90 tablet 3  ? gabapentin (NEURONTIN) 100 MG capsule Take 2 capsules (200 mg total) by mouth at bedtime. 180 capsule 2  ? glucosamine-chondroitin 500-400 MG tablet Take 1 tablet by mouth 2 (two) times daily.    ? levothyroxine (SYNTHROID) 25 MCG tablet Take 1 tablet (25 mcg total) by mouth daily. 90 tablet 3  ? meclizine (ANTIVERT) 12.5 MG tablet meclizine 12.5 mg tablet ? TAKE 1 TABLET BY MOUTH 3 TIMES DAILY AS NEEDED FOR DIZZINESS.    ? methotrexate (RHEUMATREX) 2.5 MG tablet   3  ? Multiple Vitamins-Minerals (OCUVITE ADULT 50+) CAPS Take 1 capsule by mouth daily.    ? pantoprazole (PROTONIX) 40 MG tablet pantoprazole 40 mg tablet,delayed release ? TAKE 1 TABLET BY MOUTH EVERY DAY    ? prednisoLONE acetate (PRED FORTE) 1 % ophthalmic suspension Place 1 drop into both eyes 2 (two) times daily.    ? SIMBRINZA 1-0.2 % SUSP Place 1 drop into both eyes 3 (three) times daily.  6  ? FOLIC ACID PO Take 4 tablets by mouth daily.    ? Multiple Vitamin (MULTIVITAMIN) tablet Take 1 tablet by mouth daily.    ? ?No facility-administered medications prior to visit.  ? ? ?ROS: ?Review of Systems  ?Constitutional:  Negative for activity change, appetite change, chills, fatigue and unexpected weight change.   ?HENT:  Negative for congestion, mouth sores and sinus pressure.   ?Eyes:  Negative for visual disturbance.  ?Respiratory:  Negative for cough and chest tightness.   ?Gastrointestinal:  Negative for abdominal pain and nausea.  ?Genitourinary:  Negative for difficulty urinating, frequency and vaginal pain.  ?Musculoskeletal:  Negative for back pain and gait problem.  ?Skin:  Negative for pallor and rash.  ?Neurological:  Negative for dizziness, tremors, weakness, numbness and headaches.  ?Psychiatric/Behavioral:  Positive for decreased concentration. Negative for confusion, sleep disturbance and suicidal ideas.   ? ?Objective:  ?BP 120/70 (BP Location: Left Arm, Patient Position: Sitting, Cuff Size: Large)   Pulse 77   Temp 98.2 ?F (36.8 ?C) (Oral)   Ht '5\' 8"'$  (1.727 m)   Wt 134 lb (60.8 kg)   SpO2 98%   BMI 20.37 kg/m?  ? ?BP Readings from Last 3 Encounters:  ?09/04/21 120/70  ?06/26/21 136/70  ?06/11/21 132/82  ? ? ?Wt Readings from Last 3 Encounters:  ?09/04/21 134 lb (60.8 kg)  ?06/26/21 133 lb 2 oz (60.4 kg)  ?06/11/21 133 lb 3.2 oz (60.4 kg)  ? ? ?Physical Exam ? ?Lab Results  ?Component Value Date  ? WBC 3.9 (L) 06/26/2021  ? HGB 13.0 06/26/2021  ? HCT 39.6 06/26/2021  ? PLT 268.0 06/26/2021  ?  GLUCOSE 78 06/26/2021  ? CHOL 208 (H) 08/22/2020  ? TRIG 51.0 08/22/2020  ? HDL 86.40 08/22/2020  ? LDLDIRECT 127.9 03/09/2013  ? LDLCALC 112 (H) 08/22/2020  ? ALT 23 06/26/2021  ? AST 30 06/26/2021  ? NA 143 06/26/2021  ? K 3.7 06/26/2021  ? CL 106 06/26/2021  ? CREATININE 0.83 06/26/2021  ? BUN 16 06/26/2021  ? CO2 29 06/26/2021  ? TSH 6.99 (H) 06/26/2021  ? INR 1.1 (H) 05/16/2015  ? HGBA1C 5.4 02/26/2011  ? ? ?No results found. ? ?Assessment & Plan:  ? ?Problem List Items Addressed This Visit   ? ? Knee pain, bilateral  ?  Blue-Emu cream was recommended to use 2-3 times a day ? ? ?  ?  ? Relevant Orders  ? TSH  ? T4, free  ? Comprehensive metabolic panel  ? Vertigo - Primary  ?  On Gabapentin po ? ?  ?  ?  Relevant Orders  ? TSH  ? T4, free  ? Comprehensive metabolic panel  ? Dysesthesia of scalp  ?  On Gabapentin po ? ?  ?  ? ?Other Visit Diagnoses   ? ? Abnormal TSH      ? ?  ?  ? ? ?No orders of the defined types were placed in this encounter. ?  ? ? ?Follow-up: Return in about 3 months (around 12/04/2021) for a follow-up visit. ? ?Walker Kehr, MD ?

## 2021-09-04 NOTE — Assessment & Plan Note (Signed)
Blue-Emu cream was recommended to use 2-3 times a day ? ?

## 2021-09-04 NOTE — Assessment & Plan Note (Signed)
On Gabapentin po ?

## 2021-09-05 ENCOUNTER — Other Ambulatory Visit: Payer: Self-pay | Admitting: Internal Medicine

## 2021-09-09 ENCOUNTER — Ambulatory Visit: Payer: Medicare PPO | Admitting: Family Medicine

## 2021-09-11 ENCOUNTER — Ambulatory Visit: Payer: Medicare PPO | Admitting: Neurology

## 2021-09-11 ENCOUNTER — Encounter: Payer: Self-pay | Admitting: Neurology

## 2021-09-11 VITALS — BP 120/77 | HR 67 | Ht 68.0 in | Wt 135.0 lb

## 2021-09-11 DIAGNOSIS — R4189 Other symptoms and signs involving cognitive functions and awareness: Secondary | ICD-10-CM | POA: Diagnosis not present

## 2021-09-11 DIAGNOSIS — F09 Unspecified mental disorder due to known physiological condition: Secondary | ICD-10-CM | POA: Diagnosis not present

## 2021-09-11 NOTE — Progress Notes (Signed)
Contacted pt, Wendy Serrano stated Wendy Serrano has been having some discomfort on L side of head, also having touch sensitivity and wants to see Dr Brett Fairy. Please make appt  . ? ? ? ? ? ?Provider:  Larey Seat, M D  ?Referring Provider: [name], MD ?Primary Care Physician:  [name], MD ? ?Chief Complaint  ?Patient presents with  ? Follow-up  ?  Pt with husband, rm 21. Presents for follow up memory. Overall feels memory is about the same.   ? ? ?HPI:  ?c Wendy Serrano is a 76 y.o.  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 Jannifer Franklin, who has retired. Wendy Serrano is a primary patient of Dr Alain Marion . Wendy Serrano is here for a follow up w visit on memory care. Wendy Serrano had just done a MMSE with my assiatent and forgot to place the hands of the clock into the drawing.  ?Mr Hooser reports his wife is tuning people out - Wendy Serrano doesn't process what Wendy Serrano is told, comprehension is declining. Wendy Serrano is taking lion mane mushroom extract and Prevagen - for almost a year. ?Gabapentin has improved dyseasthesias and Wendy Serrano sleeps better.  ?>MMSE today.  ? ? ?04-29-2021: Followed by Dr. Jannifer Franklin at [facility] for cognitive disorder, tinnitus, had 2 recent visits in ENT, PCP and last night ER- ? ?Area of dyseasthesias , coin sized, crown of the head.  ?Gabapentin decreased sensation and Wendy Serrano sleeps well. Tinnitus not bothering now.  ? ?The patient's husband felt that Wendy Serrano also has some memory impairment today Wendy Serrano was tested by Mini-Mental status exam and Wendy Serrano scored only 22 out of 30 points which is concerning.  The only orientation question Wendy Serrano missed was the date but Wendy Serrano was clear about the day of the week month year or season.  Wendy Serrano knew fully which places and location Wendy Serrano is at, Wendy Serrano had trouble with the serial sevens and missed step 3. Wendy Serrano was able to spell backwards- I gave her 5 points, Wendy Serrano was able to copy an image and draw a clock face.  ? ?  ?HPI ?Rachel Rison  presents for C/o "frying sounds" in the L ear x 2 weeks - better. C/o sensation on the L head - hypersensitive to touch. Wendy Serrano went to ED last night. Has difficulties to explain this sensation- a bubbling or soda pop feeling under the scalp, no numbness.  ?ENT appt did not bring her closer to a solution, the physician felt it was not tinnitus related.  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 Wendy Serrano uses eye drops. 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  ? ? ? ? ? ?Review of Systems: ?Out of a complete 14 system review, the patient complains of only the following symptoms, and all other reviewed systems are negative. ? ?College graduate- was not given MOCA- (?)  ? ?mini-mental status exam: 25 / 30 points .  ?World - 5 points , drawing 1 points. Immediate recall was 1 out of 3 words.  ?Missed date .  ? ? ?  09/11/2021  ?  2:34 PM 04/29/2021  ? 10:21 AM 11/01/2020  ? 10:04 AM 05/03/2020  ? 10:00 AM 11/09/2018  ? 10:35 AM 03/29/2018  ?  8:40 AM  ?MMSE -  Mini Mental State Exam  ?Orientation to time '5 4 4 5 4 5  '$ ?Orientation to Place '5 5 5 5 5 5  '$ ?Registration '3 3 3 3 3 3  '$ ?Attention/ Calculation ? ?WORLD  2/ 5 on spelling  '2 1 5 5 5  '$ ?Recall '1 1 1 2 1 '$ 0  ?Language- name 2 objects '2 2 2 2 2 2  '$ ?Language- repeat 0 0 '1 1 1 1  '$ ?Language- follow 3 step command '3 3 3 3 3 3  '$ ?Language- read & follow direction '1 1 1 1 1 1  '$ ?Write a sentence '1 1 1 1 1 1  '$ ?Copy design 1 0 1 1 0 1  ?Total score '26 22 23 29 26 27  '$ ? ? ? ? ?Social History  ? ?Socioeconomic History  ? Marital status: Married  ?  Spouse name: [name]  ? Number of children: 0  ? Years of education: Not on file  ? Highest education level: Not on file  ?Occupational History  ? Occupation: Retired  ?Tobacco Use  ? Smoking status: Never  ? Smokeless tobacco: Never  ?Substance and Sexual Activity  ? Alcohol use: No   ? Drug use: No  ? Sexual activity: Never  ?  Birth control/protection: Post-menopausal  ?Other Topics Concern  ? Not on file  ?Social History Narrative  ? 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 Has 11 children who were foster care or mentoredRetiredSO retired, Has had some health problems. They are doing well  ? Marriage is in good health and retirement is goodEnd-of-life: provided packet of information and forms for consideration (Oct'11)  ?   ? Lives with husband  ? Right handed  ? Caffeine: zero  ? ?Social Determinants of Health  ? ?Financial Resource Strain: Low Risk   ? Difficulty of Paying Living Expenses: Not hard at all  ?Food Insecurity: No Food Insecurity  ? Worried About Charity fundraiser in the Last Year: Never true  ? Ran Out of Food in the Last Year: Never true  ?Transportation Needs: No Transportation Needs  ? Lack of Transportation (Medical): No  ? Lack of Transportation (Non-Medical): No  ?Physical Activity: Insufficiently Active  ? Days of Exercise per Week: 3 days  ? Minutes of Exercise per Session: 30 min  ?Stress: No Stress Concern Present  ? Feeling of Stress : Not at all  ?Social Connections: Socially Integrated  ? Frequency of Communication with Friends and Family: Twice a week  ? Frequency of Social Gatherings with Friends and Family: Twice a week  ? Attends Religious Services: More than 4 times per year  ? Active Member of Clubs or Organizations: Yes  ? Attends Archivist Meetings: 1 to 4 times per year  ? Marital Status: Married  ?Intimate Partner Violence: Not At Risk  ? Fear of Current or Ex-Partner: No  ? Emotionally Abused: No  ? Physically Abused: No  ? Sexually Abused: No  ? ? ?Family History  ?Problem Relation Age of Onset  ? Prostate cancer Father   ?     died  ? Pancreatic cancer Mother   ?     died  ? Diabetes Mother   ? Hyperlipidemia Sister   ? ? ?Past Medical History:  ?Diagnosis Date  ? Allergic rhinitis   ? 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 C. difficile diarrhea 06/2017  ? Cervicogenic headache 09/21/2019  ? Eczema   ? childhood  ?  H/O osteopenia 01/09/04  ? H/O osteoporosis 2008  ? H/O varicella   ? Herpes simplex without mention of complication   ? uncomplicated  ? History of measles, mumps, or rubella   ? Leukopenia   ? chronic  ? Liver hemangioma   ? Macular degeneration oct '11  ? early and mild and stable  ? Menopausal symptoms   ? Renal cyst   ? bilateral  ? Rotator cuff tear   ? Yeast infection   ? ? ?Past Surgical History:  ?Procedure Laterality Date  ? ROTATOR CUFF REPAIR Left 08/2009  ? Left shoulder-rotator cuff repair [facility])  ? WISDOM TOOTH EXTRACTION    ? ? ?Current Outpatient Medications  ?Medication Sig Dispense Refill  ? aspirin 81 MG chewable tablet Chew 81 mg by mouth daily.    ? b complex vitamins tablet Take 1 tablet by mouth daily. 100 tablet 3  ? calcium-vitamin D (OSCAL WITH D) 500-200 MG-UNIT per tablet Take 1 tablet by mouth 2 (two) times daily.    ? donepezil (ARICEPT) 10 MG tablet Take 1 tablet (10 mg total) by mouth at bedtime. 1 tablet at bedtime 90 tablet 3  ? famotidine (PEPCID) 40 MG tablet Take 1 tablet (40 mg total) by mouth daily. 90 tablet 3  ? gabapentin (NEURONTIN) 100 MG capsule Take 2 capsules (200 mg total) by mouth at bedtime. 180 capsule 2  ? glucosamine-chondroitin 500-400 MG tablet Take 1 tablet by mouth 2 (two) times daily.    ? Magnesium 250 MG TABS Take 2 tablets by mouth daily.    ? meclizine (ANTIVERT) 12.5 MG tablet meclizine 12.5 mg tablet ? TAKE 1 TABLET BY MOUTH 3 TIMES DAILY AS NEEDED FOR DIZZINESS.    ? methotrexate (RHEUMATREX) 2.5 MG tablet   3  ? Multiple Vitamins-Minerals (OCUVITE ADULT 50+) CAPS Take 1 capsule by mouth daily.    ? pantoprazole (PROTONIX) 40 MG tablet pantoprazole 40 mg tablet,delayed release ? TAKE 1 TABLET BY MOUTH EVERY DAY    ? prednisoLONE acetate (PRED FORTE) 1 % ophthalmic suspension Place 1 drop into both eyes 2 (two) times daily.    ? SIMBRINZA 1-0.2 % SUSP  Place 1 drop into both eyes 3 (three) times daily.  6  ? ?No current facility-administered medications for this visit.  ? ? ?Allergies as of 09/11/2021  ? (No Known Allergies)  ? ?CT HEAD WITHOUT CONTR

## 2021-09-11 NOTE — Patient Instructions (Signed)
There are well-accepted and sensible ways to reduce risk for Alzheimers disease and other degenerative brain disorders . ? ?Exercise Daily Walk A daily 20 minute walk should be part of your routine. Disease related apathy can be a significant roadblock to exercise and the only way to overcome this is to make it a daily routine and perhaps have a reward at the end (something your loved one loves to eat or drink perhaps) or a personal trainer coming to the home can also be very useful. Most importantly, the patient is much more likely to exercise if the caregiver / spouse does it with him/her. In general a structured, repetitive schedule is best. ? ?General Health: Any diseases which effect your body will effect your brain such as a pneumonia, urinary infection, blood clot, heart attack or stroke. Keep contact with your primary care doctor for regular follow ups. ? ?Sleep. A good nights sleep is healthy for the brain. Seven hours is recommended. If you have insomnia or poor sleep habits we can give you some instructions. If you have sleep apnea wear your mask. ? ?Diet: Eating a heart healthy diet is also a good idea; fish and poultry instead of red meat, nuts (mostly non-peanuts), vegetables, fruits, olive oil or canola oil (instead of butter), minimal salt (use other spices to flavor foods), whole grain rice, bread, cereal and pasta and wine in moderation.Research is now showing that the MIND diet, which is a combination of The Mediterranean diet and the DASH diet, is beneficial for cognitive processing and longevity. Information about this diet can be found in The MIND Diet, a book by Doyne Keel, MS, RDN, and online at NotebookDistributors.si ? ?Finances, Power of Producer, television/film/video Directives: You should consider putting legal safeguards in place with regard to financial and medical decision making. While the spouse always has power of attorney for medical and financial issues in the  absence of any form, you should consider what you want in case the spouse / caregiver is no longer around or capable of making decisions.  ? ?The Alzheimers Association Position on Disease Prevention ? ?Can Alzheimer's be prevented? It's a question that continues to intrigue researchers and fuel new investigations. There are no clear-cut answers yet -- partially due to the need for more large-scale studies in diverse populations -- but promising research is under way. The Alzheimer's Association? is leading the worldwide effort to find a treatment for Alzheimer's, delay its onset and prevent it from developing.  ? ?What causes Alzheimer's? ?Experts agree that in the vast majority of cases, Alzheimer's, like other common chronic conditions, probably develops as a result of complex interactions among multiple factors, including age, genetics, environment, lifestyle and coexisting medical conditions. Although some risk factors -- such as age or genes -- cannot be changed, other risk factors -- such as high blood pressure and lack of exercise -- usually can be changed to help reduce risk. Research in these areas may lead to new ways to detect those at highest risk. ? ?Prevention studies ?A small percentage of people with Alzheimer's disease (less than 1 percent) have an early-onset type associated with genetic mutations. Individuals who have these genetic mutations are guaranteed to develop the disease. An ongoing clinical trial conducted by the Domin[facility] Inherited [facility] (DIAN), is testing whether antibodies to beta-amyloid can reduce the accumulation of beta-amyloid plaque in the brains of people with such genetic mutations and thereby reduce, delay or prevent symptoms. Participants in the trial are receiving antibodies (  or placebo) before they develop symptoms, and the development of beta-amyloid plaques is being monitored by brain scans and other tests. ? ?Another clinical trial, known as the A4 trial  (Anti-Amyloid Treatment in Asymptomatic Alzheimer's), is testing whether antibodies to beta-amyloid can reduce the risk of Alzheimer's disease in older people (ages 78 to 82) at high risk for the disease. The A4 trial is being conducted by the Alzheimer's Disease Cooperative Study. ? ?Though research is still evolving, evidence is strong that people can reduce their risk by making key lifestyle changes, including participating in regular activity and maintaining good heart health. Based on this research, the Alzheimer's Association offers 10 Ways to Love Your Brain -- a collection of tips that can reduce the risk of cognitive decline. ? ?Heart-head connection ? ?New research shows there are things we can do to reduce the risk of mild cognitive impairment and dementia. ? ?Several conditions known to increase the risk of cardiovascular disease -- such as high blood pressure, diabetes and high cholesterol -- also increase the risk of developing Alzheimer's. Some autopsy studies show that as many as 38 percent of individuals with Alzheimer's disease also have cardiovascular disease. ? ?A longstanding question is why some people develop hallmark Alzheimer's plaques and tangles but do not develop the symptoms of Alzheimer's. Vascular disease may help researchers eventually find an answer. Some autopsy studies suggest that plaques and tangles may be present in the brain without causing symptoms of cognitive decline unless the brain also shows evidence of vascular disease. More research is needed to better understand the link between vascular health and Alzheimer's. ? ?Physical exercise and diet ?Regular physical exercise may be a beneficial strategy to lower the risk of Alzheimer's and vascular dementia. Exercise may directly benefit brain cells by increasing blood and oxygen flow in the brain. Because of its known cardiovascular benefits, a medically approved exercise program is a valuable part of any overall wellness  plan. ? ?Current evidence suggests that heart-healthy eating may also help protect the brain. Heart-healthy eating includes limiting the intake of sugar and saturated fats and making sure to eat plenty of fruits, vegetables, and whole grains. No one diet is best. Two diets that have been studied and may be beneficial are the DASH (Dietary Approaches to Stop Hypertension) diet and the Mediterranean diet. The DASH diet emphasizes vegetables, fruits and fat-free or low-fat dairy products; includes whole grains, fish, poultry, beans, seeds, nuts and vegetable oils; and limits sodium, sweets, sugary beverages and red meats. A Mediterranean diet includes relatively little red meat and emphasizes whole grains, fruits and vegetables, fish and shellfish, and nuts, olive oil and other healthy fats. ? ?Social connections and intellectual activity ?A number of studies indicate that maintaining strong social connections and keeping mentally active as we age might lower the risk of cognitive decline and Alzheimer's. Experts are not certain about the reason for this association. It may be due to direct mechanisms through which social and mental stimulation strengthen connections between nerve cells in the brain. ? ?Head trauma ?There appears to be a strong link between future risk of Alzheimer's and serious head trauma, especially when injury involves loss of consciousness. You can help reduce your risk of Alzheimer's by protecting your head. ? Wear a seat belt ? Use a helmet when participating in sports ? "Fall-proof" your home ?  ?What you can do now ?While research is not yet conclusive, certain lifestyle choices, such as physical activity and diet, may help support brain  health and prevent Alzheimer's. Many of these lifestyle changes have been shown to lower the risk of other diseases, like heart disease and diabetes, which have been linked to Alzheimer's. With few drawbacks and plenty of known benefits, healthy lifestyle  choices can improve your health and possibly protect your brain. ? ?Learn more about brain health. ?You can help increase our knowledge by considering participation in a clinical study. Our free clinical trial matc

## 2021-09-20 ENCOUNTER — Telehealth: Payer: Self-pay | Admitting: Neurology

## 2021-09-20 NOTE — Telephone Encounter (Signed)
Pt said,gabapentin (NEURONTIN) 100 MG capsule causing swelling in hands and feet. Want to know if can decrease dosage to 1 tablet instead of two. Would like a call from the nurse. ?

## 2021-09-23 NOTE — Telephone Encounter (Signed)
Pt is asking for a call back from Emma, RN 

## 2021-09-23 NOTE — Telephone Encounter (Signed)
Called and spoke with pt. Advised it would be ok for her to take gabapentin '100mg'$  po qhs to see if this helps improve sx. If she continues to having swelling over the next week after decreasing dose, she should f/u with PCP to be further evaluated. She verbalized understanding.  ?

## 2021-09-23 NOTE — Telephone Encounter (Signed)
LVM returning pt call. 

## 2021-09-25 ENCOUNTER — Telehealth: Payer: Medicare PPO

## 2021-09-25 NOTE — Progress Notes (Deleted)
Chronic Care Management Pharmacy Note  09/25/2021 Name:  Wendy Serrano MRN:  [id number] DOB:  1946/01/21  Summary: -Pt is doing well overall; she wants to know if she should continue donepezil at 10 mg dose, she is currently tolerating it well -Pt is a candidate for osteoporosis pharmalogic treatment given results of most recent DEXA scan; she is open to starting Prolia injections if insurance covers it -Pt is no longer taking a folic acid supplement despite weekly methotrexate -Pt complains of frequent muscle cramps; she does work out 3x a week at fitness center and reports she does not drink enough fluids  Recommendations/Changes made from today's visit: -Advised pt to continue donepezil 10 mg -Recommend Prolia 60 mg injection q6 months - pending insurance benefit investigation (consulting with PCP) -Recommend to restart folic acid 1 mg daily -Advised to increase fluid intake to 64 oz daily   Subjective: Wendy Serrano is an 76 y.o. year old female who is a primary patient of Plotnikov, Evie Lacks, MD.  The CCM team was consulted for assistance with disease management and care coordination needs.    Engaged with patient by telephone for follow up visit in response to provider referral for pharmacy case management and/or care coordination services.   Consent to Services:  The patient was given the following information about Chronic Care Management services today, agreed to services, and gave verbal consent: 1. CCM service includes personalized support from designated clinical staff supervised by the primary care provider, including individualized plan of care and coordination with other care providers 2. 24/7 contact phone numbers for assistance for urgent and routine care needs. 3. Service will only be billed when office clinical staff spend 20 minutes or more in a month to coordinate care. 4. Only one practitioner may furnish and bill the service in a calendar month. 5.The patient may  stop CCM services at any time (effective at the end of the month) by phone call to the office staff. 6. The patient will be responsible for cost sharing (co-pay) of up to 20% of the service fee (after annual deductible is met). Patient agreed to services and consent obtained.  Patient Care Team: Plotnikov, Evie Lacks, MD as PCP - General (Internal Medicine) Leo Grosser, Seymour Bars, MD (Inactive) (Obstetrics and Gynecology) Marybelle Killings, MD (Orthopedic Surgery) Ara Kussmaul, MD (Ophthalmology) Paralee Cancel, MD as Consulting Physician (Orthopedic Surgery) Feliz Beam, MD as Referring Physician (Ophthalmology) Carmelina Dane, MD as Referring Physician (Ophthalmology) Servando Salina, MD as Consulting Physician (Obstetrics and Gynecology) Melida Quitter, MD as Consulting Physician (Otolaryngology) Kathrynn Ducking, MD (Inactive) as Consulting Physician (Neurology) Charlton Haws, [facility] as Pharmacist (Pharmacist) Tomasa Blase, [facility] as Pharmacist (Pharmacist)  Recent office visits: 09/04/2021 - Dr. Alain Marion - folic acid stopped? 06/26/2021 - Dr. [name] - start vitamin D  06/11/2021 - Dr. Alain Marion - off protonix, restart famotidine 40mg  daily  04/29/2021 - Dr. [name] - f/u - recent COVID infection - no changes to medications - paxlovid course complete    Recent consult visits: 09/11/2021 - Dr. Brett Fairy - Neurology - gabapentin increased to 200mg  nightly  07/25/2021 - Dr. Paulla Dolly - Podiatry - no changes to medications  07/19/2021 - Dr. Raelyn Number - Ophthalmology - Decrease folic acid to 3mg  daily - f/u in 4 months  07/19/2021 - Dr. Tomi Likens - Ophthalmology - decreased mtx to 15mg  weekly f/u in 17 weeks  07/01/2021 - Dr. Delsa Sale - Ophthalmology - no changes to medications  06/05/2021 - Dr. Redmond Baseman - ENT -  no changes to medications - f/u prn 04/29/2021 - Dr. Brett Fairy - Neurology - gabapentin $RemoveBefo'200mg'[id number]$  at bedtime   Hospital visits: 04/19/2021 - ED visit - COVID + - paxlovid course rx'd    Objective:  Lab  Results  Component Value Date   CREATININE 0.86 09/04/2021   BUN 16 09/04/2021   GFR 65.34 09/04/2021   GFRNONAA 111.51 02/19/2010   GFRAA 81 02/02/2008   NA 142 09/04/2021   K 3.6 09/04/2021   CALCIUM 9.7 09/04/2021   CO2 30 09/04/2021   GLUCOSE 88 09/04/2021    Lab Results  Component Value Date/Time   HGBA1C 5.4 02/26/2011 10:50 AM   HGBA1C 5.6 02/19/2010 11:00 AM   GFR 65.34 09/04/2021 09:47 AM   GFR 68.27 06/26/2021 09:40 AM    Last diabetic Eye exam: No results found for: HMDIABEYEEXA  Last diabetic Foot exam: No results found for: HMDIABFOOTEX   Lab Results  Component Value Date   CHOL 208 (H) 08/22/2020   HDL 86.40 08/22/2020   LDLCALC 112 (H) 08/22/2020   LDLDIRECT 127.9 03/09/2013   TRIG 51.0 08/22/2020   CHOLHDL 2 08/22/2020       Latest Ref Rng & Units 09/04/2021    9:47 AM 06/26/2021    9:40 AM 08/22/2020   11:08 AM  Hepatic Function  Total Protein 6.0 - 8.3 g/dL 7.2   8.2   7.5    Albumin 3.5 - 5.2 g/dL 4.3   4.5   4.3    AST 0 - 37 U/L $Remo'27   30   22    'qkudt$ ALT 0 - 35 U/L $Remo'22   23   19    'jelwZ$ Alk Phosphatase 39 - 117 U/L 79   83   69    Total Bilirubin 0.2 - 1.2 mg/dL 0.5   0.6   0.6    Bilirubin, Direct 0.0 - 0.3 mg/dL  0.1       Lab Results  Component Value Date/Time   TSH 5.57 (H) 09/04/2021 09:47 AM   TSH 6.99 (H) 06/26/2021 09:40 AM   FREET4 0.80 09/04/2021 09:47 AM   FREET4 0.78 08/23/2020 04:27 PM       Latest Ref Rng & Units 06/26/2021    9:40 AM 08/22/2020   11:08 AM 06/16/2019   11:25 AM  CBC  WBC 4.0 - 10.5 K/uL 3.9   3.9   4.6    Hemoglobin 12.0 - 15.0 g/dL 13.0   13.4   13.1    Hematocrit 36.0 - 46.0 % 39.6   39.8   39.3    Platelets 150.0 - 400.0 K/uL 268.0   290.0   272.0      Lab Results  Component Value Date/Time   VD25OH 34.64 06/26/2021 09:40 AM   VD25OH 37.45 06/16/2019 11:25 AM    Clinical ASCVD: Yes  The 10-year ASCVD risk score (Arnett DK, et al., 2019) is: 22.3%   Values used to calculate the score:     Age: 26 years      Sex: Female     Is Non-Hispanic African American: Yes     Diabetic: No     Tobacco smoker: No     Systolic Blood Pressure: 128 mmHg     Is BP treated: No     HDL Cholesterol: 86.4 mg/dL     Total Cholesterol: 208 mg/dL       06/26/2021    8:57 AM 03/29/2021    2:44 PM 03/29/2021    2:41  PM  Depression screen PHQ 2/9  Decreased Interest 0 0 0  Down, Depressed, Hopeless 0 0 0  PHQ - 2 Score 0 0 0     Social History   Tobacco Use  Smoking Status Never  Smokeless Tobacco Never   BP Readings from Last 3 Encounters:  09/11/21 120/77  09/04/21 120/70  06/26/21 136/70   Pulse Readings from Last 3 Encounters:  09/11/21 67  09/04/21 77  06/26/21 77   Wt Readings from Last 3 Encounters:  09/11/21 135 lb (61.2 kg)  09/04/21 134 lb (60.8 kg)  06/26/21 133 lb 2 oz (60.4 kg)   BMI Readings from Last 3 Encounters:  09/11/21 20.53 kg/m  09/04/21 20.37 kg/m  06/26/21 20.24 kg/m    Assessment/Interventions: Review of patient past medical history, allergies, medications, health status, including review of consultants reports, laboratory and other test data, was performed as part of comprehensive evaluation and provision of chronic care management services.   SDOH:  (Social Determinants of Health) assessments and interventions performed: Yes  SDOH Screenings   Alcohol Screen: Low Risk    Last Alcohol Screening Score (AUDIT): 0  Depression (PHQ2-9): Low Risk    PHQ-2 Score: 0  Financial Resource Strain: Low Risk    Difficulty of Paying Living Expenses: Not hard at all  Food Insecurity: No Food Insecurity   Worried About Charity fundraiser in the Last Year: Never true   Ran Out of Food in the Last Year: Never true  Housing: Low Risk    Last Housing Risk Score: 0  Physical Activity: Insufficiently Active   Days of Exercise per Week: 3 days   Minutes of Exercise per Session: 30 min  Social Connections: Engineer, building services of Communication with Friends and  Family: Twice a week   Frequency of Social Gatherings with Friends and Family: Twice a week   Attends Religious Services: More than 4 times per year   Active Member of [employer] or Organizations: Yes   Attends Archivist Meetings: 1 to 4 times per year   Marital Status: Married  Stress: No Stress Concern Present   Feeling of Stress : Not at all  Tobacco Use: Low Risk    Smoking Tobacco Use: Never   Smokeless Tobacco Use: Never   Passive Exposure: Not on file  Transportation Needs: No Transportation Needs   Lack of Transportation (Medical): No   Lack of Transportation (Non-Medical): No    CCM Care Plan  No Known Allergies  Medications Reviewed Today     Reviewed by Larey Seat, MD (Physician) on 09/11/21 at 1540  Med List Status: <None>   Medication Order Taking? Sig Documenting Provider Last Dose Status Informant  aspirin 81 MG chewable tablet [id number] Yes Chew 81 mg by mouth daily. [provider] Taking Active   b complex vitamins tablet [id number] Yes Take 1 tablet by mouth daily. Plotnikov, Evie Lacks, MD Taking Active   calcium-vitamin D (OSCAL WITH D) 500-200 MG-UNIT per tablet [id number] Yes Take 1 tablet by mouth 2 (two) times daily. [provider] Taking Active   donepezil (ARICEPT) 10 MG tablet [id number] Yes Take 1 tablet (10 mg total) by mouth at bedtime. 1 tablet at bedtime Plotnikov, Evie Lacks, MD Taking Active   famotidine (PEPCID) 40 MG tablet [id number] Yes Take 1 tablet (40 mg total) by mouth daily. Plotnikov, Evie Lacks, MD Taking Active   gabapentin (NEURONTIN) 100 MG capsule [id number] Yes Take 2 capsules (200 mg total)  by mouth at bedtime. Dohmeier, Asencion Partridge, MD Taking Active   glucosamine-chondroitin 500-400 MG tablet [id number] Yes Take 1 tablet by mouth 2 (two) times daily. [provider] Taking Active   Magnesium 250 MG TABS [id number] Yes Take 2 tablets by mouth daily. [provider] Taking Active   meclizine  (ANTIVERT) 12.5 MG tablet [id number] Yes meclizine 12.5 mg tablet  TAKE 1 TABLET BY MOUTH 3 TIMES DAILY AS NEEDED FOR DIZZINESS. [provider] Taking Active   methotrexate (RHEUMATREX) 2.5 MG tablet [id number] Yes  [provider] Taking Active            Med Note Madilyn Hook Aug 23, 2015 10:23 AM) Received from: External Pharmacy  Multiple Vitamins-Minerals (OCUVITE ADULT 50+) CAPS [id number] Yes Take 1 capsule by mouth daily. [provider] Taking Active   pantoprazole (PROTONIX) 40 MG tablet [id number] Yes pantoprazole 40 mg tablet,delayed release  TAKE 1 TABLET BY MOUTH EVERY DAY [provider] Taking Active   prednisoLONE acetate (PRED FORTE) 1 % ophthalmic suspension [id number] Yes Place 1 drop into both eyes 2 (two) times daily. [provider] Taking Active   SIMBRINZA 1-0.2 % SUSP [id number] Yes Place 1 drop into both eyes 3 (three) times daily. [provider] Taking Active            Med Note Madilyn Hook Aug 23, 2015 10:23 AM) Received from: External Pharmacy Received Sig: PLACE 1 DROP INTO BOTH EYES TWICE DAILY            Patient Active Problem List   Diagnosis Date Noted   Atrophic vaginitis 09/04/2021   Menopausal symptom 09/04/2021   Osteopenia 09/04/2021   At low risk for open-angle glaucoma in both eyes 07/19/2021   Clubbing of fingers 06/30/2021   Fatigue 06/30/2021   Elevated blood-pressure reading, without diagnosis of hypertension 06/30/2021   Vitamin D deficiency 06/30/2021   Dysesthesia of scalp 04/29/2021   History of COVID-19 04/29/2021   Neuralgia involving scalp 03/28/2021   Headache 03/12/2021   Ear canal abrasion, right, initial encounter 03/01/2021   GERD (gastroesophageal reflux disease) 01/24/2021   Leg cramps 01/24/2021   Atherosclerosis of aorta ([place]) 10/11/2020   Foot pain, bilateral 05/14/2020   Neck pain 05/11/2020   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  04/20/2020   Asymptomatic varicose veins of both lower extremities 07/07/2019   Hyperpigmentation 07/06/2019   Rhytides 07/06/2019   Mild cognitive disorder 06/16/2019   Memory difficulties 02/14/2019   Ear pain, left 08/12/2018   Sensorineural hearing loss (SNHL), bilateral 01/04/2018   Vertigo 11/19/2017   CME (cystoid macular edema), right 08/28/2017   Stress fracture of metatarsal bone of left foot 07/31/2017   Uveitis 06/06/2016   Nuclear sclerosis, bilateral 04/08/2016   Benign paroxysmal positional vertigo 08/23/2015   Knee pain, bilateral 08/23/2015   High risk medication use 07/24/2015   Puckering of macula, left eye 07/24/2015   Drusen (degenerative) of macula, bilateral 12/15/2014   Iridocyclitis, 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   Nuclear cataract of both eyes 11/15/2014   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/15/2014   Steroid responder, bilateral 11/15/2014   Tinnitus of left ear 05/24/2013   Elevated serum cholesterol 12/25/2011   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   Well adult exam 03/01/2011   Osteoporosis 02/19/2010   UNSPECIFIED PERIPHERAL VASCULAR DISEASE 11/29/2009   ALLERGIC RHINITIS 05/12/2007   HERPES SIMPLEX, UNCOMPLICATED 36/14/4315   Herpesviral infection, unspecified 01/25/2007  Immunization History  Administered Date(s) Administered   Fluad Quad(high Dose 65+) 01/12/2019   Influenza Split 02/26/2011, 03/01/2012   Influenza Whole 02/10/2008, 02/15/2009, 02/19/2010   Influenza, High Dose Seasonal PF 03/09/2013, 03/21/2015, 04/01/2016, 03/03/2017, 01/23/2021   Influenza,inj,Quad PF,6+ Mos 05/10/2014, 02/13/2018   PFIZER(Purple Top)SARS-COV-2 Vaccination 05/25/2019, 06/15/2019, 02/04/2020, 08/11/2020   Pfizer Covid-19 Vaccine Bivalent Booster 29yrs & up 01/23/2021   Pneumococcal Conjugate-13 03/23/2013   Pneumococcal Polysaccharide-23 02/19/2010, 05/18/2013   Td 02/10/2008   Tdap 06/14/2018   Zoster, Live 07/04/2009    Conditions to  be addressed/monitored:  Hyperlipidemia, GERD, and Osteoporosis, Uveitis, Memory difficulties  There are no care plans that you recently modified to display for this patient.     Medication Assistance: None required.  Patient affirms current coverage meets needs.  Care Gaps: Shingrix Vaccine   Star-Rating Drugs: None  Patient's preferred pharmacy is:  [id number] - [place], [place] - [address] [place] [address] Phone: [phone] Fax: [phone]  [address] [place], [place] [address] Phone: [phone] Fax: [phone]   Uses pill box? Yes Pt endorses 100% compliance  Care Plan and Follow Up Patient Decision:  Patient agrees to Care Plan and Follow-up.  Plan: Telephone follow up appointment with care management team member scheduled for:  6 months  ***

## 2021-10-10 ENCOUNTER — Telehealth: Payer: Medicare PPO

## 2021-10-17 DIAGNOSIS — Z1231 Encounter for screening mammogram for malignant neoplasm of breast: Secondary | ICD-10-CM | POA: Diagnosis not present

## 2021-11-04 ENCOUNTER — Ambulatory Visit: Payer: Medicare PPO | Admitting: Internal Medicine

## 2021-11-04 ENCOUNTER — Encounter: Payer: Self-pay | Admitting: Internal Medicine

## 2021-11-04 DIAGNOSIS — R413 Other amnesia: Secondary | ICD-10-CM | POA: Diagnosis not present

## 2021-11-04 DIAGNOSIS — R252 Cramp and spasm: Secondary | ICD-10-CM

## 2021-11-04 DIAGNOSIS — H9312 Tinnitus, left ear: Secondary | ICD-10-CM | POA: Diagnosis not present

## 2021-11-04 MED ORDER — METHYLPHENIDATE HCL 5 MG PO TABS: ORAL_TABLET | ORAL | 0 refills | Status: DC

## 2021-11-04 MED ORDER — PREGABALIN 25 MG PO CAPS: 25.0000 mg | ORAL_CAPSULE | Freq: Every evening | ORAL | 3 refills | Status: DC | PRN

## 2021-11-04 NOTE — Assessment & Plan Note (Signed)
Pt reduced Gabapentin  - - worse tinnitus Will try Gabapentin prn - low dose  Potential benefits of a long term Lyrica use as well as potential risks  and complications were explained to the patient and were aknowledged.

## 2021-11-04 NOTE — Assessment & Plan Note (Signed)
Use sneakers in the house Mag oil spray

## 2021-11-15 DIAGNOSIS — H35351 Cystoid macular degeneration, right eye: Secondary | ICD-10-CM | POA: Diagnosis not present

## 2021-11-15 DIAGNOSIS — Z79899 Other long term (current) drug therapy: Secondary | ICD-10-CM | POA: Diagnosis not present

## 2021-11-15 DIAGNOSIS — H35372 Puckering of macula, left eye: Secondary | ICD-10-CM | POA: Diagnosis not present

## 2021-11-15 DIAGNOSIS — H209 Unspecified iridocyclitis: Secondary | ICD-10-CM | POA: Diagnosis not present

## 2021-11-15 DIAGNOSIS — H35363 Drusen (degenerative) of macula, bilateral: Secondary | ICD-10-CM | POA: Diagnosis not present

## 2021-11-15 DIAGNOSIS — R7989 Other specified abnormal findings of blood chemistry: Secondary | ICD-10-CM | POA: Diagnosis not present

## 2021-11-18 ENCOUNTER — Telehealth: Payer: Self-pay | Admitting: Internal Medicine

## 2021-11-18 NOTE — Telephone Encounter (Signed)
Called pt to get more detail on the concerns she is having about labs and what they are.

## 2021-11-19 NOTE — Telephone Encounter (Signed)
Pt returned call and informed me that she saw a doctor in [place] and they did labs. Her Thyroid lab gave an abnormal result and they told her to consult with her PCP about the abnormal lab result.   She is unsure what the lab results mean and would like to discuss the results and what are her next steps.   Pt can be reached at [phone]

## 2021-11-21 NOTE — Telephone Encounter (Signed)
Wendy Serrano has a mildly elevated TSH.  However, her more accurate test free T4 and free T3 are normal (similar results to what she had in April)-- no change in plans. Thanks

## 2021-11-26 NOTE — Telephone Encounter (Signed)
Could not LVM for pt in regards to Dr. Judeen Hammans message for pt "Wendy Serrano has a mildly elevated TSH.  However, her more accurate test free T4 and free T3 are normal (similar results to what she had in April)-- no change in plans."

## 2021-12-10 DIAGNOSIS — Z780 Asymptomatic menopausal state: Secondary | ICD-10-CM | POA: Diagnosis not present

## 2021-12-10 DIAGNOSIS — Z01419 Encounter for gynecological examination (general) (routine) without abnormal findings: Secondary | ICD-10-CM | POA: Diagnosis not present

## 2021-12-12 ENCOUNTER — Telehealth: Payer: Medicare PPO

## 2022-01-06 ENCOUNTER — Encounter: Payer: Self-pay | Admitting: Internal Medicine

## 2022-01-06 ENCOUNTER — Ambulatory Visit: Payer: Medicare PPO | Admitting: Internal Medicine

## 2022-01-06 DIAGNOSIS — H9313 Tinnitus, bilateral: Secondary | ICD-10-CM

## 2022-01-06 DIAGNOSIS — R252 Cramp and spasm: Secondary | ICD-10-CM

## 2022-01-06 DIAGNOSIS — R413 Other amnesia: Secondary | ICD-10-CM | POA: Diagnosis not present

## 2022-01-06 DIAGNOSIS — Z961 Presence of intraocular lens: Secondary | ICD-10-CM | POA: Diagnosis not present

## 2022-01-06 DIAGNOSIS — H40013 Open angle with borderline findings, low risk, bilateral: Secondary | ICD-10-CM | POA: Diagnosis not present

## 2022-01-06 NOTE — Assessment & Plan Note (Signed)
Use sneakers in the house - 4 floors, taking stairs Use Mag oil spray

## 2022-01-06 NOTE — Assessment & Plan Note (Signed)
Cont on Donepezil Ritalin option was discussed

## 2022-01-06 NOTE — Assessment & Plan Note (Addendum)
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 No vertigo AIM Hearing referral

## 2022-01-06 NOTE — Progress Notes (Signed)
Subjective:  Patient ID: Wendy Serrano, female    DOB: 04-07-1946  Age: 76 y.o. MRN: [id number]  CC: Tinnitus (Pt states it stated 2 weeks ago, mainly left ear. Need ref to audiologist) and Leg Pain (Pt states she been having discomfort in her legs, mainly' \\in'$  the morning. been using magnesium spray but leg don't feel right)   HPI Wendy Serrano presents for leg cramps f/u - mag oil spray helps. F/u on Tinnitus - persistent: pt stopped Lyrica and Gabapentin.2  Outpatient Medications Prior to Visit  Medication Sig Dispense Refill   aspirin 81 MG chewable tablet Chew 81 mg by mouth daily.     b complex vitamins tablet Take 1 tablet by mouth daily. 100 tablet 3   calcium-vitamin D (OSCAL WITH D) 500-200 MG-UNIT per tablet Take 1 tablet by mouth 2 (two) times daily.     donepezil (ARICEPT) 10 MG tablet Take 1 tablet (10 mg total) by mouth at bedtime. 1 tablet at bedtime 90 tablet 3   glucosamine-chondroitin 500-400 MG tablet Take 1 tablet by mouth 2 (two) times daily.     Magnesium 250 MG TABS Take 2 tablets by mouth daily.     meclizine (ANTIVERT) 12.5 MG tablet meclizine 12.5 mg tablet  TAKE 1 TABLET BY MOUTH 3 TIMES DAILY AS NEEDED FOR DIZZINESS.     methotrexate (RHEUMATREX) 2.5 MG tablet   3   Multiple Vitamins-Minerals (OCUVITE ADULT 50+) CAPS Take 1 capsule by mouth daily.     prednisoLONE acetate (PRED FORTE) 1 % ophthalmic suspension Place 1 drop into both eyes daily.     SIMBRINZA 1-0.2 % SUSP Place 1 drop into both eyes 2 (two) times daily.  6   famotidine (PEPCID) 40 MG tablet Take 1 tablet (40 mg total) by mouth daily. (Patient not taking: Reported on 01/06/2022) 90 tablet 3   gabapentin (NEURONTIN) 100 MG capsule Take 2 capsules (200 mg total) by mouth at bedtime. (Patient not taking: Reported on 01/06/2022) 180 capsule 2   methylphenidate (RITALIN) 5 MG tablet One po after brunch (Patient not taking: Reported on 01/06/2022) 30 tablet 0   pantoprazole (PROTONIX) 40 MG tablet  pantoprazole 40 mg tablet,delayed release  TAKE 1 TABLET BY MOUTH EVERY DAY (Patient not taking: Reported on 01/06/2022)     pregabalin (LYRICA) 25 MG capsule Take 1-2 capsules (25-50 mg total) by mouth at bedtime as needed. (Patient not taking: Reported on 01/06/2022) 60 capsule 3   No facility-administered medications prior to visit.    ROS: Review of Systems  Constitutional:  Negative for activity change, appetite change, chills, fatigue and unexpected weight change.  HENT:  Negative for congestion, mouth sores and sinus pressure.   Eyes:  Negative for visual disturbance.  Respiratory:  Negative for cough and chest tightness.   Gastrointestinal:  Negative for abdominal pain and nausea.  Genitourinary:  Negative for difficulty urinating, frequency and vaginal pain.  Musculoskeletal:  Negative for back pain and gait problem.  Skin:  Negative for pallor and rash.  Neurological:  Negative for dizziness, tremors, weakness, numbness and headaches.  Psychiatric/Behavioral:  Negative for confusion and sleep disturbance.     Objective:  BP 118/60 (BP Location: Left Arm)   Pulse 80   Temp 98.2 F (36.8 C) (Oral)   Ht '5\' 8"'$  (1.727 m)   Wt 135 lb 3.2 oz (61.3 kg)   SpO2 98%   BMI 20.56 kg/m   BP Readings from Last 3 Encounters:  01/06/22 118/60  11/04/21  120/68  09/11/21 120/77    Wt Readings from Last 3 Encounters:  01/06/22 135 lb 3.2 oz (61.3 kg)  11/04/21 134 lb (60.8 kg)  09/11/21 135 lb (61.2 kg)    Physical Exam  Lab Results  Component Value Date   WBC 3.9 (L) 06/26/2021   HGB 13.0 06/26/2021   HCT 39.6 06/26/2021   PLT 268.0 06/26/2021   GLUCOSE 88 09/04/2021   CHOL 208 (H) 08/22/2020   TRIG 51.0 08/22/2020   HDL 86.40 08/22/2020   LDLDIRECT 127.9 03/09/2013   LDLCALC 112 (H) 08/22/2020   ALT 22 09/04/2021   AST 27 09/04/2021   NA 142 09/04/2021   K 3.6 09/04/2021   CL 105 09/04/2021   CREATININE 0.86 09/04/2021   BUN 16 09/04/2021   CO2 30 09/04/2021    TSH 5.57 (H) 09/04/2021   INR 1.1 (H) 05/16/2015   HGBA1C 5.4 02/26/2011    No results found.  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 No vertigo AIM Hearing referral      Relevant Orders   Ambulatory referral to Audiology      No orders of the defined types were placed in this encounter.     Follow-up: Return in about 3 months (around 04/08/2022) for a follow-up visit.  Walker Kehr, MD

## 2022-01-25 ENCOUNTER — Other Ambulatory Visit: Payer: Self-pay | Admitting: Internal Medicine

## 2022-02-03 DIAGNOSIS — I739 Peripheral vascular disease, unspecified: Secondary | ICD-10-CM | POA: Diagnosis not present

## 2022-02-03 DIAGNOSIS — I998 Other disorder of circulatory system: Secondary | ICD-10-CM | POA: Insufficient documentation

## 2022-02-03 DIAGNOSIS — M79604 Pain in right leg: Secondary | ICD-10-CM | POA: Diagnosis not present

## 2022-02-03 DIAGNOSIS — M79605 Pain in left leg: Secondary | ICD-10-CM | POA: Diagnosis not present

## 2022-02-10 ENCOUNTER — Ambulatory Visit: Payer: Medicare PPO | Admitting: Internal Medicine

## 2022-02-11 ENCOUNTER — Other Ambulatory Visit: Payer: Self-pay | Admitting: *Deleted

## 2022-02-11 DIAGNOSIS — R252 Cramp and spasm: Secondary | ICD-10-CM

## 2022-02-11 DIAGNOSIS — M79671 Pain in right foot: Secondary | ICD-10-CM

## 2022-02-17 ENCOUNTER — Ambulatory Visit (HOSPITAL_COMMUNITY)
Admission: RE | Admit: 2022-02-17 | Discharge: 2022-02-17 | Disposition: A | Discharge status: Home / Self Care | Payer: Medicare PPO | Source: Ambulatory Visit | Attending: Surgery | Admitting: Surgery

## 2022-02-17 DIAGNOSIS — M79671 Pain in right foot: Secondary | ICD-10-CM | POA: Insufficient documentation

## 2022-02-17 DIAGNOSIS — R252 Cramp and spasm: Secondary | ICD-10-CM | POA: Insufficient documentation

## 2022-02-17 DIAGNOSIS — M79672 Pain in left foot: Secondary | ICD-10-CM | POA: Diagnosis not present

## 2022-02-17 NOTE — Progress Notes (Unsigned)
[facility] [place]  ASSESSMENT / PLAN: 76 y.o. female with right > left plantar numbness / discomfort that is episodic. No evidence of hemodynamically significant vascular disease on clinical exam or non-invasive testing. Patient reassured about findings and encouraged to follow up with PCP or podiatry for further workup. Follow up with me as needed.   CHIEF COMPLAINT: plantar discomfort  HISTORY OF PRESENT ILLNESS: Wendy Serrano is a 76 y.o. female referred to clinic for possible peripheral arterial disease.  The patient reports discomfort in her plantar foot in both legs.  This is episodic and occurs both at rest and with activity.  Patient does not have any symptoms typical of intermittent claudication.  She is able to climb 4 flights of stairs.  She is able to exercise.  She does not describe cramping discomfort in her calves.  She does not have classic symptoms of ischemic rest pain in her feet.  She does not have any ischemic ulceration.  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   Menopausal symptoms    Renal cyst    bilateral   Rotator cuff tear    Yeast infection     Past Surgical History:  Procedure Laterality Date   ROTATOR CUFF REPAIR Left 08/2009   Left shoulder-rotator cuff repair [facility])   WISDOM TOOTH EXTRACTION      Family History  Problem Relation Age of Onset   Prostate cancer Father        died   Pancreatic cancer Mother        died   Diabetes Mother    Hyperlipidemia Sister     Social History   Socioeconomic History    Marital status: Married    Spouse name: [name]   Number of children: 0   Years of education: Not on file   Highest education level: Not on file  Occupational History   Occupation: Retired  Tobacco Use   Smoking status: Never   Smokeless tobacco: Never  Substance and Sexual Activity   Alcohol use: No   Drug use: No   Sexual activity: Never    Birth control/protection: Post-menopausal  Other Topics Concern   Not on file  Social History Narrative   [employer] BA; MEd-teaching;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 They are doing well   Marriage is in good health and retirement is goodEnd-of-life: provided packet of information and forms for consideration (Oct'11)      Lives with husband   Right handed   Caffeine: zero   Social Determinants of Health   Financial Resource Strain: Low Risk  (03/29/2021)   Overall Financial Resource Strain (CARDIA)    Difficulty of Paying Living Expenses: Not hard at all  Food Insecurity: No Food Insecurity (03/29/2021)   Hunger Vital Sign    Worried About Running Out of Food in the Last Year: Never true  Ran Out of Food in the Last Year: Never true  Transportation Needs: No Transportation Needs (03/29/2021)   PRAPARE - Hydrologist (Medical): No    Lack of Transportation (Non-Medical): No  Physical Activity: Insufficiently Active (03/29/2021)   Exercise Vital Sign    Days of Exercise per Week: 3 days    Minutes of Exercise per Session: 30 min  Stress: No Stress Concern Present (03/29/2021)   [place]    Feeling of Stress : Not at all  Social Connections: [place] (03/29/2021)   Social Connection and Isolation Panel [NHANES]    Frequency of Communication with Friends and Family: Twice a week    Frequency of Social Gatherings with  Friends and Family: Twice a week    Attends Religious Services: More than 4 times per year    Active Member of [employer] or Organizations: Yes    Attends Archivist Meetings: 1 to 4 times per year    Marital Status: Married  Human resources officer Violence: Not At Risk (03/29/2021)   Humiliation, Afraid, Rape, and Kick questionnaire    Fear of Current or Ex-Partner: No    Emotionally Abused: No    Physically Abused: No    Sexually Abused: No    No Known Allergies  Current Outpatient Medications  Medication Sig Dispense Refill   aspirin 81 MG chewable tablet Chew 81 mg by mouth daily.     b complex vitamins tablet Take 1 tablet by mouth daily. 100 tablet 3   calcium-vitamin D (OSCAL WITH D) 500-200 MG-UNIT per tablet Take 1 tablet by mouth 2 (two) times daily.     donepezil (ARICEPT) 10 MG tablet TAKE 1/2 TABLET AT BEDTIME FOR 1 MONTH, THEN TAKE 1 TABLET AT BEDTIME THEREAFTER 90 tablet 3   glucosamine-chondroitin 500-400 MG tablet Take 1 tablet by mouth 2 (two) times daily.     Magnesium 250 MG TABS Take 2 tablets by mouth daily.     meclizine (ANTIVERT) 12.5 MG tablet meclizine 12.5 mg tablet  TAKE 1 TABLET BY MOUTH 3 TIMES DAILY AS NEEDED FOR DIZZINESS.     methotrexate (RHEUMATREX) 2.5 MG tablet   3   Multiple Vitamins-Minerals (OCUVITE ADULT 50+) CAPS Take 1 capsule by mouth daily.     prednisoLONE acetate (PRED FORTE) 1 % ophthalmic suspension Place 1 drop into both eyes daily.     SIMBRINZA 1-0.2 % SUSP Place 1 drop into both eyes 2 (two) times daily.  6   No current facility-administered medications for this visit.    PHYSICAL EXAM There were no vitals filed for this visit.  Well-appearing elderly woman in no acute distress Regular rate and rhythm Unlabored breathing Palpable dorsalis pedis pulses bilaterally.  Triphasic Doppler flow heard PT, DP arteries bilaterally.  PERTINENT LABORATORY AND RADIOLOGIC DATA  Most recent CBC    Latest Ref Rng & Units 06/26/2021     9:40 AM 08/22/2020   11:08 AM 06/16/2019   11:25 AM  CBC  WBC 4.0 - 10.5 K/uL 3.9  3.9  4.6   Hemoglobin 12.0 - 15.0 g/dL 13.0  13.4  13.1   Hematocrit 36.0 - 46.0 % 39.6  39.8  39.3   Platelets 150.0 - 400.0 K/uL 268.0  290.0  272.0      Most recent CMP    Latest Ref Rng & Units 09/04/2021    9:47 AM 06/26/2021    9:40 AM 08/22/2020  11:08 AM  CMP  Glucose 70 - 99 mg/dL 88  78  92   BUN 6 - 23 mg/dL '16  16  19   '$ Creatinine 0.40 - 1.20 mg/dL 0.86  0.83  0.85   Sodium 135 - 145 mEq/L 142  143  141   Potassium 3.5 - 5.1 mEq/L 3.6  3.7  3.8   Chloride 96 - 112 mEq/L 105  106  106   CO2 19 - 32 mEq/L '30  29  29   '$ Calcium 8.4 - 10.5 mg/dL 9.7  9.9  10.0   Total Protein 6.0 - 8.3 g/dL 7.2  8.2  7.5   Total Bilirubin 0.2 - 1.2 mg/dL 0.5  0.6  0.6   Alkaline Phos 39 - 117 U/L 79  83  69   AST 0 - 37 U/L '27  30  22   '$ ALT 0 - 35 U/L '22  23  19     '$ Renal function CrCl cannot be calculated (Patient's most recent lab result is older than the maximum 21 days allowed.).  Hgb A1c MFr Bld (%)  Date Value  02/26/2011 5.4    LDL Cholesterol  Date Value Ref Range Status  08/22/2020 112 (H) 0 - 99 mg/dL Final   Direct LDL  Date Value Ref Range Status  03/09/2013 127.9 mg/dL Final    Comment:    Optimal:  <100 mg/dLNear or Above Optimal:  100-129 mg/dLBorderline High:  130-159 mg/dLHigh:  160-189 mg/dLVery High:  >190 mg/dL     +-------+-----------+-----------+------------+------------+  ABI/TBIToday's ABIToday's TBIPrevious ABIPrevious TBI  +-------+-----------+-----------+------------+------------+  Right  1.21       0.79                                 +-------+-----------+-----------+------------+------------+  Left   1.26       0.82                                 +-------+-----------+-----------+------------+------------+   Yevonne Aline. Stanford Breed, MD [facility] Phone Number: [phone] 02/17/2022 7:47 PM  Total time  spent on preparing this encounter including chart review, data review, collecting history, examining the patient, coordinating care for this new patient, 45 minutes.  Portions of this report may have been transcribed using voice recognition software.  Every effort has been made to ensure accuracy; however, inadvertent computerized transcription errors may still be present.

## 2022-02-18 ENCOUNTER — Ambulatory Visit: Payer: Medicare PPO | Admitting: Neurology

## 2022-02-18 ENCOUNTER — Ambulatory Visit: Payer: Medicare PPO | Admitting: Vascular Surgery

## 2022-02-18 ENCOUNTER — Encounter: Payer: Self-pay | Admitting: Vascular Surgery

## 2022-02-18 VITALS — BP 142/84 | HR 78 | Temp 97.8°F | Resp 20 | Ht 68.0 in | Wt 136.0 lb

## 2022-02-18 DIAGNOSIS — M79672 Pain in left foot: Secondary | ICD-10-CM | POA: Diagnosis not present

## 2022-02-18 DIAGNOSIS — M79671 Pain in right foot: Secondary | ICD-10-CM

## 2022-02-24 ENCOUNTER — Telehealth: Payer: Self-pay

## 2022-02-24 NOTE — Telephone Encounter (Signed)
Pt's husband, [name], called stating that the pt had a referral to podiatry. He wanted to know who it was sent to so he could call and make an appt.  Reviewed pt's chart, returned call for clarification, two identifiers used. Informed him that she was already an existing pt with a podiatrist and she should be able to get an appt with that office without a referral and that's why none was sent in. Pt states that she isn't having foot pain, so they wouldn't make an appt for her. Reviewed her last visit notes with the pt when she saw Dr Stanford Breed. Pt states that she has pain in her legs when waking. She uses Magnesium oil on them and the pain goes away. She denies any pain other times of the day or any foot pain. Instructed to call PCP to r/o any other causes. Confirmed understanding.

## 2022-03-06 ENCOUNTER — Ambulatory Visit: Payer: Medicare PPO | Admitting: Internal Medicine

## 2022-03-06 ENCOUNTER — Encounter: Payer: Self-pay | Admitting: Internal Medicine

## 2022-03-06 ENCOUNTER — Ambulatory Visit (INDEPENDENT_AMBULATORY_CARE_PROVIDER_SITE_OTHER): Payer: Medicare PPO

## 2022-03-06 VITALS — BP 122/82 | HR 69 | Temp 98.4°F | Ht 68.0 in | Wt 137.0 lb

## 2022-03-06 DIAGNOSIS — R252 Cramp and spasm: Secondary | ICD-10-CM

## 2022-03-06 DIAGNOSIS — R413 Other amnesia: Secondary | ICD-10-CM | POA: Diagnosis not present

## 2022-03-06 DIAGNOSIS — M79671 Pain in right foot: Secondary | ICD-10-CM

## 2022-03-06 DIAGNOSIS — H9313 Tinnitus, bilateral: Secondary | ICD-10-CM

## 2022-03-06 LAB — COMPREHENSIVE METABOLIC PANEL
ALT: 25 U/L (ref 0–35)
AST: 32 U/L (ref 0–37)
Albumin: 4.5 g/dL (ref 3.5–5.2)
Alkaline Phosphatase: 70 U/L (ref 39–117)
BUN: 22 mg/dL (ref 6–23)
CO2: 29 mEq/L (ref 19–32)
Calcium: 9.8 mg/dL (ref 8.4–10.5)
Chloride: 105 mEq/L (ref 96–112)
Creatinine, Ser: 0.88 mg/dL (ref 0.40–1.20)
GFR: 63.34 mL/min (ref >60.00)
Glucose, Bld: 83 mg/dL (ref 70–99)
Potassium: 3.8 mEq/L (ref 3.5–5.1)
Sodium: 141 mEq/L (ref 135–145)
Total Bilirubin: 0.4 mg/dL (ref 0.2–1.2)
Total Protein: 7.5 g/dL (ref 6.0–8.3)

## 2022-03-06 LAB — CBC WITH DIFFERENTIAL/PLATELET
Basophils Absolute: 0 10*3/uL (ref 0.0–0.1)
Basophils Relative: 0.8 % (ref 0.0–3.0)
Eosinophils Absolute: 0.1 10*3/uL (ref 0.0–0.7)
Eosinophils Relative: 2.9 % (ref 0.0–5.0)
HCT: 40.2 % (ref 36.0–46.0)
Hemoglobin: 13.4 g/dL (ref 12.0–15.0)
Lymphocytes Relative: 22.8 % (ref 12.0–46.0)
Lymphs Abs: 0.8 10*3/uL (ref 0.7–4.0)
MCHC: 33.3 g/dL (ref 30.0–36.0)
MCV: 95.3 fl (ref 78.0–100.0)
Monocytes Absolute: 0.4 10*3/uL (ref 0.1–1.0)
Monocytes Relative: 11.2 % (ref 3.0–12.0)
Neutro Abs: 2.2 10*3/uL (ref 1.4–7.7)
Neutrophils Relative %: 62.3 % (ref 43.0–77.0)
Platelets: 254 10*3/uL (ref 150.0–400.0)
RBC: 4.22 Mil/uL (ref 3.87–5.11)
RDW: 14 % (ref 11.5–15.5)
WBC: 3.6 10*3/uL — ABNORMAL LOW (ref 4.0–10.5)

## 2022-03-06 LAB — TSH: TSH: 9.42 u[IU]/mL — ABNORMAL HIGH (ref 0.35–5.50)

## 2022-03-06 LAB — T4, FREE: Free T4: 0.65 ng/dL (ref 0.60–1.60)

## 2022-03-06 MED ORDER — PREGABALIN 25 MG PO CAPS: 25.0000 mg | ORAL_CAPSULE | Freq: Every day | ORAL | 3 refills | Status: DC

## 2022-03-06 MED ORDER — MEMANTINE HCL 5 MG PO TABS: 5.0000 mg | ORAL_TABLET | Freq: Two times a day (BID) | ORAL | 5 refills | Status: DC

## 2022-03-06 NOTE — Assessment & Plan Note (Signed)
Wendy Serrano is very concerned. I do not see any change. Cont w/Aricept. We added Namenda.

## 2022-03-06 NOTE — Assessment & Plan Note (Addendum)
Chronic Options discussed - f/u Dr Redmond Baseman, [facility], meds, [facility], do nothing... OK to stop pregabalin

## 2022-03-06 NOTE — Progress Notes (Signed)
Subjective:  Patient ID: Wendy Serrano, female    DOB: 1946-05-05  Age: 76 y.o. 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     b complex vitamins tablet Take 1 tablet by mouth daily. 100 tablet 3   calcium-vitamin D (OSCAL WITH D) 500-200 MG-UNIT per tablet Take 1 tablet by mouth 2 (two) times daily.     donepezil (ARICEPT) 10 MG tablet TAKE 1/2 TABLET AT BEDTIME FOR 1 MONTH, THEN TAKE 1 TABLET AT BEDTIME THEREAFTER 90 tablet 3   glucosamine-chondroitin 500-400 MG tablet Take 1 tablet by mouth 2 (two) times daily.     Magnesium 250 MG TABS Take 2 tablets by mouth daily.     meclizine (ANTIVERT) 12.5 MG tablet meclizine 12.5 mg tablet  TAKE 1 TABLET BY MOUTH 3 TIMES DAILY AS NEEDED FOR DIZZINESS.     methotrexate (RHEUMATREX) 2.5 MG tablet   3   Multiple Vitamins-Minerals (OCUVITE ADULT 50+) CAPS Take 1 capsule by mouth daily.     prednisoLONE acetate (PRED FORTE) 1 % ophthalmic suspension Place 1 drop into both eyes daily.     SIMBRINZA 1-0.2 % SUSP Place 1 drop into both eyes 2 (two) times daily.  6   No facility-administered medications prior to visit.    ROS: Review of Systems  Constitutional:  Negative for activity change, appetite change, chills, fatigue and unexpected weight change.  HENT:  Positive for tinnitus. Negative for congestion, mouth sores and sinus pressure.   Eyes:  Negative for visual disturbance.  Respiratory:  Negative for cough and chest tightness.   Gastrointestinal:  Negative for abdominal pain and nausea.  Genitourinary:  Negative for difficulty urinating, frequency and vaginal pain.  Musculoskeletal:  Positive for arthralgias. Negative for back pain and gait problem.  Skin:  Negative for  pallor and rash.  Neurological:  Negative for dizziness, tremors, weakness, numbness and headaches.  Psychiatric/Behavioral:  Positive for decreased concentration. Negative for confusion, sleep disturbance and suicidal ideas.     Objective:  BP 122/82 (BP Location: Left Arm)   Pulse 69   Temp 98.4 F (36.9 C) (Oral)   Ht '5\' 8"'$  (1.727 m)   Wt 137 lb (62.1 kg)   SpO2 99%   BMI 20.83 kg/m   BP Readings from Last 3 Encounters:  03/06/22 122/82  02/18/22 (!) 142/84  01/06/22 118/60    Wt Readings from Last 3 Encounters:  03/06/22 137 lb (62.1 kg)  02/18/22 136 lb (61.7 kg)  01/06/22 135 lb 3.2 oz (61.3 kg)    Physical Exam Constitutional:      General: She is not in acute distress.    Appearance: Normal appearance. She is well-developed.  HENT:     Head: Normocephalic.     Right Ear: External ear normal.     Left Ear: External ear normal.     Nose: Nose normal.  Eyes:     General:        Right eye: No discharge.        Left eye: No discharge.     Conjunctiva/sclera: Conjunctivae normal.     Pupils: Pupils are equal, round, and reactive to light.  Neck:     Thyroid: No  thyromegaly.     Vascular: No JVD.     Trachea: No tracheal deviation.  Cardiovascular:     Rate and Rhythm: Normal rate and regular rhythm.     Heart sounds: Normal heart sounds.  Pulmonary:     Effort: No respiratory distress.     Breath sounds: No stridor. No wheezing.  Abdominal:     General: Bowel sounds are normal. There is no distension.     Palpations: Abdomen is soft. There is no mass.     Tenderness: There is no abdominal tenderness. There is no guarding or rebound.  Musculoskeletal:        General: No tenderness.     Cervical back: Normal range of motion and neck supple. No rigidity.  Lymphadenopathy:     Cervical: No cervical adenopathy.  Skin:    Findings: No erythema or rash.  Neurological:     Cranial Nerves: No cranial nerve deficit.     Motor: No abnormal muscle tone.      Coordination: Coordination normal.     Deep Tendon Reflexes: Reflexes normal.  Psychiatric:        Behavior: Behavior normal.        Thought Content: Thought content normal.        Judgment: Judgment normal.   Forgetful  R foot is sensitive  Lab Results  Component Value Date   WBC 3.9 (L) 06/26/2021   HGB 13.0 06/26/2021   HCT 39.6 06/26/2021   PLT 268.0 06/26/2021   GLUCOSE 88 09/04/2021   CHOL 208 (H) 08/22/2020   TRIG 51.0 08/22/2020   HDL 86.40 08/22/2020   LDLDIRECT 127.9 03/09/2013   LDLCALC 112 (H) 08/22/2020   ALT 22 09/04/2021   AST 27 09/04/2021   NA 142 09/04/2021   K 3.6 09/04/2021   CL 105 09/04/2021   CREATININE 0.86 09/04/2021   BUN 16 09/04/2021   CO2 30 09/04/2021   TSH 5.57 (H) 09/04/2021   INR 1.1 (H) 05/16/2015   HGBA1C 5.4 02/26/2011    VAS [place] ABI WITH/WO TBI  Result Date: 02/17/2022  LOWER EXTREMITY DOPPLER STUDY Patient Name:  Wendy Serrano  Date of Exam:   02/17/2022 Medical Rec #: [id number]        Accession #:    [id number] Date of Birth: 26-Nov-1945        Patient Gender: F Patient Age:   19 years Exam Location:  [facility] Procedure:      VAS [place] ABI WITH/WO TBI Referring Phys: --------------------------------------------------------------------------------  Indications: Patient reports right leg doesn't feel normal. Episodes of numbness              to the bottom of the foot. Primary reports poor pulsese on exam. High Risk Factors: None.  Performing Technologist: Ronal Fear RVS, RCS  Examination Guidelines: A complete evaluation includes at minimum, Doppler waveform signals and systolic blood pressure reading at the level of bilateral brachial, anterior tibial, and posterior tibial arteries, when vessel segments are accessible. Bilateral testing is considered an integral part of a complete examination. Photoelectric Plethysmograph (PPG) waveforms and toe systolic pressure readings are included as required and additional duplex  testing as needed. Limited examinations for reoccurring indications may be performed as noted.  ABI Findings: +---------+------------------+-----+---------+--------+ Right    Rt Pressure (mmHg)IndexWaveform Comment  +---------+------------------+-----+---------+--------+ Brachial 125                                      +---------+------------------+-----+---------+--------+  PTA      133               1.06 triphasic         +---------+------------------+-----+---------+--------+ DP       151               1.21 triphasic         +---------+------------------+-----+---------+--------+ Great Toe99                0.79                   +---------+------------------+-----+---------+--------+ +---------+------------------+-----+---------+-------+ Left     Lt Pressure (mmHg)IndexWaveform Comment +---------+------------------+-----+---------+-------+ Brachial 122                                     +---------+------------------+-----+---------+-------+ PTA      153               1.22 triphasic        +---------+------------------+-----+---------+-------+ DP       158               1.26 triphasic        +---------+------------------+-----+---------+-------+ Great Toe103               0.82                  +---------+------------------+-----+---------+-------+ +-------+-----------+-----------+------------+------------+ ABI/TBIToday's ABIToday's TBIPrevious ABIPrevious TBI +-------+-----------+-----------+------------+------------+ Right  1.21       0.79                                +-------+-----------+-----------+------------+------------+ Left   1.26       0.82                                +-------+-----------+-----------+------------+------------+   Summary: Right: Resting right ankle-brachial index is within normal range. The right toe-brachial index is normal. Left: Resting left ankle-brachial index is within normal range. The left  toe-brachial index is normal. *See table(s) above for measurements and observations.  Electronically signed by Harold Barban MD on 02/17/2022 at 3:18:03 PM.    Final     Assessment & Plan:   Problem List Items Addressed This Visit     Cramps, extremity    Use sneakers in the house - 4 floors, taking stairs Use Mag oil spray On Pregabalin      Foot pain, right    R foot pain ?OA X ray Topical meds      Leg cramps    On Pregabalin 25 mg at hs      Relevant Orders   CBC with Differential/Platelet   Comprehensive metabolic panel   TSH   T4, free   Memory difficulties    Kathern is very concerned. I do not see any change. Cont w/Aricept. We added Namenda.      Relevant Orders   CBC with Differential/Platelet   Comprehensive metabolic panel   TSH   T4, free   Tinnitus    Chronic Options discussed - f/u Dr Redmond Baseman, [facility], meds, [facility], do nothing... OK to stop pregabalin      Relevant Orders   CBC with Differential/Platelet   Comprehensive metabolic panel   TSH   T4, free      Meds ordered this  encounter  Medications   pregabalin (LYRICA) 25 MG capsule    Sig: Take 1-2 capsules (25-50 mg total) by mouth at bedtime.    Dispense:  90 capsule    Refill:  3   memantine (NAMENDA) 5 MG tablet    Sig: Take 1 tablet (5 mg total) by mouth 2 (two) times daily.    Dispense:  60 tablet    Refill:  5      Follow-up: Return in about 3 months (around 06/06/2022) for a follow-up visit.  Walker Kehr, MD

## 2022-03-06 NOTE — Assessment & Plan Note (Signed)
Use sneakers in the house - 4 floors, taking stairs Use Mag oil spray On Pregabalin

## 2022-03-06 NOTE — Assessment & Plan Note (Addendum)
R foot pain ?OA X ray Topical meds

## 2022-03-06 NOTE — Assessment & Plan Note (Signed)
On Pregabalin 25 mg at hs

## 2022-03-10 ENCOUNTER — Ambulatory Visit: Payer: Medicare PPO | Admitting: Internal Medicine

## 2022-03-12 ENCOUNTER — Encounter: Payer: Self-pay | Admitting: Podiatry

## 2022-03-12 ENCOUNTER — Ambulatory Visit: Payer: Medicare PPO | Admitting: Podiatry

## 2022-03-12 DIAGNOSIS — L84 Corns and callosities: Secondary | ICD-10-CM

## 2022-03-12 DIAGNOSIS — G629 Polyneuropathy, unspecified: Secondary | ICD-10-CM | POA: Diagnosis not present

## 2022-03-12 DIAGNOSIS — M2011 Hallux valgus (acquired), right foot: Secondary | ICD-10-CM

## 2022-03-12 NOTE — Progress Notes (Signed)
Subjective:   Patient ID: Wendy Serrano, female   DOB: 77 y.o.   MRN: [id number]   HPI Patient presents with several problems with 1 being bunion deformity discomfort bilateral and weakness in her right foot that is been over the last couple months with numbness and she just wanted to make sure nothing else was going on   ROS      Objective:  Physical Exam  Neurovascular status unchanged with sharp dull vibratory found to be intact muscle strength found to be adequate and upon gait analysis no slapping of the foot.  Patient is found to have structural bunion deformity bilateral with redness around the first metatarsal head with only mild discomfort     Assessment:  Possibility for issue with back or some kind of nerve compression right but has not progressed to a level I would be too concerned about it and hopefully will go away over time with structural bunion     Plan:  Reviewed that if she should get any worsening of the condition right I would recommend neurologist for nerve conduction studies EMGs but if it does not progress or gradually gets better she does not need to do that.  Do not recommend bunion correction currently but did review causes of this deformity

## 2022-03-13 ENCOUNTER — Ambulatory Visit: Payer: Medicare PPO | Admitting: Neurology

## 2022-03-13 VITALS — BP 147/76 | HR 78 | Ht 67.0 in | Wt 139.0 lb

## 2022-03-13 DIAGNOSIS — F09 Unspecified mental disorder due to known physiological condition: Secondary | ICD-10-CM | POA: Diagnosis not present

## 2022-03-13 DIAGNOSIS — G3184 Mild cognitive impairment, so stated: Secondary | ICD-10-CM

## 2022-03-13 MED ORDER — MEMANTINE HCL 10 MG PO TABS: 10.0000 mg | ORAL_TABLET | Freq: Two times a day (BID) | ORAL | 5 refills | Status: DC

## 2022-03-13 NOTE — Patient Instructions (Signed)

## 2022-03-13 NOTE — Progress Notes (Signed)
Contacted pt, she stated she has been having some discomfort on L side of head, also having touch sensitivity and wants to see Dr Brett Fairy. Please make appt  .      Provider:  Larey Seat, M D  Referring Provider: [name], MD Primary Care Physician:  [name], MD  Chief Complaint  Patient presents with   Follow-up    Pt in room # 11 and alone. Pt here today for f/u memory loss.    HPI:  c Wendy Serrano is a 76 y.o.  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 Jannifer Serrano. Mrs. Wendy Serrano had reported memory troubles in the past and  today on 13 March 2022 we are testing her by Mini-Mental status exam.  She had used over-the-counter supplements in the past such as lying main mushroom and Prevagen.  In the meantime she has started on Namenda and is taking currently 5 mg twice a day.  Namenda for person of her body mass index should slowly be increased to 10 mg twice daily and I will be happy to change the prescription from 5-10 today.  Her BMI is 21.7, she weighs today in clothing 139 pounds, her blood pressure was 147/76, heart rate was not noted.  Her repeat memory test today showed 25 out of 30 points which is considered between a mild cognitive impairment and an early possible dementia.  I like to add that the deficits in this memory test were in the serial 7 math category and I can replace this with the spelling of the word backwards as an equivalent score.  She had no trouble writing a sentence copying an image, and on the 3 word recall she scored 2 which is actually pretty good. So replacing the serial 7 test she scores a total of 28 out of 30 points and this is in normal range.  The patient was a professor for language arts and she states that she was never good with math.  She  mentioned a buzzing sound in the left ear, tinnitus. No scalp sensation abnormality.      .  09-11-2021: She is a primary patient of Dr Alain Marion . She is here for a follow up w visit on memory care. She had just done a MMSE with my CMA and forgot to place the hands of the clock into the drawing.  Mr Jarema reports his wife is tuning people out - she doesn't process what she is told, comprehension is declining. She is taking lion mane mushroom extract and Prevagen - for almost a year. Gabapentin has improved dyseasthesias and she sleeps better.   04-29-2021: Followed by Dr. Jannifer Serrano at [facility] for cognitive disorder, tinnitus, had 2 recent visits in ENT, PCP and last night ER-  Area of dyseasthesias , coin sized, crown of the head.  Gabapentin decreased sensation and she sleeps well. Tinnitus not bothering now.   The patient's husband felt that she also has some memory impairment today she was tested by Mini-Mental status exam and she scored only 22 out of 30 points which is concerning.  The only orientation question she missed was the date but she was clear about the day of the week month year or season.  She knew fully which places and location she is at, she had trouble with the serial sevens and missed step 3. She was able to spell backwards- I  gave her 5 points, she was able to copy an image and draw a clock face.     HPI Wendy Serrano presents for C/o "frying sounds" in the L ear x 2 weeks - better. C/o sensation on the L head - hypersensitive to touch. She went to ED last night. Has difficulties to explain this sensation- a bubbling or soda pop feeling under the scalp, no numbness.  ENT appt did not bring her closer to a solution, the physician felt it was not tinnitus related.  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 She uses eye drops. She has alopecia, a hereditary trait, 2002.       Review of Systems:  She made  me aware she sleeps well, her Apetite has not changed; a short sleeper- she gets to bed at 4.30 Am and gets up at 10.30- always has been a night owl, ever since college.  She had Covid at Easter 2021, and just sailed through. She just got infected again, 04-20-2021, now tested negative. No major symptoms, like a head cold. Went to ED and got the po medication and recovered quickly. .  Out of a complete 14 system review, the patient complains of only the following symptoms, and all other reviewed systems are negative.  College graduate- was not given MOCA- (?)   mini-mental status exam: 25 / 30 points .  Replacing math part with WORLD spelling, she scored 28/ 30 points.  World - 5 points , drawing 1 points. Immediate recall was 2 out of 3 words.        09/11/2021    2:34 PM 04/29/2021   10:21 AM 11/01/2020   10:04 AM 05/03/2020   10:00 AM 11/09/2018   10:35 AM 03/29/2018    8:40 AM  MMSE - Mini Mental State Exam  Orientation to time '5 4 4 5 4 5  '$ Orientation to Place '5 5 5 5 5 5  '$ Registration '3 3 3 3 3 3  '$ Attention/ Calculation  WORLD  2/ 5    5/5 on spelling  '2 1 5 5 5  '$ Recall '1 1 1 2 1 '$ 0  Language- name 2 objects '2 2 2 2 2 2  '$ Language- repeat 0 0 '1 1 1 1  '$ Language- follow 3 step command '3 3 3 3 3 3  '$ Language- read & follow direction '1 1 1 1 1 1  '$ Write a sentence '1 1 1 1 1 1  '$ Copy design 1 0 1 1 0 1  Total score '28 22 23 29 26 27       '$ No data to display            Social History   Socioeconomic History   Marital status: Married    Spouse name: [name]   Number of children: 0   Years of education: Not on file   Highest education level: Not on file  Occupational History   Occupation: Retired  Tobacco Use   Smoking status: Never   Smokeless tobacco: Never  Substance and Sexual Activity   Alcohol use: No   Drug use: No   Sexual activity: Never    Birth control/protection: Post-menopausal  Other Topics Concern   Not on file  Social History Narrative   [employer]  college BA; MEd-teaching;MEd Administration @ [employer]ried '67   Has  11 children who were foster care or mentoredRetiredSO retired, Has had some health problems. They are doing well   Marriage is in good health and retirement is goodEnd-of-life: provided packet of information and forms for consideration (Oct'11)      Lives with husband   Right handed   Caffeine: zero   Social Determinants of Health   Financial Resource Strain: Low Risk  (03/29/2021)   Overall Financial Resource Strain (CARDIA)    Difficulty of Paying Living Expenses: Not hard at all  Food Insecurity: No Food Insecurity (03/29/2021)   Hunger Vital Sign    Worried About Running Out of Food in the Last Year: Never true    [place] in the Last Year: Never true  Transportation Needs: No Transportation Needs (03/29/2021)   PRAPARE - Hydrologist (Medical): No    Lack of Transportation (Non-Medical): No  Physical Activity: Insufficiently Active (03/29/2021)   Exercise Vital Sign    Days of Exercise per Week: 3 days    Minutes of Exercise per Session: 30 min  Stress: No Stress Concern Present (03/29/2021)   [place]    Feeling of Stress : Not at all  Social Connections: Socially Integrated (03/29/2021)   Social Connection and Isolation Panel [NHANES]    Frequency of Communication with Friends and Family: Twice a week    Frequency of Social Gatherings with Friends and Family: Twice a week    Attends Religious Services: More than 4 times per year    Active Member of [employer] or Organizations: Yes    Attends Archivist Meetings: 1 to 4 times per year    Marital Status: Married  Human resources officer Violence: Not At Risk (03/29/2021)   Humiliation, Afraid, Rape, and Kick questionnaire    Fear of Current or Ex-Partner: No    Emotionally Abused: No    Physically Abused: No    Sexually Abused: No    Family History   Problem Relation Age of Onset   Prostate cancer Father        died   Pancreatic cancer Mother        died   Diabetes Mother    Hyperlipidemia Sister     Past Medical History:  Diagnosis Date   Allergic rhinitis    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   Menopausal symptoms    Renal cyst    bilateral   Rotator cuff tear    Yeast infection     Past Surgical History:  Procedure Laterality Date   ROTATOR CUFF REPAIR Left 08/2009   Left shoulder-rotator cuff repair [facility])   WISDOM TOOTH EXTRACTION      Current Outpatient Medications  Medication Sig Dispense Refill   aspirin 81 MG chewable tablet Chew 81 mg by mouth daily.     b complex vitamins tablet Take 1 tablet by mouth daily. 100 tablet 3   calcium-vitamin D (OSCAL WITH D) 500-200 MG-UNIT per tablet Take 1 tablet by mouth  2 (two) times daily.     donepezil (ARICEPT) 10 MG tablet TAKE 1/2 TABLET AT BEDTIME FOR 1 MONTH, THEN TAKE 1 TABLET AT BEDTIME THEREAFTER 90 tablet 3   glucosamine-chondroitin 500-400 MG tablet Take 1 tablet by mouth 2 (two) times daily.     Magnesium 250 MG TABS Take 2 tablets by mouth daily.     meclizine (ANTIVERT) 12.5 MG tablet meclizine 12.5 mg tablet  TAKE 1 TABLET BY MOUTH 3 TIMES DAILY AS NEEDED FOR DIZZINESS.     memantine (NAMENDA) 5 MG tablet Take 1 tablet (5 mg total) by mouth 2 (two) times daily. 60 tablet 5   methotrexate (RHEUMATREX) 2.5 MG tablet   3   Multiple Vitamins-Minerals (OCUVITE ADULT 50+) CAPS Take 1 capsule by mouth daily.     prednisoLONE acetate (PRED FORTE) 1 % ophthalmic suspension Place 1 drop into both eyes daily.      SIMBRINZA 1-0.2 % SUSP Place 1 drop into both eyes 2 (two) times daily.  6   pregabalin (LYRICA) 25 MG capsule Take 1-2 capsules (25-50 mg total) by mouth at bedtime. (Patient not taking: Reported on 03/13/2022) 90 capsule 3   No current facility-administered medications for this visit.    Allergies as of 03/13/2022   (No Known Allergies)   CT HEAD WITHOUT CONTRAST    IMPRESSION: Generalized atrophy and chronic microvascular ischemia without acute intracranial abnormality.     Ulyses Jarred M.D.   On: 03/28/2021 03:42  This MRI of the brain without contrast shows the following: 1.   Mild generalized cortical atrophy, probably normal for age and unchanged compared to the 2019 MRI. 2.   Scattered T2/FLAIR hyperintense single and confluent foci predominantly in the hemispheres and a few foci in the pons and cerebellum.  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  None of the foci appear to be acute and there is no progression compared to the 2019 MRI. 3.   No acute findings.  05-13-20 Richard A. Felecia Shelling, MD, PhD, Charlynn Grimes   Vitals: BP (!) 147/76 (BP Location: Left Arm, Patient Position: Sitting, Cuff Size: Normal)   Pulse 78   Ht '5\' 7"'$  (1.702 m)   Wt 139 lb (63 kg)   BMI 21.77 kg/m  Last Weight:  Wt Readings from Last 1 Encounters:  03/13/22 139 lb (63 kg)   Last Height:   Ht Readings from Last 1 Encounters:  03/13/22 '5\' 7"'$  (1.702 m)    Physical exam:  General: The patient is awake, alert and appears not in acute distress. The patient is well groomed. Head: Normocephalic, atraumatic. Neck is supple.  Cardiovascular:  Regular rate and rhythm, without  murmurs or carotid bruit, and without distended neck veins. Respiratory: Lungs are clear to auscultation. Skin:  Without evidence of edema, or rash Trunk: BMI is 21.7 kg/ m2.    Neurologic exam : The patient is awake and alert, oriented to place and time.  Memory subjective  described as  impaired - see serial MMSE,  but I like [place] testing for a college graduate .  There is a normal attention span & concentration ability. Speech is fluent without dysarthria, dysphonia or aphasia.  She does have problems with word generation( animals).  Mood and affect are slightly anxious.   Cranial nerves: Pupils are equal and briskly reactive to light. Funduscopic exam with evidence of pallor. Extraocular movements  in vertical and horizontal planes intact and without nystagmus.  Visual fields by finger perimetry  are intact. Hearing to finger rub intact.   Facial sensation intact to fine touch.   Testing by Q-tip revealed the same sensation for fine touch at the cheekbone, forehead, eyebrow region, temple region and retroauricular.  I also tested the Weber transmission and there is no impairment for air or bone-conduction. Facial motor strength is symmetric and tongue and uvula move midline. Tongue protrusion into either cheek is normal. Shoulder shrug is normal.   Motor exam:   Normal tone ,muscle bulk and symmetric  strength in all extremities.  Sensory:  Fine touch and vibration were intact , tested in all extremities. Proprioception was normal.  Coordination: Rapid alternating movements in the fingers/hands were normal. Finger-to-nose maneuver  normal without evidence of ataxia, dysmetria or tremor.  Gait and station: Patient walks without assistive device  Proximal and distal strength within normal limits.  Stance is stable.  Deep tendon reflexes: in the  upper and lower extremities are symmetric and intact.   Assessment:  After physical and neurologic examination, review of laboratory studies, imaging, neurophysiology testing and pre-existing records, assessment is that of :  MCI- stable for years- slight decline- I would like to investigate a one time MOCA. MMSE is again 25 or 28/30 , when replacing math with spelling. 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   Low WBC count was seen on labs from 03-06-2022.    Plan:  Treatment plan and additional workup :  I like to increase Namenda generic to 10 mg bid from 5 mg Bid, its usually meant to be used for moderate dementia, a condition she does not have. The patient will finish the 5 mg bid dosing at 04-07-2022, then increase to 10 mg bid for which I wrote a script today.  I encouraged her to drive in daytime, in good visibility and residential - I want her not to unlearn how to drive.  Encourage mental exercise, crossword puzzles, soduko, card games, chess, [place]. .   I like for her to continue aricept at 10 mg a day  - test MOCA again in 6-8 months with  NP -  Methotrexate to continue with [facility] specialist, 7 tablets MTX on Saturadays only,  taking folic acid.    Rv in 6-8 months with NP Olegario Messier,   PLEASE USE MOCA : WORLD spelling for math  , try [place] first. .   Larey Seat MD 03/13/2022

## 2022-03-21 DIAGNOSIS — L089 Local infection of the skin and subcutaneous tissue, unspecified: Secondary | ICD-10-CM | POA: Diagnosis not present

## 2022-03-25 ENCOUNTER — Telehealth: Payer: Self-pay | Admitting: Internal Medicine

## 2022-03-25 NOTE — Telephone Encounter (Signed)
Left message for patient to call back and schedule Medicare Annual Wellness Visit (AWV).   Please offer to do virtually or by telephone.   Last AWV:03/29/2021   Please schedule at anytime with [place] Advisor schedule   45 minute appointent  If any questions, please contact me at [phone]

## 2022-03-28 DIAGNOSIS — Z961 Presence of intraocular lens: Secondary | ICD-10-CM | POA: Diagnosis not present

## 2022-03-28 DIAGNOSIS — H35363 Drusen (degenerative) of macula, bilateral: Secondary | ICD-10-CM | POA: Diagnosis not present

## 2022-03-28 DIAGNOSIS — H31092 Other chorioretinal scars, left eye: Secondary | ICD-10-CM | POA: Diagnosis not present

## 2022-03-28 DIAGNOSIS — H35351 Cystoid macular degeneration, right eye: Secondary | ICD-10-CM | POA: Diagnosis not present

## 2022-03-28 DIAGNOSIS — H35372 Puckering of macula, left eye: Secondary | ICD-10-CM | POA: Diagnosis not present

## 2022-03-28 DIAGNOSIS — Z79899 Other long term (current) drug therapy: Secondary | ICD-10-CM | POA: Diagnosis not present

## 2022-03-28 DIAGNOSIS — H209 Unspecified iridocyclitis: Secondary | ICD-10-CM | POA: Diagnosis not present

## 2022-03-31 ENCOUNTER — Ambulatory Visit: Payer: Medicare PPO | Admitting: Neurology

## 2022-04-02 ENCOUNTER — Ambulatory Visit: Payer: Medicare PPO | Admitting: Neurology

## 2022-04-08 ENCOUNTER — Ambulatory Visit (INDEPENDENT_AMBULATORY_CARE_PROVIDER_SITE_OTHER): Payer: Medicare PPO | Admitting: *Deleted

## 2022-04-08 ENCOUNTER — Other Ambulatory Visit: Payer: Self-pay | Admitting: Neurology

## 2022-04-08 DIAGNOSIS — Z0000 Encounter for general adult medical examination without abnormal findings: Secondary | ICD-10-CM | POA: Diagnosis not present

## 2022-04-08 NOTE — Patient Instructions (Signed)

## 2022-04-08 NOTE — Progress Notes (Cosign Needed)
Subjective:   Wendy Serrano is a 76 y.o. female who presents for Medicare Annual (Subsequent) preventive examination. I connected with  Sullivan Lone on 04/08/22 by a audio enabled telemedicine application and verified that I am speaking with the correct person using two identifiers.  Patient Location: Home  Provider Location: Home Office  I discussed the limitations of evaluation and management by telemedicine. The patient expressed understanding and agreed to proceed.  Review of Systems    Deferred to PCP Cardiac Risk Factors include: advanced age (>51mn, >>21women)     Objective:    Today's Vitals   04/08/22 1545  PainSc: 1    There is no height or weight on file to calculate BMI.     04/08/2022    3:59 PM 04/19/2021   10:13 AM 03/29/2021    2:43 PM 03/28/2021    2:47 AM 12/07/2019    3:59 PM 06/01/2015   11:40 AM 02/11/2014    6:18 PM  Advanced Directives  Does Patient Have a Medical Advance Directive? No No Yes No Yes No No  Type of AScientist, physiologicalof [place]ums [place]Living will  [place] will    Does patient want to make changes to medical advance directive?     No - Patient declined    Copy of [place]?   No - copy requested  No - copy requested    Would patient like information on creating a medical advance directive? No - Patient declined     Yes - Educational materials given No - patient declined information    Current Medications (verified) Outpatient Encounter Medications as of 04/08/2022  Medication Sig   aspirin 81 MG chewable tablet Chew 81 mg by mouth daily.   b complex vitamins tablet Take 1 tablet by mouth daily.   calcium-vitamin D (OSCAL WITH D) 500-200 MG-UNIT per tablet Take 1 tablet by mouth 2 (two) times daily.   donepezil (ARICEPT) 10 MG tablet TAKE 1/2 TABLET AT BEDTIME FOR 1 MONTH, THEN TAKE 1 TABLET AT BEDTIME THEREAFTER   glucosamine-chondroitin 500-400 MG tablet Take  1 tablet by mouth 2 (two) times daily.   Magnesium 250 MG TABS Take 2 tablets by mouth daily.   meclizine (ANTIVERT) 12.5 MG tablet meclizine 12.5 mg tablet  TAKE 1 TABLET BY MOUTH 3 TIMES DAILY AS NEEDED FOR DIZZINESS.   memantine (NAMENDA) 10 MG tablet Take 1 tablet (10 mg total) by mouth 2 (two) times daily.   methotrexate (RHEUMATREX) 2.5 MG tablet    Multiple Vitamins-Minerals (OCUVITE ADULT 50+) CAPS Take 1 capsule by mouth daily.   prednisoLONE acetate (PRED FORTE) 1 % ophthalmic suspension Place 1 drop into both eyes daily.   pregabalin (LYRICA) 25 MG capsule Take 1-2 capsules (25-50 mg total) by mouth at bedtime.   SIMBRINZA 1-0.2 % SUSP Place 1 drop into both eyes 2 (two) times daily.   No facility-administered encounter medications on file as of 04/08/2022.    Allergies (verified) Patient has no known allergies.   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 difficile diarrhea 06/2017   Cervicogenic headache 09/21/2019   Eczema    childhood   H/O osteopenia 01/09/04   H/O osteoporosis 2008   H/O varicella    Herpes simplex without mention of complication    uncomplicated   History of measles, mumps, or rubella  Leukopenia    chronic   Liver hemangioma    Macular degeneration oct '11   early and mild and stable   Menopausal symptoms    Renal cyst    bilateral   Rotator cuff tear    Yeast infection    Past Surgical History:  Procedure Laterality Date   ROTATOR CUFF REPAIR Left 08/2009   Left shoulder-rotator cuff repair [facility])   WISDOM TOOTH EXTRACTION     Family History  Problem Relation Age of Onset   Prostate cancer Father        died   Pancreatic cancer Mother        died   Diabetes Mother    Hyperlipidemia Sister    Social History   Socioeconomic History   Marital status: Married    Spouse name: [name]   Number of children: 0   Years of education: college   Highest education level: Not  on file  Occupational History   Occupation: Retired  Tobacco Use   Smoking status: Never   Smokeless tobacco: Never  Vaping Use   Vaping Use: Never used  Substance and Sexual Activity   Alcohol use: No   Drug use: No   Sexual activity: Never    Birth control/protection: Post-menopausal  Other Topics Concern   Not on file  Social History Narrative   [employer] BA; MEd-teaching;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 They are doing well   Marriage is in good health and retirement is goodEnd-of-life: provided packet of information and forms for consideration (Oct'11)      Lives with husband   Right handed   Caffeine: zero   Social Determinants of Health   Financial Resource Strain: Low Risk  (04/08/2022)   Overall Financial Resource Strain (CARDIA)    Difficulty of Paying Living Expenses: Not hard at all  Food Insecurity: No Food Insecurity (04/08/2022)   Hunger Vital Sign    Worried About Running Out of Food in the Last Year: Never true    [place] in the Last Year: Never true  Transportation Needs: No Transportation Needs (04/08/2022)   PRAPARE - Hydrologist (Medical): No    Lack of Transportation (Non-Medical): No  Physical Activity: Insufficiently Active (04/08/2022)   Exercise Vital Sign    Days of Exercise per Week: 3 days    Minutes of Exercise per Session: 30 min  Stress: No Stress Concern Present (04/08/2022)   [place]    Feeling of Stress : Not at all  Social Connections: [place] (04/08/2022)   Social Connection and Isolation Panel [NHANES]    Frequency of Communication with Friends and Family: More than three times a week    Frequency of Social Gatherings with Friends and Family: More than three times a week    Attends Religious Services: More  than 4 times per year    Active Member of [employer] or Organizations: Yes    Attends Archivist Meetings: 1 to 4 times per year    Marital Status: Married    Tobacco Counseling Counseling given: Not Answered   Clinical Intake:  Pre-visit preparation completed: Yes  Pain : 0-10 Pain Score: 1  Pain Type: Chronic pain Pain Location: Knee Pain Orientation: Right Pain Descriptors / Indicators: Aching, Discomfort, Dull Pain Relieving Factors: medication  Pain Relieving Factors:  medication  Nutritional Status: BMI 25 -29 Overweight Nutritional Risks: None Diabetes: No  How often do you need to have someone help you when you read instructions, pamphlets, or other written materials from your doctor or pharmacy?: 3 - Sometimes (memory issues; spouse assist) What is the last grade level you completed in school?: college  Diabetic?No  Interpreter Needed?: No  Information entered by :: Emelia Loron RN   Activities of Daily Living    04/08/2022    3:55 PM  In your present state of health, do you have any difficulty performing the following activities:  Hearing? 0  Vision? 0  Difficulty concentrating or making decisions? 1  Walking or climbing stairs? 0  Dressing or bathing? 0  Doing errands, shopping? 0  Preparing Food and eating ? N  Using the Toilet? N  In the past six months, have you accidently leaked urine? N  Do you have problems with loss of bowel control? N  Managing your Medications? N  Managing your Finances? N  Housekeeping or managing your Housekeeping? N    Patient Care Team: Plotnikov, Evie Lacks, MD as PCP - General (Internal Medicine) Leo Grosser, Seymour Bars, MD (Inactive) (Obstetrics and Gynecology) Marybelle Killings, MD (Orthopedic Surgery) Ara Kussmaul, MD (Ophthalmology) Paralee Cancel, MD as Consulting Physician (Orthopedic Surgery) Feliz Beam, MD as Referring Physician (Ophthalmology) Carmelina Dane, MD as Referring Physician  (Ophthalmology) Servando Salina, MD as Consulting Physician (Obstetrics and Gynecology) Melida Quitter, MD as Consulting Physician (Otolaryngology) Kathrynn Ducking, MD (Inactive) as Consulting Physician (Neurology) Charlton Haws, [facility] as Pharmacist (Pharmacist) Szabat, Darnelle Maffucci, [facility] (Inactive) as Pharmacist (Pharmacist)  Indicate any recent Medical Services you may have received from other than [facility] providers in the past year (date may be approximate).     Assessment:   This is a routine wellness examination for Gay.  Hearing/Vision screen No results found.  Dietary issues and exercise activities discussed: Current Exercise Habits: Home exercise routine, Type of exercise: walking;strength training/weights (stationary bike), Time (Minutes): 60, Frequency (Times/Week): 3, Weekly Exercise (Minutes/Week): 180, Intensity: Mild, Exercise limited by: None identified   Goals Addressed             This Visit's Progress    Patient Stated       Continue to go to the gym and eat healthy.  I want to begin to drive again to be more independent.      Depression Screen    04/08/2022    3:51 PM 06/26/2021    8:57 AM 03/29/2021    2:44 PM 03/29/2021    2:41 PM 12/19/2020    2:27 PM 12/07/2019    3:59 PM 06/19/2019   10:55 PM  PHQ 2/9 Scores  PHQ - 2 Score 1 0 0 0 0 0 0  PHQ- 9 Score     0      Fall Risk    04/08/2022    4:00 PM 06/26/2021    8:57 AM 03/29/2021    2:44 PM 12/19/2020    2:26 PM 12/07/2019    3:59 PM  [place] in the past year? 0 0 0 0 0  Number falls in past yr: 0  0 0 0  Injury with Fall? 0  0 0 0  Risk for fall due to : No Fall Risks   No Fall Risks No Fall Risks  Follow up Falls evaluation completed  Falls evaluation completed  Falls evaluation completed    FALL RISK  PREVENTION PERTAINING TO THE HOME:  Any stairs in or around the home? Yes  If so, are there any without handrails? Yes  Home free of loose throw rugs in walkways, pet beds,  electrical cords, etc? Yes  Adequate lighting in your home to reduce risk of falls? Yes   ASSISTIVE DEVICES UTILIZED TO PREVENT FALLS:  Life alert? No  Use of a cane, walker or w/c? No  Grab bars in the bathroom? No  Shower chair or bench in shower? No  Elevated toilet seat or a handicapped toilet? No   Cognitive Function:    03/13/2022   10:41 AM 09/11/2021    2:34 PM 04/29/2021   10:21 AM 11/01/2020   10:04 AM 05/03/2020   10:00 AM  MMSE - Mini Mental State Exam  Orientation to time '5 5 4 4 5  '$ Orientation to Place '5 5 5 5 5  '$ Registration '3 3 3 3 3  '$ Attention/ Calculation '2 5 2 1 5  '$ Recall '2 1 1 1 2  '$ Language- name 2 objects '2 2 2 2 2  '$ Language- repeat 1 0 0 1 1  Language- follow 3 step command '2 3 3 3 3  '$ Language- read & follow direction '1 1 1 1 1  '$ Write a sentence '1 1 1 1 1  '$ Copy design 1 0 0 1 1  Total score '25 26 22 23 29        '$ 04/08/2022    4:01 PM  6CIT Screen  What Year? 0 points  What month? 0 points  What time? 0 points  Count back from 20 0 points  Months in reverse 4 points  Repeat phrase 2 points  Total Score 6 points    Immunizations Immunization History  Administered Date(s) Administered   COVID-19, mRNA, vaccine(Comirnaty)12 years and older 02/04/2022   Fluad Quad(high Dose 65+) 01/12/2019, 02/04/2022   Influenza Split 02/26/2011, 03/01/2012   Influenza Whole 02/10/2008, 02/15/2009, 02/19/2010   Influenza, High Dose Seasonal PF 03/09/2013, 03/21/2015, 04/01/2016, 03/03/2017, 01/23/2021   Influenza,inj,Quad PF,6+ Mos 05/10/2014, 02/13/2018   PFIZER(Purple Top)SARS-COV-2 Vaccination 05/25/2019, 06/15/2019, 02/04/2020, 08/11/2020   Pfizer Covid-19 Vaccine Bivalent Booster 75yr & up 01/23/2021   Pneumococcal Conjugate-13 03/23/2013   Pneumococcal Polysaccharide-23 02/19/2010, 05/18/2013   Td 02/10/2008   Td (Adult), 2 Lf Tetanus Toxid, Preservative Free 02/10/2008   Tdap 06/14/2018   Zoster, Live 07/04/2009    Flu Vaccine status: Up to  date  Pneumococcal vaccine status: Up to date  Covid-19 vaccine status: Information provided on how to obtain vaccines.   Qualifies for Shingles Vaccine? Yes   Zostavax completed No   Shingrix Completed?: No.    Education has been provided regarding the importance of this vaccine. Patient has been advised to call insurance company to determine out of pocket expense if they have not yet received this vaccine. Advised may also receive vaccine at local pharmacy or Health Dept. Verbalized acceptance and understanding.  Screening Tests Health Maintenance  Topic Date Due   Zoster Vaccines- Shingrix (1 of 2) Never done   COVID-19 Vaccine (6 - 2023-24 season) 01/10/2022   Medicare Annual Wellness (AWV)  04/09/2023   Pneumonia Vaccine 76 Years old  Completed   INFLUENZA VACCINE  Completed   DEXA SCAN  Completed   Hepatitis C Screening  Completed   HPV VACCINES  Aged Out   COLONOSCOPY (Pts [phone] coverage will need to be confirmed)  Discontinued    Health Maintenance  Health Maintenance Due  Topic  Date Due   Zoster Vaccines- Shingrix (1 of 2) Never done   COVID-19 Vaccine (6 - 2023-24 season) 01/10/2022    Colorectal cancer screening: Type of screening: Colonoscopy. Completed 04/29/13. Repeat every never years  Mammogram status: No longer required due to age.  Bone Density status: Completed 07/04/19. Results reflect: Bone density results: OSTEOPOROSIS. Repeat every 2 years.  Lung Cancer Screening: (Low Dose CT Chest recommended if Age 53-80 years, 30 pack-year currently smoking OR have quit w/in 15years.) does not qualify.   Additional Screening:  Hepatitis C Screening: does qualify; Completed 06/06/16  Vision Screening: Recommended annual ophthalmology exams for early detection of glaucoma and other disorders of the eye. Is the patient up to date with their annual eye exam?  Yes  Who is the provider or what is the name of the office in which the patient attends annual  eye exams? [facility] If pt is not established with a provider, would they like to be referred to a provider to establish care?  N/A .   Dental Screening: Recommended annual dental exams for proper oral hygiene  Community Resource Referral / Chronic Care Management: CRR required this visit?  No   CCM required this visit?  No      Plan:     I have personally reviewed and noted the following in the patient's chart:   Medical and social history Use of alcohol, tobacco or illicit drugs  Current medications and supplements including opioid prescriptions. Patient is not currently taking opioid prescriptions. Functional ability and status Nutritional status Physical activity Advanced directives List of other physicians Hospitalizations, surgeries, and ER visits in previous 12 months Vitals Screenings to include cognitive, depression, and falls Referrals and appointments  In addition, I have reviewed and discussed with patient certain preventive protocols, quality metrics, and best practice recommendations. A written personalized care plan for preventive services as well as general preventive health recommendations were provided to patient.     Michiel Cowboy, RN   04/08/2022   Nurse Notes:  Ms. Guedes , Thank you for taking time to come for your Medicare Wellness Visit. I appreciate your ongoing commitment to your health goals. Please review the following plan we discussed and let me know if I can assist you in the future.   These are the goals we discussed:  Goals       Patient Stated     Patient Stated (pt-stated)      To maintain my current health status by continuing to eat healthy and stay physically active & socially active.      Other     Patient Stated      Continue to go to the gym and eat healthy.  I want to begin to drive again to be more independent.      Prevent Falls and Broken Bones-Osteoporosis      Timeframe:  Long-Range Goal Priority:  High Start  Date:      03/25/21                       Expected End Date:   03/25/22                   Follow Up Date May 2023   - always use handrails on the stairs - always wear shoes or slippers with non-slip sole - get at least 10 minutes of activity every day - make an emergency alert plan in case I fall -  pick up clutter from the floors - use a nightlight in the bathroom    Why is this important?   When you fall, there are 3 things that control if a bone breaks or not.  These are the fall itself, how hard and the direction that you fall and how fragile your bones are.  Preventing falls is very important for you because of fragile bones.     Notes:         This is a list of the screening recommended for you and due dates:  Health Maintenance  Topic Date Due   Zoster (Shingles) Vaccine (1 of 2) Never done   COVID-19 Vaccine (6 - 2023-24 season) 01/10/2022   Medicare Annual Wellness Visit  04/09/2023   Pneumonia Vaccine  Completed   Flu Shot  Completed   DEXA scan (bone density measurement)  Completed   Hepatitis C Screening: USPSTF Recommendation to screen - Ages 7-79 yo.  Completed   HPV Vaccine  Aged Out   Colon Cancer Screening  Discontinued     Medical screening examination/treatment/procedure(s) were performed by non-physician practitioner and as supervising physician I was immediately available for consultation/collaboration.  I agree with above. Lew Dawes, MD

## 2022-04-14 ENCOUNTER — Encounter: Payer: Self-pay | Admitting: Internal Medicine

## 2022-04-14 ENCOUNTER — Ambulatory Visit: Payer: Medicare PPO | Admitting: Internal Medicine

## 2022-04-14 VITALS — BP 130/78 | HR 72 | Temp 98.0°F | Ht 67.0 in | Wt 139.0 lb

## 2022-04-14 DIAGNOSIS — E559 Vitamin D deficiency, unspecified: Secondary | ICD-10-CM | POA: Diagnosis not present

## 2022-04-14 DIAGNOSIS — R413 Other amnesia: Secondary | ICD-10-CM | POA: Diagnosis not present

## 2022-04-14 DIAGNOSIS — F09 Unspecified mental disorder due to known physiological condition: Secondary | ICD-10-CM | POA: Diagnosis not present

## 2022-04-14 DIAGNOSIS — S81801S Unspecified open wound, right lower leg, sequela: Secondary | ICD-10-CM

## 2022-04-14 DIAGNOSIS — R87619 Unspecified abnormal cytological findings in specimens from cervix uteri: Secondary | ICD-10-CM | POA: Insufficient documentation

## 2022-04-14 DIAGNOSIS — S81801A Unspecified open wound, right lower leg, initial encounter: Secondary | ICD-10-CM | POA: Diagnosis not present

## 2022-04-14 DIAGNOSIS — Z8742 Personal history of other diseases of the female genital tract: Secondary | ICD-10-CM | POA: Insufficient documentation

## 2022-04-14 NOTE — Assessment & Plan Note (Signed)
Cont on Namenda; Donepezil

## 2022-04-14 NOTE — Progress Notes (Signed)
Subjective:  Patient ID: Wendy Serrano, female    DOB: 1946/03/16  Age: 76 y.o. MRN: [id number]  CC: Knee Pain   HPI Wendy Serrano presents for a skin lesion - s/p skin infection treatment on 03/21/22 - Doxy, Bactroban  C/o memory issues  Outpatient Medications Prior to Visit  Medication Sig Dispense Refill   aspirin 81 MG chewable tablet Chew 81 mg by mouth daily.     b complex vitamins tablet Take 1 tablet by mouth daily. 100 tablet 3   donepezil (ARICEPT) 10 MG tablet TAKE 1/2 TABLET AT BEDTIME FOR 1 MONTH, THEN TAKE 1 TABLET AT BEDTIME THEREAFTER 90 tablet 3   glucosamine-chondroitin 500-400 MG tablet Take 1 tablet by mouth 2 (two) times daily.     Magnesium 250 MG TABS Take 2 tablets by mouth daily.     memantine (NAMENDA) 10 MG tablet TAKE 1 TABLET BY MOUTH TWICE A DAY 180 tablet 1   methotrexate (RHEUMATREX) 2.5 MG tablet   3   Multiple Vitamins-Minerals (OCUVITE ADULT 50+) CAPS Take 1 capsule by mouth daily.     prednisoLONE acetate (PRED FORTE) 1 % ophthalmic suspension Place 1 drop into both eyes daily.     SIMBRINZA 1-0.2 % SUSP Place 1 drop into both eyes 2 (two) times daily.  6   calcium-vitamin D (OSCAL WITH D) 500-200 MG-UNIT per tablet Take 1 tablet by mouth 2 (two) times daily. (Patient not taking: Reported on 04/14/2022)     meclizine (ANTIVERT) 12.5 MG tablet meclizine 12.5 mg tablet  TAKE 1 TABLET BY MOUTH 3 TIMES DAILY AS NEEDED FOR DIZZINESS. (Patient not taking: Reported on 04/14/2022)     pregabalin (LYRICA) 25 MG capsule Take 1-2 capsules (25-50 mg total) by mouth at bedtime. (Patient not taking: Reported on 04/14/2022) 90 capsule 3   No facility-administered medications prior to visit.    ROS: Review of Systems  Constitutional:  Negative for activity change, appetite change, chills, fatigue and unexpected weight change.  HENT:  Negative for congestion, mouth sores and sinus pressure.   Eyes:  Negative for visual disturbance.  Respiratory:  Negative for  cough and chest tightness.   Gastrointestinal:  Negative for abdominal pain and nausea.  Genitourinary:  Negative for difficulty urinating, frequency and vaginal pain.  Musculoskeletal:  Negative for back pain and gait problem.  Skin:  Negative for pallor and rash.  Neurological:  Negative for dizziness, tremors, weakness, numbness and headaches.  Psychiatric/Behavioral:  Positive for decreased concentration. Negative for confusion and sleep disturbance. The patient is nervous/anxious.     Objective:  BP 130/78 (BP Location: Left Arm, Patient Position: Sitting, Cuff Size: Normal)   Pulse 72   Temp 98 F (36.7 C) (Oral)   Ht '5\' 7"'$  (1.702 m)   Wt 139 lb (63 kg)   SpO2 99%   BMI 21.77 kg/m   BP Readings from Last 3 Encounters:  04/14/22 130/78  03/13/22 (!) 147/76  03/06/22 122/82    Wt Readings from Last 3 Encounters:  04/14/22 139 lb (63 kg)  03/13/22 139 lb (63 kg)  03/06/22 137 lb (62.1 kg)    Physical Exam Constitutional:      General: She is not in acute distress.    Appearance: Normal appearance. She is well-developed.  HENT:     Head: Normocephalic.     Right Ear: External ear normal.     Left Ear: External ear normal.     Nose: Nose normal.  Eyes:  General:        Right eye: No discharge.        Left eye: No discharge.     Conjunctiva/sclera: Conjunctivae normal.     Pupils: Pupils are equal, round, and reactive to light.  Neck:     Thyroid: No thyromegaly.     Vascular: No JVD.     Trachea: No tracheal deviation.  Cardiovascular:     Rate and Rhythm: Normal rate and regular rhythm.     Heart sounds: Normal heart sounds.  Pulmonary:     Effort: No respiratory distress.     Breath sounds: No stridor. No wheezing.  Abdominal:     General: Bowel sounds are normal. There is no distension.     Palpations: Abdomen is soft. There is no mass.     Tenderness: There is no abdominal tenderness. There is no guarding or rebound.  Musculoskeletal:         General: No tenderness.     Cervical back: Normal range of motion and neck supple. No rigidity.  Lymphadenopathy:     Cervical: No cervical adenopathy.  Skin:    Findings: No erythema or rash.  Neurological:     Cranial Nerves: No cranial nerve deficit.     Motor: No abnormal muscle tone.     Coordination: Coordination normal.     Deep Tendon Reflexes: Reflexes normal.  Psychiatric:        Behavior: Behavior normal.        Thought Content: Thought content normal.        Judgment: Judgment normal.    Healed wound R knee w/pink skin  Lab Results  Component Value Date   WBC 3.6 (L) 03/06/2022   HGB 13.4 03/06/2022   HCT 40.2 03/06/2022   PLT 254.0 03/06/2022   GLUCOSE 83 03/06/2022   CHOL 208 (H) 08/22/2020   TRIG 51.0 08/22/2020   HDL 86.40 08/22/2020   LDLDIRECT 127.9 03/09/2013   LDLCALC 112 (H) 08/22/2020   ALT 25 03/06/2022   AST 32 03/06/2022   NA 141 03/06/2022   K 3.8 03/06/2022   CL 105 03/06/2022   CREATININE 0.88 03/06/2022   BUN 22 03/06/2022   CO2 29 03/06/2022   TSH 9.42 (H) 03/06/2022   INR 1.1 (H) 05/16/2015   HGBA1C 5.4 02/26/2011    VAS [place] ABI WITH/WO TBI  Result Date: 02/17/2022  LOWER EXTREMITY DOPPLER STUDY Patient Name:  Wendy Serrano  Date of Exam:   02/17/2022 Medical Rec #: [id number]        Accession #:    [id number] Date of Birth: 07-25-1945        Patient Gender: F Patient Age:   73 years Exam Location:  [facility] Procedure:      VAS [place] ABI WITH/WO TBI Referring Phys: --------------------------------------------------------------------------------  Indications: Patient reports right leg doesn't feel normal. Episodes of numbness              to the bottom of the foot. Primary reports poor pulsese on exam. High Risk Factors: None.  Performing Technologist: Ronal Fear RVS, RCS  Examination Guidelines: A complete evaluation includes at minimum, Doppler waveform signals and systolic blood pressure reading at the level of  bilateral brachial, anterior tibial, and posterior tibial arteries, when vessel segments are accessible. Bilateral testing is considered an integral part of a complete examination. Photoelectric Plethysmograph (PPG) waveforms and toe systolic pressure readings are included as required and additional duplex testing as needed. Limited examinations  for reoccurring indications may be performed as noted.  ABI Findings: +---------+------------------+-----+---------+--------+ Right    Rt Pressure (mmHg)IndexWaveform Comment  +---------+------------------+-----+---------+--------+ Brachial 125                                      +---------+------------------+-----+---------+--------+ PTA      133               1.06 triphasic         +---------+------------------+-----+---------+--------+ DP       151               1.21 triphasic         +---------+------------------+-----+---------+--------+ Great Toe99                0.79                   +---------+------------------+-----+---------+--------+ +---------+------------------+-----+---------+-------+ Left     Lt Pressure (mmHg)IndexWaveform Comment +---------+------------------+-----+---------+-------+ Brachial 122                                     +---------+------------------+-----+---------+-------+ PTA      153               1.22 triphasic        +---------+------------------+-----+---------+-------+ DP       158               1.26 triphasic        +---------+------------------+-----+---------+-------+ Great Toe103               0.82                  +---------+------------------+-----+---------+-------+ +-------+-----------+-----------+------------+------------+ ABI/TBIToday's ABIToday's TBIPrevious ABIPrevious TBI +-------+-----------+-----------+------------+------------+ Right  1.21       0.79                                +-------+-----------+-----------+------------+------------+ Left    1.26       0.82                                +-------+-----------+-----------+------------+------------+   Summary: Right: Resting right ankle-brachial index is within normal range. The right toe-brachial index is normal. Left: Resting left ankle-brachial index is within normal range. The left toe-brachial index is normal. *See table(s) above for measurements and observations.  Electronically signed by Harold Barban MD on 02/17/2022 at 3:18:03 PM.    Final     Assessment & Plan:   Problem List Items Addressed This Visit     Leg wound, right - Primary    Healed, scar tissue Use Aquaphor, bandaid      Memory difficulties    Cont on Namenda; Donepezil      Mild cognitive disorder    Cont on Namenda; Donepezil      Vitamin D deficiency    On Vit D         No orders of the defined types were placed in this encounter.     Follow-up: Return in about 3 months (around 07/14/2022) for a follow-up visit.  Walker Kehr, MD

## 2022-04-14 NOTE — Assessment & Plan Note (Signed)
Healed, scar tissue Use Aquaphor, bandaid

## 2022-04-14 NOTE — Assessment & Plan Note (Signed)
On Vit D 

## 2022-04-17 ENCOUNTER — Ambulatory Visit: Payer: Medicare PPO | Admitting: Neurology

## 2022-04-30 ENCOUNTER — Telehealth: Payer: Self-pay | Admitting: Neurology

## 2022-04-30 NOTE — Telephone Encounter (Signed)
Called pt back and relayed Dr. Billey Gosling message. Pt will continue on Namenda/Aricept. If she has new/worsening sx prior to f/u she will call. She will f/u with PCP about noise in ear. Will ask if she should see ENT for further evaluation.

## 2022-04-30 NOTE — Telephone Encounter (Signed)
It may take more time for Namenda to build in her system as she just increased the dose a few weeks ago. She is currently already on the highest dose of Namenda and donepezil. I would have her continue to monitor for now, and she can have repeat memory testing at her next visit to see if there have been any significant memory changes.

## 2022-04-30 NOTE — Telephone Encounter (Signed)
Pt is calling. Stated she is having some frying in her ear and the memory loss is getting worse. Pt is requesting a call back from nurse

## 2022-04-30 NOTE — Telephone Encounter (Signed)
Pt saw Dr. Brett Fairy 03/15/22.   Called pt back to get more info. Pt reports she is concerned about her memory issues. Two days ago, she was trying to put up groceries. She forgot she had already put it up. This has never happened before. She has also noticed "frying sound" in L ear. I asked her to describe this more, she was unable. States PCP is aware of this as well. This started to be more frequent about 2 wk ago. She takes amantadine '10mg'$ , 1 tab po BID.   Confirmed she did increase Namenda to '10mg'$  po BID a few weeks after she saw Dr. Brett Fairy last. Has not started any new meds recently. No infection/illness currently. I updated med list, pharmacy, allergy list on file. Aware I will send to covering MD for suggestions and call back.

## 2022-05-24 ENCOUNTER — Other Ambulatory Visit: Payer: Self-pay | Admitting: Neurology

## 2022-06-03 ENCOUNTER — Encounter: Payer: Self-pay | Admitting: Internal Medicine

## 2022-06-03 NOTE — Progress Notes (Unsigned)
    Subjective:    Patient ID: Wendy Serrano, female    DOB: 11-Jan-1946, 77 y.o.   MRN: [id number]      HPI Wendy Serrano is here for No chief complaint on file.    Pain on right side of head -   Had similar 11/22 - dr neuro - gaba 100-200 mg hs  Medications and allergies reviewed with patient and updated if appropriate.  Current Outpatient Medications on File Prior to Visit  Medication Sig Dispense Refill   amantadine (SYMMETREL) 100 MG capsule Take 100 mg by mouth 2 (two) times daily.     Apoaequorin (PREVAGEN PO) Take 1 capsule by mouth daily.     aspirin 81 MG chewable tablet Chew 81 mg by mouth daily.     b complex vitamins tablet Take 1 tablet by mouth daily. 100 tablet 3   calcium-vitamin D (OSCAL WITH D) 500-200 MG-UNIT per tablet Take 1 tablet by mouth 2 (two) times daily.     donepezil (ARICEPT) 10 MG tablet TAKE 1/2 TABLET AT BEDTIME FOR 1 MONTH, THEN TAKE 1 TABLET AT BEDTIME THEREAFTER (Patient taking differently: Take 10 mg by mouth at bedtime.) 90 tablet 3   FOLIC ACID PO Take 4 tablets by mouth daily.     glucosamine-chondroitin 500-400 MG tablet Take 1 tablet by mouth 2 (two) times daily.     Magnesium 400 MG TABS Take 1 tablet by mouth daily.     memantine (NAMENDA) 10 MG tablet TAKE 1 TABLET BY MOUTH TWICE A DAY 180 tablet 1   methotrexate (RHEUMATREX) 2.5 MG tablet 6 tablets on Saturday  3   Multiple Vitamins-Minerals (OCUVITE ADULT 50+) CAPS Take 1 capsule by mouth daily.     prednisoLONE acetate (PRED FORTE) 1 % ophthalmic suspension Place 1 drop into both eyes daily.     SIMBRINZA 1-0.2 % SUSP Place 1 drop into both eyes 2 (two) times daily.  6   UNABLE TO FIND Take 2 capsules by mouth daily. Med Name: [facility] mane     UNABLE TO FIND Take 2 capsules by mouth daily. Med Name: Clint Guy- for cholesterol     No current facility-administered medications on file prior to visit.    Review of Systems     Objective:  There were no vitals filed for this visit. BP  Readings from Last 3 Encounters:  04/14/22 130/78  03/13/22 (!) 147/76  03/06/22 122/82   Wt Readings from Last 3 Encounters:  04/14/22 139 lb (63 kg)  03/13/22 139 lb (63 kg)  03/06/22 137 lb (62.1 kg)   There is no height or weight on file to calculate BMI.    Physical Exam         Assessment & Plan:    See Problem List for Assessment and Plan of chronic medical problems.

## 2022-06-04 ENCOUNTER — Ambulatory Visit: Payer: Medicare PPO | Admitting: Internal Medicine

## 2022-06-04 VITALS — BP 128/78 | HR 72 | Temp 98.0°F | Ht 67.0 in | Wt 134.0 lb

## 2022-06-04 DIAGNOSIS — R519 Headache, unspecified: Secondary | ICD-10-CM | POA: Diagnosis not present

## 2022-06-04 MED ORDER — GABAPENTIN 100 MG PO CAPS: 100.0000 mg | ORAL_CAPSULE | Freq: Every evening | ORAL | 1 refills | Status: DC | PRN

## 2022-06-04 NOTE — Patient Instructions (Addendum)
        Medications changes include :   Take gabapentin 100 mg at bedtime if needed     Return if symptoms worsen or fail to improve.

## 2022-06-05 ENCOUNTER — Other Ambulatory Visit: Payer: Self-pay | Admitting: Internal Medicine

## 2022-06-12 ENCOUNTER — Ambulatory Visit: Payer: Medicare PPO | Admitting: Internal Medicine

## 2022-06-12 ENCOUNTER — Encounter: Payer: Self-pay | Admitting: Internal Medicine

## 2022-06-12 VITALS — BP 128/76 | HR 62 | Temp 98.1°F | Ht 67.0 in | Wt 137.0 lb

## 2022-06-12 DIAGNOSIS — M79671 Pain in right foot: Secondary | ICD-10-CM | POA: Diagnosis not present

## 2022-06-12 DIAGNOSIS — G3184 Mild cognitive impairment, so stated: Secondary | ICD-10-CM | POA: Diagnosis not present

## 2022-06-12 DIAGNOSIS — R7989 Other specified abnormal findings of blood chemistry: Secondary | ICD-10-CM

## 2022-06-12 DIAGNOSIS — H9313 Tinnitus, bilateral: Secondary | ICD-10-CM | POA: Diagnosis not present

## 2022-06-12 DIAGNOSIS — R413 Other amnesia: Secondary | ICD-10-CM | POA: Diagnosis not present

## 2022-06-12 DIAGNOSIS — F09 Unspecified mental disorder due to known physiological condition: Secondary | ICD-10-CM | POA: Diagnosis not present

## 2022-06-12 NOTE — Assessment & Plan Note (Signed)
Per Dr Dohmeier: "MCI- stable for years- slight decline- I would like to investigate a one time MOCA. MMSE is again 25 or 28/30 , when replacing math with spelling. - mild to moderate Cognitive impairment with amnestic events, semantic memory, word fluency-  along all segments of memory. "  MoCA test is pending

## 2022-06-12 NOTE — Assessment & Plan Note (Signed)
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

## 2022-06-12 NOTE — Patient Instructions (Signed)
Callus pads Toe spacers

## 2022-06-12 NOTE — Assessment & Plan Note (Addendum)
03/2022: Per Dr Dohmeier: "MCI- stable for years- slight decline- I would like to investigate a one time MOCA. MMSE is again 25 or 28/30 , when replacing math with spelling. - mild to moderate Cognitive impairment with amnestic events, semantic memory, word fluency-  along all segments of memory. "  MoCA test is pending Cont on Namenda; Donepezil

## 2022-06-12 NOTE — Progress Notes (Signed)
Subjective:  Patient ID: Wendy Serrano, female    DOB: 1946/01/24  Age: 77 y.o. MRN: [id number]  CC: Follow-up   HPI Brittnye Josephs presents for memory problems, frying sounds in the ears  Per Dr Dohmeier:  "MCI- stable for years- slight decline- I would like to investigate a one time MOCA. MMSE is again 25 or 28/30 , when replacing math with spelling. - mild to moderate Cognitive impairment with amnestic events, semantic memory, word fluency-  along all segments of memory. "  Outpatient Medications Prior to Visit  Medication Sig Dispense Refill   amantadine (SYMMETREL) 100 MG capsule Take 100 mg by mouth 2 (two) times daily.     Apoaequorin (PREVAGEN PO) Take 1 capsule by mouth daily.     aspirin 81 MG chewable tablet Chew 81 mg by mouth daily.     b complex vitamins tablet Take 1 tablet by mouth daily. 100 tablet 3   calcium-vitamin D (OSCAL WITH D) 500-200 MG-UNIT per tablet Take 1 tablet by mouth 2 (two) times daily.     donepezil (ARICEPT) 10 MG tablet TAKE 1/2 TABLET AT BEDTIME FOR 1 MONTH, THEN TAKE 1 TABLET AT BEDTIME THEREAFTER (Patient taking differently: Take 10 mg by mouth at bedtime.) 90 tablet 3   FOLIC ACID PO Take 4 tablets by mouth daily.     gabapentin (NEURONTIN) 100 MG capsule Take 1 capsule (100 mg total) by mouth at bedtime as needed. 90 capsule 1   glucosamine-chondroitin 500-400 MG tablet Take 1 tablet by mouth 2 (two) times daily.     Magnesium 400 MG TABS Take 1 tablet by mouth daily.     memantine (NAMENDA) 10 MG tablet TAKE 1 TABLET BY MOUTH TWICE A DAY 180 tablet 1   methotrexate (RHEUMATREX) 2.5 MG tablet 6 tablets on Saturday  3   Multiple Vitamins-Minerals (OCUVITE ADULT 50+) CAPS Take 1 capsule by mouth daily.     prednisoLONE acetate (PRED FORTE) 1 % ophthalmic suspension Place 1 drop into both eyes daily.     SIMBRINZA 1-0.2 % SUSP Place 1 drop into both eyes 2 (two) times daily.  6   UNABLE TO FIND Take 2 capsules by mouth daily. Med Name: Lions  mane     UNABLE TO FIND Take 2 capsules by mouth daily. Med Name: Clint Guy- for cholesterol     No facility-administered medications prior to visit.    ROS: Review of Systems  Constitutional:  Negative for activity change, appetite change, chills, fatigue and unexpected weight change.  HENT:  Positive for tinnitus. Negative for congestion, mouth sores and sinus pressure.   Eyes:  Negative for visual disturbance.  Respiratory:  Negative for cough and chest tightness.   Gastrointestinal:  Negative for abdominal pain and nausea.  Genitourinary:  Negative for difficulty urinating, frequency and vaginal pain.  Musculoskeletal:  Negative for back pain and gait problem.  Skin:  Negative for pallor and rash.  Neurological:  Positive for headaches. Negative for dizziness, tremors, weakness and numbness.  Psychiatric/Behavioral:  Positive for decreased concentration and sleep disturbance. Negative for confusion and suicidal ideas. The patient is not nervous/anxious.     Objective:  BP 128/76 (BP Location: Right Arm, Patient Position: Sitting, Cuff Size: Normal)   Pulse 62   Temp 98.1 F (36.7 C) (Oral)   Ht '5\' 7"'$  (1.702 m)   Wt 137 lb (62.1 kg)   SpO2 99%   BMI 21.46 kg/m   BP Readings from Last 3 Encounters:  06/12/22  128/76  06/04/22 128/78  04/14/22 130/78    Wt Readings from Last 3 Encounters:  06/12/22 137 lb (62.1 kg)  06/04/22 134 lb (60.8 kg)  04/14/22 139 lb (63 kg)    Physical Exam Constitutional:      General: She is not in acute distress.    Appearance: She is well-developed.  HENT:     Head: Normocephalic.     Right Ear: External ear normal.     Left Ear: External ear normal.     Nose: Nose normal.  Eyes:     General:        Right eye: No discharge.        Left eye: No discharge.     Conjunctiva/sclera: Conjunctivae normal.     Pupils: Pupils are equal, round, and reactive to light.  Neck:     Thyroid: No thyromegaly.     Vascular: No JVD.     Trachea: No  tracheal deviation.  Cardiovascular:     Rate and Rhythm: Normal rate and regular rhythm.     Heart sounds: Normal heart sounds.  Pulmonary:     Effort: No respiratory distress.     Breath sounds: No stridor. No wheezing.  Abdominal:     General: Bowel sounds are normal. There is no distension.     Palpations: Abdomen is soft. There is no mass.     Tenderness: There is no abdominal tenderness. There is no guarding or rebound.  Musculoskeletal:        General: No tenderness.     Cervical back: Normal range of motion and neck supple. No rigidity.  Lymphadenopathy:     Cervical: No cervical adenopathy.  Skin:    Findings: No erythema or rash.  Neurological:     Mental Status: Mental status is at baseline.     Cranial Nerves: No cranial nerve deficit.     Motor: No abnormal muscle tone.     Coordination: Coordination normal.     Gait: Gait normal.     Deep Tendon Reflexes: Reflexes normal.  Psychiatric:        Behavior: Behavior normal.        Judgment: Judgment normal.   Pt repeats herself a lot R in-between toe callus  Lab Results  Component Value Date   WBC 3.6 (L) 03/06/2022   HGB 13.4 03/06/2022   HCT 40.2 03/06/2022   PLT 254.0 03/06/2022   GLUCOSE 83 03/06/2022   CHOL 208 (H) 08/22/2020   TRIG 51.0 08/22/2020   HDL 86.40 08/22/2020   LDLDIRECT 127.9 03/09/2013   LDLCALC 112 (H) 08/22/2020   ALT 25 03/06/2022   AST 32 03/06/2022   NA 141 03/06/2022   K 3.8 03/06/2022   CL 105 03/06/2022   CREATININE 0.88 03/06/2022   BUN 22 03/06/2022   CO2 29 03/06/2022   TSH 9.42 (H) 03/06/2022   INR 1.1 (H) 05/16/2015   HGBA1C 5.4 02/26/2011    VAS [place] ABI WITH/WO TBI  Result Date: 02/17/2022  LOWER EXTREMITY DOPPLER STUDY Patient Name:  Wendy Serrano  Date of Exam:   02/17/2022 Medical Rec #: [id number]        Accession #:    [id number] Date of Birth: 1946-04-15        Patient Gender: F Patient Age:   17 years Exam Location:  [facility] Procedure:       VAS [place] ABI WITH/WO TBI Referring Phys: --------------------------------------------------------------------------------  Indications: Patient reports right leg doesn't feel  normal. Episodes of numbness              to the bottom of the foot. Primary reports poor pulsese on exam. High Risk Factors: None.  Performing Technologist: Ronal Fear RVS, RCS  Examination Guidelines: A complete evaluation includes at minimum, Doppler waveform signals and systolic blood pressure reading at the level of bilateral brachial, anterior tibial, and posterior tibial arteries, when vessel segments are accessible. Bilateral testing is considered an integral part of a complete examination. Photoelectric Plethysmograph (PPG) waveforms and toe systolic pressure readings are included as required and additional duplex testing as needed. Limited examinations for reoccurring indications may be performed as noted.  ABI Findings: +---------+------------------+-----+---------+--------+ Right    Rt Pressure (mmHg)IndexWaveform Comment  +---------+------------------+-----+---------+--------+ Brachial 125                                      +---------+------------------+-----+---------+--------+ PTA      133               1.06 triphasic         +---------+------------------+-----+---------+--------+ DP       151               1.21 triphasic         +---------+------------------+-----+---------+--------+ Great Toe99                0.79                   +---------+------------------+-----+---------+--------+ +---------+------------------+-----+---------+-------+ Left     Lt Pressure (mmHg)IndexWaveform Comment +---------+------------------+-----+---------+-------+ Brachial 122                                     +---------+------------------+-----+---------+-------+ PTA      153               1.22 triphasic        +---------+------------------+-----+---------+-------+ DP       158                1.26 triphasic        +---------+------------------+-----+---------+-------+ Great Toe103               0.82                  +---------+------------------+-----+---------+-------+ +-------+-----------+-----------+------------+------------+ ABI/TBIToday's ABIToday's TBIPrevious ABIPrevious TBI +-------+-----------+-----------+------------+------------+ Right  1.21       0.79                                +-------+-----------+-----------+------------+------------+ Left   1.26       0.82                                +-------+-----------+-----------+------------+------------+   Summary: Right: Resting right ankle-brachial index is within normal range. The right toe-brachial index is normal. Left: Resting left ankle-brachial index is within normal range. The left toe-brachial index is normal. *See table(s) above for measurements and observations.  Electronically signed by Harold Barban MD on 02/17/2022 at 3:18:03 PM.    Final     Assessment & Plan:   Problem List Items Addressed This Visit       Nervous and Auditory   Mild cognitive disorder - Primary  03/2022: Per Dr Dohmeier: "MCI- stable for years- slight decline- I would like to investigate a one time MOCA. MMSE is again 25 or 28/30 , when replacing math with spelling. - mild to moderate Cognitive impairment with amnestic events, semantic memory, word fluency-  along all segments of memory. "  MoCA test is pending Cont on Namenda; Donepezil      Relevant Orders   Comprehensive metabolic panel   T4, free   TSH   T3, free     Other   Foot pain, right    R in-between toe callus Podiatry ref      MCI (mild cognitive impairment)    Per Dr Dohmeier: "MCI- stable for years- slight decline- I would like to investigate a one time MOCA. MMSE is again 25 or 28/30 , when replacing math with spelling. - mild to moderate Cognitive impairment with amnestic events, semantic memory, word fluency-  along all segments of  memory. "  MoCA test is pending      Memory difficulties    Per Dr Dohmeier: "MCI- stable for years- slight decline- I would like to investigate a one time MOCA. MMSE is again 25 or 28/30 , when replacing math with spelling. - mild to moderate Cognitive impairment with amnestic events, semantic memory, word fluency-  along all segments of memory. 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     Follow-up: No follow-ups on file.  Walker Kehr, MD

## 2022-06-12 NOTE — Assessment & Plan Note (Signed)
R in-between toe callus Podiatry ref

## 2022-06-26 ENCOUNTER — Ambulatory Visit (INDEPENDENT_AMBULATORY_CARE_PROVIDER_SITE_OTHER): Payer: Medicare PPO | Admitting: Podiatry

## 2022-06-26 DIAGNOSIS — M2011 Hallux valgus (acquired), right foot: Secondary | ICD-10-CM | POA: Diagnosis not present

## 2022-06-26 DIAGNOSIS — M2041 Other hammer toe(s) (acquired), right foot: Secondary | ICD-10-CM

## 2022-06-26 DIAGNOSIS — M21611 Bunion of right foot: Secondary | ICD-10-CM

## 2022-06-26 DIAGNOSIS — G609 Hereditary and idiopathic neuropathy, unspecified: Secondary | ICD-10-CM

## 2022-06-26 DIAGNOSIS — L84 Corns and callosities: Secondary | ICD-10-CM

## 2022-06-26 NOTE — Progress Notes (Signed)
  Subjective:  Patient ID: Wendy Serrano, female    DOB: 09-25-1945,  MRN: [id number]  Chief Complaint  Patient presents with   Bunions    right foot pain-prefer to see another provider-2nd opinion    77 y.o. female presents with the above complaint. History confirmed with patient.  She has a bunion on both feet.  Her feet feel funny often at night like there is a weird sensation on both sides.  She also has a hard spot between the toes on the right foot between the third and fourth toes  Objective:  Physical Exam: warm, good capillary refill, no trophic changes or ulcerative lesions, normal DP and PT pulses, and bilateral hallux valgus and lesser hammertoe deformities, third interspace on the right has a heloma molle, submetatarsal 2 porokeratosis.  Assessment:   1. Callus of foot   2. Hallux valgus with bunions, right   3. Hammertoe of right foot   4. Idiopathic peripheral neuropathy      Plan:  Patient was evaluated and treated and all questions answered.  We discussed that she likely has idiopathic peripheral neuropathy that is age-related.  Discussed treatment of this when it is symptomatic if this worsens and becomes painful.  She will let me know if she is in need of this but so far is minimally bothersome and not lifestyle limiting.  Her hammertoe and bunion deformities are relatively pain-free.  We discussed how this contributes to formation of the hyperkeratotic lesions.  I debrided these as a courtesy today.  We discussed offloading with silicone pads and toe spacers which she will use.  She will return to see me as needed for this if it does not improve or worsens  Return if symptoms worsen or fail to improve.

## 2022-07-06 ENCOUNTER — Other Ambulatory Visit: Payer: Self-pay | Admitting: Internal Medicine

## 2022-07-09 ENCOUNTER — Encounter: Payer: Self-pay | Admitting: Internal Medicine

## 2022-07-09 ENCOUNTER — Ambulatory Visit: Payer: Medicare PPO | Admitting: Internal Medicine

## 2022-07-09 VITALS — Temp 98.3°F | Ht 67.0 in | Wt 138.4 lb

## 2022-07-09 DIAGNOSIS — R21 Rash and other nonspecific skin eruption: Secondary | ICD-10-CM | POA: Diagnosis not present

## 2022-07-09 DIAGNOSIS — H40013 Open angle with borderline findings, low risk, bilateral: Secondary | ICD-10-CM | POA: Diagnosis not present

## 2022-07-09 DIAGNOSIS — E559 Vitamin D deficiency, unspecified: Secondary | ICD-10-CM

## 2022-07-09 DIAGNOSIS — J309 Allergic rhinitis, unspecified: Secondary | ICD-10-CM

## 2022-07-09 MED ORDER — TRIAMCINOLONE ACETONIDE 0.1 % EX CREA: 1.0000 | TOPICAL_CREAM | Freq: Two times a day (BID) | CUTANEOUS | 1 refills | Status: DC

## 2022-07-09 NOTE — Patient Instructions (Signed)
Please take all new medication as prescribed - the steroid cream as needed for the rash  Please continue all other medications as before, and refills have been done if requested.  Please have the pharmacy call with any other refills you may need.  Please keep your appointments with your specialists as you may have planned

## 2022-07-09 NOTE — Assessment & Plan Note (Signed)
Last vitamin D Lab Results  Component Value Date   VD25OH 34.64 06/26/2021   Low, reminded to start oral replacement

## 2022-07-09 NOTE — Assessment & Plan Note (Signed)
Ok for [employer] and/or nasacort asd prn.  to f/u any worsening symptoms or concerns

## 2022-07-09 NOTE — Progress Notes (Signed)
Patient ID: Wendy Serrano, female   DOB: 02/13/1946, 77 y.o.   MRN: [id number]        Chief Complaint: follow up palmar rash right > left hands, low vit d       HPI:  Wendy Serrano is a 77 y.o. female here with c/o scaly itchy rash to right > left palms and some to fingers as well for several wks, without pain, fever, chills, red streaks or drainage.  Pt denies chest pain, increased sob or doe, wheezing, orthopnea, PND, increased LE swelling, palpitations, dizziness or syncope.  Nothing seems to make better or worse.  Does have several wks ongoing nasal allergy symptoms with clearish congestion, itch and sneezing, without fever, pain, ST, cough, swelling or wheezing.       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 She has never used smokeless tobacco. She reports that she does not drink alcohol and does not use drugs. family history includes Diabetes in her mother; Hyperlipidemia in her sister; Pancreatic cancer in her mother; Prostate cancer in her father. No Known  Allergies Current Outpatient Medications on File Prior to Visit  Medication Sig Dispense Refill   amantadine (SYMMETREL) 100 MG capsule Take 100 mg by mouth 2 (two) times daily.     Apoaequorin (PREVAGEN PO) Take 1 capsule by mouth daily.     aspirin 81 MG chewable tablet Chew 81 mg by mouth daily.     b complex vitamins tablet Take 1 tablet by mouth daily. 100 tablet 3   calcium-vitamin D (OSCAL WITH D) 500-200 MG-UNIT per tablet Take 1 tablet by mouth 2 (two) times daily.     donepezil (ARICEPT) 10 MG tablet TAKE 1/2 TABLET AT BEDTIME FOR 1 MONTH, THEN TAKE 1 TABLET AT BEDTIME THEREAFTER (Patient taking differently: Take 10 mg by mouth at bedtime.) 90 tablet 3   FOLIC ACID PO Take 4 tablets by mouth daily.     gabapentin (NEURONTIN) 100 MG capsule Take 1 capsule (100 mg total) by mouth at bedtime as needed. 90 capsule 1   glucosamine-chondroitin 500-400 MG tablet Take 1 tablet by mouth 2 (two) times daily.     Magnesium 400 MG TABS Take 1 tablet by mouth daily.     memantine (NAMENDA) 10 MG tablet TAKE 1 TABLET BY MOUTH TWICE A DAY  180 tablet 1   methotrexate (RHEUMATREX) 2.5 MG tablet 6 tablets on Saturday  3   Multiple Vitamins-Minerals (OCUVITE ADULT 50+) CAPS Take 1 capsule by mouth daily.     prednisoLONE acetate (PRED FORTE) 1 % ophthalmic suspension Place 1 drop into both eyes daily.     SIMBRINZA 1-0.2 % SUSP Place 1 drop into both eyes 2 (two) times daily.  6   UNABLE TO FIND Take 2 capsules by mouth daily. Med Name: Lions mane     UNABLE TO FIND Take 2 capsules by mouth daily. Med Name: Clint Guy- for cholesterol     No current facility-administered medications on file prior to visit.        ROS:  All others reviewed and negative.  Objective        PE:  Temp 98.3 F (36.8 C) (Temporal)   Ht '5\' 7"'$  (1.702 m)   Wt 138 lb 6 oz (62.8 kg)   SpO2 98%   BMI 21.67 kg/m                 Constitutional: Pt appears in NAD               HENT: Head: NCAT.                Right Ear:  External ear normal.                 Left Ear: External ear normal.  Bilat tm's with mild erythema.  Max sinus areas non tender.  Pharynx with mild erythema, no exudate               Eyes: . Pupils are equal, round, and reactive to light. Conjunctivae and EOM are normal               Nose: without d/c or deformity               Neck: Neck supple. Gross normal ROM               Cardiovascular: Normal rate and regular rhythm.                 Pulmonary/Chest: Effort normal and breath sounds without rales or wheezing.                               Neurological: Pt is alert. At baseline orientation, motor grossly intact               Skin: Skin is warm. LE edema - none, bilat palmar scaly nontender eczematous type rash, some finger involvement as well               Psychiatric: Pt behavior is normal without agitation   Micro: none  Cardiac tracings I have personally interpreted today:  none  Pertinent Radiological findings (summarize): none   Lab Results  Component Value Date   WBC 3.6 (L) 03/06/2022   HGB 13.4 03/06/2022   HCT 40.2 03/06/2022   PLT 254.0 03/06/2022   GLUCOSE 83 03/06/2022   CHOL 208 (H) 08/22/2020   TRIG 51.0 08/22/2020   HDL 86.40 08/22/2020   LDLDIRECT 127.9 03/09/2013   LDLCALC 112 (H) 08/22/2020   ALT 25 03/06/2022   AST 32 03/06/2022   NA 141 03/06/2022   K 3.8 03/06/2022   CL 105 03/06/2022   CREATININE 0.88 03/06/2022   BUN 22 03/06/2022   CO2 29 03/06/2022  TSH 9.42 (H) 03/06/2022   INR 1.1 (H) 05/16/2015   HGBA1C 5.4 02/26/2011   Assessment/Plan:  Wendy Serrano is a 77 y.o. Black or African American [2] female with  has a past medical history of Allergic rhinitis, Atrophy of vagina (07/15/10), C. difficile diarrhea (06/2017), Cervicogenic headache (09/21/2019), Eczema, H/O osteopenia (01/09/04), H/O osteoporosis (2008), H/O varicella, Herpes simplex without mention of complication, History of measles, mumps, or rubella, Leukopenia, Liver hemangioma,  Macular degeneration (oct '11), Menopausal symptoms, Renal cyst, Rotator cuff tear, and Yeast infection.  Rash Exam is c/w eczematous rash - for triam cr prn,  to f/u any worsening symptoms or concerns  Vitamin D deficiency Last vitamin D Lab Results  Component Value Date   VD25OH 34.64 06/26/2021   Low, reminded to start oral replacement   Allergic rhinitis Ok for [employer] and/or nasacort asd prn.  to f/u any worsening symptoms or concerns  Followup: Return if symptoms worsen or fail to improve.  Cathlean Cower, MD 07/09/2022 4:29 PM [facility]

## 2022-07-09 NOTE — Assessment & Plan Note (Signed)
Exam is c/w eczematous rash - for triam cr prn,  to f/u any worsening symptoms or concerns

## 2022-07-25 DIAGNOSIS — H31092 Other chorioretinal scars, left eye: Secondary | ICD-10-CM | POA: Diagnosis not present

## 2022-07-25 DIAGNOSIS — H35372 Puckering of macula, left eye: Secondary | ICD-10-CM | POA: Diagnosis not present

## 2022-07-25 DIAGNOSIS — Z79899 Other long term (current) drug therapy: Secondary | ICD-10-CM | POA: Diagnosis not present

## 2022-07-25 DIAGNOSIS — R7989 Other specified abnormal findings of blood chemistry: Secondary | ICD-10-CM | POA: Diagnosis not present

## 2022-07-25 DIAGNOSIS — H40043 Steroid responder, bilateral: Secondary | ICD-10-CM | POA: Diagnosis not present

## 2022-07-25 DIAGNOSIS — H35363 Drusen (degenerative) of macula, bilateral: Secondary | ICD-10-CM | POA: Diagnosis not present

## 2022-07-25 DIAGNOSIS — Z961 Presence of intraocular lens: Secondary | ICD-10-CM | POA: Diagnosis not present

## 2022-07-25 DIAGNOSIS — H35351 Cystoid macular degeneration, right eye: Secondary | ICD-10-CM | POA: Diagnosis not present

## 2022-07-25 DIAGNOSIS — H209 Unspecified iridocyclitis: Secondary | ICD-10-CM | POA: Diagnosis not present

## 2022-07-30 ENCOUNTER — Telehealth: Payer: Self-pay | Admitting: Internal Medicine

## 2022-07-30 NOTE — Telephone Encounter (Signed)
Patient called and said she still has an irritation on her right palm. She was seen by Dr. [name] for it on 07/09/22. She would like to know if Dr. Alain Marion has any recommendations or if he would like to refer her to dermatology. Patient has been scheduled for 08/12/22 to be seen by Dr. Alain Marion, but she would like to know what to do in the meantime. Best callback is [phone].

## 2022-08-01 ENCOUNTER — Encounter: Payer: Self-pay | Admitting: Internal Medicine

## 2022-08-01 ENCOUNTER — Ambulatory Visit: Payer: Medicare PPO | Admitting: Internal Medicine

## 2022-08-01 VITALS — BP 120/78 | HR 74 | Temp 98.0°F | Ht 67.0 in | Wt 132.0 lb

## 2022-08-01 DIAGNOSIS — L309 Dermatitis, unspecified: Secondary | ICD-10-CM

## 2022-08-01 NOTE — Patient Instructions (Addendum)
Apply vaseline at night. Use eucerin cream or vanicream or the regular cetaphil cream multiple times a day Use the triamcinolone cream twice day Avoid prolonged water exposure    A referral was ordered for dermatology.     Someone will call you to schedule an appointment.     Atopic Dermatitis - Eczema   Atopic dermatitis is a skin disorder that causes inflammation of the skin. It is marked by a red rash and itchy, dry, scaly skin. It is the most common type of eczema. Eczema is a group of skin conditions that cause the skin to become rough and swollen. This condition is generally worse during the cooler winter months and often improves during the warm summer months. Atopic dermatitis usually starts showing signs in infancy and can last through adulthood. This condition cannot be passed from one person to another (is not contagious). Atopic dermatitis may not always be present, but when it is, it is called a flare-up. What are the causes? The exact cause of this condition is not known. Flare-ups may be triggered by: Coming in contact with something that you are sensitive or allergic to (allergen). Stress. Certain foods. Extremely hot or cold weather. Harsh chemicals and soaps. Dry air. Chlorine. What increases the risk? This condition is more likely to develop in people who have a personal or family history of: Eczema. Allergies. Asthma. Hay fever. What are the signs or symptoms? Symptoms of this condition include: Dry, scaly skin. Red, itchy rash. Itchiness, which can be severe. This may occur before the skin rash. This can make sleeping difficult. Skin thickening and cracking that can occur over time. How is this diagnosed? This condition is diagnosed based on: Your symptoms. Your medical history. A physical exam. How is this treated? There is no cure for this condition, but symptoms can usually be controlled. Treatment focuses on: Controlling the itchiness and  scratching. You may be given medicines, such as antihistamines or steroid creams. Limiting exposure to allergens. Recognizing situations that cause stress and developing a plan to manage stress. If your atopic dermatitis does not get better with medicines, or if it is all over your body (widespread), a treatment using a specific type of light (phototherapy) may be used. Follow these instructions at home: Skin care  Keep your skin well moisturized. Doing this seals in moisture and helps to prevent dryness. Use unscented lotions that have petroleum in them. Avoid lotions that contain alcohol or water. They can dry the skin. Keep baths or showers short (less than 5 minutes) in warm water. Do not use hot water. Use mild, unscented cleansers for bathing. Avoid soap and bubble bath. Apply a moisturizer to your skin right after a bath or shower. Do not apply anything to your skin without checking with your health care provider. General instructions Take or apply over-the-counter and prescription medicines only as told by your health care provider. Dress in clothes made of cotton or cotton blends. Dress lightly because heat increases itchiness. When washing your clothes, rinse your clothes twice so all of the soap is removed. Avoid any triggers that can cause a flare-up. Keep your fingernails cut short. Avoid scratching. Scratching makes the rash and itchiness worse. A break in the skin from scratching could result in a skin infection (impetigo). Do not be around people who have cold sores or fever blisters. If you get the infection, it may cause your atopic dermatitis to worsen. Keep all follow-up visits. This is important.  Contact a health care provider if: Your itchiness interferes with sleep. Your rash gets worse or is not better within one week of starting treatment. You have a fever. You have a rash flare-up after having contact with someone who has cold sores or fever blisters. Get help  right away if: You develop pus or soft yellow scabs in the rash area. Summary Atopic dermatitis causes a red rash and itchy, dry, scaly skin. Treatment focuses on controlling the itchiness and scratching, limiting exposure to things that you are sensitive or allergic to (allergens), recognizing situations that cause stress, and developing a plan to manage stress. Keep your skin well moisturized. Keep baths or showers shorter than 5 minutes and use warm water. Do not use hot water. This information is not intended to replace advice given to you by your health care provider. Make sure you discuss any questions you have with your health care provider. Document Revised: 02/06/2020 Document Reviewed: 02/06/2020 Elsevier Patient Education  [place].

## 2022-08-01 NOTE — Progress Notes (Signed)
Subjective:    Patient ID: Wendy Serrano, female    DOB: 04-04-1946, 77 y.o.   MRN: [id number]      HPI Iliani is here for  Chief Complaint  Patient presents with   Rash    Rash on the right inside of palm x 2-3 weeks     Right palm only and spots on fingers - has been using triamcinolone 0.1% cream it is not helping.  It is irritating-does not itch.  She was concerned it was contagious and would spread to other areas she was not careful.   Vaseline helped soothe it at night.     Uses cetaphil cream, but got a new type and she was concerned that was the cause so she stopped using it.    Medications and allergies reviewed with patient and updated if appropriate.  Current Outpatient Medications on File Prior to Visit  Medication Sig Dispense Refill   amantadine (SYMMETREL) 100 MG capsule Take 100 mg by mouth 2 (two) times daily.     Apoaequorin (PREVAGEN PO) Take 1 capsule by mouth daily.     aspirin 81 MG chewable tablet Chew 81 mg by mouth daily.     b complex vitamins tablet Take 1 tablet by mouth daily. 100 tablet 3   calcium-vitamin D (OSCAL WITH D) 500-200 MG-UNIT per tablet Take 1 tablet by mouth 2 (two) times daily.     donepezil (ARICEPT) 10 MG tablet TAKE 1/2 TABLET AT BEDTIME FOR 1 MONTH, THEN TAKE 1 TABLET AT BEDTIME THEREAFTER (Patient taking differently: Take 10 mg by mouth at bedtime.) 90 tablet 3   FOLIC ACID PO Take 4 tablets by mouth daily.     gabapentin (NEURONTIN) 100 MG capsule Take 1 capsule (100 mg total) by mouth at bedtime as needed. 90 capsule 1   glucosamine-chondroitin 500-400 MG tablet Take 1 tablet by mouth 2 (two) times daily.     Magnesium 400 MG TABS Take 1 tablet by mouth daily.     memantine (NAMENDA) 10 MG tablet TAKE 1 TABLET BY MOUTH TWICE A DAY 180 tablet 1   methotrexate (RHEUMATREX) 2.5 MG tablet 6 tablets on Saturday  3   Multiple Vitamins-Minerals (OCUVITE ADULT 50+) CAPS Take 1 capsule by mouth daily.     prednisoLONE acetate  (PRED FORTE) 1 % ophthalmic suspension Place 1 drop into both eyes daily.     SIMBRINZA 1-0.2 % SUSP Place 1 drop into both eyes 2 (two) times daily.  6   triamcinolone cream (KENALOG) 0.1 % Apply 1 Application topically 2 (two) times daily. 30 g 1   UNABLE TO FIND Take 2 capsules by mouth daily. Med Name: [facility]     UNABLE TO FIND Take 2 capsules by mouth daily. Med Name: Clint Guy- for cholesterol     No current facility-administered medications on file prior to visit.    Review of Systems     Objective:   Vitals:   08/01/22 1025  BP: 120/78  Pulse: 74  Temp: 98 F (36.7 C)  SpO2: 94%   BP Readings from Last 3 Encounters:  08/01/22 120/78  06/12/22 128/76  06/04/22 128/78   Wt Readings from Last 3 Encounters:  08/01/22 132 lb (59.9 kg)  07/09/22 138 lb 6 oz (62.8 kg)  06/12/22 137 lb (62.1 kg)   Body mass index is 20.67 kg/m.    Physical Exam Constitutional:      General: She is not in acute distress.  Appearance: Normal appearance. She is not ill-appearing.  HENT:     Head: Normocephalic and atraumatic.  Skin:    General: Skin is warm and dry.     Comments: Patch of dry skin in a circular formation and center of right palm, patches of dryness on right thumb, near wrist and a couple of other areas in the palm.  No other part of the body is involved  Neurological:     Mental Status: She is alert.            Assessment & Plan:    Atopic dermatitis-eczema: Subacute-started 2-3 weeks ago I agree rash is consistent with probable eczema Has been using the triamcinolone cream but states it is not helping Advised to continue triamcinolone cream Start Vaseline at night-can cover hand in cotton glove Use regular Cetaphil, Eucerin or Vanicream several times throughout the day Avoid long exposures to water Referral for dermatology ordered Reassured this is not contagious

## 2022-08-02 NOTE — Telephone Encounter (Signed)
Use triamcinolone cream 4 times a day. Wash hands less frequent, dry hands well.  Use Aquaphor fo skin moisturizing Use nitril medical gloves to wash dishes, clean etc. See a dermatologist if not better Thank you

## 2022-08-04 NOTE — Telephone Encounter (Signed)
Called pt no answer LMOM w/MD response../lmb 

## 2022-08-06 ENCOUNTER — Encounter: Payer: Self-pay | Admitting: Dermatology

## 2022-08-06 ENCOUNTER — Ambulatory Visit: Payer: Medicare PPO | Admitting: Dermatology

## 2022-08-06 DIAGNOSIS — L309 Dermatitis, unspecified: Secondary | ICD-10-CM | POA: Diagnosis not present

## 2022-08-06 MED ORDER — CLOTRIMAZOLE-BETAMETHASONE 1-0.05 % EX CREA: 1.0000 | TOPICAL_CREAM | Freq: Two times a day (BID) | CUTANEOUS | 1 refills | Status: DC

## 2022-08-06 NOTE — Progress Notes (Signed)
   New Patient Visit  Subjective  Wendy Serrano is a 77 y.o. female who presents for the following: Eczema (Patient is here for eczema on right hand. Has had this spot for about a week. Uses Cetaphil lotion. Puts a glove on hand when bathing. Uncomfortable but not too itchy).     Objective  Well appearing patient in no apparent distress; mood and affect are within normal limits.  A focused examination was performed including hands. Relevant physical exam findings are noted in the Assessment and Plan.  Right Hand - Anterior Erythematous plaques with mild scale   Assessment & Plan  Dermatitis Right Hand - Anterior  Tinea Manum vs irritant dermatitis   Hand Dermatitis is a chronic type of eczema that can come and go on the hands and fingers.  While there is no cure, the rash and symptoms can be managed with topical prescription medications, and for more severe cases, with systemic medications.  Recommend mild soap and routine use of moisturizing cream after handwashing.  Minimize soap/water exposure when possible.     clotrimazole-betamethasone (LOTRISONE) cream - Right Hand - Anterior Apply 1 Application topically 2 (two) times daily. Apply topically to the hands twice daily for up to two weeks   Return if symptoms worsen or fail to improve.  I, Zigmund Gottron, CMA, am acting as scribe for Ellard Artis, MD.  Documentation: I have reviewed the above documentation for accuracy and completeness, and I agree with the above  [place], DO

## 2022-08-06 NOTE — Patient Instructions (Addendum)
Due to recent changes in healthcare laws, you may see results of your pathology and/or laboratory studies on MyChart before the doctors have had a chance to review them. We understand that in some cases there may be results that are confusing or concerning to you. Please understand that not all results are received at the same time and often the doctors may need to interpret multiple results in order to provide you with the best plan of care or course of treatment. Therefore, we ask that you please give [place] 2 business days to thoroughly review all your results before contacting the office for clarification. Should we see a critical lab result, you will be contacted sooner.   If You Need Anything After Your Visit  If you have any questions or concerns for your doctor, please call our main line at [phone] If no one answers, please leave a voicemail as directed and we will return your call as soon as possible. Messages left after 4 pm will be answered the following business day.   You may also send [place] a message via [place]. We typically respond to MyChart messages within 1-2 business days.  For prescription refills, please ask your pharmacy to contact our office. Our fax number is [phone].  If you have an urgent issue when the clinic is closed that cannot wait until the next business day, you can page your doctor at the number below.    Please note that while we do our best to be available for urgent issues outside of office hours, we are not available 24/7.   If you have an urgent issue and are unable to reach [place], you may choose to seek medical care at your doctor's office, retail clinic, urgent care center, or emergency room.  If you have a medical emergency, please immediately call 911 or go to the emergency department. In the event of inclement weather, please call our main line at [phone] for an update on the status of any delays or closures.  Dermatology Medication Tips: Please  keep the boxes that topical medications come in in order to help keep track of the instructions about where and how to use these. Pharmacies typically print the medication instructions only on the boxes and not directly on the medication tubes.   If your medication is too expensive, please contact our office at [phone] or send [place] a message through [place].   We are unable to tell what your co-pay for medications will be in advance as this is different depending on your insurance coverage. However, we may be able to find a substitute medication at lower cost or fill out paperwork to get insurance to cover a needed medication.   If a prior authorization is required to get your medication covered by your insurance company, please allow [place] 1-2 business days to complete this process.  Drug prices often vary depending on where the prescription is filled and some pharmacies may offer cheaper prices.  The website www.goodrx.com contains coupons for medications through different pharmacies. The prices here do not account for what the cost may be with help from insurance (it may be cheaper with your insurance), but the website can give you the price if you did not use any insurance.  - You can print the associated coupon and take it with your prescription to the pharmacy.  - You may also stop by our office during regular business hours and pick up a GoodRx coupon card.  - If you need your  prescription sent electronically to a different pharmacy, notify our office through [facility] or by phone at [phone]

## 2022-08-11 NOTE — Progress Notes (Unsigned)
Patient: Wendy Serrano Date of Birth: 1945/06/01  Reason for Visit: Follow up History from: Patient, husband Primary Neurologist: Dohmeier  ASSESSMENT AND PLAN 77 y.o. year old female   1.  Cognitive impairment  -MMSE 25/30 at last visit, MoCA today 14/30, history does not support any evidence of major decline since last visit, she did stop driving on her own -After long discussion with patient and husband, will check ATN profile to screen for Alzheimer's disease, referral to neuropsychiatric evaluation to better categorize memory deficits -Continue Namenda 10 mg twice daily, Aricept 10 mg daily -Have suggested increasing brain stimulating activity, consistent physical exercise, healthy eating, drinking plenty of water -Will follow-up in 4 months or sooner if needed with Dr. Brett Fairy, they were disappointed this visit was not with Dr. Brett Fairy, but I will send my note to her if she has any further suggestions  Meds ordered this encounter  Medications   memantine (NAMENDA) 10 MG tablet    Sig: Take 1 tablet (10 mg total) by mouth 2 (two) times daily.    Dispense:  180 tablet    Refill:  1    HISTORY OF PRESENT ILLNESS: Today 08/12/22 She last saw Dr. Brett Fairy November 2023, Namenda was increased to 10 mg twice a day.  Continue Aricept 10 mg a day. MMSE Nov 2023 25/30, today [facility] 14/30, MoCA was not done at last visit. Here with her husband. Has done okay with increase in Namenda. Does her own ADLs, keep the house, husband cooks. Stopped driving on her own about a month ago. She worried she'd get lost.  A former Tourist information centre manager. Has a habit of tuning people out. She repeats herself, asks her husband the same questions. What bothers him is that she doesn't concentrate, depends on him. Goes to fitness center 3 days a week. Has had ringing in the ears to the left. Has been to [facility]. Was on Lyrica, takes PRN for tenderness to left head. Goes to bed in the very early morning hours. Her husband has  a lot of questions today, they were hoping to see Dr. Brett Fairy.   HISTORY 03/13/22 Dr. Brett Fairy: Lauralee Dolson is a 77 y.o.  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 Jannifer Franklin. Mrs. Reinhold had reported memory troubles in the past and  today on 13 March 2022 we are testing her by Mini-Mental status exam.  She had used over-the-counter supplements in the past such as lying main mushroom and Prevagen.  In the meantime she has started on Namenda and is taking currently 5 mg twice a day.  Namenda for person of her body mass index should slowly be increased to 10 mg twice daily and I will be happy to change the prescription from 5-10 today.  Her BMI is 21.7, she weighs today in clothing 139 pounds, her blood pressure was 147/76, heart rate was not noted.  Her repeat memory test today showed 25 out of 30 points which is considered between a mild cognitive impairment and an early possible dementia.  I like to add that the deficits in this memory test were in the serial 7 math category and I can replace this with the spelling of the word backwards as an equivalent score.  She had no trouble writing a sentence copying an image, and on the 3 word recall she scored 2 which is actually pretty good. So replacing the serial 7 test she  scores a total of 28 out of 30 points and this is in normal range.  The patient was a professor for language arts and she states that she was never good with math.   She  mentioned a buzzing sound in the left ear, tinnitus. No scalp sensation abnormality.   REVIEW OF SYSTEMS: Out of a complete 14 system review of symptoms, the patient complains only of the following symptoms, and all other reviewed systems are negative.  See HPI  ALLERGIES: No Known Allergies  HOME MEDICATIONS: Outpatient Medications Prior to Visit  Medication Sig Dispense Refill   amantadine (SYMMETREL) 100 MG capsule Take 100 mg  by mouth 2 (two) times daily.     Apoaequorin (PREVAGEN PO) Take 1 capsule by mouth daily.     aspirin 81 MG chewable tablet Chew 81 mg by mouth daily.     b complex vitamins tablet Take 1 tablet by mouth daily. 100 tablet 3   calcium-vitamin D (OSCAL WITH D) 500-200 MG-UNIT per tablet Take 1 tablet by mouth 2 (two) times daily.     clotrimazole-betamethasone (LOTRISONE) cream Apply 1 Application topically 2 (two) times daily. Apply topically to the hands twice daily for up to two weeks 30 g 1   donepezil (ARICEPT) 10 MG tablet TAKE 1/2 TABLET AT BEDTIME FOR 1 MONTH, THEN TAKE 1 TABLET AT BEDTIME THEREAFTER (Patient taking differently: Take 10 mg by mouth at bedtime.) 90 tablet 3   FOLIC ACID PO Take 4 tablets by mouth daily.     glucosamine-chondroitin 500-400 MG tablet Take 1 tablet by mouth 2 (two) times daily.     Magnesium 400 MG TABS Take 1 tablet by mouth daily.     methotrexate (RHEUMATREX) 2.5 MG tablet 6 tablets on Saturday  3   Multiple Vitamins-Minerals (OCUVITE ADULT 50+) CAPS Take 1 capsule by mouth daily.     prednisoLONE acetate (PRED FORTE) 1 % ophthalmic suspension Place 1 drop into both eyes daily.     pregabalin (LYRICA) 100 MG capsule Take 100 mg by mouth 2 (two) times daily.     SIMBRINZA 1-0.2 % SUSP Place 1 drop into both eyes 2 (two) times daily.  6   triamcinolone cream (KENALOG) 0.1 % Apply 1 Application topically 2 (two) times daily. 30 g 1   UNABLE TO FIND Take 2 capsules by mouth daily. Med Name: Lions mane     UNABLE TO FIND Take 2 capsules by mouth daily. Med Name: Kyolic- for cholesterol     memantine (NAMENDA) 10 MG tablet TAKE 1 TABLET BY MOUTH TWICE A DAY 180 tablet 1   gabapentin (NEURONTIN) 100 MG capsule Take 1 capsule (100 mg total) by mouth at bedtime as needed. 90 capsule 1   No facility-administered medications prior to visit.    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   Menopausal symptoms    Renal cyst    bilateral   Rotator cuff tear  Yeast infection     PAST SURGICAL HISTORY: Past Surgical History:  Procedure Laterality Date   ROTATOR CUFF REPAIR Left 08/2009   Left shoulder-rotator cuff repair [facility])   WISDOM TOOTH EXTRACTION      FAMILY HISTORY: Family History  Problem Relation Age of Onset   Prostate cancer Father        died   Pancreatic cancer Mother        died   Diabetes Mother    Hyperlipidemia Sister     SOCIAL HISTORY: Social History   Socioeconomic History   Marital status: Married    Spouse name: [name]   Number of children: 0   Years of education: college   Highest education level: Master's degree (e.g., MA, MS, MEng, MEd, MSW, MBA)  Occupational History   Occupation: Retired  Tobacco Use   Smoking status: Never   Smokeless tobacco: Never  Vaping Use   Vaping Use: Never used  Substance and Sexual Activity   Alcohol use: No   Drug use: No   Sexual activity: Never    Birth control/protection: Post-menopausal  Other Topics Concern   Not on file  Social History Narrative   [employer] BA; MEd-teaching;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 They are doing well   Marriage is in good health and retirement is goodEnd-of-life: provided packet of information and forms for consideration (Oct'11)      Lives with husband   Right handed   Caffeine: zero   Social Determinants of Health   Financial Resource Strain: Low Risk  (04/08/2022)   Overall  Financial Resource Strain (CARDIA)    Difficulty of Paying Living Expenses: Not hard at all  Food Insecurity: No Food Insecurity (04/08/2022)   Hunger Vital Sign    Worried About Running Out of Food in the Last Year: Never true    [place] in the Last Year: Never true  Transportation Needs: Patient Declined (07/31/2022)   [place] - Transportation    Lack of Transportation (Medical): Patient declined    Lack of Transportation (Non-Medical): Patient declined  Physical Activity: Sufficiently Active (07/31/2022)   Exercise Vital Sign    Days of Exercise per Week: 3 days    Minutes of Exercise per Session: 50 min  Stress: No Stress Concern Present (07/31/2022)   [place] of Stress : Not at all  Social Connections: Unknown (07/31/2022)   Social Connection and Isolation Panel [NHANES]    Frequency of Communication with Friends and Family: More than three times a week    Frequency of Social Gatherings with Friends and Family: More than three times a week    Attends Religious Services: More than 4 times per year    Active Member of [employer] or Organizations: Yes    Attends Archivist Meetings: 1 to 4 times per year    Marital Status: Patient declined  Intimate Partner Violence: Not At Risk (04/08/2022)   Humiliation, Afraid, Rape, and Kick questionnaire    Fear of Current or Ex-Partner: No    Emotionally Abused: No    Physically Abused: No    Sexually Abused: No    PHYSICAL EXAM  There were no vitals filed for this visit. There is no height or weight on file to calculate BMI.    08/12/2022    8:20 AM  [place]  Cognitive Assessment   Visuospatial/ Executive (0/5) 1  Naming (0/3) 3  Attention: Read list of digits (0/2) 2  Attention: Read list of letters (0/1) 1  Attention: Serial 7 subtraction starting at 100 (0/3) 1  Language: Repeat phrase (0/2) 1  Language : Fluency (0/1) 0  Abstraction  (0/2) 2  Delayed Recall (0/5) 0  Orientation (0/6) 3  Total 14   Generalized: Well developed, in no acute distress  Neurological examination  Mentation: Alert oriented to time, place, history taking. Follows all commands speech and language fluent.  Her husband does most of the talking.  She does repeat herself a few times.  She appears calm, relaxed. Cranial nerve II-XII: Pupils were equal round reactive to light. Extraocular movements were full, visual field were full on confrontational test. Facial sensation and strength were normal. Head turning and shoulder shrug  were normal and symmetric. Motor: The motor testing reveals 5 over 5 strength of all 4 extremities. Good symmetric motor tone is noted throughout.  Sensory: Sensory testing is intact to soft touch on all 4 extremities. No evidence of extinction is noted.  Coordination: Cerebellar testing reveals good finger-nose-finger and heel-to-shin bilaterally.  Gait and station: Gait is normal. Tandem gait is normal.  Reflexes: Deep tendon reflexes are symmetric and normal bilaterally.   DIAGNOSTIC DATA (LABS, IMAGING, TESTING) - I reviewed patient records, labs, notes, testing and imaging myself where available.  Lab Results  Component Value Date   WBC 3.6 (L) 03/06/2022   HGB 13.4 03/06/2022   HCT 40.2 03/06/2022   MCV 95.3 03/06/2022   PLT 254.0 03/06/2022      Component Value Date/Time   NA 141 03/06/2022 0841   K 3.8 03/06/2022 0841   CL 105 03/06/2022 0841   CO2 29 03/06/2022 0841   GLUCOSE 83 03/06/2022 0841   BUN 22 03/06/2022 0841   CREATININE 0.88 03/06/2022 0841   CALCIUM 9.8 03/06/2022 0841   PROT 7.5 03/06/2022 0841   ALBUMIN 4.5 03/06/2022 0841   AST 32 03/06/2022 0841   ALT 25 03/06/2022 0841   ALKPHOS 70 03/06/2022 0841   BILITOT 0.4 03/06/2022 0841   GFRNONAA 111.51 02/19/2010 1100   GFRAA 81 02/02/2008 0955   Lab Results  Component Value Date   CHOL 208 (H) 08/22/2020   HDL 86.40 08/22/2020    LDLCALC 112 (H) 08/22/2020   LDLDIRECT 127.9 03/09/2013   TRIG 51.0 08/22/2020   CHOLHDL 2 08/22/2020   Lab Results  Component Value Date   HGBA1C 5.4 02/26/2011   Lab Results  Component Value Date   VITAMINB12 >1504 (H) 06/26/2021   Lab Results  Component Value Date   TSH 9.42 (H) 03/06/2022    Butler Denmark, AGNP-C, DNP 08/12/2022, 9:56 AM [facility] [address] [place], [place] [address] [phone]

## 2022-08-12 ENCOUNTER — Ambulatory Visit: Payer: Medicare PPO | Admitting: Neurology

## 2022-08-12 ENCOUNTER — Telehealth: Payer: Self-pay | Admitting: Neurology

## 2022-08-12 ENCOUNTER — Ambulatory Visit: Payer: Medicare PPO | Admitting: Internal Medicine

## 2022-08-12 DIAGNOSIS — G3184 Mild cognitive impairment, so stated: Secondary | ICD-10-CM

## 2022-08-12 DIAGNOSIS — R413 Other amnesia: Secondary | ICD-10-CM

## 2022-08-12 MED ORDER — MEMANTINE HCL 10 MG PO TABS: 10.0000 mg | ORAL_TABLET | Freq: Two times a day (BID) | ORAL | 1 refills | Status: DC

## 2022-08-12 NOTE — Patient Instructions (Addendum)
Recommend increasing brain stimulating activities, physical exercise, work on healthy eating, drink plenty of water, continue Aricept and Namenda. Referral to neuropsych testing.  I would recommend refraining from driving at this point. Check memory lab work today.

## 2022-08-12 NOTE — Telephone Encounter (Signed)
Referral sent to [facility], phone # [phone].

## 2022-08-16 LAB — ATN PROFILE
A -- Beta-amyloid 42/40 Ratio: 0.101 — ABNORMAL LOW (ref >0.102)
Beta-amyloid 40: 166.52 pg/mL
Beta-amyloid 42: 16.83 pg/mL
N -- NfL, Plasma: 4.17 pg/mL (ref 0.00–7.64)
T -- p-tau181: 2.07 pg/mL — ABNORMAL HIGH (ref 0.00–0.97)

## 2022-08-19 ENCOUNTER — Telehealth: Payer: Self-pay | Admitting: Neurology

## 2022-08-19 NOTE — Telephone Encounter (Signed)
I called the patient and her husband.  We discussed the HTN profile was positive, suggestive of Alzheimer's disease with the presence of biomarkers.  We had a long conversation about what this means.  There was question of neuropsychological testing should still be conducted, I discussed with Dr. Vickey Huger, she would still recommend, would be helpful for the family, categorized as stage.  Her MoCA score was 14/30, she is a former professor. Will you please reach out to patient and let them know about plan to continue with neuropsych evaluation? Thanks

## 2022-08-20 ENCOUNTER — Telehealth: Payer: Self-pay | Admitting: Dermatology

## 2022-08-20 NOTE — Telephone Encounter (Signed)
Called and spoke to husband per dpr and he was agreeable to plan. He verbalized understanding. He had no questions at this time but was encouraged to call back if questions arise.

## 2022-08-20 NOTE — Telephone Encounter (Signed)
Called patient and scheduled for office visit

## 2022-08-24 ENCOUNTER — Other Ambulatory Visit: Payer: Self-pay | Admitting: Internal Medicine

## 2022-08-25 ENCOUNTER — Ambulatory Visit: Payer: Medicare PPO | Admitting: Family Medicine

## 2022-08-25 ENCOUNTER — Encounter: Payer: Self-pay | Admitting: Family Medicine

## 2022-08-25 VITALS — BP 124/78 | HR 80 | Temp 98.2°F | Resp 20 | Ht 67.0 in | Wt 136.0 lb

## 2022-08-25 DIAGNOSIS — L03115 Cellulitis of right lower limb: Secondary | ICD-10-CM

## 2022-08-25 DIAGNOSIS — L309 Dermatitis, unspecified: Secondary | ICD-10-CM | POA: Diagnosis not present

## 2022-08-25 MED ORDER — BETAMETHASONE DIPROPIONATE 0.05 % EX CREA
TOPICAL_CREAM | Freq: Two times a day (BID) | CUTANEOUS | 0 refills | Status: DC
Start: 2022-08-25 — End: 2023-03-24

## 2022-08-25 MED ORDER — CEPHALEXIN 500 MG PO CAPS
500.0000 mg | ORAL_CAPSULE | Freq: Two times a day (BID) | ORAL | 0 refills | Status: AC
Start: 2022-08-25 — End: 2022-09-01

## 2022-08-25 NOTE — Progress Notes (Signed)
Assessment & Plan:  1. Cellulitis of right leg Education provided on cellulitis. - cephALEXin (KEFLEX) 500 MG capsule; Take 1 capsule (500 mg total) by mouth 2 (two) times daily for 7 days.  Dispense: 14 capsule; Refill: 0  2. Eczema of right hand Education provided on eczema.  Encouraged patient to mix betamethasone cream with Cetaphil lotion and apply to her hand twice daily.  Recommended that she purchase some cloth gloves that she can wear damp under a dry glove during the night. - betamethasone dipropionate 0.05 % cream; Apply topically 2 (two) times daily.  Dispense: 45 g; Refill: 0    Follow up plan: Return if symptoms worsen or fail to improve.  Wendy Boston, MSN, APRN, FNP-C  Subjective:  HPI: Wendy Serrano is a 77 y.o. female presenting on 08/25/2022 for Insect Bite (Left knee - blister area above the knee. First noticed a itchy spot Thursday night, and then noticed the blister on Friday. Using Neosporin on it. ) and right hand fungal area (Seen DERM in past for this)  Patient is accompanied by her husband, who she is okay with being present.  Husband provides history due to mild cognitive impairment. An area of blisters and redness appeared above her right knee four days ago. There has been no drainage. They have been applying Neosporin. Denies known insect bite or injury to the area. He reports she had a similar concern in the past that resolved with antibiotics and left a scar.   Patient also has an area in the palm of her right hand that has not improved since she saw dermatology last month. She was prescribed clotrimazole-betamethasone, which her husband reports was not helping. He states when they called the office they were told to stop the cream and apply Cetaphil, which she has been doing.     ROS: Negative unless specifically indicated above in HPI.   Relevant past medical history reviewed and updated as indicated.   Allergies and medications reviewed and  updated.   Current Outpatient Medications:    amantadine (SYMMETREL) 100 MG capsule, Take 100 mg by mouth 2 (two) times daily., Disp: , Rfl:    Apoaequorin (PREVAGEN PO), Take 1 capsule by mouth daily., Disp: , Rfl:    aspirin 81 MG chewable tablet, Chew 81 mg by mouth daily., Disp: , Rfl:    b complex vitamins tablet, Take 1 tablet by mouth daily., Disp: 100 tablet, Rfl: 3   calcium-vitamin D (OSCAL WITH D) 500-200 MG-UNIT per tablet, Take 1 tablet by mouth 2 (two) times daily., Disp: , Rfl:    clotrimazole-betamethasone (LOTRISONE) cream, Apply 1 Application topically 2 (two) times daily. Apply topically to the hands twice daily for up to two weeks, Disp: 30 g, Rfl: 1   donepezil (ARICEPT) 10 MG tablet, TAKE 1/2 TABLET AT BEDTIME FOR 1 MONTH, THEN TAKE 1 TABLET AT BEDTIME THEREAFTER (Patient taking differently: Take 10 mg by mouth at bedtime.), Disp: 90 tablet, Rfl: 3   FOLIC ACID PO, Take 4 tablets by mouth daily., Disp: , Rfl:    glucosamine-chondroitin 500-400 MG tablet, Take 1 tablet by mouth 2 (two) times daily., Disp: , Rfl:    Magnesium 400 MG TABS, Take 1 tablet by mouth daily., Disp: , Rfl:    memantine (NAMENDA) 10 MG tablet, Take 1 tablet (10 mg total) by mouth 2 (two) times daily., Disp: 180 tablet, Rfl: 1   methotrexate (RHEUMATREX) 2.5 MG tablet, 6 tablets on Saturday, Disp: , Rfl: 3  Multiple Vitamins-Minerals (OCUVITE ADULT 50+) CAPS, Take 1 capsule by mouth daily., Disp: , Rfl:    prednisoLONE acetate (PRED FORTE) 1 % ophthalmic suspension, Place 1 drop into both eyes daily., Disp: , Rfl:    SIMBRINZA 1-0.2 % SUSP, Place 1 drop into both eyes 2 (two) times daily., Disp: , Rfl: 6   triamcinolone cream (KENALOG) 0.1 %, Apply 1 Application topically 2 (two) times daily., Disp: 30 g, Rfl: 1   UNABLE TO FIND, Take 2 capsules by mouth daily. Med Name: Lions mane, Disp: , Rfl:    UNABLE TO FIND, Take 2 capsules by mouth daily. Med Name: Kyolic- for cholesterol, Disp: , Rfl:     pregabalin (LYRICA) 100 MG capsule, Take 100 mg by mouth 2 (two) times daily. (Patient not taking: Reported on 08/25/2022), Disp: , Rfl:   No Known Allergies  Objective:   BP 124/78   Pulse 80   Temp 98.2 F (36.8 C)   Resp 20   Ht  (1.702 m)   Wt 136 lb (61.7 kg)   BMI 21.30 kg/m    Physical Exam Vitals reviewed.  Constitutional:      General: She is not in acute distress.    Appearance: Normal appearance. She is not ill-appearing, toxic-appearing or diaphoretic.  HENT:     Head: Normocephalic and atraumatic.  Eyes:     General: No scleral icterus.       Right eye: No discharge.        Left eye: No discharge.     Conjunctiva/sclera: Conjunctivae normal.  Cardiovascular:     Rate and Rhythm: Normal rate.  Pulmonary:     Effort: Pulmonary effort is normal. No respiratory distress.  Musculoskeletal:        General: Normal range of motion.     Cervical back: Normal range of motion.  Skin:    General: Skin is warm and dry.     Capillary Refill: Capillary refill takes less than 2 seconds.     Findings: Rash present. Rash is scaling (palm of hand).     Comments: Area of erythema and blisters to the top of her right thigh above the knee. It is warm to the touch. No drainage. The blisters have flattened and appear to have pus under them.   Neurological:     General: No focal deficit present.     Mental Status: She is alert and oriented to person, place, and time. Mental status is at baseline.  Psychiatric:        Mood and Affect: Mood normal.        Behavior: Behavior normal.        Thought Content: Thought content normal.        Judgment: Judgment normal.

## 2022-08-28 ENCOUNTER — Encounter: Payer: Self-pay | Admitting: Dermatology

## 2022-08-28 ENCOUNTER — Ambulatory Visit: Payer: Medicare PPO | Admitting: Dermatology

## 2022-08-28 VITALS — BP 117/77

## 2022-08-28 DIAGNOSIS — L819 Disorder of pigmentation, unspecified: Secondary | ICD-10-CM

## 2022-08-28 DIAGNOSIS — B352 Tinea manuum: Secondary | ICD-10-CM

## 2022-08-28 DIAGNOSIS — B009 Herpesviral infection, unspecified: Secondary | ICD-10-CM

## 2022-08-28 MED ORDER — HYDROQUINONE 4 % EX CREA
TOPICAL_CREAM | Freq: Two times a day (BID) | CUTANEOUS | 2 refills | Status: DC
Start: 2022-08-28 — End: 2022-11-26

## 2022-08-28 MED ORDER — VALACYCLOVIR HCL 1 G PO TABS: 1000.0000 mg | ORAL_TABLET | Freq: Two times a day (BID) | ORAL | 0 refills | Status: AC

## 2022-08-28 NOTE — Patient Instructions (Addendum)
Continue Clotrimazole/Betamethasone cream 2 x daily to right palm for 2 weeks as needed. Continue Cephalexin 500 mg until finished.  Apply Vaseline or Aquaphor and a bandage to right middle thigh as needed Apply Hydroquinone 2 x daily to the darkened patch only. Use up to 3 months      Due to recent changes in healthcare laws, you may see results of your pathology and/or laboratory studies on MyChart before the doctors have had a chance to review them. We understand that in some cases there may be results that are confusing or concerning to you. Please understand that not all results are received at the same time and often the doctors may need to interpret multiple results in order to provide you with the best plan of care or course of treatment. Therefore, we ask that you please give [place] 2 business days to thoroughly review all your results before contacting the office for clarification. Should we see a critical lab result, you will be contacted sooner.   If You Need Anything After Your Visit  If you have any questions or concerns for your doctor, please call our main line at [phone] If no one answers, please leave a voicemail as directed and we will return your call as soon as possible. Messages left after 4 pm will be answered the following business day.   You may also send [place] a message via MyChart. We typically respond to MyChart messages within 1-2 business days.  For prescription refills, please ask your pharmacy to contact our office. Our fax number is [phone].  If you have an urgent issue when the clinic is closed that cannot wait until the next business day, you can page your doctor at the number below.    Please note that while we do our best to be available for urgent issues outside of office hours, we are not available 24/7.   If you have an urgent issue and are unable to reach [place], you may choose to seek medical care at your doctor's office, retail clinic, urgent care  center, or emergency room.  If you have a medical emergency, please immediately call 911 or go to the emergency department. In the event of inclement weather, please call our main line at [phone] for an update on the status of any delays or closures.  Dermatology Medication Tips: Please keep the boxes that topical medications come in in order to help keep track of the instructions about where and how to use these. Pharmacies typically print the medication instructions only on the boxes and not directly on the medication tubes.   If your medication is too expensive, please contact our office at [phone] or send [place] a message through MyChart.   We are unable to tell what your co-pay for medications will be in advance as this is different depending on your insurance coverage. However, we may be able to find a substitute medication at lower cost or fill out paperwork to get insurance to cover a needed medication.   If a prior authorization is required to get your medication covered by your insurance company, please allow [place] 1-2 business days to complete this process.  Drug prices often vary depending on where the prescription is filled and some pharmacies may offer cheaper prices.  The website www.goodrx.com contains coupons for medications through different pharmacies. The prices here do not account for what the cost may be with help from insurance (it may be cheaper with your insurance), but the website can  give you the price if you did not use any insurance.  - You can print the associated coupon and take it with your prescription to the pharmacy.  - You may also stop by our office during regular business hours and pick up a GoodRx coupon card.  - If you need your prescription sent electronically to a different pharmacy, notify our office through [facility] or by phone at [phone]

## 2022-08-28 NOTE — Progress Notes (Signed)
   Follow-Up Visit   Subjective  Wendy Serrano is a 77 y.o. female who presents for the following:  Following up for Tinea Manuum vs Hand Dermatitis. She used Clotrimazole/Betamethasone cream 2 x daily for 2 weeks without improvement.  PCP gave Triamcinolone 0.1 % cream that she started 08/24/22. She says its not itchy and is starting to lighten.  New complaint:  She had a blistering irritated eruption on her right thigh which started last Thursday. PCP gave Cephalexin  BID x 7 days and Betamethasone 0.05% cream BID which she is still using. This eruption came out previously about 6 months to 1 year in the same area.Treatment does seem to help it.   The following portions of the chart were reviewed this encounter and updated as appropriate: medications, allergies, medical history  Review of Systems:  No other skin or systemic complaints except as noted in HPI or Assessment and Plan.  Objective  Well appearing patient in no apparent distress; mood and affect are within normal limits.   A focused examination was performed of the following areas: Hands, right thigh, right knee  Relevant exam findings are noted in the Assessment and Plan.    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  It is infectious and can be spread from person to person by direct contact.  It is not curable, but is treatable with topical and oral medication.  Treatment Plan  -Take Valacyclovir 2 grams every 12 hours for 7 days with a glass of water at the first sign of symptoms. -Apply Vaseline or Aquaphor and cover with a bandage. -Continue Keflex  BID as prescribed until finished.          POST-INFLAMMATORY HYPERPIGMENTATION (PIH) Exam: hyperpigmented macules and/or patches at face   This is a benign condition that comes from having previous inflammation in the skin and will fade with time over months to sometimes years. Recommend daily sun protection including sunscreen SPF 30+ to sun-exposed areas. - Recommend treating any itchy or red areas on the skin quickly to prevent new areas of PIH. Treating with prescription medicines such as hydroquinone may help fade dark spots faster.    Treatment Plan: Hydroquinone BID x 3 months to right medial knee   Tinea Manuum vs Hand Dermatitis --> improved since last visit Exam:  Scaly round patch at right palm  Treatment Plan: Continue Clotrimazole/Betamethasone 2 x daily for 2 weeks      No follow-ups on file.  Jaclynn Guarneri, CMA, am acting as scribe for [name], MD.   Documentation: I have reviewed the above documentation for accuracy and completeness, and I agree with the above.  [name], MD

## 2022-09-11 DIAGNOSIS — H9192 Unspecified hearing loss, left ear: Secondary | ICD-10-CM | POA: Diagnosis not present

## 2022-09-11 DIAGNOSIS — H9312 Tinnitus, left ear: Secondary | ICD-10-CM | POA: Diagnosis not present

## 2022-09-18 ENCOUNTER — Telehealth: Payer: Self-pay | Admitting: Dermatology

## 2022-09-18 NOTE — Telephone Encounter (Signed)
Patient called in needing some medical advise in regards to her hand, she advised that the medicated cream that she was prescribed is not working and she would like to know what she needs to do for the meantime while her follow up appointment comes around. Patient requested call back at [phone].

## 2022-09-20 ENCOUNTER — Other Ambulatory Visit: Payer: Self-pay | Admitting: Internal Medicine

## 2022-09-22 NOTE — Telephone Encounter (Signed)
LVM to return call.

## 2022-09-22 NOTE — Telephone Encounter (Signed)
Please call pt can let her know the next step would be to perform a skin bx before changing the therapy / cream.  If we have room in the schedule we can move up the appointment however if we do not, then we will perform it at her follow up apt

## 2022-09-23 ENCOUNTER — Ambulatory Visit: Payer: Medicare PPO | Admitting: Internal Medicine

## 2022-09-23 ENCOUNTER — Encounter: Payer: Self-pay | Admitting: Internal Medicine

## 2022-09-23 VITALS — BP 140/78 | HR 67 | Temp 98.0°F | Ht 67.0 in | Wt 133.0 lb

## 2022-09-23 DIAGNOSIS — H9313 Tinnitus, bilateral: Secondary | ICD-10-CM

## 2022-09-23 DIAGNOSIS — M79605 Pain in left leg: Secondary | ICD-10-CM

## 2022-09-23 DIAGNOSIS — R413 Other amnesia: Secondary | ICD-10-CM

## 2022-09-23 NOTE — Assessment & Plan Note (Signed)
Chronic and occasional

## 2022-09-23 NOTE — Assessment & Plan Note (Addendum)
Worse with the relative self isolation in the house Advised to intensify Chantella's social schedule, increase travel etc

## 2022-09-23 NOTE — Assessment & Plan Note (Addendum)
Chronic: Worse per patient, fluctuating symptoms Nl MRI, nl hearing Dr Jenne Pane - nl exam Discussed options -i.e., a referral to [facility], [facility]

## 2022-09-23 NOTE — Progress Notes (Signed)
Subjective:  Patient ID: Wendy Serrano, female    DOB: [birth year]  Age: 77 y.o. MRN: [id number]  CC: Tinnitus (Discomfort on left side of head)   HPI Wendy Serrano presents for L ear tinnitus, L head sensation She is here w/Wendy Serrano   Per recent Neuro visit:  "MMSE 25/30 at last visit, MoCA today 14/30, history does not support any evidence of major decline since last visit, she did stop driving on her own -After long discussion with patient and husband, will check ATN profile to screen for Alzheimer's disease, referral to neuropsychiatric evaluation to better categorize memory deficits -Continue Namenda 10 mg twice daily, Aricept 10 mg daily -Have suggested increasing brain stimulating activity, consistent physical exercise, healthy eating, drinking plenty of water"  Outpatient Medications Prior to Visit  Medication Sig Dispense Refill   amantadine (SYMMETREL) 100 MG capsule Take 100 mg by mouth 2 (two) times daily.     Apoaequorin (PREVAGEN PO) Take 1 capsule by mouth daily.     aspirin 81 MG chewable tablet Chew 81 mg by mouth daily.     b complex vitamins tablet Take 1 tablet by mouth daily. 100 tablet 3   betamethasone dipropionate 0.05 % cream Apply topically 2 (two) times daily. 45 g 0   calcium-vitamin D (OSCAL WITH D) 500-200 MG-UNIT per tablet Take 1 tablet by mouth 2 (two) times daily.     clotrimazole-betamethasone (LOTRISONE) cream Apply 1 Application topically 2 (two) times daily. Apply topically to the hands twice daily for up to two weeks 30 g 1   donepezil (ARICEPT) 10 MG tablet TAKE 1/2 TABLET AT BEDTIME FOR 1 MONTH, THEN TAKE 1 TABLET AT BEDTIME THEREAFTER (Patient taking differently: Take 10 mg by mouth at bedtime.) 90 tablet 3   FOLIC ACID PO Take 4 tablets by mouth daily.     glucosamine-chondroitin 500-400 MG tablet Take 1 tablet by mouth 2 (two) times daily.     hydroquinone 4 % cream Apply topically 2 (two) times daily. Apply to darkened area only at left  medial knee 2 x daily for 3 months then stop 28.35 g 2   Magnesium 400 MG TABS Take 1 tablet by mouth daily.     memantine (NAMENDA) 10 MG tablet Take 1 tablet (10 mg total) by mouth 2 (two) times daily. 180 tablet 1   methotrexate (RHEUMATREX) 2.5 MG tablet 6 tablets on Saturday  3   Multiple Vitamins-Minerals (OCUVITE ADULT 50+) CAPS Take 1 capsule by mouth daily.     prednisoLONE acetate (PRED FORTE) 1 % ophthalmic suspension Place 1 drop into both eyes daily.     pregabalin (LYRICA) 100 MG capsule Take 100 mg by mouth 2 (two) times daily.     SIMBRINZA 1-0.2 % SUSP Place 1 drop into both eyes 2 (two) times daily.  6   UNABLE TO FIND Take 2 capsules by mouth daily. Med Name: Lions mane     UNABLE TO FIND Take 2 capsules by mouth daily. Med Name: Kristen Cardinal- for cholesterol     No facility-administered medications prior to visit.    ROS: Review of Systems  Constitutional:  Negative for activity change, appetite change, chills, fatigue and unexpected weight change.  HENT:  Negative for congestion, mouth sores and sinus pressure.   Eyes:  Negative for visual disturbance.  Respiratory:  Negative for cough and chest tightness.   Gastrointestinal:  Negative for abdominal pain and nausea.  Genitourinary:  Negative for difficulty urinating, frequency and vaginal pain.  Musculoskeletal:  Negative for back pain and gait problem.  Skin:  Negative for pallor and rash.  Neurological:  Negative for dizziness, tremors, weakness, numbness and headaches.  Psychiatric/Behavioral:  Negative for confusion and sleep disturbance.     Objective:  BP (!) 140/78 (BP Location: Left Arm, Patient Position: Sitting, Cuff Size: Normal)   Pulse 67   Temp 98 F (36.7 C) (Oral)   Ht 5\' 7"  (1.702 m)   Wt 133 lb (60.3 kg)   SpO2 97%   BMI 20.83 kg/m   BP Readings from Last 3 Encounters:  09/25/22 111/72  09/23/22 (!) 140/78  08/28/22 117/77    Wt Readings from Last 3 Encounters:  09/23/22 133 lb (60.3 kg)   08/25/22 136 lb (61.7 kg)  08/01/22 132 lb (59.9 kg)    Physical Exam Constitutional:      General: She is not in acute distress.    Appearance: She is well-developed.  HENT:     Head: Normocephalic.     Right Ear: External ear normal.     Left Ear: External ear normal.     Nose: Nose normal.  Eyes:     General:        Right eye: No discharge.        Left eye: No discharge.     Conjunctiva/sclera: Conjunctivae normal.     Pupils: Pupils are equal, round, and reactive to light.  Neck:     Thyroid: No thyromegaly.     Vascular: No JVD.     Trachea: No tracheal deviation.  Cardiovascular:     Rate and Rhythm: Normal rate and regular rhythm.     Heart sounds: Normal heart sounds.  Pulmonary:     Effort: No respiratory distress.     Breath sounds: No stridor. No wheezing.  Abdominal:     General: Bowel sounds are normal. There is no distension.     Palpations: Abdomen is soft. There is no mass.     Tenderness: There is no abdominal tenderness. There is no guarding or rebound.  Musculoskeletal:        General: No tenderness.     Cervical back: Normal range of motion and neck supple. No rigidity.  Lymphadenopathy:     Cervical: No cervical adenopathy.  Skin:    Findings: No erythema or rash.  Neurological:     Cranial Nerves: No cranial nerve deficit.     Motor: No abnormal muscle tone.     Coordination: Coordination normal.     Deep Tendon Reflexes: Reflexes normal.  Psychiatric:        Behavior: Behavior normal.        Thought Content: Thought content normal.        Judgment: Judgment normal.      A total time of 45 minutes was spent preparing to see the patient, reviewing tests, x-rays, operative reports and other medical records.  Also, obtaining history and performing comprehensive physical exam.  Additionally, counseling the patient regarding the above listed issues.   Finally, documenting clinical information in the health records, coordination of care, educating  the patient. It is a complex case.   Lab Results  Component Value Date   WBC 3.6 (L) 03/06/2022   HGB 13.4 03/06/2022   HCT 40.2 03/06/2022   PLT 254.0 03/06/2022   GLUCOSE 83 03/06/2022   CHOL 208 (H) 08/22/2020   TRIG 51.0 08/22/2020   HDL 86.40 08/22/2020   LDLDIRECT 127.9 03/09/2013   LDLCALC 112 (H)  08/22/2020   ALT 25 03/06/2022   AST 32 03/06/2022   NA 141 03/06/2022   K 3.8 03/06/2022   CL 105 03/06/2022   CREATININE 0.88 03/06/2022   BUN 22 03/06/2022   CO2 29 03/06/2022   TSH 9.42 (H) 03/06/2022   INR 1.1 (H) 05/16/2015   HGBA1C 5.4 02/26/2011    VAS [place] ABI WITH/WO TBI  Result Date: 02/17/2022  LOWER EXTREMITY DOPPLER STUDY Patient Name:  Asilee Cheyney  Date of Exam:   02/17/2022 Medical Rec #: [id number]        Accession #:    [id number] Date of Birth: 09/03/1945        Patient Gender: F Patient Age:   73 years Exam Location:  [facility] Procedure:      VAS [place] ABI WITH/WO TBI Referring Phys: --------------------------------------------------------------------------------  Indications: Patient reports right leg doesn't feel normal. Episodes of numbness              to the bottom of the foot. Primary reports poor pulsese on exam. High Risk Factors: None.  Performing Technologist: Dorthula Matas RVS, RCS  Examination Guidelines: A complete evaluation includes at minimum, Doppler waveform signals and systolic blood pressure reading at the level of bilateral brachial, anterior tibial, and posterior tibial arteries, when vessel segments are accessible. Bilateral testing is considered an integral part of a complete examination. Photoelectric Plethysmograph (PPG) waveforms and toe systolic pressure readings are included as required and additional duplex testing as needed. Limited examinations for reoccurring indications may be performed as noted.  ABI Findings: +---------+------------------+-----+---------+--------+ Right    Rt Pressure (mmHg)IndexWaveform  Comment  +---------+------------------+-----+---------+--------+ Brachial 125                                      +---------+------------------+-----+---------+--------+ PTA      133               1.06 triphasic         +---------+------------------+-----+---------+--------+ DP       151               1.21 triphasic         +---------+------------------+-----+---------+--------+ Great Toe99                0.79                   +---------+------------------+-----+---------+--------+ +---------+------------------+-----+---------+-------+ Left     Lt Pressure (mmHg)IndexWaveform Comment +---------+------------------+-----+---------+-------+ Brachial 122                                     +---------+------------------+-----+---------+-------+ PTA      153               1.22 triphasic        +---------+------------------+-----+---------+-------+ DP       158               1.26 triphasic        +---------+------------------+-----+---------+-------+ Great Toe103               0.82                  +---------+------------------+-----+---------+-------+ +-------+-----------+-----------+------------+------------+ ABI/TBIToday's ABIToday's TBIPrevious ABIPrevious TBI +-------+-----------+-----------+------------+------------+ Right  1.21       0.79                                +-------+-----------+-----------+------------+------------+  Left   1.26       0.82                                +-------+-----------+-----------+------------+------------+   Summary: Right: Resting right ankle-brachial index is within normal range. The right toe-brachial index is normal. Left: Resting left ankle-brachial index is within normal range. The left toe-brachial index is normal. *See table(s) above for measurements and observations.  Electronically signed by Coral Else MD on 02/17/2022 at 3:18:03 PM.    Final     Assessment & Plan:   Problem List Items  Addressed This Visit     Tinnitus - Primary    Chronic: Worse per patient, fluctuating symptoms Nl MRI, nl hearing Dr Jenne Pane - nl exam Discussed options -i.e., a referral to [facility], [facility]       Memory difficulties    Worse with the relative self isolation in the house Advised to intensify Shakela's social schedule, increase travel etc      Pain of left lower extremity    Chronic and occasional         No orders of the defined types were placed in this encounter.     Follow-up: Return in about 3 months (around 12/24/2022) for a follow-up visit.  Sonda Primes, MD

## 2022-09-25 ENCOUNTER — Ambulatory Visit: Payer: Medicare PPO | Admitting: Dermatology

## 2022-09-25 ENCOUNTER — Encounter: Payer: Self-pay | Admitting: Dermatology

## 2022-09-25 VITALS — BP 111/72

## 2022-09-25 DIAGNOSIS — L810 Postinflammatory hyperpigmentation: Secondary | ICD-10-CM | POA: Diagnosis not present

## 2022-09-25 DIAGNOSIS — L309 Dermatitis, unspecified: Secondary | ICD-10-CM | POA: Diagnosis not present

## 2022-09-25 MED ORDER — TACROLIMUS 0.1 % EX OINT: TOPICAL_OINTMENT | Freq: Two times a day (BID) | CUTANEOUS | 2 refills | Status: AC

## 2022-09-25 NOTE — Patient Instructions (Addendum)
Treatment Plan:  Clotrimazole/Betamethasone 2 x daily for 2 weeks then stop. Start Tacrolimus ointment 2 x daily for 2 weeks then stop. Alternate medications every 2 weeks.  Laroche Posay Mela B3 Serum 1 x daily to areas of darker skin on the right thigh Hydroquinone at night    Due to recent changes in healthcare laws, you may see results of your pathology and/or laboratory studies on MyChart before the doctors have had a chance to review them. We understand that in some cases there may be results that are confusing or concerning to you. Please understand that not all results are received at the same time and often the doctors may need to interpret multiple results in order to provide you with the best plan of care or course of treatment. Therefore, we ask that you please give [place] 2 business days to thoroughly review all your results before contacting the office for clarification. Should we see a critical lab result, you will be contacted sooner.   If You Need Anything After Your Visit  If you have any questions or concerns for your doctor, please call our main line at [phone] If no one answers, please leave a voicemail as directed and we will return your call as soon as possible. Messages left after 4 pm will be answered the following business day.   You may also send [place] a message via MyChart. We typically respond to MyChart messages within 1-2 business days.  For prescription refills, please ask your pharmacy to contact our office. Our fax number is [phone].  If you have an urgent issue when the clinic is closed that cannot wait until the next business day, you can page your doctor at the number below.    Please note that while we do our best to be available for urgent issues outside of office hours, we are not available 24/7.   If you have an urgent issue and are unable to reach [place], you may choose to seek medical care at your doctor's office, retail clinic, urgent care center, or  emergency room.  If you have a medical emergency, please immediately call 911 or go to the emergency department. In the event of inclement weather, please call our main line at [phone] for an update on the status of any delays or closures.  Dermatology Medication Tips: Please keep the boxes that topical medications come in in order to help keep track of the instructions about where and how to use these. Pharmacies typically print the medication instructions only on the boxes and not directly on the medication tubes.   If your medication is too expensive, please contact our office at [phone] or send [place] a message through MyChart.   We are unable to tell what your co-pay for medications will be in advance as this is different depending on your insurance coverage. However, we may be able to find a substitute medication at lower cost or fill out paperwork to get insurance to cover a needed medication.   If a prior authorization is required to get your medication covered by your insurance company, please allow [place] 1-2 business days to complete this process.  Drug prices often vary depending on where the prescription is filled and some pharmacies may offer cheaper prices.  The website www.goodrx.com contains coupons for medications through different pharmacies. The prices here do not account for what the cost may be with help from insurance (it may be cheaper with your insurance), but the website can give you  the price if you did not use any insurance.  - You can print the associated coupon and take it with your prescription to the pharmacy.  - You may also stop by our office during regular business hours and pick up a GoodRx coupon card.  - If you need your prescription sent electronically to a different pharmacy, notify our office through [facility] or by phone at [phone]

## 2022-09-25 NOTE — Progress Notes (Signed)
   Follow-Up Visit   Subjective  Wendy Serrano is a 77 y.o. female who presents for the following:  Tinea Manuum vs Hand Dermatitis. She had to restart the Clotrimazole/Betamethasone a week ago. It does not itch. It is improving. She is getting a new patch on the left palm that started a week ago. She denied joint pain.   The following portions of the chart were reviewed this encounter and updated as appropriate: medications, allergies, medical history  Review of Systems:  No other skin or systemic complaints except as noted in HPI or Assessment and Plan.  Objective  Well appearing patient in no apparent distress; mood and affect are within normal limits.   A focused examination was performed of the following areas: hands  Relevant exam findings are noted in the Assessment and Plan.    Assessment & Plan   Tinea vs Hand Dermatitis      Exam: Improved, less scaly, and lighter in color.  Treatment Plan:  Clotrimazole/Betamethasone 2 x daily for 2 weeks then stop. Start Tacrolimus ointment 2 x daily for 2 weeks then stop. Alternate medications every 2 weeks.   Plan: Discussed doing a biopsy to untreated skin to rule out Psoriasis. Treatment options will include [facility] [place] if biopsy proves to be Psoriasis.  Pt declined bx today.  Will consider if she doesn't respond to adding in the tacrolimus   POST-INFLAMMATORY HYPERPIGMENTATION (PIH)   Exam: hyperpigmented macules and/or patches at the right thigh in areas of recent HSV infection   Discussed with patient: This is a benign condition that comes from having previous inflammation in the skin and will fade with time over months to sometimes years. Recommend daily sun protection including sunscreen SPF 30+ to sun-exposed areas. - Recommend treating any itchy or red areas on the skin quickly to prevent new areas of PIH. Treating with prescription medicines such as hydroquinone may help fade dark spots faster.    Treatment  Plan: Jeneen Rinks Mela B3 Serum 1 x daily to areas of darker skin on the right thigh Hydroquinone at night           Return in about 1 month (around 10/26/2022).  Jaclynn Guarneri, CMA, am acting as scribe for [employer], DO.   Documentation: I have reviewed the above documentation for accuracy and completeness, and I agree with the above.  [name], DO

## 2022-09-26 ENCOUNTER — Ambulatory Visit: Payer: Medicare PPO | Admitting: Internal Medicine

## 2022-10-01 ENCOUNTER — Other Ambulatory Visit: Payer: Self-pay | Admitting: Internal Medicine

## 2022-10-08 ENCOUNTER — Ambulatory Visit: Payer: Medicare PPO | Admitting: Neurology

## 2022-10-10 ENCOUNTER — Other Ambulatory Visit: Payer: Self-pay | Admitting: Internal Medicine

## 2022-10-10 NOTE — Telephone Encounter (Signed)
Pt called and asked why her refill for pregabalin (LYRICA) 100 MG capsule   was denied as she is needing a refill.  Pt asked to be called by the nurse to discuss her medication at [phone].

## 2022-10-10 NOTE — Telephone Encounter (Signed)
Called pt spoke w/ husband. Inform that rx been denied because MD that was reviewing refills on pt med list it states she suppose to be taking Pregabalin 100 mg twice a day, but she is receiving rx for 25 mg twice a day. Husband states the neurologist decrease to 25 mg twice a day, and even Dr. Posey Rea rx med for her before. Husband states they will get in contact with neurologist concerning refilling.Marland KitchenRaechel Serrano

## 2022-10-13 ENCOUNTER — Ambulatory Visit: Payer: Medicare PPO | Admitting: Dermatology

## 2022-10-13 ENCOUNTER — Other Ambulatory Visit: Payer: Self-pay | Admitting: Internal Medicine

## 2022-10-13 DIAGNOSIS — F0390 Unspecified dementia without behavioral disturbance: Secondary | ICD-10-CM | POA: Diagnosis not present

## 2022-10-14 ENCOUNTER — Telehealth: Payer: Self-pay | Admitting: Neurology

## 2022-10-14 ENCOUNTER — Ambulatory Visit: Payer: Medicare PPO | Admitting: Podiatry

## 2022-10-14 DIAGNOSIS — L245 Irritant contact dermatitis due to other chemical products: Secondary | ICD-10-CM | POA: Diagnosis not present

## 2022-10-14 DIAGNOSIS — G609 Hereditary and idiopathic neuropathy, unspecified: Secondary | ICD-10-CM

## 2022-10-14 MED ORDER — CLOTRIMAZOLE-BETAMETHASONE 1-0.05 % EX CREA: 1.0000 | TOPICAL_CREAM | Freq: Two times a day (BID) | CUTANEOUS | 0 refills | Status: DC

## 2022-10-14 NOTE — Telephone Encounter (Signed)
Prescription was discontinued on 01/06/2022 by Deatra James, RMA for the following reason: Patient Preference, but she is requesting refill. Per med list historical MD../lmb

## 2022-10-14 NOTE — Progress Notes (Signed)
  Subjective:  Patient ID: Wendy Serrano, female    DOB: Oct 12, 1945,  MRN: [id number]  Chief Complaint  Patient presents with   Callouses    est - right foot discoloration on the ball foot - spot is harder    77 y.o. female presents with the above complaint. History confirmed with patient.  She has noticed some spots on the ball of the foot darkening and starting to peel and crack.  Says it does not itch or burn, she notes that she has been using a new topical medication that is supposed to help nerve discomfort, her feet have been "feeling very funny"  Objective:  Physical Exam: warm, good capillary refill, no trophic changes or ulcerative lesions, normal DP and PT pulses, and abnormal sensory exam she has dry cracking peeling skin right plantar forefoot and mid arch left foot.  Assessment:   1. Idiopathic peripheral neuropathy   2. Irritant contact dermatitis due to other chemical products      Plan:  Patient was evaluated and treated and all questions answered.  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  I discussed that she can discuss this with neurology, she currently sees neurology at [facility].  Referral placed for neuropathy evaluation.  Does not appear to be painful but altered sensations  She has been using a nerve discomfort cream and I believe she is likely having contact dermatitis from this.  I recommend she discontinue this and I prescribed her Lotrisone cream as there does appear to be a tinea type component here as well.  Use twice daily.  Return as needed if it does not improve  Return if symptoms worsen or fail to improve.

## 2022-10-14 NOTE — Telephone Encounter (Signed)
Please advise on refill. I am unsure if you want to fill  Last office note does not mention this drug to continue

## 2022-10-14 NOTE — Telephone Encounter (Signed)
Marchelle Folks from [facility] reports they never received the Rx for the pregabalin (LYRICA) 100 MG capsule back in April, pt would still like this medication called in to this [facility]

## 2022-10-15 ENCOUNTER — Telehealth: Payer: Self-pay | Admitting: Internal Medicine

## 2022-10-15 NOTE — Telephone Encounter (Signed)
Pt called to see is she still need to take the pregabalin and tylenol for her issue. Pt also stated that the pregabalim mg is wrong it use to be 25 mg instead of 100mg . Please advise.

## 2022-10-15 NOTE — Telephone Encounter (Signed)
Per med list Pregalin 100 mg is on list but not refilled by MD. Pls advise.Marland KitchenRaechel Chute

## 2022-10-15 NOTE — Telephone Encounter (Signed)
Call from husband, wanting clarification on refill of lyrica and dosing.verbal permission given form wife to discuss with husband. Reviewed after visit summary and medication list with husband. He states she is only taking 25-50 mg lyrica as needed Advised I would review chart and call pharmacy and follow back up. Husband appreciative of call.  Call to pharmacy, [facility], spoke with Daisetta. Last script of Lyrica was sent in 11/04/21 25 mg from Dr. Posey Rea.  Call back to husband, reviewed information from pharmacy call. Medication list, and chart review. It appears that based on what husband is saying that the 100 mg lyrica was an entry error. I advised husband to follow back up with PCP and provide information we discussed. He states she does have an appointment coming up with Dr. Vickey Huger and I encouraged them to review medication list in detail at that appointment. He would also like to discuss at that appointment a [facility] neurology referral. I also advised I could put that appointment on the wait list. Husband appreciative of call.

## 2022-10-17 ENCOUNTER — Other Ambulatory Visit: Payer: Self-pay | Admitting: Podiatry

## 2022-10-17 MED ORDER — PREGABALIN 25 MG PO CAPS: 25.0000 mg | ORAL_CAPSULE | Freq: Two times a day (BID) | ORAL | 5 refills | Status: DC

## 2022-10-17 NOTE — Telephone Encounter (Signed)
I prescribed Lyrica 25 mg twice daily as needed in the past.  I renewed 25 mg prescription. (100 mg was historic entry).  Thanks

## 2022-10-23 DIAGNOSIS — Z1231 Encounter for screening mammogram for malignant neoplasm of breast: Secondary | ICD-10-CM | POA: Diagnosis not present

## 2022-10-23 LAB — HM MAMMOGRAPHY

## 2022-10-23 NOTE — Telephone Encounter (Signed)
This is a duplicate request.  It was prescribed one week ago.

## 2022-10-24 ENCOUNTER — Other Ambulatory Visit: Payer: Self-pay | Admitting: Podiatry

## 2022-10-27 ENCOUNTER — Ambulatory Visit: Payer: Medicare PPO | Admitting: Dermatology

## 2022-10-31 ENCOUNTER — Ambulatory Visit: Payer: Medicare PPO | Admitting: Podiatry

## 2022-10-31 ENCOUNTER — Telehealth (INDEPENDENT_AMBULATORY_CARE_PROVIDER_SITE_OTHER): Payer: Medicare PPO | Admitting: Podiatry

## 2022-10-31 DIAGNOSIS — L245 Irritant contact dermatitis due to other chemical products: Secondary | ICD-10-CM | POA: Diagnosis not present

## 2022-10-31 DIAGNOSIS — G609 Hereditary and idiopathic neuropathy, unspecified: Secondary | ICD-10-CM | POA: Diagnosis not present

## 2022-10-31 NOTE — Telephone Encounter (Signed)
Pt called in because her feet seem to be doing worse with the cream. Her husband stated and now looks like she has athletes foot. And wants to know what to do moving forward. Pt stated she will try and upload a picture to mychart.    Please advise

## 2022-10-31 NOTE — Progress Notes (Unsigned)
Subjective: Chief Complaint  Patient presents with   skin issue    Patient skin is peeling to the ball of right foot and mid arch of left foot. Patient is using the Beamethasone cream prescribed by Dr. Lilian Kapur. Denies any itchiness.    Old female presents office today with her husband for the above concerns of an acute appointment.  They are concerned because the area of the skin lesion on the left foot is not getting any better.  She has been using has been using the clotrimazole/betamethasone cream.  She had the same issue in her hands and that helped this but not helping the foot as much.  No open lesions or any drainage.  Objective: AAO x3, NAD On the plantar aspect of the left foot is area dry, peeling skin present.  There is no ascending erythema, drainage or pus there is no edema.  There is no open lesions. No pain with calf compression, swelling, warmth, erythema  Assessment: Skin lesion left foot  Plan: -All treatment options discussed with the patient including all alternatives, risks, complications.  -Medication of the current prescription medication: Add urea cream to help keep the area hydrated.  If no improvement to a stronger steroid cream and/or biopsy. -Previously has been referred to neurology by Dr. Lilian Kapur for neuropathy. -Patient encouraged to call the office with any questions, concerns, change in symptoms.    Wendy Serrano DPM

## 2022-10-31 NOTE — Telephone Encounter (Signed)
[facility] patient with h/o dementia. Husband called stating prescribed betamethasone cream is causing foot to look worse. Would like an urgent visit to be seen today. Dr. Ardelle Anton agreed to see patient today. Husband informed and was appreciative of work-in.

## 2022-11-03 ENCOUNTER — Ambulatory Visit: Payer: Medicare PPO | Admitting: Podiatry

## 2022-11-14 DIAGNOSIS — H35363 Drusen (degenerative) of macula, bilateral: Secondary | ICD-10-CM | POA: Diagnosis not present

## 2022-11-14 DIAGNOSIS — H35351 Cystoid macular degeneration, right eye: Secondary | ICD-10-CM | POA: Diagnosis not present

## 2022-11-14 DIAGNOSIS — H35372 Puckering of macula, left eye: Secondary | ICD-10-CM | POA: Diagnosis not present

## 2022-11-14 DIAGNOSIS — H01003 Unspecified blepharitis right eye, unspecified eyelid: Secondary | ICD-10-CM | POA: Diagnosis not present

## 2022-11-14 DIAGNOSIS — H01006 Unspecified blepharitis left eye, unspecified eyelid: Secondary | ICD-10-CM | POA: Diagnosis not present

## 2022-11-14 DIAGNOSIS — H40043 Steroid responder, bilateral: Secondary | ICD-10-CM | POA: Diagnosis not present

## 2022-11-14 DIAGNOSIS — H31092 Other chorioretinal scars, left eye: Secondary | ICD-10-CM | POA: Diagnosis not present

## 2022-11-14 DIAGNOSIS — Z79899 Other long term (current) drug therapy: Secondary | ICD-10-CM | POA: Diagnosis not present

## 2022-11-14 DIAGNOSIS — R7989 Other specified abnormal findings of blood chemistry: Secondary | ICD-10-CM | POA: Diagnosis not present

## 2022-11-14 DIAGNOSIS — H209 Unspecified iridocyclitis: Secondary | ICD-10-CM | POA: Diagnosis not present

## 2022-11-14 DIAGNOSIS — F0390 Unspecified dementia without behavioral disturbance: Secondary | ICD-10-CM | POA: Diagnosis not present

## 2022-11-25 ENCOUNTER — Ambulatory Visit (INDEPENDENT_AMBULATORY_CARE_PROVIDER_SITE_OTHER): Payer: Medicare PPO | Admitting: Dermatology

## 2022-11-25 ENCOUNTER — Encounter: Payer: Self-pay | Admitting: Dermatology

## 2022-11-25 VITALS — BP 120/76 | HR 83

## 2022-11-25 DIAGNOSIS — B353 Tinea pedis: Secondary | ICD-10-CM

## 2022-11-25 DIAGNOSIS — L810 Postinflammatory hyperpigmentation: Secondary | ICD-10-CM | POA: Diagnosis not present

## 2022-11-25 DIAGNOSIS — L309 Dermatitis, unspecified: Secondary | ICD-10-CM

## 2022-11-25 MED ORDER — KETOCONAZOLE 2 % EX CREA: 1.0000 | TOPICAL_CREAM | Freq: Two times a day (BID) | CUTANEOUS | 0 refills | Status: DC

## 2022-11-25 MED ORDER — GRISEOFULVIN ULTRAMICROSIZE 250 MG PO TABS: 250.0000 mg | ORAL_TABLET | Freq: Every day | ORAL | 0 refills | Status: DC

## 2022-11-25 NOTE — Patient Instructions (Addendum)
Thank you for visiting my office today. I appreciate your dedication to improving your health and am pleased to hear about the progress with your hands. Below are the key instructions we discussed to address the current issues with your feet and thigh:  - Medications and Treatments:   - Ketoconazole Cream: Apply twice daily to your feet and the affected spot on your thigh.   - Griseofulvin Oral Medication: Take one 250 mg tablet daily for four weeks to help clear the fungal infection from the inside.   - Zeasorb AF Powder: Use this antifungal powder in your shoes twice a week to prevent reinfection.  - Lifestyle Adjustments:   - Sun Protection: Apply sunscreen when wearing dresses or shorts outdoors to protect your skin.     - Follow-Up:   - Please return for a follow-up appointment in four weeks to reassess your treatment progress.  If you have any questions or concerns before our next appointment, do not hesitate to contact the office.               Dermatitits Treatment Plan: --Start Ketoconazole 2% cream twice a day to feet and spot on leg -Start Griseofulvin 250 mg tablet daily   Post-Inflammatory Hyperpigmentation -Continue [place] Posay Mela B3 Serum 1 x daily to areas of darker skin on the right thigh -Continue Hydroquinone at night    Due to recent changes in healthcare laws, you may see results of your pathology and/or laboratory studies on MyChart before the doctors have had a chance to review them. We understand that in some cases there may be results that are confusing or concerning to you. Please understand that not all results are received at the same time and often the doctors may need to interpret multiple results in order to provide you with the best plan of care or course of treatment. Therefore, we ask that you please give [place] 2 business days to thoroughly review all your results before contacting the office for clarification. Should we see a critical lab  result, you will be contacted sooner.   If You Need Anything After Your Visit  If you have any questions or concerns for your doctor, please call our main line at [phone] If no one answers, please leave a voicemail as directed and we will return your call as soon as possible. Messages left after 4 pm will be answered the following business day.   You may also send [place] a message via MyChart. We typically respond to MyChart messages within 1-2 business days.  For prescription refills, please ask your pharmacy to contact our office. Our fax number is [phone].  If you have an urgent issue when the clinic is closed that cannot wait until the next business day, you can page your doctor at the number below.    Please note that while we do our best to be available for urgent issues outside of office hours, we are not available 24/7.   If you have an urgent issue and are unable to reach [place], you may choose to seek medical care at your doctor's office, retail clinic, urgent care center, or emergency room.  If you have a medical emergency, please immediately call 911 or go to the emergency department. In the event of inclement weather, please call our main line at [phone] for an update on the status of any delays or closures.  Dermatology Medication Tips: Please keep the boxes that topical medications come in in order to help keep  track of the instructions about where and how to use these. Pharmacies typically print the medication instructions only on the boxes and not directly on the medication tubes.   If your medication is too expensive, please contact our office at [phone] or send [place] a message through MyChart.   We are unable to tell what your co-pay for medications will be in advance as this is different depending on your insurance coverage. However, we may be able to find a substitute medication at lower cost or fill out paperwork to get insurance to cover a needed medication.    If a prior authorization is required to get your medication covered by your insurance company, please allow [place] 1-2 business days to complete this process.  Drug prices often vary depending on where the prescription is filled and some pharmacies may offer cheaper prices.  The website www.goodrx.com contains coupons for medications through different pharmacies. The prices here do not account for what the cost may be with help from insurance (it may be cheaper with your insurance), but the website can give you the price if you did not use any insurance.  - You can print the associated coupon and take it with your prescription to the pharmacy.  - You may also stop by our office during regular business hours and pick up a GoodRx coupon card.  - If you need your prescription sent electronically to a different pharmacy, notify our office through [facility] or by phone at [phone]

## 2022-11-25 NOTE — Progress Notes (Signed)
   Follow-Up Visit   Subjective  Wendy Serrano is a 77 y.o. female who presents for the following: Hand Dermatitis   Wendy Serrano reports improvement in her hands but worsening of her feet since April. She has been seeing a podiatrist who prescribed clobetasol cream for her feet. She also mentions two spots on her thigh. Previously, she was using clotrimazole betamethasone cream, which was ineffective, leading to a switch to plain betamethasone.  The patient recently underwent surgery and is in the recovery phase. She has been using hydroquinone intermittently for a dark spot on her leg, which Dr. Onalee Hua attributes to circulation issues and inflammation from the rash. She has been advised to wear sunscreen when outside.  The following portions of the chart were reviewed this encounter and updated as appropriate: medications, allergies, medical history  Review of Systems:  No other skin or systemic complaints except as noted in HPI or Assessment and Plan.  Objective  Well appearing patient in no apparent distress; mood and affect are within normal limits.   A focused examination was performed of the following areas: Hands and feet.  Relevant exam findings are noted in the Assessment and Plan.  Exam: Erythematous plaques with mild scale                    Assessment & Plan   Hand Dermatitis - No active lesions on hands   Treatment Plan: -Avoid potential triggers such as detergents and cleaning supplies  2. Tinea Pedis    - Assessment: Condition has worsened since April.    - Plan: Discontinue clotrimazole betamethasone cream. Start ketoconazole cream, applying twice daily to feet. Begin oral griseofulvin 250 mg, taken once daily for four weeks. After two weeks of oral griseofulvin and ketoconazole cream treatment, begin treating shoes with Zeasorb AF antifungal powder twice weekly. Wash sneakers and wipe down other shoes as needed. Follow up in four weeks to  reassess if we will d/c oral griseo or continue for 2 more weeks.  3. Post-Inflammatory Hyperpigmentation -Continue Wendy Serrano 1 x daily to areas of darker skin on the right thigh -Continue Hydroquinone at night  -SPF daily   No follow-ups on file.  [name], CMA, am acting as scribe for [employer], DO.   Documentation: I have reviewed the above documentation for accuracy and completeness, and I agree with the above.  [name], DO

## 2022-11-26 ENCOUNTER — Ambulatory Visit: Payer: Medicare PPO | Admitting: Neurology

## 2022-11-26 ENCOUNTER — Encounter: Payer: Self-pay | Admitting: Neurology

## 2022-11-26 VITALS — BP 138/84 | HR 84 | Ht 68.0 in | Wt 136.8 lb

## 2022-11-26 DIAGNOSIS — G301 Alzheimer's disease with late onset: Secondary | ICD-10-CM | POA: Diagnosis not present

## 2022-11-26 DIAGNOSIS — F0280 Dementia in other diseases classified elsewhere without behavioral disturbance: Secondary | ICD-10-CM

## 2022-11-26 MED ORDER — RIVASTIGMINE 4.6 MG/24HR TD PT24: 4.6000 mg | MEDICATED_PATCH | Freq: Every day | TRANSDERMAL | 12 refills | Status: DC

## 2022-11-26 NOTE — Patient Instructions (Addendum)
Rivastigmine Patches What is this medication? RIVASTIGMINE (ri va STIG meen) treats memory loss and confusion (dementia) in people who have Alzheimer or Parkinson disease. It works by improving attention, memory, and the ability to engage in daily activities. This medicine may be used for other purposes; ask your health care provider or pharmacist if you have questions. COMMON BRAND NAME(S): Exelon Patch What should I tell my care team before I take this medication? They need to know if you have any of these conditions: Application site reaction during previous use of rivastigmine patch Heart disease Kidney disease Liver disease Lung or breathing disease, such as asthma Seizures Slow, irregular heartbeat Stomach or intestine disease, ulcers, or stomach bleeding Trouble passing urine An unusual or allergic reaction to rivastigmine, other medications, foods, dyes, or preservatives Pregnant or trying to get pregnant Breast-feeding How should I use this medication? This medication is for external use only. Use as directed on the prescription label at the same time every day. Always remove the old patch before you apply a new patch. Apply to skin right after removing the protective liner. Do not cut or trim the patch. Apply to an area of the upper arm, chest, or back that is clean, dry, and hairless. Avoid injured, irritated, oily, or calloused areas or where the patch will be rubbed by tight clothing or a waistband. Do not place over an area where lotion, cream, or powder was recently used. Press firmly in place until the edges stick well. To prevent skin irritation, do not apply to the same place more than once every 14 days. Keep using it unless your care team tells you to stop. Talk to your care team about the use of this medication in children. Special care may be needed. Overdosage: If you think you have taken too much of this medicine contact a poison control center or emergency room at  once. NOTE: This medicine is only for you. Do not share this medicine with others. What if I miss a dose? If you miss a dose, apply a new patch immediately. Apply the next patch at the usual time the next day after removing the previous patch. Do not apply 2 patches to make up for the missed one. If treatment is missed for 3 or more days, talk to your care team for further instructions. What may interact with this medication? Antihistamines for allergy, cough, and cold Atropine Certain medications for bladder problems, such as oxybutynin, tolterodine Certain medications for Parkinson disease, such as benztropine, trihexyphenidyl Certain medications for stomach problems, such as dicyclomine, hyoscyamine Certain medications for travel sickness, such as scopolamine Glycopyrrolate Ipratropium Medications that relax your muscles for surgery Other medications for Alzheimer disease This list may not describe all possible interactions. Give your health care provider a list of all the medicines, herbs, non-prescription drugs, or dietary supplements you use. Also tell them if you smoke, drink alcohol, or use illegal drugs. Some items may interact with your medicine. What should I watch for while using this medication? Visit your care team for regular checks on your progress. Tell your care team if your symptoms do not start to get better or if they get worse. This medication may affect your coordination, reaction time, or judgement. Do not drive or operate machinery until you know how this medication affects you. Sit up or stand slowly to reduce the risk of dizzy or fainting spells. Drinking alcohol with this medication can increase the risk of these side effects. Avoid saunas and prolonged  exposure to sunlight. You may bathe, swim, shower, or participate in any of your normal activities while wearing this patch. If the patch falls off, apply a new patch for the rest of the day, then replace the patch the  next day at the usual time. If you are going to have a magnetic resonance imaging (MRI) procedure, tell your MRI technician if you have this patch on your body. It must be removed before a MRI. What side effects may I notice from receiving this medication? Side effects that you should report to your care team as soon as possible: Allergic reactions--skin rash, itching, hives, swelling of the face, lips, tongue, or throat Seizures Slow heartbeat--dizziness, feeling faint or lightheaded, confusion, trouble breathing, unusual weakness or fatigue Uncontrolled and repetitive body movements, muscle stiffness or spasms, tremors or shaking, loss of balance or coordination, restlessness, shuffling walk, which may be signs of extrapyramidal symptoms (EPS) Side effects that usually do not require medical attention (report to your care team if they continue or are bothersome): Diarrhea Irritation at application site Loss of appetite with weight loss Nausea Vomiting This list may not describe all possible side effects. Call your doctor for medical advice about side effects. You may report side effects to FDA at 1-800-FDA-1088. Where should I keep my medication? Keep out of the reach of children and pets. Store between 15 and 30 degrees C (59 and 86 degrees F). Store in original pouch until just prior to use. Get rid of any unused medication after the expiration date. Dispose of used patches properly. Since used patches may still contain active medication, fold the patch in half so that it sticks to itself prior to disposal. To get rid of medications that are no longer needed or have expired: Take the medication to a medication take-back program. Check with your pharmacy or law enforcement to find a location. If you cannot return the medication, ask your pharmacist or care team how to get rid of this medication safely. NOTE: This sheet is a summary. It may not cover all possible information. If you have  questions about this medicine, talk to your doctor, pharmacist, or health care provider.  2024 Elsevier/Gold Standard (2021-06-24 00:00:00) Alzheimer's Disease Caregiver Guide Alzheimer's disease is a condition that makes a person: Forget things. Act differently. Have trouble paying attention and doing simple tasks. These things get worse with time. The tips below can help you care for the person. How to help manage lifestyle changes Tips to help with symptoms Be calm and patient. Give simple, short answers to questions. Avoid correcting the person in a negative way. Try not to take things personally, even if the person forgets your name. Do not argue with the person. This may make the person more upset. Tips to lessen frustration Make appointments and do daily tasks when the person is at his or her best. Take your time. Simple tasks may take longer. Allow plenty of time to complete tasks. Limit choices for the person. Involve the person in what you are doing. Keep things organized: Keep a daily routine. Organize medicines in a pillbox for each day of the week. Keep a calendar in a central location to remind the person of meetings or other activities. Avoid new or crowded places, if possible. Use simple words, short sentences, and a calm voice. Only give one direction at a time. Buy clothes and shoes that are easy to put on and take off. Try to change the subject if the person becomes  frustrated or angry. Tips to prevent injury  Keep floors clear. Remove rugs, magazine racks, and floor lamps. Keep hallways well-lit. Put a handrail and non-slip mat in the bathtub or shower. Put childproof locks on cabinets that have dangerous items in them. These items include medicine, alcohol, guns, toxic cleaning items, sharp tools, matches, and lighters. Put locks on doors where the person cannot see or reach them. This helps keep the person from going out of the house and getting lost. Be  ready for emergencies. Keep a list of emergency phone numbers and addresses close by. Remove car keys and lock garage doors so that the person does not try to drive. Bracelets may be worn that track location and identify the person as having memory problems. This should be worn at all times for safety. Tips for the future  Discuss financial and legal planning early. People with this disease have trouble managing their money as the disease gets worse. Get help from a professional. Talk about advance directives, safety, and daily care. Take these steps: Create a living will and choose a power of attorney. This is someone who can make decisions for the person with Alzheimer's disease when he or she can no longer do so. Discuss driving safety and when to stop driving. The person's doctor can help with this. If the person lives alone, make sure he or she is safe. Some people need extra help at home. Other people need more care at a nursing home or care center. How to recognize changes in the person's condition With this disease, memory problems and confusion slowly get worse. In time, the person may not know his or her friends and family members. The disease can also cause changes in behavior and mood, such as anxiety or anger. The person may see, hear, taste, smell, or feel things that are not real (hallucinate). These changes can come on all of a sudden. They may happen in response to something such as: Pain. An infection. Changes in temperature or noise. Too much stimulation. Feeling lost or scared. Medicines. Where to find support Find out about services that can provide short-term care (respite care). These can allow you to take a break when you need it. Join a support group near you. These groups can help you: Learn ways to manage stress. Share experiences with others. Get emotional comfort and support. Learn about caregiving as the disease gets worse. Know what community resources are  available. Where to find more information Alzheimer's Association: [employer] Contact a doctor if: The person has a fever. The person has a sudden behavior change that does not get better with calming strategies. The person is not able to take care of himself or herself at home. You are no longer able to care for the person. Get help right away if: The person has a sudden increase in confusion or new hallucinations. The person threatens you or anyone else, including himself or herself. Get help right away if you feel like your loved one may hurt himself or herself or others, or has thoughts about taking his or her own life. Go to your nearest emergency room or: Call your local emergency services (911 in the [place] Call the National Suicide Prevention Lifeline at 281-761-0176 or 988 in the [place] This is open 24 hours a day. Text the Crisis Text Line at 984 864 5827. Summary Alzheimer's disease causes a person to forget things. A person who has this condition may have trouble doing simple tasks. Take steps to keep  the person from getting hurt. Plan for future care. You can find support by joining a support group near you. This information is not intended to replace advice given to you by your health care provider. Make sure you discuss any questions you have with your health care provider. Document Revised: 11/21/2020 Document Reviewed: 08/15/2019 Elsevier Patient Education  2024 [employer] Amyloid Beta Protein Precursor Test Why am I having this test? The amyloid beta protein precursor test can be used to help diagnose Alzheimer's disease (AD) or certain other types of dementia. A health care provider may order this test if you or someone you care for has symptoms of dementia, which include: Memory loss. Behavioral changes. Decreased ability to perform daily functions. This test may be used along with a thorough exam and other methods to help determine the cause of the symptoms. The other  methods may include cognitive tests to assess memory or scanning tests of the brain to look for problems. What is being tested? This test measures the levels of the beta amyloid protein in the fluid surrounding the brain and spinal cord (cerebrospinal fluid, or CSF). Free beta amyloid proteins in the CSF have been shown to protect the brain. Levels of beta amyloid protein are decreased in the CSF of people with AD. What kind of sample is taken?  Samples of cerebrospinal fluid are required for this test. The samples are collected using a procedure in which a small needle is inserted into your lower back while you lie on your side. The needle is used to draw fluid from an area around the spinal cord. This procedure is called a lumbar puncture, or a spinal tap. The procedure is usually done in a hospital or clinic. The tests are run on the fluid obtained from the lumbar puncture. How are the results reported? Your test results will be reported as values. Your health care provider will compare your results to normal ranges that were established after testing a large group of people (reference values). Reference values may vary among labs and hospitals. For this test, a common reference value of beta amyloid protein is: Greater than 450 units/L. What do the results mean? Decreased levels of beta amyloid proteins in the CSF may indicate: Alzheimer's disease. Other forms of dementia. Talk with your health care provider about what your results mean. Questions to ask your health care provider Ask your health care provider, or the department that is doing the test: When will my results be ready? How will I get my results? What are my treatment options? What other tests do I need? What are my next steps? Summary This test can be used to help diagnose Alzheimer's disease (AD) and certain other types of dementia. Cerebrospinal fluid (CSF) samples are collected using a lumbar puncture procedure, and the  sample is tested to measure the level of beta amyloid protein. Levels of beta amyloid protein are decreased in the CSF of people with AD. This information is not intended to replace advice given to you by your health care provider. Make sure you discuss any questions you have with your health care provider. Document Revised: 09/12/2019 Document Reviewed: 09/12/2019 Elsevier Patient Education  2024 [employer].

## 2022-11-26 NOTE — Progress Notes (Signed)
Provider:  Melvyn Novas, MD  Primary Care Physician:  Wendy Garter, MD [address] [place] [address]     Referring Provider: Edwin Serrano, Dpm [address] [place],  [place] [address]          Chief Complaint according to patient   Patient presents with:     New Patient (Initial Visit)     Patient in room #2 with her husband. Patient states here today to discuss idiopathic peripheral neuropathy and new medication.       HISTORY OF PRESENT ILLNESS:  Wendy Serrano is a 77 y.o. female patient who is here for revisit 11/26/2022 for  ? The husband  stated Wendy Serrano is a to follow up on neuropsychological testing ???  Not here for  neuropathy. Chief concern according to patient :  She insists that she is here for neurocognitive testing follow up.   Wendy Serrano Dermott at [facility] wrote in May 2024: According to medical records, the patient was seen by neurology. This provider's note indicated that there was concern for memory loss. Previous MMSE was 23/30 and more recent MoCA score was 14/30. She underwent lab work for beta amyloid and tau, which indicated the possibility of Alzheimer's disease. She has been prescribed Aricept and Namenda. The patient was referred for neurocognitive testing to document current cognitive abilities, clarify diagnosis, and assist in treatment planning.  MEDICAL HISTORY: Birth and developmental history are benign. Any history of traumatic brain injury, stroke, or seizure was denied. Relevant labs were as follows:  TSH: Elevated in October 2023 (persistently elevated)  Free Thyroxine: WNL B12: Elevated Beta-amyloid 42/40 Ratio: Low (0.101) p-tau181: Elevated (2.07) RPR: Nonreactive in 2021  Neuroimaging: Head CT scan performed in November 2022 reportedly revealed generalized atrophy and chronic microvascular ischemia without acute abnormality.  A brain MRI scan performed in January 2022 reportedly revealed mild generalized  cortical atrophy as well as scattered T2/FLAIR hyperintense signal and confluent foci predominantly in the hemispheres and a few foci in the pons and cerebellum. This was thought most consistent with moderate chronic microvascular ischemic change.  HPI:  c Wendy Serrano is a 77 y.o.  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 Anne Serrano. Wendy. Serrano had reported memory troubles in the past and  today on 13 March 2022 we are testing her by Mini-Mental status exam.  She had used over-the-counter supplements in the past such as lying main mushroom and Prevagen.  In the meantime she has started on Namenda and is taking currently 5 mg twice a day.  Namenda for person of her body mass index should slowly be increased to 10 mg twice daily and I will be happy to change the prescription from 5-10 today.  Her BMI is 21.7, she weighs today in clothing 139 pounds, her blood pressure was 147/76, heart rate was not noted.  Her repeat memory test today showed 25 out of 30 points which is considered between a mild cognitive impairment and an early possible dementia.  I like to add that the deficits in this memory test were in the serial 7 math category and I can replace this with the spelling of the word backwards as an equivalent score.  She had no trouble writing a sentence copying an image, and on the 3 word recall she scored 2 which is actually pretty  good. So replacing the serial 7 test she scores a total of 28 out of 30 points and this is in normal range.  The patient was a professor for language arts and she states that she was never good with math.   She  mentioned a buzzing sound in the left ear, tinnitus. No scalp sensation abnormality.           . 09-11-2021: She is a primary patient of Wendy Wendy Serrano . She is here for a follow up w visit on memory care. She had just done a MMSE with my CMA and forgot to place the hands of the  clock into the drawing.  Wendy Serrano reports his wife is tuning people out - she doesn't process what she is told, comprehension is declining. She is taking lion mane mushroom extract and Prevagen - for almost a year. Gabapentin has improved dyseasthesias and she sleeps better.    04-29-2021: Followed by Wendy. Anne Serrano at [facility] for cognitive disorder, tinnitus, had 2 recent visits in ENT, PCP and last night ER-   Area of dyseasthesias , coin sized, crown of the head.  Gabapentin decreased sensation and she sleeps well. Tinnitus not bothering now.    The patient's husband felt that she also has some memory impairment today she was tested by Mini-Mental status exam and she scored only 22 out of 30 points which is concerning.  The only orientation question she missed was the date but she was clear about the day of the week month year or season.  She knew fully which places and location she is at, she had trouble with the serial sevens and missed step 3. She was able to spell backwards- I gave her 5 points, she was able to copy an image and draw a clock face.           Review of Systems: Out of a complete 14 system review, the patient complains of only the following symptoms, and all other reviewed systems are negative.:  Fatigue, sleepiness , snoring, fragmented sleep, Insomnia,    How likely are you to doze in the following situations: 0 = not likely, 1 = slight chance, 2 = moderate chance, 3 = high chance    Social History   Socioeconomic History   Marital status: Married    Spouse name: Wendy Serrano   Number of children: 0   Years of education: college   Highest education level: Master's degree (e.g., MA, MS, MEng, MEd, MSW, MBA)  Occupational History   Occupation: Retired  Tobacco Use   Smoking status: Never   Smokeless tobacco: Never  Vaping Use   Vaping status: Never Used  Substance and Sexual Activity   Alcohol use: No   Drug use: No   Sexual activity: Never    Birth  control/protection: Post-menopausal  Other Topics Concern   Not on file  Social History Narrative   Wendy Serrano college BA; MEd-teaching;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 They are doing well   Marriage is in good health and retirement is goodEnd-of-life: provided packet of information and forms for consideration (Oct'11)      Lives with husband   Right handed   Caffeine: zero   Social Determinants of Health   Financial Resource Strain: Low Risk  (04/08/2022)   Overall Financial Resource Strain (CARDIA)    Difficulty of Paying Living Expenses: Not hard at all  Food Insecurity:  No Food Insecurity (04/08/2022)   Hunger Vital Sign    Worried About Running Out of Food in the Last Year: Never true    Ran Out of Food in the Last Year: Never true  Transportation Needs: Patient Declined (07/31/2022)   PRAPARE - Transportation    Lack of Transportation (Medical): Patient declined    Lack of Transportation (Non-Medical): Patient declined  Physical Activity: Sufficiently Active (07/31/2022)   Exercise Vital Sign    Days of Exercise per Week: 3 days    Minutes of Exercise per Session: 50 min  Stress: No Stress Concern Present (07/31/2022)   [employer] of Occupational Health - Occupational Stress Questionnaire    Feeling of Stress : Not at all  Social Connections: Unknown (07/31/2022)   Social Connection and Isolation Panel [NHANES]    Frequency of Communication with Friends and Family: More than three times a week    Frequency of Social Gatherings with Friends and Family: More than three times a week    Attends Religious Services: More than 4 times per year    Active Member of Clubs or Organizations: Yes    Attends Banker Meetings: 1 to 4 times per year    Marital Status: Patient declined    Family History  Problem Relation Age of Onset   Prostate cancer Father         died   Pancreatic cancer Mother        died   Diabetes Mother    Hyperlipidemia Sister     Past Medical History:  Diagnosis Date   Allergic rhinitis    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   Menopausal symptoms    Renal cyst    bilateral   Rotator cuff tear    Yeast infection     Past Surgical History:  Procedure Laterality Date   ROTATOR CUFF REPAIR Left 08/2009   Left shoulder-rotator cuff repair [name])   WISDOM TOOTH EXTRACTION       Current Outpatient Medications on File Prior to Visit  Medication Sig Dispense Refill   amantadine (SYMMETREL) 100 MG capsule Take 100 mg by mouth 2 (two) times daily.     Apoaequorin (PREVAGEN PO) Take 1 capsule by mouth daily.     aspirin 81 MG chewable tablet Chew 81 mg by mouth daily.     b complex vitamins tablet Take 1 tablet by mouth daily. 100 tablet 3   betamethasone dipropionate 0.05 % cream Apply topically 2 (two) times daily. 45 g 0   calcium-vitamin D (OSCAL WITH D) 500-200 MG-UNIT per tablet Take 1 tablet by mouth 2 (two) times daily.     clotrimazole-betamethasone (LOTRISONE) cream APPLY TO AFFECTED AREA TWICE A DAY 60 g 0   donepezil (ARICEPT) 10 MG tablet TAKE 1/2 TABLET AT BEDTIME FOR 1 MONTH, THEN TAKE 1 TABLET AT BEDTIME THEREAFTER (Patient taking differently: Take 10 mg by mouth at bedtime.) 90 tablet 3   FOLIC ACID PO Take 4 tablets by mouth daily.     glucosamine-chondroitin 500-400 MG  tablet Take 1 tablet by mouth 2 (two) times daily.     griseofulvin (GRIS-PEG) 250 MG tablet Take 1 tablet (250 mg total) by mouth daily. 30 tablet 0   ketoconazole (NIZORAL) 2 %  cream Apply 1 Application topically 2 (two) times daily. 15 g 0   Magnesium 400 MG TABS Take 1 tablet by mouth daily.     memantine (NAMENDA) 10 MG tablet Take 1 tablet (10 mg total) by mouth 2 (two) times daily. 180 tablet 1   methotrexate (RHEUMATREX) 2.5 MG tablet 6 tablets on Saturday  3   Multiple Vitamins-Minerals (OCUVITE ADULT 50+) CAPS Take 1 capsule by mouth daily.     prednisoLONE acetate (PRED FORTE) 1 % ophthalmic suspension Place 1 drop into both eyes daily.     pregabalin (LYRICA) 25 MG capsule Take 1 capsule (25 mg total) by mouth 2 (two) times daily. 60 capsule 5   SIMBRINZA 1-0.2 % SUSP Place 1 drop into both eyes 2 (two) times daily.  6   tacrolimus (PROTOPIC) 0.1 % ointment Apply topically 2 (two) times daily. Apply 2 x daily to palms for 2 weeks then stop. Alternate with Clotrimazole/Betamethasone cream 100 g 2   UNABLE TO FIND Take 2 capsules by mouth daily. Med Name: Lions mane     UNABLE TO FIND Take 2 capsules by mouth daily. Med Name: Kristen Cardinal- for cholesterol     No current facility-administered medications on file prior to visit.    No Known Allergies   DIAGNOSTIC DATA (LABS, IMAGING, TESTING) - I reviewed patient records, labs, notes, testing and imaging myself where available.  Lab Results  Component Value Date   WBC 3.6 (L) 03/06/2022   HGB 13.4 03/06/2022   HCT 40.2 03/06/2022   MCV 95.3 03/06/2022   PLT 254.0 03/06/2022      Component Value Date/Time   NA 141 03/06/2022 0841   K 3.8 03/06/2022 0841   CL 105 03/06/2022 0841   CO2 29 03/06/2022 0841   GLUCOSE 83 03/06/2022 0841   BUN 22 03/06/2022 0841   CREATININE 0.88 03/06/2022 0841   CALCIUM 9.8 03/06/2022 0841   PROT 7.5 03/06/2022 0841   ALBUMIN 4.5 03/06/2022 0841   AST 32 03/06/2022 0841   ALT 25 03/06/2022 0841   ALKPHOS 70 03/06/2022 0841   BILITOT 0.4 03/06/2022 0841   GFRNONAA 111.51 02/19/2010 1100   GFRAA 81 02/02/2008 0955   Lab Results  Component Value Date   CHOL 208 (H)  08/22/2020   HDL 86.40 08/22/2020   LDLCALC 112 (H) 08/22/2020   LDLDIRECT 127.9 03/09/2013   TRIG 51.0 08/22/2020   CHOLHDL 2 08/22/2020   Lab Results  Component Value Date   HGBA1C 5.4 02/26/2011   Lab Results  Component Value Date   VITAMINB12 >1504 (H) 06/26/2021   Lab Results  Component Value Date   TSH 9.42 (H) 03/06/2022    PHYSICAL EXAM:  Today's Vitals   11/26/22 0927  BP: 138/84  Pulse: 84  Weight: 136 lb 12.8 oz (62.1 kg)  Height: 5\' 8"  (1.727 m)   Body mass index is 20.8 kg/m.   Wt Readings from Last 3 Encounters:  11/26/22 136 lb 12.8 oz (62.1 kg)  09/23/22 133 lb (60.3 kg)  08/25/22 136 lb (61.7 kg)     Ht Readings from Last 3 Encounters:  11/26/22 5\' 8"  (1.727 m)  09/23/22 5\' 7"  (1.702 m)  08/25/22 5\' 7"  (1.702 m)      General: The patient  is awake, alert and appears not in acute distress. The patient is well groomed. Head: Normocephalic, atraumatic. Neck is supple. Mallampati 2,  neck circumference:14 inches .  Dental status:  Cardiovascular:  Regular rate and cardiac rhythm by pulse,  without distended neck veins. Respiratory: Lungs are clear to auscultation.  Skin:  Without evidence of ankle edema, or rash. Trunk: The patient's posture is erect.   NEUROLOGIC EXAM: The patient is awake and alert, oriented to place and time.   Memory subjective described as impaired  Attention span & concentration ability appears normal.  Speech is fluent,  without  dysarthria, dysphonia or aphasia.  Mood and affect are appropriate.   Cranial nerves: no loss of smell or taste reported  Pupils are equal and briskly reactive to light. Extraocular movements in vertical and horizontal planes were intact and without nystagmus. No Diplopia. Visual fields by finger perimetry are intact. Hearing was intact to soft voice and finger rubbing.    Facial sensation intact to fine touch.  Facial motor strength is symmetric and tongue and uvula move midline.  Neck  ROM : rotation, tilt and flexion extension were normal for age and shoulder shrug was symmetrical.    Motor exam:  Symmetric bulk, tone and ROM.   Normal tone without cog wheeling, symmetric grip strength .   Coordination: Rapid alternating movements in the fingers/hands were of normal speed.  The Finger-to-nose maneuver was intact without evidence of ataxia, dysmetria or tremor.   Gait and station: Patient could rise unassisted from a seated position, walked without assistive device.   Deep tendon reflexes: in the  upper and lower extremities are symmetric and intact.      ASSESSMENT AND PLAN 77 y.o. year old female  here with:    1) Alzheimer's dementia , no recent MRI amyloid scan - is it needed ? I doubt it changes the prognosis.    2) increase from Aricept to [place] patch, and increase daily activity and social interactions.   3) MOCA is too low to allow driving.  I find her not too advanced for IV treatments. Leqembi MMSE next visit, and if 23 or higher do the amyloid PET scan and start IV treatments.    I plan to follow up either personally or through our NP within 4 months.   I would like to thank Plotnikov, Georgina Quint, MD and Wendy Serrano, Dpm [address] [place],  [place] [address] for allowing me to meet with and to take care of this pleasant patient.    After spending a total time of  40  minutes face to face and additional time for physical and neurologic examination, review of laboratory studies,  personal review of imaging studies, reports and results of other testing and review of referral information / records as far as provided in visit,   Electronically signed by: Wendy Novas, MD 11/26/2022 9:58 AM  [facility] Board certified by The [employer] of Sleep Medicine and Diplomate of the [employer] of Sleep Medicine. Board certified In Neurology through the ABPN, Fellow of the [employer] of  Neurology.

## 2022-12-11 ENCOUNTER — Telehealth: Payer: Self-pay | Admitting: Neurology

## 2022-12-11 NOTE — Telephone Encounter (Signed)
At 10:59 spouse left vm re: The patch pt is to start and the pill pt is to discontinue.  Spouse stated there is confusion and he would like a call t discuss.

## 2022-12-15 ENCOUNTER — Ambulatory Visit: Payer: Medicare PPO | Admitting: Podiatry

## 2022-12-15 ENCOUNTER — Ambulatory Visit: Payer: Medicare PPO | Admitting: Neurology

## 2022-12-15 ENCOUNTER — Encounter: Payer: Self-pay | Admitting: Podiatry

## 2022-12-15 DIAGNOSIS — M79674 Pain in right toe(s): Secondary | ICD-10-CM | POA: Diagnosis not present

## 2022-12-15 DIAGNOSIS — M79675 Pain in left toe(s): Secondary | ICD-10-CM

## 2022-12-15 DIAGNOSIS — L245 Irritant contact dermatitis due to other chemical products: Secondary | ICD-10-CM

## 2022-12-15 DIAGNOSIS — B351 Tinea unguium: Secondary | ICD-10-CM

## 2022-12-15 MED ORDER — TERBINAFINE HCL 250 MG PO TABS: 250.0000 mg | ORAL_TABLET | Freq: Every day | ORAL | 0 refills | Status: DC

## 2022-12-15 NOTE — Progress Notes (Signed)
Subjective:   Patient ID: Wendy Serrano, female   DOB: 77 y.o.   MRN: [id number]   HPI Patient presents with a lot of problems with her skin she is currently on griseofulvin with but is not sure she is even started it and would like something stronger if possible also has nails that get thick and can become bothersome for her and hard to cut   ROS      Objective:  Physical Exam  Neurovascular status intact 2 different problems with 1 peeling of the plantar skin of both hands and feet no drainage no erythema noted no other pathology with severely thickened nailbeds 1-5 both feet that she or her husband cannot take care of     Assessment:  Probability for fungal infection that have been confirmed by dermatologist 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

## 2022-12-18 DIAGNOSIS — Z01419 Encounter for gynecological examination (general) (routine) without abnormal findings: Secondary | ICD-10-CM | POA: Diagnosis not present

## 2022-12-18 DIAGNOSIS — Z780 Asymptomatic menopausal state: Secondary | ICD-10-CM | POA: Diagnosis not present

## 2022-12-19 ENCOUNTER — Ambulatory Visit: Payer: Medicare PPO

## 2022-12-19 VITALS — Ht 67.0 in | Wt 136.0 lb

## 2022-12-19 DIAGNOSIS — Z0000 Encounter for general adult medical examination without abnormal findings: Secondary | ICD-10-CM | POA: Diagnosis not present

## 2022-12-19 DIAGNOSIS — Z780 Asymptomatic menopausal state: Secondary | ICD-10-CM

## 2022-12-19 LAB — HM PAP SMEAR: HM Pap smear: NEGATIVE

## 2022-12-19 NOTE — Patient Instructions (Addendum)
Wendy Serrano , Thank you for taking time to come for your Medicare Wellness Visit. I appreciate your ongoing commitment to your health goals. Please review the following plan we discussed and let me know if I can assist you in the future.   Referrals/Orders/Follow-Ups/Clinician Recommendations: Remember to call [facility] imaging to schedule your Bone density scanning at [phone].  Please discuss with your PCP about a colonoscopy, as your last results were normal in 2014 and same for your mammogram.  Also it is time for you get your Flu vaccine.   Keep up the good work and was nice speaking with you.  This is a list of the screening recommended for you and due dates:  Health Maintenance  Topic Date Due   Flu Shot  08/10/2023*   Medicare Annual Wellness Visit  12/19/2023   DTaP/Tdap/Td vaccine (3 - Td or Tdap) 06/14/2028   Pneumonia Vaccine  Completed   DEXA scan (bone density measurement)  Completed   Hepatitis C Screening  Completed   HPV Vaccine  Aged Out   Colon Cancer Screening  Discontinued   COVID-19 Vaccine  Discontinued   Zoster (Shingles) Vaccine  Discontinued  *Topic was postponed. The date shown is not the original due date.    Advanced directives: (Copy Requested) Please bring a copy of your health care power of attorney and living will to the office to be added to your chart at your convenience.  Next Medicare Annual Wellness Visit scheduled for next year: Yes  Preventive Care 2 Years and Older, Female Preventive care refers to lifestyle choices and visits with your health care provider that can promote health and wellness. What does preventive care include? A yearly physical exam. This is also called an annual well check. Dental exams once or twice a year. Routine eye exams. Ask your health care provider how often you should have your eyes checked. Personal lifestyle choices, including: Daily care of your teeth and gums. Regular physical activity. Eating a healthy  diet. Avoiding tobacco and drug use. Limiting alcohol use. Practicing safe sex. Taking low-dose aspirin every day. Taking vitamin and mineral supplements as recommended by your health care provider. What happens during an annual well check? The services and screenings done by your health care provider during your annual well check will depend on your age, overall health, lifestyle risk factors, and family history of disease. Counseling  Your health care provider may ask you questions about your: Alcohol use. Tobacco use. Drug use. Emotional well-being. Home and relationship well-being. Sexual activity. Eating habits. History of falls. Memory and ability to understand (cognition). Work and work Astronomer. Reproductive health. Screening  You may have the following tests or measurements: Height, weight, and BMI. Blood pressure. Lipid and cholesterol levels. These may be checked every 5 years, or more frequently if you are over 30 years old. Skin check. Lung cancer screening. You may have this screening every year starting at age 78 if you have a 30-pack-year history of smoking and currently smoke or have quit within the past 15 years. Fecal occult blood test (FOBT) of the stool. You may have this test every year starting at age 3. Flexible sigmoidoscopy or colonoscopy. You may have a sigmoidoscopy every 5 years or a colonoscopy every 10 years starting at age 44. Hepatitis C blood test. Hepatitis B blood test. Sexually transmitted disease (STD) testing. Diabetes screening. This is done by checking your blood sugar (glucose) after you have not eaten for a while (fasting). You may have  this done every 1-3 years. Bone density scan. This is done to screen for osteoporosis. You may have this done starting at age 62. Mammogram. This may be done every 1-2 years. Talk to your health care provider about how often you should have regular mammograms. Talk with your health care provider about  your test results, treatment options, and if necessary, the need for more tests. Vaccines  Your health care provider may recommend certain vaccines, such as: Influenza vaccine. This is recommended every year. Tetanus, diphtheria, and acellular pertussis (Tdap, Td) vaccine. You may need a Td booster every 10 years. Zoster vaccine. You may need this after age 50. Pneumococcal 13-valent conjugate (PCV13) vaccine. One dose is recommended after age 40. Pneumococcal polysaccharide (PPSV23) vaccine. One dose is recommended after age 73. Talk to your health care provider about which screenings and vaccines you need and how often you need them. This information is not intended to replace advice given to you by your health care provider. Make sure you discuss any questions you have with your health care provider. Document Released: 05/25/2015 Document Revised: 01/16/2016 Document Reviewed: 02/27/2015 Elsevier Interactive Patient Education  2017 [employer].  Fall Prevention in the Home Falls can cause injuries. They can happen to people of all ages. There are many things you can do to make your home safe and to help prevent falls. What can I do on the outside of my home? Regularly fix the edges of walkways and driveways and fix any cracks. Remove anything that might make you trip as you walk through a door, such as a raised step or threshold. Trim any bushes or trees on the path to your home. Use bright outdoor lighting. Clear any walking paths of anything that might make someone trip, such as rocks or tools. Regularly check to see if handrails are loose or broken. Make sure that both sides of any steps have handrails. Any raised decks and porches should have guardrails on the edges. Have any leaves, snow, or ice cleared regularly. Use sand or salt on walking paths during winter. Clean up any spills in your garage right away. This includes oil or grease spills. What can I do in the bathroom? Use  night lights. Install grab bars by the toilet and in the tub and shower. Do not use towel bars as grab bars. Use non-skid mats or decals in the tub or shower. If you need to sit down in the shower, use a plastic, non-slip stool. Keep the floor dry. Clean up any water that spills on the floor as soon as it happens. Remove soap buildup in the tub or shower regularly. Attach bath mats securely with double-sided non-slip rug tape. Do not have throw rugs and other things on the floor that can make you trip. What can I do in the bedroom? Use night lights. Make sure that you have a light by your bed that is easy to reach. Do not use any sheets or blankets that are too big for your bed. They should not hang down onto the floor. Have a firm chair that has side arms. You can use this for support while you get dressed. Do not have throw rugs and other things on the floor that can make you trip. What can I do in the kitchen? Clean up any spills right away. Avoid walking on wet floors. Keep items that you use a lot in easy-to-reach places. If you need to reach something above you, use a strong step stool  that has a grab bar. Keep electrical cords out of the way. Do not use floor polish or wax that makes floors slippery. If you must use wax, use non-skid floor wax. Do not have throw rugs and other things on the floor that can make you trip. What can I do with my stairs? Do not leave any items on the stairs. Make sure that there are handrails on both sides of the stairs and use them. Fix handrails that are broken or loose. Make sure that handrails are as long as the stairways. Check any carpeting to make sure that it is firmly attached to the stairs. Fix any carpet that is loose or worn. Avoid having throw rugs at the top or bottom of the stairs. If you do have throw rugs, attach them to the floor with carpet tape. Make sure that you have a light switch at the top of the stairs and the bottom of the  stairs. If you do not have them, ask someone to add them for you. What else can I do to help prevent falls? Wear shoes that: Do not have high heels. Have rubber bottoms. Are comfortable and fit you well. Are closed at the toe. Do not wear sandals. If you use a stepladder: Make sure that it is fully opened. Do not climb a closed stepladder. Make sure that both sides of the stepladder are locked into place. Ask someone to hold it for you, if possible. Clearly mark and make sure that you can see: Any grab bars or handrails. First and last steps. Where the edge of each step is. Use tools that help you move around (mobility aids) if they are needed. These include: Canes. Walkers. Scooters. Crutches. Turn on the lights when you go into a dark area. Replace any light bulbs as soon as they burn out. Set up your furniture so you have a clear path. Avoid moving your furniture around. If any of your floors are uneven, fix them. If there are any pets around you, be aware of where they are. Review your medicines with your doctor. Some medicines can make you feel dizzy. This can increase your chance of falling. Ask your doctor what other things that you can do to help prevent falls. This information is not intended to replace advice given to you by your health care provider. Make sure you discuss any questions you have with your health care provider. Document Released: 02/22/2009 Document Revised: 10/04/2015 Document Reviewed: 06/02/2014 Elsevier Interactive Patient Education  2017 [employer].

## 2022-12-19 NOTE — Progress Notes (Addendum)
Subjective:   Wendy Serrano is a 77 y.o. female who presents for Medicare Annual (Subsequent) preventive examination.  Visit Complete: Virtual  I connected with  Wendy Serrano on 12/19/22 by a audio enabled telemedicine application and verified that I am speaking with the correct person using two identifiers.  Patient Location: Home  Provider Location: Home Office  I discussed the limitations of evaluation and management by telemedicine. The patient expressed understanding and agreed to proceed.  Vital Signs: Vital signs are patient reported.   Review of Systems   Cardiac Risk Factors include: advanced age (>71men, >55 women);Other (see comment), Risk factor comments: Atherosclerosis of Aorta, Osteoporosis     Objective:    Today's Vitals   12/19/22 1428  Weight: 136 lb (61.7 kg)  Height: 5\' 7"  (1.702 m)   Body mass index is 21.3 kg/m.     12/19/2022    3:05 PM 04/08/2022    3:59 PM 04/19/2021   10:13 AM 03/29/2021    2:43 PM 03/28/2021    2:47 AM 12/07/2019    3:59 PM 06/01/2015   11:40 AM  Advanced Directives  Does Patient Have a Medical Advance Directive? Yes No No Yes No Yes No  Type of Estate agent of [place];Living will   Healthcare Power of [place];Living will  Healthcare Power of [place];Living will   Does patient want to make changes to medical advance directive?      No - Patient declined   Copy of Healthcare Power of Attorney in Chart? No - copy requested   No - copy requested  No - copy requested   Would patient like information on creating a medical advance directive?  No - Patient declined     Yes - Educational materials given    Current Medications (verified) Outpatient Encounter Medications as of 12/19/2022  Medication Sig   amantadine (SYMMETREL) 100 MG capsule Take 100 mg by mouth 2 (two) times daily.   Apoaequorin (PREVAGEN PO) Take 1 capsule by mouth daily.   aspirin 81 MG chewable tablet Chew 81 mg by mouth daily.   b  complex vitamins tablet Take 1 tablet by mouth daily.   betamethasone dipropionate 0.05 % cream Apply topically 2 (two) times daily.   calcium-vitamin D (OSCAL WITH D) 500-200 MG-UNIT per tablet Take 1 tablet by mouth 2 (two) times daily.   clotrimazole-betamethasone (LOTRISONE) cream APPLY TO AFFECTED AREA TWICE A DAY   FOLIC ACID PO Take 4 tablets by mouth daily.   glucosamine-chondroitin 500-400 MG tablet Take 1 tablet by mouth 2 (two) times daily.   griseofulvin (GRIS-PEG) 250 MG tablet Take 1 tablet (250 mg total) by mouth daily.   ketoconazole (NIZORAL) 2 % cream Apply 1 Application topically 2 (two) times daily.   Magnesium 400 MG TABS Take 1 tablet by mouth daily.   memantine (NAMENDA) 10 MG tablet Take 1 tablet (10 mg total) by mouth 2 (two) times daily.   methotrexate (RHEUMATREX) 2.5 MG tablet 6 tablets on Saturday   Multiple Vitamins-Minerals (OCUVITE ADULT 50+) CAPS Take 1 capsule by mouth daily.   prednisoLONE acetate (PRED FORTE) 1 % ophthalmic suspension Place 1 drop into both eyes daily.   pregabalin (LYRICA) 25 MG capsule Take 1 capsule (25 mg total) by mouth 2 (two) times daily.   rivastigmine (EXELON) 4.6 mg/24hr Place 1 patch (4.6 mg total) onto the skin daily.   SIMBRINZA 1-0.2 % SUSP Place 1 drop into both eyes 2 (two) times daily.   tacrolimus (  PROTOPIC) 0.1 % ointment Apply topically 2 (two) times daily. Apply 2 x daily to palms for 2 weeks then stop. Alternate with Clotrimazole/Betamethasone cream   terbinafine (LAMISIL) 250 MG tablet Take 1 tablet (250 mg total) by mouth daily.   UNABLE TO FIND Take 2 capsules by mouth daily. Med Name: Lions mane   UNABLE TO FIND Take 2 capsules by mouth daily. Med Name: Kristen Cardinal- for cholesterol   No facility-administered encounter medications on file as of 12/19/2022.    Allergies (verified) Patient has no known allergies.   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   Menopausal symptoms    Renal cyst    bilateral   Rotator cuff tear    Yeast infection    Past Surgical History:  Procedure Laterality Date   ROTATOR CUFF REPAIR Left 08/2009   Left shoulder-rotator cuff repair Wendy Serrano)   WISDOM TOOTH EXTRACTION     Family History  Problem Relation Age of Onset   Prostate cancer Father        died   Pancreatic cancer Mother        died   Diabetes Mother    Hyperlipidemia Sister    Social History   Socioeconomic History   Marital status: Married    Spouse name: [name]   Number of children: 0   Years of education: college   Highest education level: Master's degree (e.g., MA, MS, MEng, MEd, MSW, MBA)  Occupational History   Occupation: Retired  Tobacco Use   Smoking status: Never   Smokeless tobacco: Never  Vaping Use   Vaping status: Never Used  Substance and Sexual Activity   Alcohol use: No   Drug use: No   Sexual activity: Never    Birth control/protection: Post-menopausal  Other Topics Concern   Not on file  Social History Narrative   Wendy Serrano college BA; MEd-teaching;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 They are doing well   Marriage is in good health and retirement is goodEnd-of-life: provided packet of information and forms for consideration (Oct'11)      Lives with husband   Right handed   Caffeine: zero   Social Determinants of Health   Financial Resource Strain: Low Risk  (12/19/2022)   Overall Financial Resource Strain (CARDIA)    Difficulty of Paying Living  Expenses: Not hard at all  Food Insecurity: No Food Insecurity (12/19/2022)   Hunger Vital Sign    Worried About Running Out of Food in the Last Year: Never true    Ran Out of Food in the Last Year: Never true  Transportation Needs: No Transportation Needs (12/19/2022)   PRAPARE - Administrator, Civil Service (Medical): No    Lack of Transportation (Non-Medical): No  Physical Activity: Sufficiently Active (12/19/2022)   Exercise Vital Sign    Days of Exercise per Week: 3 days  Minutes of Exercise per Session: 50 min  Stress: No Stress Concern Present (12/19/2022)   [employer] of Occupational Health - Occupational Stress Questionnaire    Feeling of Stress : Not at all  Social Connections: Socially Integrated (12/19/2022)   Social Connection and Isolation Panel [NHANES]    Frequency of Communication with Friends and Family: More than three times a week    Frequency of Social Gatherings with Friends and Family: Never    Attends Religious Services: 1 to 4 times per year    Active Member of [employer] or Organizations: Yes    Attends Banker Meetings: 1 to 4 times per year    Marital Status: Married    Tobacco Counseling Counseling given: Not Answered   Clinical Intake:  Pre-visit preparation completed: Yes  Pain : No/denies pain     BMI - recorded: 21.3 Nutritional Status: BMI of 19-24  Normal Nutritional Risks: None Diabetes: No  How often do you need to have someone help you when you read instructions, pamphlets, or other written materials from your doctor or pharmacy?: 1 - Never  Interpreter Needed?: No  Information entered by :: Wendy Serrano, RMA   Activities of Daily Living    12/19/2022    3:03 PM 04/08/2022    3:55 PM  In your present state of health, do you have any difficulty performing the following activities:  Hearing? 0 0  Vision? 0 0  Difficulty concentrating or making decisions? 0 1  Walking or climbing stairs? 0 0  Dressing or  bathing? 0 0  Doing errands, shopping? 0 0  Preparing Food and eating ? N N  Using the Toilet? N N  In the past six months, have you accidently leaked urine? N N  Do you have problems with loss of bowel control? N N  Managing your Medications? N N  Managing your Finances? N N  Housekeeping or managing your Housekeeping? N N    Patient Care Team: Plotnikov, Georgina Quint, MD as PCP - General (Internal Medicine) Pennie Rushing, Maris Berger, MD (Inactive) (Obstetrics and Gynecology) Eldred Manges, MD (Orthopedic Surgery) Nadyne Coombes, MD (Ophthalmology) Durene Romans, MD as Consulting Physician (Orthopedic Surgery) Eber Jones, MD as Referring Physician (Ophthalmology) Sharee Pimple, MD as Referring Physician (Ophthalmology) Maxie Better, MD as Consulting Physician (Obstetrics and Gynecology) Dohmeier, Porfirio Mylar, MD as Consulting Physician (Neurology) Christia Reading, MD as Consulting Physician (Otolaryngology)  Indicate any recent Medical Services you may have received from other than [facility] providers in the past year (date may be approximate).     Assessment:   This is a routine wellness examination for Wendy Serrano.  Hearing/Vision screen Hearing Screening - Comments:: Denies hearing difficulties   Vision Screening - Comments:: Wears eyeglasses  Dietary issues and exercise activities discussed:     Goals Addressed   None   Depression Screen    12/19/2022    3:08 PM 09/23/2022    9:24 AM 08/25/2022    1:21 PM 07/09/2022    3:58 PM 06/12/2022    1:33 PM 04/14/2022   10:11 AM 04/08/2022    3:51 PM  PHQ 2/9 Scores  PHQ - 2 Score 0 0 0 0 0 0 1  PHQ- 9 Score 0   0 0 0     Fall Risk    12/19/2022    3:05 PM 09/23/2022    9:23 AM 07/09/2022    3:58 PM 06/12/2022    1:33 PM 06/04/2022    9:31 AM  Fall Risk   Falls in the past year? 0 0 0 0 0  Number falls in past yr: 0 0 0 0 0  Injury with Fall? 0 0 0 0 0  Risk for fall due to : No Fall Risks No Fall Risks No Fall Risks No Fall Risks No  Fall Risks  Follow up Falls prevention discussed Falls evaluation completed Falls evaluation completed Falls evaluation completed Falls evaluation completed    MEDICARE RISK AT HOME:  Medicare Risk at Home - 12/19/22 1505     Any stairs in or around the home? Yes    If so, are there any without handrails? Yes    Home free of loose throw rugs in walkways, pet beds, electrical cords, etc? Yes    Adequate lighting in your home to reduce risk of falls? Yes    Life alert? No    Use of a cane, walker or w/c? No    Grab bars in the bathroom? No    Shower chair or bench in shower? Yes    Elevated toilet seat or a handicapped toilet? Yes             TIMED UP AND GO:  Was the test performed?  No    Cognitive Function:    03/13/2022   10:41 AM 09/11/2021    2:34 PM 04/29/2021   10:21 AM 11/01/2020   10:04 AM 05/03/2020   10:00 AM  MMSE - Mini Mental State Exam  Orientation to time 5 5 4 4 5   Orientation to Place 5 5 5 5 5   Registration 3 3 3 3 3   Attention/ Calculation 2 5 2 1 5   Recall 2 1 1 1 2   Language- name 2 objects 2 2 2 2 2   Language- repeat 1 0 0 1 1  Language- follow 3 step command 2 3 3 3 3   Language- read & follow direction 1 1 1 1 1   Write a sentence 1 1 1 1 1   Copy design 1 0 0 1 1  Total score 25 26 22 23 29       08/12/2022    8:20 AM  Montreal Cognitive Assessment   Visuospatial/ Executive (0/5) 1  Naming (0/3) 3  Attention: Read list of digits (0/2) 2  Attention: Read list of letters (0/1) 1  Attention: Serial 7 subtraction starting at 100 (0/3) 1  Language: Repeat phrase (0/2) 1  Language : Fluency (0/1) 0  Abstraction (0/2) 2  Delayed Recall (0/5) 0  Orientation (0/6) 3  Total 14      12/19/2022    3:06 PM 04/08/2022    4:01 PM  6CIT Screen  What Year? 0 points 0 points  What month? 0 points 0 points  What time? 0 points 0 points  Count back from 20 0 points 0 points  Months in reverse 0 points 4 points  Repeat phrase 0 points 2 points   Total Score 0 points 6 points    Immunizations Immunization History  Administered Date(s) Administered   COVID-19, mRNA, vaccine(Comirnaty)12 years and older 02/04/2022   Fluad Quad(high Dose 65+) 01/12/2019, 02/04/2022   Influenza Split 02/26/2011, 03/01/2012   Influenza Whole 02/10/2008, 02/15/2009, 02/19/2010   Influenza, High Dose Seasonal PF 03/09/2013, 03/21/2015, 04/01/2016, 03/03/2017, 01/23/2021   Influenza,inj,Quad PF,6+ Mos 05/10/2014, 02/13/2018   PFIZER(Purple Top)SARS-COV-2 Vaccination 05/25/2019, 06/15/2019, 02/04/2020, 08/11/2020   Pfizer Covid-19 Vaccine Bivalent Booster 47yrs & up 01/23/2021   Pneumococcal Conjugate-13 03/23/2013   Pneumococcal Polysaccharide-23  02/19/2010, 05/18/2013   Td 02/10/2008   Td (Adult), 2 Lf Tetanus Toxid, Preservative Free 02/10/2008   Tdap 06/14/2018   Zoster, Live 07/04/2009    TDAP status: Up to date  Flu Vaccine status: Up to date  Pneumococcal vaccine status: Up to date  Covid-19 vaccine status: Completed vaccines  Qualifies for Shingles Vaccine? Yes   Zostavax completed Yes   Shingrix Completed?: No.    Education has been provided regarding the importance of this vaccine. Patient has been advised to call insurance company to determine out of pocket expense if they have not yet received this vaccine. Advised may also receive vaccine at local pharmacy or Health Dept. Verbalized acceptance and understanding.  Screening Tests Health Maintenance  Topic Date Due   INFLUENZA VACCINE  08/10/2023 (Originally 12/11/2022)   Medicare Annual Wellness (AWV)  12/19/2023   DTaP/Tdap/Td (3 - Td or Tdap) 06/14/2028   Pneumonia Vaccine 35+ Years old  Completed   DEXA SCAN  Completed   Hepatitis C Screening  Completed   HPV VACCINES  Aged Out   Colonoscopy  Discontinued   COVID-19 Vaccine  Discontinued   Zoster Vaccines- Shingrix  Discontinued    Health Maintenance  There are no preventive care reminders to display for this  patient.   Colorectal cancer screening: Referral to GI placed 12/19/2022. Pt aware the office will call re: appt.  Mammogram status: Completed 09/2022. Repeat every year  Bone Density status: Ordered 12/19/2022. Pt provided with contact info and advised to call to schedule appt.  Lung Cancer Screening: (Low Dose CT Chest recommended if Age 29-80 years, 20 pack-year currently smoking OR have quit w/in 15years.) does not qualify.   Lung Cancer Screening Referral: N/A  Additional Screening:  Hepatitis C Screening: does qualify; Completed 06/06/2016  Vision Screening: Recommended annual ophthalmology exams for early detection of glaucoma and other disorders of the eye. Is the patient up to date with their annual eye exam?  No  Who is the provider or what is the name of the office in which the patient attends annual eye exams? N/A If pt is not established with a provider, would they like to be referred to a provider to establish care? Yes .   Dental Screening: Recommended annual dental exams for proper oral hygiene   Community Resource Referral / Chronic Care Management: CRR required this visit?  No   CCM required this visit?  No     Plan:     I have personally reviewed and noted the following in the patient's chart:   Medical and social history Use of alcohol, tobacco or illicit drugs  Current medications and supplements including opioid prescriptions. Patient is not currently taking opioid prescriptions. Functional ability and status Nutritional status Physical activity Advanced directives List of other physicians Hospitalizations, surgeries, and ER visits in previous 12 months Vitals Screenings to include cognitive, depression, and falls Referrals and appointments  In addition, I have reviewed and discussed with patient certain preventive protocols, quality metrics, and best practice recommendations. A written personalized care plan for preventive services as well as  general preventive health recommendations were provided to patient.     Wendy Serrano L Wendy Serrano, CMA   12/19/2022   After Visit Summary: (MyChart) Due to this being a telephonic visit, the after visit summary with patients personalized plan was offered to patient via MyChart   Nurse Notes: Patient is due for a DEXA and referral has been placed today.  She is wanting a coloscopy.  I  informed the patient of age range and her results were normal in 2014.  She would like to discuss further with Dr. Posey Rea.  Patient is thinking about getting the Shingrix  2 does vaccine, as she has only had the Zoster in 2011.   Medical screening examination/treatment/procedure(s) were performed by non-physician practitioner and as supervising physician I was immediately available for consultation/collaboration.  I agree with above. Wendy Shoe, MD

## 2022-12-29 ENCOUNTER — Other Ambulatory Visit: Payer: Self-pay | Admitting: Neurology

## 2022-12-30 ENCOUNTER — Ambulatory Visit (INDEPENDENT_AMBULATORY_CARE_PROVIDER_SITE_OTHER): Payer: Medicare PPO | Admitting: Dermatology

## 2022-12-30 DIAGNOSIS — L309 Dermatitis, unspecified: Secondary | ICD-10-CM

## 2022-12-30 DIAGNOSIS — L810 Postinflammatory hyperpigmentation: Secondary | ICD-10-CM | POA: Diagnosis not present

## 2022-12-30 DIAGNOSIS — B353 Tinea pedis: Secondary | ICD-10-CM

## 2022-12-30 NOTE — Telephone Encounter (Signed)
If tolerated , will continue namenda

## 2022-12-30 NOTE — Progress Notes (Signed)
   Follow-Up Visit   Subjective  Wendy Serrano is a 77 y.o. female who presents for the following: Hand Dermatitis/ Tinea Pedis  Patient was seen on 11/25/22 for a follow up for hand dermatitis and tinea pedis. There were no active lesions on hands at the time of visit. She was instructed to discontinue clotrimazole betamethasone cream and start ketoconazole cream on the feet. Griseofluvin 250mg  was given to take orally. Paient was seen at the podiatrist on 12/15/22. Dr. Charlsie Merles instructed patient to discontinue Griseofluvin and start Terbinafine 250mg  . She states the hands and feet are better but not completely healed.    The following portions of the chart were reviewed this encounter and updated as appropriate: medications, allergies, medical history  Review of Systems:  No other skin or systemic complaints except as noted in HPI or Assessment and Plan.  Objective  Well appearing patient in no apparent distress; mood and affect are within normal limits.  A focused examination was performed of the following areas: BL hands, feet  Relevant exam findings are noted in the Assessment and Plan.    Assessment & Plan   1. Hand Dermatitis - Assessment: Still improved from initial visit but small area on base of palm starting to flare. - Plan: Continue betamethasone cream twice a day for two weeks, then switch to tacrolimus ointment twice a day for another two weeks. Alternate between betamethasone and tacrolimus until scaliness resolves. Advise patient to avoid washing dishes or cleaning without gloves.  2. Tinea Pedis (Foot Fungus) - Assessment: No change from prior visit.. - Plan: Continue terbinafine (Lamisil) as prescribed by the podiatrist for a total of one month. Apply ketoconazole cream twice a day for the feet. Follow up with the podiatrist regarding terbinafine treatment and liver function tests.  I explained to pt and her husband that foot eczema and tinea pedis (foot fungus) can  look the same clinically.  A punch bx was deferred bc she c/o symptoms of neuropathy and wound healing being delayed on the lower extremities isnt' worth the risk of infection.  If the scaling doesn't clear with the oral terbinafine and topical ketoconazole, it's safe to assume an underlying eczematous dermatitis is the cause.   3. Hyperpigmentation (Dark Spots on thigh from prior rash) - Assessment: Not provided. - Plan: Continue using hydroquinone at night for three months. Use Mela B3 Lightening Serum by Michaelle Birks as an adjunct treatment. Bring all creams to the next visit for evaluation.  4. Vascular Issue (Spots on Hands) - Assessment: Not provided. - Plan: Wear compression stockings and keep legs elevated. Consider consultation with a vein specialist if necessary.  5. Leg Cramps and Numbness in Feet - Assessment: Not provided. - Plan: Consult a neurologist for nerve pain evaluation. Consult a family doctor for further evaluation of leg cramps.  6. Follow-Up - Plan: Schedule a follow-up appointment in one month to assess the progress of treatments and reevaluate the need for further interventions.    No follow-ups on file.  I, Germaine Pomfret, CMA, am acting as scribe for [employer], DO.   Documentation: I have reviewed the above documentation for accuracy and completeness, and I agree with the above.  [name], DO

## 2022-12-30 NOTE — Patient Instructions (Signed)
Hello Elenie,  Thank you for visiting my office today. Your dedication to addressing your dermatological health is greatly appreciated. Below is a summary of the essential instructions and recommendations from our consultation today:  - Dark Spot on Thigh:   - Hydroquinone Cream (this is a prescription you should have at home): Continue applying this cream at night to the dark spots on your skin for a total of three months as prescribed.   - Mela B3 Lightening Serum (samples given): Utilize the La Roche-Posay samples provided for additional treatment of dark spots.    Hand Dermatitis Treatment:  -Apply betamethasone cream twice daily for two weeks, followed by tacrolimus ointment twice daily for another two weeks. Alternate these treatments until the scaliness resolves.  Foot Dermatitis (Currently treating for Fungal infection)   - Ketoconazole Cream: Continue its application twice daily on your feet for the next month.   - Urea Cream: Use as directed to assist with flaking.   - Terbinafine: Continue with the dosage as previously prescribed by your podiatrist. Remember to monitor liver enzymes as advised. Verify the duration of use on the medication bottle and consult your podiatrist for any clarifications.  - Lifestyle Adjustments:   - Hand Dermatitis Precautions: Avoid dishwashing or cleaning without gloves to mitigate hand dermatitis.   - Vascular Issues Management: Consider the use of compression stockings and keeping your legs elevated for the noted vascular issues on your hands.   - Specialist Consultations: Seek a neurologist for the cramping pain on your shin and a vein specialist for vascular concerns as necessary.    Ensure to bring all your creams to our next appointment for a comprehensive review. Should you have any questions or concerns before our next meeting, please feel free to reach out to the office.  Warm regards,  Dr. [name],  Dermatologist

## 2022-12-31 ENCOUNTER — Encounter: Payer: Self-pay | Admitting: Family Medicine

## 2022-12-31 ENCOUNTER — Ambulatory Visit: Payer: Medicare PPO | Admitting: Family Medicine

## 2022-12-31 ENCOUNTER — Ambulatory Visit (INDEPENDENT_AMBULATORY_CARE_PROVIDER_SITE_OTHER): Payer: Medicare PPO

## 2022-12-31 VITALS — BP 110/68 | HR 71 | Temp 98.2°F | Ht 67.0 in | Wt 135.8 lb

## 2022-12-31 DIAGNOSIS — R140 Abdominal distension (gaseous): Secondary | ICD-10-CM | POA: Diagnosis not present

## 2022-12-31 DIAGNOSIS — R1030 Lower abdominal pain, unspecified: Secondary | ICD-10-CM

## 2022-12-31 LAB — POC URINALSYSI DIPSTICK (AUTOMATED)
Bilirubin, UA: NEGATIVE
Blood, UA: NEGATIVE
Glucose, UA: NEGATIVE
Ketones, UA: NEGATIVE
Leukocytes, UA: NEGATIVE
Nitrite, UA: NEGATIVE
Protein, UA: NEGATIVE
Spec Grav, UA: 1.02 (ref 1.010–1.025)
Urobilinogen, UA: 0.2 E.U./dL
pH, UA: 6.5 (ref 5.0–8.0)

## 2022-12-31 NOTE — Progress Notes (Addendum)
Subjective:     Patient ID: Wendy Serrano, female    DOB: 1945-07-14, 77 y.o.   MRN: [id number]  Chief Complaint  Patient presents with   Abdominal Pain    Pt states she's been having some discomfort at her lower abdominal area that started last night. Pt states its not tender to the touch, she can't describe the pain, its not sharpe or achy. No nausea, bowels movements have been fine    Abdominal Pain Pertinent negatives include no constipation, diarrhea, dysuria, fever, frequency, nausea, vomiting or weight loss.    Discussed the use of AI scribe software for clinical note transcription with the patient, who gave verbal consent to proceed.  History of Present Illness         C/o a dull bilateral lower abdominal pain that started last night. States the pain is not bad, just uncomfortable. No fever, chills, N/V/D.   Denies urinary symptoms. Unknown last bowel movement.   Appetite normal.     There are no preventive care reminders to display for this patient.  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   Menopausal symptoms    Renal cyst    bilateral   Rotator cuff tear    Yeast infection     Past Surgical History:  Procedure Laterality Date   ROTATOR CUFF REPAIR Left 08/2009   Left shoulder-rotator cuff repair [name])   WISDOM TOOTH EXTRACTION      Family History  Problem Relation Age of Onset   Prostate cancer Father        died   Pancreatic cancer Mother        died   Diabetes Mother    Hyperlipidemia Sister     Social History    Socioeconomic History   Marital status: Married    Spouse name: [name]   Number of children: 0   Years of education: college   Highest education level: Master's degree (e.g., MA, MS, MEng, MEd, MSW, MBA)  Occupational History   Occupation: Retired  Tobacco Use   Smoking status: Never   Smokeless tobacco: Never  Vaping Use   Vaping status: Never Used  Substance and Sexual Activity   Alcohol use: No   Drug use: No   Sexual activity: Never    Birth control/protection: Post-menopausal  Other Topics Concern   Not on file  Social History Narrative   [employer] BA; MEd-teaching;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 They are doing well   Marriage is in good health and retirement is goodEnd-of-life: provided packet of information and forms for consideration (Oct'11)      Lives with husband   Right handed   Caffeine: zero   Social Determinants of Health   Financial Resource Strain: Low Risk  (12/19/2022)   Overall Financial Resource Strain (CARDIA)    Difficulty of Paying Living Expenses: Not hard at all  Food  Insecurity: No Food Insecurity (12/19/2022)   Hunger Vital Sign    Worried About Running Out of Food in the Last Year: Never true    Ran Out of Food in the Last Year: Never true  Transportation Needs: No Transportation Needs (12/19/2022)   PRAPARE - Administrator, Civil Service (Medical): No    Lack of Transportation (Non-Medical): No  Physical Activity: Sufficiently Active (12/19/2022)   Exercise Vital Sign    Days of Exercise per Week: 3 days    Minutes of Exercise per Session: 50 min  Stress: No Stress Concern Present (12/19/2022)   [employer] of Occupational Health - Occupational Stress Questionnaire    Feeling of Stress : Not at all  Social Connections: Socially Integrated (12/19/2022)   Social Connection and Isolation Panel [NHANES]    Frequency of  Communication with Friends and Family: More than three times a week    Frequency of Social Gatherings with Friends and Family: Never    Attends Religious Services: 1 to 4 times per year    Active Member of [employer] or Organizations: Yes    Attends Banker Meetings: 1 to 4 times per year    Marital Status: Married  Catering manager Violence: Not At Risk (12/19/2022)   Humiliation, Afraid, Rape, and Kick questionnaire    Fear of Current or Ex-Partner: No    Emotionally Abused: No    Physically Abused: No    Sexually Abused: No    Outpatient Medications Prior to Visit  Medication Sig Dispense Refill   amantadine (SYMMETREL) 100 MG capsule Take 100 mg by mouth 2 (two) times daily.     Apoaequorin (PREVAGEN PO) Take 1 capsule by mouth daily.     aspirin 81 MG chewable tablet Chew 81 mg by mouth daily.     b complex vitamins tablet Take 1 tablet by mouth daily. 100 tablet 3   betamethasone dipropionate 0.05 % cream Apply topically 2 (two) times daily. 45 g 0   calcium-vitamin D (OSCAL WITH D) 500-200 MG-UNIT per tablet Take 1 tablet by mouth 2 (two) times daily.     clotrimazole-betamethasone (LOTRISONE) cream APPLY TO AFFECTED AREA TWICE A DAY 60 g 0   FOLIC ACID PO Take 4 tablets by mouth daily.     glucosamine-chondroitin 500-400 MG tablet Take 1 tablet by mouth 2 (two) times daily.     griseofulvin (GRIS-PEG) 250 MG tablet Take 1 tablet (250 mg total) by mouth daily. 30 tablet 0   ketoconazole (NIZORAL) 2 % cream Apply 1 Application topically 2 (two) times daily. 15 g 0   Magnesium 400 MG TABS Take 1 tablet by mouth daily.     memantine (NAMENDA) 10 MG tablet TAKE 1 TABLET BY MOUTH TWICE A DAY 180 tablet 1   methotrexate (RHEUMATREX) 2.5 MG tablet 6 tablets on Saturday  3   Multiple Vitamins-Minerals (OCUVITE ADULT 50+) CAPS Take 1 capsule by mouth daily.     prednisoLONE acetate (PRED FORTE) 1 % ophthalmic suspension Place 1 drop into both eyes daily.     pregabalin (LYRICA) 25  MG capsule Take 1 capsule (25 mg total) by mouth 2 (two) times daily. 60 capsule 5   rivastigmine (EXELON) 4.6 mg/24hr Place 1 patch (4.6 mg total) onto the skin daily. 90 patch 12   SIMBRINZA 1-0.2 % SUSP Place 1 drop into both eyes 2 (two) times daily.  6   tacrolimus (PROTOPIC) 0.1 % ointment Apply topically 2 (two) times daily.  Apply 2 x daily to palms for 2 weeks then stop. Alternate with Clotrimazole/Betamethasone cream 100 g 2   terbinafine (LAMISIL) 250 MG tablet Take 1 tablet (250 mg total) by mouth daily. 30 tablet 0   UNABLE TO FIND Take 2 capsules by mouth daily. Med Name: Lions mane     UNABLE TO FIND Take 2 capsules by mouth daily. Med Name: Kristen Cardinal- for cholesterol     No facility-administered medications prior to visit.    No Known Allergies  Review of Systems  Constitutional:  Negative for chills, fever, malaise/fatigue and weight loss.  Respiratory:  Negative for shortness of breath.   Cardiovascular:  Negative for chest pain and palpitations.  Gastrointestinal:  Positive for abdominal pain. Negative for constipation, diarrhea, nausea and vomiting.  Genitourinary:  Negative for dysuria, frequency and urgency.  Skin:  Negative for rash.  Neurological:  Negative for dizziness and focal weakness.       Objective:    Physical Exam Constitutional:      General: She is not in acute distress.    Appearance: She is not ill-appearing.  Eyes:     Extraocular Movements: Extraocular movements intact.     Conjunctiva/sclera: Conjunctivae normal.  Cardiovascular:     Rate and Rhythm: Normal rate.  Pulmonary:     Effort: Pulmonary effort is normal.  Abdominal:     General: Bowel sounds are normal. There is no distension.     Palpations: Abdomen is soft.     Tenderness: There is no abdominal tenderness. There is no right CVA tenderness, left CVA tenderness, guarding or rebound. Negative signs include Murphy's sign, McBurney's sign and psoas sign.  Musculoskeletal:      Cervical back: Normal range of motion and neck supple.  Skin:    General: Skin is warm and dry.  Neurological:     General: No focal deficit present.     Mental Status: She is alert and oriented to person, place, and time.  Psychiatric:        Mood and Affect: Mood normal.        Behavior: Behavior normal.        Thought Content: Thought content normal.      BP 110/68 (BP Location: Left Arm, Patient Position: Sitting, Cuff Size: Normal)   Pulse 71   Temp 98.2 F (36.8 C) (Oral)   Ht 5\' 7"  (1.702 m)   Wt 135 lb 12.8 oz (61.6 kg)   SpO2 98%   BMI 21.27 kg/m  Wt Readings from Last 3 Encounters:  12/31/22 135 lb 12.8 oz (61.6 kg)  12/19/22 136 lb (61.7 kg)  11/26/22 136 lb 12.8 oz (62.1 kg)       Assessment & Plan:   Problem List Items Addressed This Visit   None Visit Diagnoses     Lower abdominal pain    -  Primary   Relevant Orders   POCT Urinalysis Dipstick (Automated) (Completed)   DG Abd 2 Views      UA negative.  Exam is benign.  Abdominal XR ordered to r/o obstruction, constipation.  Cognitive impairment and unknown last bowel movement.  Follow up for any worsening or new symptoms. She has follow up with PCP next week.    I am having Glenice Bow maintain her calcium-vitamin D, glucosamine-chondroitin, Simbrinza, methotrexate, Ocuvite Adult 50+, prednisoLONE acetate, b complex vitamins, aspirin, amantadine, Magnesium, Apoaequorin (PREVAGEN PO), FOLIC ACID PO, UNABLE TO FIND, UNABLE TO FIND, betamethasone dipropionate, tacrolimus, pregabalin, clotrimazole-betamethasone, ketoconazole, griseofulvin, rivastigmine,  terbinafine, and memantine.  No orders of the defined types were placed in this encounter.

## 2022-12-31 NOTE — Patient Instructions (Addendum)
Your urine is normal. No urinary tract infection.   Please go downstairs for an X ray of your abdomen.   Keep an eye on your symptoms and let us know if you have any new or worsening symptoms.

## 2023-01-07 ENCOUNTER — Encounter: Payer: Self-pay | Admitting: Internal Medicine

## 2023-01-07 ENCOUNTER — Ambulatory Visit: Payer: Medicare PPO | Admitting: Internal Medicine

## 2023-01-07 VITALS — BP 104/66 | HR 79 | Temp 99.3°F | Ht 67.0 in | Wt 130.0 lb

## 2023-01-07 DIAGNOSIS — R413 Other amnesia: Secondary | ICD-10-CM | POA: Diagnosis not present

## 2023-01-07 DIAGNOSIS — R1084 Generalized abdominal pain: Secondary | ICD-10-CM

## 2023-01-07 DIAGNOSIS — R252 Cramp and spasm: Secondary | ICD-10-CM

## 2023-01-07 LAB — CK: Total CK: 216 U/L — ABNORMAL HIGH (ref 7–177)

## 2023-01-07 LAB — CBC WITH DIFFERENTIAL/PLATELET
Basophils Absolute: 0 10*3/uL (ref 0.0–0.1)
Basophils Relative: 0.6 % (ref 0.0–3.0)
Eosinophils Absolute: 0 10*3/uL (ref 0.0–0.7)
Eosinophils Relative: 0.3 % (ref 0.0–5.0)
HCT: 40.1 % (ref 36.0–46.0)
Hemoglobin: 13.2 g/dL (ref 12.0–15.0)
Lymphocytes Relative: 8 % — ABNORMAL LOW (ref 12.0–46.0)
Lymphs Abs: 0.5 10*3/uL — ABNORMAL LOW (ref 0.7–4.0)
MCHC: 32.9 g/dL (ref 30.0–36.0)
MCV: 95.8 fl (ref 78.0–100.0)
Monocytes Absolute: 0.6 10*3/uL (ref 0.1–1.0)
Monocytes Relative: 9.5 % (ref 3.0–12.0)
Neutro Abs: 5.1 10*3/uL (ref 1.4–7.7)
Neutrophils Relative %: 81.6 % — ABNORMAL HIGH (ref 43.0–77.0)
Platelets: 265 10*3/uL (ref 150.0–400.0)
RBC: 4.19 Mil/uL (ref 3.87–5.11)
RDW: 13.4 % (ref 11.5–15.5)
WBC: 6.2 10*3/uL (ref 4.0–10.5)

## 2023-01-07 LAB — URINALYSIS
Bilirubin Urine: NEGATIVE
Hgb urine dipstick: NEGATIVE
Ketones, ur: NEGATIVE
Leukocytes,Ua: NEGATIVE
Nitrite: NEGATIVE
Specific Gravity, Urine: 1.025 (ref 1.000–1.030)
Urine Glucose: NEGATIVE
Urobilinogen, UA: 1 (ref 0.0–1.0)
pH: 7 (ref 5.0–8.0)

## 2023-01-07 LAB — MAGNESIUM: Magnesium: 2 mg/dL (ref 1.5–2.5)

## 2023-01-07 LAB — COMPREHENSIVE METABOLIC PANEL
ALT: 26 U/L (ref 0–35)
AST: 31 U/L (ref 0–37)
Albumin: 4.1 g/dL (ref 3.5–5.2)
Alkaline Phosphatase: 88 U/L (ref 39–117)
BUN: 16 mg/dL (ref 6–23)
CO2: 30 meq/L (ref 19–32)
Calcium: 9.6 mg/dL (ref 8.4–10.5)
Chloride: 105 meq/L (ref 96–112)
Creatinine, Ser: 0.81 mg/dL (ref 0.40–1.20)
GFR: 69.55 mL/min (ref >60.00)
Glucose, Bld: 91 mg/dL (ref 70–99)
Potassium: 4 meq/L (ref 3.5–5.1)
Sodium: 141 meq/L (ref 135–145)
Total Bilirubin: 0.6 mg/dL (ref 0.2–1.2)
Total Protein: 7.2 g/dL (ref 6.0–8.3)

## 2023-01-07 LAB — SEDIMENTATION RATE: Sed Rate: 26 mm/h (ref 0–30)

## 2023-01-07 MED ORDER — MEMANTINE HCL 10 MG PO TABS: 10.0000 mg | ORAL_TABLET | Freq: Two times a day (BID) | ORAL | Status: DC

## 2023-01-07 NOTE — Patient Instructions (Addendum)
Dr. Vickey Huger confirmed that she does want to stop the Aricept (Donepezil) and add there Exelon (Rivastigmine) patch. She is to continue taking the Namenda (Memantine) 10 mg tablet twice a day.   The patch is a daily patch to be worn for 24 hours and then replaced with a new patch. Be mindful to rotate sites for the patch and not to place in the area within 14 days of a previous placement.     Blue-Emu cream on calves at bedtime

## 2023-01-07 NOTE — Assessment & Plan Note (Signed)
Dr. Vickey Huger confirmed that she does want to stop the Aricept (Donepezil) and add there Exelon (Rivastigmine) patch. She is to continue taking the Namenda (Memantine) 10 mg tablet twice a day.

## 2023-01-07 NOTE — Assessment & Plan Note (Addendum)
New x 5 d Treat constipation Miralax 1 scoop bid, Senakot one twice a day Check CBC, ESR CT if not better

## 2023-01-07 NOTE — Assessment & Plan Note (Signed)
Use sneakers in the house - 4 floors, taking stairs Use Mag oil spray, Mag po

## 2023-01-07 NOTE — Progress Notes (Signed)
Subjective:  Patient ID: Wendy Serrano, female    DOB: [birth year]  Age: 77 y.o. MRN: [id number]  CC: Follow-up (Lower abdominal pain f/u... Pt has stated the Gas-X does give her some relief)   HPI Reeda Hollers presents for pain in the abdomen in the middle since last Friday Constipated  On Exelon patch x 3 weeks  C/o cramps x 1 year  Outpatient Medications Prior to Visit  Medication Sig Dispense Refill   amantadine (SYMMETREL) 100 MG capsule Take 100 mg by mouth 2 (two) times daily.     Apoaequorin (PREVAGEN PO) Take 1 capsule by mouth daily.     aspirin 81 MG chewable tablet Chew 81 mg by mouth daily.     b complex vitamins tablet Take 1 tablet by mouth daily. 100 tablet 3   betamethasone dipropionate 0.05 % cream Apply topically 2 (two) times daily. 45 g 0   calcium-vitamin D (OSCAL WITH D) 500-200 MG-UNIT per tablet Take 1 tablet by mouth 2 (two) times daily.     clotrimazole-betamethasone (LOTRISONE) cream APPLY TO AFFECTED AREA TWICE A DAY 60 g 0   FOLIC ACID PO Take 4 tablets by mouth daily.     glucosamine-chondroitin 500-400 MG tablet Take 1 tablet by mouth 2 (two) times daily.     griseofulvin (GRIS-PEG) 250 MG tablet Take 1 tablet (250 mg total) by mouth daily. 30 tablet 0   Magnesium 400 MG TABS Take 1 tablet by mouth daily.     methotrexate (RHEUMATREX) 2.5 MG tablet 6 tablets on Saturday  3   Multiple Vitamins-Minerals (OCUVITE ADULT 50+) CAPS Take 1 capsule by mouth daily.     prednisoLONE acetate (PRED FORTE) 1 % ophthalmic suspension Place 1 drop into both eyes daily.     pregabalin (LYRICA) 25 MG capsule Take 1 capsule (25 mg total) by mouth 2 (two) times daily. 60 capsule 5   rivastigmine (EXELON) 4.6 mg/24hr Place 1 patch (4.6 mg total) onto the skin daily. 90 patch 12   SIMBRINZA 1-0.2 % SUSP Place 1 drop into both eyes 2 (two) times daily.  6   tacrolimus (PROTOPIC) 0.1 % ointment Apply topically 2 (two) times daily. Apply 2 x daily to palms for 2 weeks  then stop. Alternate with Clotrimazole/Betamethasone cream 100 g 2   terbinafine (LAMISIL) 250 MG tablet Take 1 tablet (250 mg total) by mouth daily. 30 tablet 0   UNABLE TO FIND Take 2 capsules by mouth daily. Med Name: Lions mane     UNABLE TO FIND Take 2 capsules by mouth daily. Med Name: Kyolic- for cholesterol     memantine (NAMENDA) 10 MG tablet TAKE 1 TABLET BY MOUTH TWICE A DAY 180 tablet 1   No facility-administered medications prior to visit.    ROS: Review of Systems  Objective:  BP 104/66 (BP Location: Left Arm, Patient Position: Sitting, Cuff Size: Large)   Pulse 79   Temp 99.3 F (37.4 C) (Oral)   Ht 5\' 7"  (1.702 m)   Wt 130 lb (59 kg)   SpO2 99%   BMI 20.36 kg/m   BP Readings from Last 3 Encounters:  01/07/23 104/66  12/31/22 110/68  11/26/22 138/84    Wt Readings from Last 3 Encounters:  01/07/23 130 lb (59 kg)  12/31/22 135 lb 12.8 oz (61.6 kg)  12/19/22 136 lb (61.7 kg)    Physical Exam  Lab Results  Component Value Date   WBC 3.6 (L) 03/06/2022   HGB 13.4 03/06/2022  HCT 40.2 03/06/2022   PLT 254.0 03/06/2022   GLUCOSE 83 03/06/2022   CHOL 208 (H) 08/22/2020   TRIG 51.0 08/22/2020   HDL 86.40 08/22/2020   LDLDIRECT 127.9 03/09/2013   LDLCALC 112 (H) 08/22/2020   ALT 25 03/06/2022   AST 32 03/06/2022   NA 141 03/06/2022   K 3.8 03/06/2022   CL 105 03/06/2022   CREATININE 0.88 03/06/2022   BUN 22 03/06/2022   CO2 29 03/06/2022   TSH 9.42 (H) 03/06/2022   INR 1.1 (H) 05/16/2015   HGBA1C 5.4 02/26/2011    VAS [place] ABI WITH/WO TBI  Result Date: 02/17/2022  LOWER EXTREMITY DOPPLER STUDY Patient Name:  Wendy Serrano  Date of Exam:   02/17/2022 Medical Rec #: [id number]        Accession #:    [id number] Date of Birth: [birth year]        Patient Gender: F Patient Age:   70 years Exam Location:  [facility] Procedure:      VAS [place] ABI WITH/WO TBI Referring Phys:  --------------------------------------------------------------------------------  Indications: Patient reports right leg doesn't feel normal. Episodes of numbness              to the bottom of the foot. Primary reports poor pulsese on exam. High Risk Factors: None.  Performing Technologist: Dorthula Matas RVS, RCS  Examination Guidelines: A complete evaluation includes at minimum, Doppler waveform signals and systolic blood pressure reading at the level of bilateral brachial, anterior tibial, and posterior tibial arteries, when vessel segments are accessible. Bilateral testing is considered an integral part of a complete examination. Photoelectric Plethysmograph (PPG) waveforms and toe systolic pressure readings are included as required and additional duplex testing as needed. Limited examinations for reoccurring indications may be performed as noted.  ABI Findings: +---------+------------------+-----+---------+--------+ Right    Rt Pressure (mmHg)IndexWaveform Comment  +---------+------------------+-----+---------+--------+ Brachial 125                                      +---------+------------------+-----+---------+--------+ PTA      133               1.06 triphasic         +---------+------------------+-----+---------+--------+ DP       151               1.21 triphasic         +---------+------------------+-----+---------+--------+ Great Toe99                0.79                   +---------+------------------+-----+---------+--------+ +---------+------------------+-----+---------+-------+ Left     Lt Pressure (mmHg)IndexWaveform Comment +---------+------------------+-----+---------+-------+ Brachial 122                                     +---------+------------------+-----+---------+-------+ PTA      153               1.22 triphasic        +---------+------------------+-----+---------+-------+ DP       158               1.26 triphasic         +---------+------------------+-----+---------+-------+ Great Toe103               0.82                  +---------+------------------+-----+---------+-------+ +-------+-----------+-----------+------------+------------+  ABI/TBIToday's ABIToday's TBIPrevious ABIPrevious TBI +-------+-----------+-----------+------------+------------+ Right  1.21       0.79                                +-------+-----------+-----------+------------+------------+ Left   1.26       0.82                                +-------+-----------+-----------+------------+------------+   Summary: Right: Resting right ankle-brachial index is within normal range. The right toe-brachial index is normal. Left: Resting left ankle-brachial index is within normal range. The left toe-brachial index is normal. *See table(s) above for measurements and observations.  Electronically signed by Coral Else MD on 02/17/2022 at 3:18:03 PM.    Final     Assessment & Plan:   Problem List Items Addressed This Visit     Abdominal pain    New x 5 d Treat constipation Miralax 1 scoop bid, Senakot one twice a day Check CBC, ESR CT if not better       Relevant Orders   Comprehensive metabolic panel   Sedimentation rate   Urinalysis   CK   Magnesium   CBC with Differential/Platelet   T4, free   TSH   Cramps, extremity    Use sneakers in the house - 4 floors, taking stairs Use Mag oil spray, Mag po      Relevant Orders   CK   CBC with Differential/Platelet   T4, free   TSH   Memory difficulties - Primary    Dr. Vickey Huger confirmed that she does want to stop the Aricept (Donepezil) and add there Exelon (Rivastigmine) patch. She is to continue taking the Namenda (Memantine) 10 mg tablet twice a day.           Relevant Orders   CBC with Differential/Platelet   T4, free   TSH      Meds ordered this encounter  Medications   memantine (NAMENDA) 10 MG tablet    Sig: Take 1 tablet (10 mg total) by mouth 2  (two) times daily.      Follow-up: Return in about 6 weeks (around 02/18/2023) for a follow-up visit.  Sonda Primes, MD

## 2023-01-08 ENCOUNTER — Telehealth (INDEPENDENT_AMBULATORY_CARE_PROVIDER_SITE_OTHER): Payer: Medicare PPO | Admitting: Family Medicine

## 2023-01-08 VITALS — Temp 98.7°F

## 2023-01-08 DIAGNOSIS — U071 COVID-19: Secondary | ICD-10-CM | POA: Diagnosis not present

## 2023-01-08 LAB — TSH: TSH: 3.69 u[IU]/mL (ref 0.35–5.50)

## 2023-01-08 LAB — T4, FREE: Free T4: 0.73 ng/dL (ref 0.60–1.60)

## 2023-01-08 MED ORDER — NIRMATRELVIR/RITONAVIR (PAXLOVID)TABLET: 3.0000 | ORAL_TABLET | Freq: Two times a day (BID) | ORAL | 0 refills | Status: AC

## 2023-01-08 NOTE — Patient Instructions (Signed)
HOME CARE TIPS:    -I sent the medication(s) we discussed to your pharmacy: Meds ordered this encounter  Medications   nirmatrelvir/ritonavir (PAXLOVID) 20 x 150 MG & 10 x 100MG  TABS    Sig: Take 3 tablets by mouth 2 (two) times daily for 5 days. (Take nirmatrelvir 150 mg two tablets twice daily for 5 days and ritonavir 100 mg one tablet twice daily for 5 days) Patient GFR is >60    Dispense:  30 tablet    Refill:  0     -I sent in the Covid19 treatment or referral you requested per our discussion. Please see the information provided below and discuss further with the pharmacist/treatment team.  -If taking Paxlovid, please review all medications, supplement and over the counter drugs with your pharmacist and ask them to check for any interactions.  *  -there is a chance of rebound illness with covid after improving. This can happen whether or not you take an antiviral treatment. If you become sick again with covid after getting better, please schedule a follow up virtual visit and isolate again.  -can use tylenol or aleve if needed for fevers, aches and pains per instructions  -nasal saline sinus rinses twice daily  -stay hydrated, drink plenty of fluids and eat small healthy meals - avoid dairy  -follow up with your doctor in 2-3 days unless improving and feeling better  -stay home while sick, except to seek medical care. If you have COVID19, you will likely be contagious for 7-10 days. Flu or Influenza is likely contagious for about 7 days. Other respiratory viral infections remain contagious for 5-10+ days depending on the virus and many other factors. Wear a good mask that fits snugly (such as N95 or KN95) if around others to reduce the risk of transmission.  It was nice to meet you today, and I really hope you are feeling better soon. I help [facility] out with telemedicine visits on Tuesdays and Thursdays and am happy to help if you need a follow up virtual visit on those days.  Otherwise, if you have any concerns or questions following this visit please schedule a follow up visit with your Primary Care doctor or seek care at a local urgent care clinic to avoid delays in care.    Seek in person care or schedule a follow up video visit promptly if your symptoms worsen, new concerns arise or you are not improving with treatment. Call 911 and/or seek emergency care if your symptoms are severe or life threatening.  Nirmatrelvir; Ritonavir Tablets What is this medication? NIRMATRELVIR; RITONAVIR (NIR ma TREL vir; ri TOE na veer) treats mild to moderate COVID-19. It may help people who are at high risk of developing severe illness. It works by limiting the spread of the virus in your body. This medicine may be used for other purposes; ask your health care provider or pharmacist if you have questions. COMMON BRAND NAME(S): PAXLOVID What should I tell my care team before I take this medication? They need to know if you have any of these conditions: Any allergies Any serious illness Kidney disease Liver disease An unusual or allergic reaction to nirmatrelvir, ritonavir, other medications, foods, dyes, or preservatives Pregnant or trying to get pregnant Breast-feeding How should I use this medication? This product contains 2 different medications that are packaged together. For the standard dose, take 2 pink tablets of nirmatrelvir with 1 white tablet of ritonavir (3 tablets total) by mouth with water twice daily. Talk  to your care team if you have kidney disease. You may need a different dose. Swallow the tablets whole. You can take it with or without food. If it upsets your stomach, take it with food. Take all of this medication unless your care team tells you to stop it early. Keep taking it even if you think you are better. Talk to your care team about the use of this medication in children. While it may be prescribed for children as young as 12 years for selected conditions,  precautions do apply. Overdosage: If you think you have taken too much of this medicine contact a poison control center or emergency room at once. NOTE: This medicine is only for you. Do not share this medicine with others. What if I miss a dose? If you miss a dose, take it as soon as you can unless it is more than 8 hours late. If it is more than 8 hours late, skip the missed dose. Take the next dose at the normal time. Do not take extra or 2 doses at the same time to make up for the missed dose. What may interact with this medication? Do not take this medication with any of the following: Alfuzosin Certain medications for anxiety or sleep, such as midazolam or triazolam Certain medications for cancer, such as apalutamide Certain medications for cholesterol, such as lovastatin or simvastatin Certain medications for irregular heartbeat, such as amiodarone, dronedarone, flecainide, propafenone, quinidine Certain medications for mental health conditions, such as lurasidone or pimozide Certain medications for seizures, such as carbamazepine, phenobarbital, phenytoin, primidone Colchicine Eletriptan Eplerenone Ergot alkaloids, such as dihydroergotamine, ergotamine, methylergonovine Finerenone Flibanserin Ivabradine Lomitapide Lumacaftor; ivacaftor Naloxegol Ranolazine Red Yeast Rice Rifampin Rifapentine Sildenafil Silodosin [facility]. [name]'s wort Tolvaptan Ubrogepant Voclosporin This medication may affect how other medications work, and other medications may affect the way this medication works. Talk with your care team about all of the medications you take. They may suggest changes to your treatment plan to lower the risk of side effects and to make sure your medications work as intended. This list may not describe all possible interactions. Give your health care provider a list of all the medicines, herbs, non-prescription drugs, or dietary supplements you use. Also tell them if you smoke,  drink alcohol, or use illegal drugs. Some items may interact with your medicine. What should I watch for while using this medication? Your condition will be monitored carefully while you are receiving this medication. Visit your care team for regular checkups. Tell your care team if your symptoms do not start to get better or if they get worse. If you have untreated HIV infection, this medication may lead to some HIV medications not working as well in the future. Estrogen and progestin hormones may not work as well while you are taking this medication. Your care team can help you find the contraceptive option that works for you. What side effects may I notice from receiving this medication? Side effects that you should report to your care team as soon as possible: Allergic reactions--skin rash, itching, hives, swelling of the face, lips, tongue, or throat Liver injury--right upper belly pain, loss of appetite, nausea, light-colored stool, dark yellow or brown urine, yellowing skin or eyes, unusual weakness or fatigue Redness, blistering, peeling, or loosening of the skin, including inside the mouth Side effects that usually do not require medical attention (report these to your care team if they continue or are bothersome): Change in taste Diarrhea General discomfort and fatigue  Increase in blood pressure Muscle pain Nausea Stomach pain This list may not describe all possible side effects. Call your doctor for medical advice about side effects. You may report side effects to FDA at 1-800-FDA-1088. Where should I keep my medication? Keep out of the reach of children and pets. Store at room temperature between 20 and 25 degrees C (68 and 77 degrees F). Get rid of any unused medication after the expiration date. To get rid of medications that are no longer needed or have expired: Take the medication to a medication take-back program. Check with your pharmacy or law enforcement to find a  location. If you cannot return the medication, check the label or package insert to see if the medication should be thrown out in the garbage or flushed down the toilet. If you are not sure, ask your care team. If it is safe to put it in the trash, take the medication out of the container. Mix the medication with cat litter, dirt, coffee grounds, or other unwanted substance. Seal the mixture in a bag or container. Put it in the trash. NOTE: This sheet is a summary. It may not cover all possible information. If you have questions about this medicine, talk to your doctor, pharmacist, or health care provider.  2024 Elsevier/Gold Standard (2022-06-16 00:00:00)

## 2023-01-08 NOTE — Progress Notes (Signed)
Virtual Visit via Video Note  I connected with Wendy Serrano  on 01/08/23 at  3:20 PM EDT by a video enabled telemedicine application and verified that I am speaking with the correct person using two identifiers.  Location patient: [place] Location provider:work or home office Persons participating in the virtual visit: patient, provider, Dr. Janee Morn  I discussed the limitations and requested verbal permission for telemedicine visit. The patient expressed understanding and agreed to proceed.   HPI:  Acute telemedicine visit for Covid: -Onset: last night symptoms started, tested positive today -Symptoms include: congestion, drainage in the throat -Denies: fever, CP, SOB, NVD -Pertinent past medical history: see below, has had covid once in the past, GFR was 69 -Pertinent medication allergies:No Known Allergies -COVID-19 vaccine status:  had booster in the fall Immunization History  Administered Date(s) Administered   COVID-19, mRNA, vaccine(Comirnaty)12 years and older 02/04/2022   Fluad Quad(high Dose 65+) 01/12/2019, 02/04/2022   Influenza Split 02/26/2011, 03/01/2012   Influenza Whole 02/10/2008, 02/15/2009, 02/19/2010   Influenza, High Dose Seasonal PF 03/09/2013, 03/21/2015, 04/01/2016, 03/03/2017, 01/23/2021   Influenza,inj,Quad PF,6+ Mos 05/10/2014, 02/13/2018   PFIZER(Purple Top)SARS-COV-2 Vaccination 05/25/2019, 06/15/2019, 02/04/2020, 08/11/2020   Pfizer Covid-19 Vaccine Bivalent Booster 87yrs & up 01/23/2021   Pneumococcal Conjugate-13 03/23/2013   Pneumococcal Polysaccharide-23 02/19/2010, 05/18/2013   Td 02/10/2008   Td (Adult), 2 Lf Tetanus Toxid, Preservative Free 02/10/2008   Tdap 06/14/2018   Zoster, Live 07/04/2009     ROS: See pertinent positives and negatives per HPI.  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 (Take nirmatrelvir 150 mg two tablets twice daily for 5 days and ritonavir 100 mg one tablet twice daily for 5 days) Patient GFR is >60, Disp: 30 tablet, Rfl: 0   amantadine (SYMMETREL) 100 MG capsule, Take 100 mg by mouth 2 (two) times daily., Disp: , Rfl:    Apoaequorin (PREVAGEN PO), Take 1 capsule by mouth daily., Disp: , Rfl:    aspirin 81 MG chewable tablet, Chew 81 mg by mouth daily., Disp: , Rfl:    b complex vitamins tablet, Take 1 tablet by mouth daily., Disp: 100 tablet, Rfl: 3   betamethasone dipropionate 0.05 % cream, Apply topically 2 (two) times daily., Disp: 45 g, Rfl: 0   calcium-vitamin D (OSCAL WITH D) 500-200 MG-UNIT per tablet, Take 1 tablet by mouth 2 (two) times daily., Disp: , Rfl:    clotrimazole-betamethasone (LOTRISONE) cream, APPLY TO AFFECTED AREA TWICE A DAY, Disp: 60 g,  Rfl: 0   FOLIC ACID PO, Take 4 tablets by mouth daily., Disp: , Rfl:    glucosamine-chondroitin 500-400 MG tablet, Take 1 tablet by mouth 2 (two) times daily., Disp: , Rfl:    Magnesium 400 MG TABS, Take 1 tablet by mouth daily., Disp: , Rfl:    memantine (NAMENDA) 10 MG tablet, Take 1 tablet (10 mg total) by mouth 2 (two) times daily., Disp: , Rfl:    methotrexate (RHEUMATREX) 2.5 MG tablet, 6 tablets on Saturday, Disp: , Rfl:  3   Multiple Vitamins-Minerals (OCUVITE ADULT 50+) CAPS, Take 1 capsule by mouth daily., Disp: , Rfl:    prednisoLONE acetate (PRED FORTE) 1 % ophthalmic suspension, Place 1 drop into both eyes daily., Disp: , Rfl:    pregabalin (LYRICA) 25 MG capsule, Take 1 capsule (25 mg total) by mouth 2 (two) times daily., Disp: 60 capsule, Rfl: 5   rivastigmine (EXELON) 4.6 mg/24hr, Place 1 patch (4.6 mg total) onto the skin daily., Disp: 90 patch, Rfl: 12   SIMBRINZA 1-0.2 % SUSP, Place 1 drop into both eyes 2 (two) times daily., Disp: , Rfl: 6   tacrolimus (PROTOPIC) 0.1 % ointment, Apply topically 2 (two) times daily. Apply 2 x daily to palms for 2 weeks then stop. Alternate with Clotrimazole/Betamethasone cream, Disp: 100 g, Rfl: 2   terbinafine (LAMISIL) 250 MG tablet, Take 1 tablet (250 mg total) by mouth daily., Disp: 30 tablet, Rfl: 0   UNABLE TO FIND, Take 2 capsules by mouth daily. Med Name: [place] mane, Disp: , Rfl:    UNABLE TO FIND, Take 2 capsules by mouth daily. Med Name: Kyolic- for cholesterol, Disp: , Rfl:   EXAM:  VITALS per patient if applicable:  GENERAL: alert, oriented, appears well and in no acute distress  HEENT: atraumatic, conjunttiva clear, no obvious abnormalities on inspection of external nose and ears  NECK: normal movements of the head and neck  LUNGS: on inspection no signs of respiratory distress, breathing rate appears normal, no obvious gross SOB, gasping or wheezing  CV: no obvious cyanosis  MS: moves all visible extremities without noticeable abnormality  PSYCH/NEURO: pleasant and cooperative, no obvious depression or anxiety, speech and thought processing grossly intact  ASSESSMENT AND PLAN:  Discussed the following assessment and plan:  COVID-19   Discussed treatment options, side effect and risk of drug interactions, ideal treatment window, potential complications, isolation and precautions for COVID-19.  Checked for/reviewed last GFR - listed in HPI if  available.  After lengthy discussion, the patient opted for treatment with Paxlovid due to being higher risk for complications of covid or severe disease and other factors. Discussed side effects, alternatives, interactions if applicable. She agrees to hold supplements and vitamins while on paxlovid.  Advised to seek prompt virtual visit or in person care if worsening, new symptoms arise, or if is not improving with treatment as expected per our conversation of expected course. Discussed options for follow up care. Did let this patient know that I do telemedicine on Tuesdays and Thursdays for [place] and those are the days I am logged into the system. Advised to schedule follow up visit with PCP, [facility] virtual visits or [facility] if any further questions or concerns to avoid delays in care.   I discussed the assessment and treatment plan with the patient. The patient was provided an opportunity to ask questions and all were answered. The patient agreed with the plan and demonstrated an understanding of the instructions.  Terressa Koyanagi, DO

## 2023-01-14 ENCOUNTER — Other Ambulatory Visit: Payer: Self-pay | Admitting: Internal Medicine

## 2023-01-14 DIAGNOSIS — H40013 Open angle with borderline findings, low risk, bilateral: Secondary | ICD-10-CM | POA: Diagnosis not present

## 2023-01-18 ENCOUNTER — Encounter: Payer: Self-pay | Admitting: Dermatology

## 2023-01-20 ENCOUNTER — Other Ambulatory Visit: Payer: Self-pay | Admitting: Dermatology

## 2023-01-23 ENCOUNTER — Telehealth: Payer: Self-pay | Admitting: Diagnostic Neuroimaging

## 2023-01-23 NOTE — Telephone Encounter (Signed)
Patient's husband called due to patient keeping one of her rivastigmine patches on for 48 hours (ended up with 2 patches for 24 hours).  Patient is doing well and no side effects or symptoms.  Advised to remove redundant patches, and apply a new 1 as scheduled today.  Suanne Marker, MD 01/23/2023, 1:11 PM Certified in Neurology, Neurophysiology and Neuroimaging  [facility] [address] [place], [place] [address] [phone]

## 2023-03-13 DIAGNOSIS — H31092 Other chorioretinal scars, left eye: Secondary | ICD-10-CM | POA: Diagnosis not present

## 2023-03-13 DIAGNOSIS — H209 Unspecified iridocyclitis: Secondary | ICD-10-CM | POA: Diagnosis not present

## 2023-03-13 DIAGNOSIS — F0390 Unspecified dementia without behavioral disturbance: Secondary | ICD-10-CM | POA: Diagnosis not present

## 2023-03-13 DIAGNOSIS — H35372 Puckering of macula, left eye: Secondary | ICD-10-CM | POA: Diagnosis not present

## 2023-03-13 DIAGNOSIS — H35363 Drusen (degenerative) of macula, bilateral: Secondary | ICD-10-CM | POA: Diagnosis not present

## 2023-03-13 DIAGNOSIS — H35351 Cystoid macular degeneration, right eye: Secondary | ICD-10-CM | POA: Diagnosis not present

## 2023-03-13 DIAGNOSIS — Z79899 Other long term (current) drug therapy: Secondary | ICD-10-CM | POA: Diagnosis not present

## 2023-03-13 DIAGNOSIS — H40013 Open angle with borderline findings, low risk, bilateral: Secondary | ICD-10-CM | POA: Diagnosis not present

## 2023-03-13 DIAGNOSIS — H40043 Steroid responder, bilateral: Secondary | ICD-10-CM | POA: Diagnosis not present

## 2023-03-13 DIAGNOSIS — Z961 Presence of intraocular lens: Secondary | ICD-10-CM | POA: Diagnosis not present

## 2023-03-24 ENCOUNTER — Encounter: Payer: Self-pay | Admitting: Neurology

## 2023-03-24 ENCOUNTER — Ambulatory Visit: Payer: Medicare PPO | Admitting: Neurology

## 2023-03-24 VITALS — BP 138/80 | HR 85 | Ht 68.0 in | Wt 136.0 lb

## 2023-03-24 DIAGNOSIS — F0280 Dementia in other diseases classified elsewhere without behavioral disturbance: Secondary | ICD-10-CM

## 2023-03-24 DIAGNOSIS — G301 Alzheimer's disease with late onset: Secondary | ICD-10-CM

## 2023-03-24 DIAGNOSIS — G309 Alzheimer's disease, unspecified: Secondary | ICD-10-CM

## 2023-03-24 MED ORDER — RIVASTIGMINE 4.6 MG/24HR TD PT24: 4.6000 mg | MEDICATED_PATCH | Freq: Every day | TRANSDERMAL | 3 refills | Status: DC

## 2023-03-24 NOTE — Patient Instructions (Addendum)
There are well-accepted and sensible ways to reduce risk for Alzheimers disease and other degenerative brain disorders .  Exercise Daily Walk A daily 20 minute walk should be part of your routine. Disease related apathy can be a significant roadblock to exercise and the only way to overcome this is to make it a daily routine and perhaps have a reward at the end (something your loved one loves to eat or drink perhaps) or a personal trainer coming to the home can also be very useful. Most importantly, the patient is much more likely to exercise if the caregiver / spouse does it with him/her. In general a structured, repetitive schedule is best.  General Health: Any diseases which effect your body will effect your brain such as a pneumonia, urinary infection, blood clot, heart attack or stroke. Keep contact with your primary care doctor for regular follow ups.  Sleep. A good nights sleep is healthy for the brain. Seven hours is recommended. If you have insomnia or poor sleep habits we can give you some instructions. If you have sleep apnea wear your mask.  Diet: Eating a heart healthy diet is also a good idea; fish and poultry instead of red meat, nuts (mostly non-peanuts), vegetables, fruits, olive oil or canola oil (instead of butter), minimal salt (use other spices to flavor foods), whole grain rice, bread, cereal and pasta and wine in moderation.Research is now showing that the MIND diet, which is a combination of The Mediterranean diet and the DASH diet, is beneficial for cognitive processing and longevity. Information about this diet can be found in The MIND Diet, a book by Alonna Minium, MS, RDN, and online at WildWildScience.es  Finances, Power of [address] and Advance Directives: You should consider putting legal safeguards in place with regard to financial and medical decision making. While the spouse always has power of attorney for medical and financial issues in the  absence of any form, you should consider what you want in case the spouse / caregiver is no longer around or capable of making decisions.   The Alzheimers Association Position on Disease Prevention  Can Alzheimer's be prevented? It's a question that continues to intrigue researchers and fuel new investigations. There are no clear-cut answers yet -- partially due to the need for more large-scale studies in diverse populations -- but promising research is under way. The Alzheimer's Association is leading the worldwide effort to find a treatment for Alzheimer's, delay its onset and prevent it from developing.   What causes Alzheimer's? Experts agree that in the vast majority of cases, Alzheimer's, like other common chronic conditions, probably develops as a result of complex interactions among multiple factors, including age, genetics, environment, lifestyle and coexisting medical conditions. Although some risk factors -- such as age or genes -- cannot be changed, other risk factors -- such as high blood pressure and lack of exercise -- usually can be changed to help reduce risk. Research in these areas may lead to new ways to detect those at highest risk.  Prevention studies A small percentage of people with Alzheimer's disease (less than 1 percent) have an early-onset type associated with genetic mutations. Individuals who have these genetic mutations are guaranteed to develop the disease. An ongoing clinical trial conducted by the [facility] ([facility]), is testing whether antibodies to beta-amyloid can reduce the accumulation of beta-amyloid plaque in the brains of people with such genetic mutations and thereby reduce, delay or prevent symptoms. Participants in the trial are receiving antibodies (  or placebo) before they develop symptoms, and the development of beta-amyloid plaques is being monitored by brain scans and other tests.  Another clinical trial, known as the A4 trial  (Anti-Amyloid Treatment in Asymptomatic Alzheimer's), is testing whether antibodies to beta-amyloid can reduce the risk of Alzheimer's disease in older people (ages 42 to 37) at high risk for the disease. The A4 trial is being conducted by the Alzheimer's Disease Co[facility] Study.  Though research is still evolving, evidence is strong that people can reduce their risk by making key lifestyle changes, including participating in regular activity and maintaining good heart health. Based on this research, the Alzheimer's Association offers 10 Ways to Love Your Brain -- a collection of tips that can reduce the risk of cognitive decline.  Heart-head connection  New research shows there are things we can do to reduce the risk of mild cognitive impairment and dementia.  Several conditions known to increase the risk of cardiovascular disease -- such as high blood pressure, diabetes and high cholesterol -- also increase the risk of developing Alzheimer's. Some autopsy studies show that as many as 80 percent of individuals with Alzheimer's disease also have cardiovascular disease.  A longstanding question is why some people develop hallmark Alzheimer's plaques and tangles but do not develop the symptoms of Alzheimer's. Vascular disease may help researchers eventually find an answer. Some autopsy studies suggest that plaques and tangles may be present in the brain without causing symptoms of cognitive decline unless the brain also shows evidence of vascular disease. More research is needed to better understand the link between vascular health and Alzheimer's.  Physical exercise and diet Regular physical exercise may be a beneficial strategy to lower the risk of Alzheimer's and vascular dementia. Exercise may directly benefit brain cells by increasing blood and oxygen flow in the brain. Because of its known cardiovascular benefits, a medically approved exercise program is a valuable part of any overall wellness  plan.  Current evidence suggests that heart-healthy eating may also help protect the brain. Heart-healthy eating includes limiting the intake of sugar and saturated fats and making sure to eat plenty of fruits, vegetables, and whole grains. No one diet is best. Two diets that have been studied and may be beneficial are the DASH (Dietary Approaches to Stop Hypertension) diet and the Mediterranean diet. The DASH diet emphasizes vegetables, fruits and fat-free or low-fat dairy products; includes whole grains, fish, poultry, beans, seeds, nuts and vegetable oils; and limits sodium, sweets, sugary beverages and red meats. A Mediterranean diet includes relatively little red meat and emphasizes whole grains, fruits and vegetables, fish and shellfish, and nuts, olive oil and other healthy fats.  Social connections and intellectual activity A number of studies indicate that maintaining strong social connections and keeping mentally active as we age might lower the risk of cognitive decline and Alzheimer's. Experts are not certain about the reason for this association. It may be due to direct mechanisms through which social and mental stimulation strengthen connections between nerve cells in the brain.  Head trauma There appears to be a strong link between future risk of Alzheimer's and serious head trauma, especially when injury involves loss of consciousness. You can help reduce your risk of Alzheimer's by protecting your head.  Wear a seat belt  Use a helmet when participating in sports  "Fall-proof" your home   What you can do now While research is not yet conclusive, certain lifestyle choices, such as physical activity and diet, may help support brain  health and prevent Alzheimer's. Many of these lifestyle changes have been shown to lower the risk of other diseases, like heart disease and diabetes, which have been linked to Alzheimer's. With few drawbacks and plenty of known benefits, healthy lifestyle  choices can improve your health and possibly protect your brain.  Learn more about brain health. You can help increase our knowledge by considering participation in a clinical study. Our free clinical trial matching services, TrialMatch, can help you find clinical trials in your area that are seeking volunteers.  Understanding prevention research Here are some things to keep in mind about the research underlying much of our current knowledge about possible prevention:  Insights about potentially modifiable risk factors apply to large population groups, not to individuals. Studies can show that factor X is associated with outcome Y, but cannot guarantee that any specific person will have that outcome. As a result, you can "do everything right" and still have a serious health problem or "do everything wrong" and live to be 100.  Much of our current evidence comes from large epidemiological studies such as the [place] Aging Study, the Nurses' Health Study, the Adult Changes in Thought Study and the [employer]. These studies explore pre-existing behaviors and use statistical methods to relate those behaviors to health outcomes. This type of study can show an "association" between a factor and an outcome but cannot "prove" cause and effect. This is why we describe evidence based on these studies with such language as "suggests," "may show," "might protect," and "is associated with."  The gold standard for showing cause and effect is a clinical trial in which participants are randomly assigned to a prevention or risk management strategy or a control group. Researchers follow the two groups over time to see if their outcomes differ significantly.  It is unlikely that some prevention or risk management strategies will ever be tested in randomized trials for ethical or practical reasons. One example is exercise. Definitively testing the impact of exercise on Alzheimer's risk would require a huge  trial enrolling thousands of people and following them for many years. The expense and logistics of such a trial would be prohibitive, and it would require some people to go without exercise, a known health benefit.    Management of Memory Problems  There are some general things you can do to help manage your memory problems.  Your memory may not in fact recover, but by using techniques and strategies you will be able to manage your memory difficulties better.  1)  Establish a routine. Try to establish and then stick to a regular routine.  By doing this, you will get used to what to expect and you will reduce the need to rely on your memory.  Also, try to do things at the same time of day, such as taking your medication or checking your calendar first thing in the morning. Think about think that you can do as a part of a regular routine and make a list.  Then enter them into a daily planner to remind you.  This will help you establish a routine.  2)  Organize your environment. Organize your environment so that it is uncluttered.  Decrease visual stimulation.  Place everyday items such as keys or cell phone in the same place every day (ie.  Basket next to front door) Use post it notes with a brief message to yourself (ie. Turn off light, lock the door) Use labels to indicate where things go (ie.  Which cupboards are for food, dishes, etc.) Keep a notepad and pen by the telephone to take messages  3)  Memory Aids A diary or journal/notebook/daily planner Making a list (shopping list, chore list, to do list that needs to be done) Using an alarm as a reminder (kitchen timer or cell phone alarm) Using cell phone to store information (Notes, Calendar, Reminders) Calendar/White board placed in a prominent position Post-it notes  In order for memory aids to be useful, you need to have good habits.  It's no good remembering to make a note in your journal if you don't remember to look in it.  Try  setting aside a certain time of day to look in journal.  4)  Improving mood and managing fatigue. There may be other factors that contribute to memory difficulties.  Factors, such as anxiety, depression and tiredness can affect memory. Regular gentle exercise can help improve your mood and give you more energy. Simple relaxation techniques may help relieve symptoms of anxiety Try to get back to completing activities or hobbies you enjoyed doing in the past. Learn to pace yourself through activities to decrease fatigue. Find out about some local support groups where you can share experiences with others. Try and achieve 7-8 hours of sleep at night.Alzheimer's Disease Caregiver Guide Alzheimer's disease is a condition that makes a person: Forget things. Act differently. Have trouble paying attention and doing simple tasks. These things get worse with time. The tips below can help you care for the person. How to help manage lifestyle changes Tips to help with symptoms Be calm and patient. Give simple, short answers to questions. Avoid correcting the person in a negative way. Try not to take things personally, even if the person forgets your name. Do not argue with the person. This may make the person more upset. Tips to lessen frustration Make appointments and do daily tasks when the person is at his or her best. Take your time. Simple tasks may take longer. Allow plenty of time to complete tasks. Limit choices for the person. Involve the person in what you are doing. Keep things organized: Keep a daily routine. Organize medicines in a pillbox for each day of the week. Keep a calendar in a central location to remind the person of meetings or other activities. Avoid new or crowded places, if possible. Use simple words, short sentences, and a calm voice. Only give one direction at a time. Buy clothes and shoes that are easy to put on and take off. Try to change the subject if the person  becomes frustrated or angry. Tips to prevent injury  Keep floors clear. Remove rugs, magazine racks, and floor lamps. Keep hallways well-lit. Put a handrail and non-slip mat in the bathtub or shower. Put childproof locks on cabinets that have dangerous items in them. These items include medicine, alcohol, guns, toxic cleaning items, sharp tools, matches, and lighters. Put locks on doors where the person cannot see or reach them. This helps keep the person from going out of the house and getting lost. Be ready for emergencies. Keep a list of emergency phone numbers and addresses close by. Remove car keys and lock garage doors so that the person does not try to drive. Bracelets may be worn that track location and identify the person as having memory problems. This should be worn at all times for safety. Tips for the future  Discuss financial and legal planning early. People with this disease have trouble managing their money  as the disease gets worse. Get help from a professional. Talk about advance directives, safety, and daily care. Take these steps: Create a living will and choose a power of attorney. This is someone who can make decisions for the person with Alzheimer's disease when he or she can no longer do so. Discuss driving safety and when to stop driving. The person's doctor can help with this. If the person lives alone, make sure he or she is safe. Some people need extra help at home. Other people need more care at a nursing home or care center. How to recognize changes in the person's condition With this disease, memory problems and confusion slowly get worse. In time, the person may not know his or her friends and family members. The disease can also cause changes in behavior and mood, such as anxiety or anger. The person may see, hear, taste, smell, or feel things that are not real (hallucinate). These changes can come on all of a sudden. They may happen in response to something such  as: Pain. An infection. Changes in temperature or noise. Too much stimulation. Feeling lost or scared. Medicines. Where to find support Find out about services that can provide short-term care (respite care). These can allow you to take a break when you need it. Join a support group near you. These groups can help you: Learn ways to manage stress. Share experiences with others. Get emotional comfort and support. Learn about caregiving as the disease gets worse. Know what community resources are available. Where to find more information Alzheimer's Association: LimitL[employer]hu Contact a doctor if: The person has a fever. The person has a sudden behavior change that does not get better with calming strategies. The person is not able to take care of himself or herself at home. You are no longer able to care for the person. Get help right away if: The person has a sudden increase in confusion or new hallucinations. The person threatens you or anyone else, including himself or herself. Get help right away if you feel like your loved one may hurt himself or herself or others, or has thoughts about taking his or her own life. Go to your nearest emergency room or: Call your local emergency services (911 in the [place] Call the National Suicide Prevention Lifeline at 256-546-6991 or 988 in the [place] This is open 24 hours a day. Text the Crisis Text Line at 918-105-3655. Summary Alzheimer's disease causes a person to forget things. A person who has this condition may have trouble doing simple tasks. Take steps to keep the person from getting hurt. Plan for future care. You can find support by joining a support group near you. This information is not intended to replace advice given to you by your health care provider. Make sure you discuss any questions you have with your health care provider. Document Revised: 11/21/2020 Document Reviewed: 08/15/2019 Elsevier Patient Education  2024 [employer].

## 2023-03-24 NOTE — Progress Notes (Signed)
Provider:  Melvyn Novas, MD  Primary Care Physician:  Tresa Garter, MD [address] [place] [address]     Referring Provider: Tresa Garter, Md [address] [place],  [place] [address]          Chief Complaint according to patient   Patient presents with:     MEMORY Patient , here with her husband Dr Janee Morn.  Both live in the some private home, ADL are affected.            HISTORY OF PRESENT ILLNESS:  Wendy Serrano is a 77 y.o. female patient who is here for revisit 03/24/2023 for Neuro-cognitive disorder.  this is a revisit earlier than scheduled, last seen August 2024  Chief concern according to patient :  none. She is taking pregabalin - per Dr Posey Rea , for a scalp dysesthesia. This was never an issue in our visits. She also mentioned a tinnitus, neither pulsatile, neither a high pitched sound.   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 She is writing a shopping list , but ventures off. She needs reminders, but is able to dress and feed herself. She is well groomed.   mini-mental status exam was not given today, for unknown reasons the patient had aMOCA test today.      03/24/2023   10:47 AM 08/12/2022    8:20 AM  Montreal Cognitive Assessment   Visuospatial/ Executive (0/5) 0 1  Naming (0/3) 2 3  Attention: Read list of digits (0/2) 1 2  Attention: Read list of letters (0/1) 1 1  Attention: Serial 7 subtraction starting at 100 (0/3) 1 1  Language: Repeat phrase (0/2) 1 1  Language : Fluency (0/1) 0 0  Abstraction (0/2) 2 2  Delayed Recall (0/5) 0 0  Orientation (0/6) 3 3  Total 11 14       03/13/2022   10:41 AM 09/11/2021    2:34 PM 04/29/2021   10:21 AM 11/01/2020   10:04 AM 05/03/2020   10:00 AM 11/09/2018   10:35 AM 03/29/2018    8:40 AM  MMSE - Mini Mental State Exam  Orientation to time 5 5 4 4 5 4 5   Orientation to Place 5 5 5 5 5 5 5   Registration 3 3 3  3 3 3 3   Attention/ Calculation 2 5 2 1 5 5 5   Recall 2 1 1 1 2 1  0  Language- name 2 objects 2 2 2 2 2 2 2   Language- repeat 1 0 0 1 1 1 1   Language- follow 3 step command 2 3 3 3 3 3 3   Language- read & follow direction 1 1 1 1 1 1 1   Write a sentence 1 1 1 1 1 1 1   Copy design 1 0 0 1 1 0 1  Total score 25 26 22 23 29 26 27        Her husband asked if a repeat Brain MRI can be done- she had one in 2022 and in 2019 before, each ordered by Dr Anne Hahn.  "This MRI of the brain without contrast shows the following: 1.   Mild generalized cortical atrophy, probably normal for age and unchanged compared to the 2019 MRI. 2.   Scattered T2/FLAIR hyperintense single and confluent foci predominantly in the hemispheres and a few foci in the pons and cerebellum.  This is  most consistent with moderate chronic microvascular ischemic change.  None of the foci appear to be acute and there is no progression compared to the 2019 MRI. 3.   No acute findings."           Review of Systems: Out of a complete 14 system review, the patient complains of only the following symptoms, and all other reviewed systems are negative.:   Social History   Socioeconomic History   Marital status: Married    Spouse name: [name]   Number of children: 0   Years of education: college   Highest education level: Master's degree (e.g., MA, MS, MEng, MEd, MSW, MBA)  Occupational History   Occupation: Retired  Tobacco Use   Smoking status: Never   Smokeless tobacco: Never  Vaping Use   Vaping status: Never Used  Substance and Sexual Activity   Alcohol use: No   Drug use: No   Sexual activity: Never    Birth control/protection: Post-menopausal  Other Topics Concern   Not on file  Social History Narrative   [employer] BA; MEd-teaching;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 They are doing well   Marriage is in  good health and retirement is goodEnd-of-life: provided packet of information and forms for consideration (Oct'11)      Lives with husband   Right handed   Caffeine: zero   Social Determinants of Health   Financial Resource Strain: Low Risk  (12/19/2022)   Overall Financial Resource Strain (CARDIA)    Difficulty of Paying Living Expenses: Not hard at all  Food Insecurity: No Food Insecurity (12/19/2022)   Hunger Vital Sign    Worried About Running Out of Food in the Last Year: Never true    Ran Out of Food in the Last Year: Never true  Transportation Needs: No Transportation Needs (12/19/2022)   PRAPARE - Administrator, Civil Service (Medical): No    Lack of Transportation (Non-Medical): No  Physical Activity: Sufficiently Active (12/19/2022)   Exercise Vital Sign    Days of Exercise per Week: 3 days    Minutes of Exercise per Session: 50 min  Stress: No Stress Concern Present (12/19/2022)   [employer] of Occupational Health - Occupational Stress Questionnaire    Feeling of Stress : Not at all  Social Connections: Socially Integrated (12/19/2022)   Social Connection and Isolation Panel [NHANES]    Frequency of Communication with Friends and Family: More than three times a week    Frequency of Social Gatherings with Friends and Family: Never    Attends Religious Services: 1 to 4 times per year    Active Member of [employer] or Organizations: Yes    Attends Banker Meetings: 1 to 4 times per year    Marital Status: Married    Family History  Problem Relation Age of Onset   Prostate cancer Father        died   Pancreatic cancer Mother        died   Diabetes Mother    Hyperlipidemia Sister     Past Medical History:  Diagnosis Date   Allergic rhinitis    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 difficile diarrhea 06/2017   Cervicogenic headache 09/21/2019   Eczema    childhood   H/O osteopenia 01/09/04   H/O osteoporosis 2008   H/O varicella    Herpes simplex  without  mention of complication    uncomplicated   History of measles, mumps, or rubella    Leukopenia    chronic   Liver hemangioma    Macular degeneration oct '11   early and mild and stable   Menopausal symptoms    Renal cyst    bilateral   Rotator cuff tear    Yeast infection     Past Surgical History:  Procedure Laterality Date   ROTATOR CUFF REPAIR Left 08/2009   Left shoulder-rotator cuff repair [name])   WISDOM TOOTH EXTRACTION       Current Outpatient Medications on File Prior to Visit  Medication Sig Dispense Refill   aspirin 81 MG chewable tablet Chew 81 mg by mouth daily.     b complex vitamins tablet Take 1 tablet by mouth daily. 100 tablet 3   FOLIC ACID PO Take 4 tablets by mouth daily.     glucosamine-chondroitin 500-400 MG tablet Take 1 tablet by mouth 2 (two) times daily.     Magnesium 400 MG TABS Take 1 tablet by mouth daily.     memantine (NAMENDA) 10 MG tablet Take 1 tablet (10 mg total) by mouth 2 (two) times daily.     methotrexate (RHEUMATREX) 2.5 MG tablet 6 tablets on Saturday  3   prednisoLONE acetate (PRED FORTE) 1 % ophthalmic suspension Place 1 drop into both eyes daily.     pregabalin (LYRICA) 25 MG capsule Take 1 capsule (25 mg total) by mouth 2 (two) times daily. 60 capsule 5   rivastigmine (EXELON) 4.6 mg/24hr Place 1 patch (4.6 mg total) onto the skin daily. 90 patch 12   SIMBRINZA 1-0.2 % SUSP Place 1 drop into both eyes 2 (two) times daily.  6   tacrolimus (PROTOPIC) 0.1 % ointment Apply topically 2 (two) times daily. Apply 2 x daily to palms for 2 weeks then stop. Alternate with Clotrimazole/Betamethasone cream 100 g 2   UNABLE TO FIND Take 2 capsules by mouth daily. Med Name: Lions mane     UNABLE TO FIND Take 2 capsules by mouth daily. Med Name: Kristen Cardinal- for cholesterol     No current facility-administered medications on file prior to visit.    No Known Allergies   DIAGNOSTIC DATA (LABS, IMAGING, TESTING) - I reviewed patient records,  labs, notes, testing and imaging myself where available.  Lab Results  Component Value Date   WBC 6.2 01/07/2023   HGB 13.2 01/07/2023   HCT 40.1 01/07/2023   MCV 95.8 01/07/2023   PLT 265.0 01/07/2023      Component Value Date/Time   NA 141 01/07/2023 1450   K 4.0 01/07/2023 1450   CL 105 01/07/2023 1450   CO2 30 01/07/2023 1450   GLUCOSE 91 01/07/2023 1450   BUN 16 01/07/2023 1450   CREATININE 0.81 01/07/2023 1450   CALCIUM 9.6 01/07/2023 1450   PROT 7.2 01/07/2023 1450   ALBUMIN 4.1 01/07/2023 1450   AST 31 01/07/2023 1450   ALT 26 01/07/2023 1450   ALKPHOS 88 01/07/2023 1450   BILITOT 0.6 01/07/2023 1450   GFRNONAA 111.51 02/19/2010 1100   GFRAA 81 02/02/2008 0955   Lab Results  Component Value Date   CHOL 208 (H) 08/22/2020   HDL 86.40 08/22/2020   LDLCALC 112 (H) 08/22/2020   LDLDIRECT 127.9 03/09/2013   TRIG 51.0 08/22/2020   CHOLHDL 2 08/22/2020   Lab Results  Component Value Date   HGBA1C 5.4 02/26/2011   Lab Results  Component Value Date  VITAMINB12 >1504 (H) 06/26/2021   Lab Results  Component Value Date   TSH 3.69 01/07/2023    PHYSICAL EXAM:  Today's Vitals   03/24/23 1032 03/24/23 1055  BP: (!) 141/78 138/80  Pulse: 81 85  Weight: 136 lb (61.7 kg)   Height: 5\' 8"  (1.727 m)    Body mass index is 20.68 kg/m.   Wt Readings from Last 3 Encounters:  03/24/23 136 lb (61.7 kg)  01/07/23 130 lb (59 kg)  12/31/22 135 lb 12.8 oz (61.6 kg)     Ht Readings from Last 3 Encounters:  03/24/23 5\' 8"  (1.727 m)  01/07/23 5\' 7"  (1.702 m)  12/31/22 5\' 7"  (1.702 m)      General: The patient is awake, alert and appears not in acute distress. The patient is well groomed. Head: Normocephalic, atraumatic. Neck is supple. . Cardiovascular:  Regular rate and cardiac rhythm by pulse,  without distended neck veins. Respiratory: Lungs are clear to auscultation.  Skin:  Without evidence of ankle edema, or rash. Trunk: The patient's posture is erect.    NEUROLOGIC EXAM: The patient is awake and alert, oriented to place and time.   Memory subjective described as intact.  Attention span & concentration ability appears normal.  Speech is fluent,  without  dysarthria, dysphonia or aphasia.  Mood and affect are appropriate.   Deep tendon reflexes: in the  upper and lower extremities are symmetric and intact.  Babinski response was deferred    ASSESSMENT AND PLAN 77 y.o. year old female  here for a 45 minute full memory assessment with:    1) progressed Alzheimer's disease-  ATN positive ,  on Prevagen and [employer] supplement. The patient  has not qualified by [facility], needs MMSE- on Rivastigmin patches.  Not stooped , not incontinent, no behavior changes.   Her MMSE score was predictably better, but she displayed problems with serial 7 and with immediate recall. Was able to draw, but not a clock face with correct hands.  MMSE 23/ 30 .     I plan to follow up either personally or through our NP within 8-12 months.   I would like to thank Plotnikov, Georgina Quint, MD and Plotnikov, Georgina Quint, Md [address] [place],  [place] [address] for allowing me to meet with and to take care of this pleasant patient.    After spending a total time of  45  minutes face to face and additional time for physical and neurologic examination, review of laboratory studies,  personal review of imaging studies, reports and results of other testing and review of referral information / records as far as provided in visit,   Electronically signed by: Melvyn Novas, MD 03/24/2023 11:06 AM  [facility] Board certified by The [employer] of Sleep Medicine and Diplomate of the [employer] of Sleep Medicine. Board certified In Neurology through the ABPN, Fellow of the [employer] of Neurology.

## 2023-03-26 ENCOUNTER — Telehealth: Payer: Self-pay | Admitting: Neurology

## 2023-03-26 DIAGNOSIS — D84821 Immunodeficiency due to drugs: Secondary | ICD-10-CM | POA: Diagnosis not present

## 2023-03-26 DIAGNOSIS — B351 Tinea unguium: Secondary | ICD-10-CM | POA: Diagnosis not present

## 2023-03-26 DIAGNOSIS — G309 Alzheimer's disease, unspecified: Secondary | ICD-10-CM | POA: Diagnosis not present

## 2023-03-26 DIAGNOSIS — B353 Tinea pedis: Secondary | ICD-10-CM | POA: Diagnosis not present

## 2023-03-26 DIAGNOSIS — L409 Psoriasis, unspecified: Secondary | ICD-10-CM | POA: Diagnosis not present

## 2023-03-26 DIAGNOSIS — J309 Allergic rhinitis, unspecified: Secondary | ICD-10-CM | POA: Diagnosis not present

## 2023-03-26 DIAGNOSIS — H409 Unspecified glaucoma: Secondary | ICD-10-CM | POA: Diagnosis not present

## 2023-03-26 DIAGNOSIS — R030 Elevated blood-pressure reading, without diagnosis of hypertension: Secondary | ICD-10-CM | POA: Diagnosis not present

## 2023-03-26 DIAGNOSIS — G629 Polyneuropathy, unspecified: Secondary | ICD-10-CM | POA: Diagnosis not present

## 2023-03-26 NOTE — Telephone Encounter (Signed)
Cohere Berkley Harvey: [id number] exp. 03/26/23-05/25/23 sent to GI [phone]

## 2023-03-27 ENCOUNTER — Telehealth: Payer: Medicare PPO | Admitting: Nurse Practitioner

## 2023-04-01 ENCOUNTER — Encounter: Payer: Self-pay | Admitting: Internal Medicine

## 2023-04-02 ENCOUNTER — Ambulatory Visit
Admission: RE | Admit: 2023-04-02 | Discharge: 2023-04-02 | Disposition: A | Discharge status: Home / Self Care | Payer: Medicare PPO | Source: Ambulatory Visit | Attending: Internal Medicine | Admitting: Internal Medicine

## 2023-04-02 ENCOUNTER — Encounter: Payer: Self-pay | Admitting: Internal Medicine

## 2023-04-02 ENCOUNTER — Ambulatory Visit: Payer: Medicare PPO | Admitting: Internal Medicine

## 2023-04-02 ENCOUNTER — Ambulatory Visit
Admission: RE | Admit: 2023-04-02 | Discharge: 2023-04-02 | Disposition: A | Discharge status: Home / Self Care | Payer: Medicare PPO | Source: Ambulatory Visit | Attending: Neurology | Admitting: Neurology

## 2023-04-02 VITALS — BP 130/78 | HR 82 | Temp 98.9°F | Ht 68.0 in | Wt 137.2 lb

## 2023-04-02 DIAGNOSIS — G309 Alzheimer's disease, unspecified: Secondary | ICD-10-CM

## 2023-04-02 DIAGNOSIS — R1030 Lower abdominal pain, unspecified: Secondary | ICD-10-CM

## 2023-04-02 DIAGNOSIS — R42 Dizziness and giddiness: Secondary | ICD-10-CM

## 2023-04-02 DIAGNOSIS — R413 Other amnesia: Secondary | ICD-10-CM

## 2023-04-02 MED ORDER — IOPAMIDOL (ISOVUE-300) INJECTION 61%
100.0000 mL | Freq: Once | INTRAVENOUS | Status: AC | PRN
  Administered 2023-04-02: 80 mL via INTRAVENOUS

## 2023-04-02 NOTE — Assessment & Plan Note (Signed)
Dr. Vickey Huger confirmed that she does want to stop the Aricept (Donepezil) and add there Exelon (Rivastigmine) patch. She is to continue taking the Namenda (Memantine) 10 mg tablet twice a day.

## 2023-04-02 NOTE — Patient Instructions (Signed)
Rice sock heating pad 

## 2023-04-02 NOTE — Progress Notes (Signed)
Subjective:  Patient ID: Wendy Serrano, female    DOB: 1945/08/07  Age: 77 y.o. MRN: [id number]  CC: Abdominal Pain (Recurrent stomach discomfort (going on since this Summer). No change in bowel movements. Does not eat much generally. Treating with tylenol ) and Tinnitus (L ringing in ear and pressure for several years)   HPI Wendy Serrano presents for chronic abdominal bloating, mild pain, discomfort.  Complains of occasional nausea.  There are chronic complaints of ringing in the ears, dizziness, memory loss... The patient is here with her husband.  Outpatient Medications Prior to Visit  Medication Sig Dispense Refill   aspirin 81 MG chewable tablet Chew 81 mg by mouth daily.     b complex vitamins tablet Take 1 tablet by mouth daily. 100 tablet 3   FOLIC ACID PO Take 4 tablets by mouth daily.     glucosamine-chondroitin 500-400 MG tablet Take 1 tablet by mouth 2 (two) times daily.     Magnesium 400 MG TABS Take 1 tablet by mouth daily.     memantine (NAMENDA) 10 MG tablet Take 1 tablet (10 mg total) by mouth 2 (two) times daily.     methotrexate (RHEUMATREX) 2.5 MG tablet 6 tablets on Saturday  3   prednisoLONE acetate (PRED FORTE) 1 % ophthalmic suspension Place 1 drop into both eyes daily.     pregabalin (LYRICA) 25 MG capsule Take 1 capsule (25 mg total) by mouth 2 (two) times daily. 60 capsule 5   rivastigmine (EXELON) 4.6 mg/24hr Place 1 patch (4.6 mg total) onto the skin daily. 90 patch 3   SIMBRINZA 1-0.2 % SUSP Place 1 drop into both eyes 2 (two) times daily.  6   tacrolimus (PROTOPIC) 0.1 % ointment Apply topically 2 (two) times daily. Apply 2 x daily to palms for 2 weeks then stop. Alternate with Clotrimazole/Betamethasone cream 100 g 2   UNABLE TO FIND Take 2 capsules by mouth daily. Med Name: Lions mane     UNABLE TO FIND Take 2 capsules by mouth daily. Med Name: Kristen Cardinal- for cholesterol     No facility-administered medications prior to visit.    ROS: Review of  Systems  Constitutional:  Positive for fatigue. Negative for activity change, appetite change, chills and unexpected weight change.  HENT:  Negative for congestion, mouth sores and sinus pressure.   Eyes:  Negative for visual disturbance.  Respiratory:  Negative for cough and chest tightness.   Gastrointestinal:  Positive for abdominal pain, constipation and nausea. Negative for diarrhea, rectal pain and vomiting.  Genitourinary:  Negative for difficulty urinating, frequency and vaginal pain.  Musculoskeletal:  Negative for back pain and gait problem.  Skin:  Negative for pallor and rash.  Neurological:  Positive for weakness. Negative for dizziness, tremors, numbness and headaches.  Hematological:  Does not bruise/bleed easily.  Psychiatric/Behavioral:  Positive for sleep disturbance. Negative for confusion.     Objective:  BP 130/78   Pulse 82   Temp 98.9 F (37.2 C) (Oral)   Ht 5\' 8"  (1.727 m)   Wt 137 lb 3.2 oz (62.2 kg)   SpO2 97%   BMI 20.86 kg/m   BP Readings from Last 3 Encounters:  04/02/23 130/78  03/24/23 138/80  01/07/23 104/66    Wt Readings from Last 3 Encounters:  04/02/23 137 lb 3.2 oz (62.2 kg)  03/24/23 136 lb (61.7 kg)  01/07/23 130 lb (59 kg)    Physical Exam Constitutional:      General: She is  not in acute distress.    Appearance: She is well-developed.  HENT:     Head: Normocephalic.     Right Ear: External ear normal.     Left Ear: External ear normal.     Nose: Nose normal.  Eyes:     General:        Right eye: No discharge.        Left eye: No discharge.     Conjunctiva/sclera: Conjunctivae normal.     Pupils: Pupils are equal, round, and reactive to light.  Neck:     Thyroid: No thyromegaly.     Vascular: No JVD.     Trachea: No tracheal deviation.  Cardiovascular:     Rate and Rhythm: Normal rate and regular rhythm.     Heart sounds: Normal heart sounds.  Pulmonary:     Effort: No respiratory distress.     Breath sounds: No  stridor. No wheezing.  Abdominal:     General: Bowel sounds are normal. There is no distension.     Palpations: Abdomen is soft. There is no mass.     Tenderness: There is no abdominal tenderness. There is no guarding or rebound.  Musculoskeletal:        General: No tenderness.     Cervical back: Normal range of motion and neck supple. No rigidity.  Lymphadenopathy:     Cervical: No cervical adenopathy.  Skin:    Findings: No erythema or rash.  Neurological:     General: No focal deficit present.     Cranial Nerves: No cranial nerve deficit.     Motor: No abnormal muscle tone.     Coordination: Coordination normal.     Deep Tendon Reflexes: Reflexes normal.  Psychiatric:        Mood and Affect: Mood is not anxious.        Behavior: Behavior normal.        Thought Content: Thought content normal.        Judgment: Judgment normal.   Abdomen is soft, nontender, sensitive to palpation primarily in the left lower quadrant area  Lab Results  Component Value Date   WBC 6.2 01/07/2023   HGB 13.2 01/07/2023   HCT 40.1 01/07/2023   PLT 265.0 01/07/2023   GLUCOSE 91 01/07/2023   CHOL 208 (H) 08/22/2020   TRIG 51.0 08/22/2020   HDL 86.40 08/22/2020   LDLDIRECT 127.9 03/09/2013   LDLCALC 112 (H) 08/22/2020   ALT 26 01/07/2023   AST 31 01/07/2023   NA 141 01/07/2023   K 4.0 01/07/2023   CL 105 01/07/2023   CREATININE 0.81 01/07/2023   BUN 16 01/07/2023   CO2 30 01/07/2023   TSH 3.69 01/07/2023   INR 1.1 (H) 05/16/2015   HGBA1C 5.4 02/26/2011    No results found.  Assessment & Plan:   Problem List Items Addressed This Visit     Abdominal pain - Primary    Worse Unclear etiology.  Most likely the problem is related to chronic constipation Will get abd and pelvis CT  Miralax/LOC prn -discussed with the patient and her husband      Relevant Orders   CT ABDOMEN PELVIS W CONTRAST (Completed)   Vertigo    Chronic recurrent dizziness.  Follow-up with ENT if needed.       Memory difficulties    Dr. Vickey Huger confirmed that she does want to stop the Aricept (Donepezil) and add there Exelon (Rivastigmine) patch. She is to continue taking the Namenda (  Memantine) 10 mg tablet twice a day.              No orders of the defined types were placed in this encounter.     Follow-up: Return in about 3 months (around 07/03/2023) for a follow-up visit.  Sonda Primes, MD

## 2023-04-02 NOTE — Assessment & Plan Note (Addendum)
Worse Unclear etiology.  Most likely the problem is related to chronic constipation Will get abd and pelvis CT  Miralax/LOC prn -discussed with the patient and her husband

## 2023-04-12 NOTE — Assessment & Plan Note (Signed)
Chronic recurrent dizziness.  Follow-up with ENT if needed.

## 2023-04-16 ENCOUNTER — Telehealth: Payer: Self-pay

## 2023-04-16 NOTE — Telephone Encounter (Signed)
-----   Message from [place] Dohmeier sent at 04/15/2023  4:37 PM EST ----- Mild generalized cortical atrophy that is more pronounced in the medial temporal lobes.  The atrophy has progressed compared to the MRI from 2022.  Though not entirely specific, this pattern can be seen with Alzheimer's disease. Patient aware of her dx of dementia.

## 2023-04-16 NOTE — Telephone Encounter (Signed)
I called patient. I discussed her MRI results with her. She will keep her appointment with Dr. Vickey Huger on 05/11/23. She was wondering if she qualified for [employer]. I advised her that Dr. Vickey Huger would discuss this with her at the upcoming appointment. Pt verbalized understanding of results. Pt had no questions at this time but was encouraged to call back if questions arise.

## 2023-04-20 ENCOUNTER — Other Ambulatory Visit: Payer: Self-pay | Admitting: Internal Medicine

## 2023-04-20 ENCOUNTER — Other Ambulatory Visit: Payer: Self-pay | Admitting: Dermatology

## 2023-04-22 ENCOUNTER — Other Ambulatory Visit: Payer: Self-pay | Admitting: Internal Medicine

## 2023-04-27 DIAGNOSIS — H40013 Open angle with borderline findings, low risk, bilateral: Secondary | ICD-10-CM | POA: Diagnosis not present

## 2023-05-11 ENCOUNTER — Ambulatory Visit: Payer: Medicare PPO | Admitting: Neurology

## 2023-05-11 ENCOUNTER — Encounter: Payer: Self-pay | Admitting: Neurology

## 2023-05-11 VITALS — BP 138/64 | HR 70 | Ht 68.0 in | Wt 135.0 lb

## 2023-05-11 DIAGNOSIS — F0280 Dementia in other diseases classified elsewhere without behavioral disturbance: Secondary | ICD-10-CM

## 2023-05-11 DIAGNOSIS — G301 Alzheimer's disease with late onset: Secondary | ICD-10-CM | POA: Diagnosis not present

## 2023-05-11 DIAGNOSIS — R4189 Other symptoms and signs involving cognitive functions and awareness: Secondary | ICD-10-CM

## 2023-05-11 DIAGNOSIS — R413 Other amnesia: Secondary | ICD-10-CM

## 2023-05-11 MED ORDER — LEQEMBI 200 MG/2ML IV SOLN: INTRAVENOUS | 6 refills | Status: DC

## 2023-05-11 NOTE — Addendum Note (Signed)
Addended by: Melvyn Novas on: 05/11/2023 11:36 AM   Modules accepted: Orders

## 2023-05-11 NOTE — Patient Instructions (Addendum)
Lecanemab Injection What is this medication? LECANEMAB (lek AN e mab) treats Alzheimer disease. It works by decreasing the buildup of amyloid, a protein that may cause Alzheimer disease. This may slow down the worsening of symptoms. It is a monoclonal antibody. This medicine may be used for other purposes; ask your health care provider or pharmacist if you have questions. COMMON BRAND NAME(S): LEQEMBI What should I tell my care team before I take this medication? They need to know if you have any of these conditions: An unusual or allergic reaction to lecanemab, other medications, foods, dyes, or preservatives Take medications that treat or prevent blood clots Pregnant or trying to get pregnant Breast-feeding How should I use this medication? This medication is injected into a vein. It is given by your care team in a hospital or clinic setting. A special MedGuide will be given to you before each treatment. Be sure to read this information carefully each time. Talk to your care team about the use of this medication in children. Special care may be needed. Overdosage: If you think you have taken too much of this medicine contact a poison control center or emergency room at once. NOTE: This medicine is only for you. Do not share this medicine with others. What if I miss a dose? Keep appointments for follow-up doses. It is important not to miss your dose. Call your care team if you are unable to keep an appointment. What may interact with this medication? Interactions have not been studied. This list may not describe all possible interactions. Give your health care provider a list of all the medicines, herbs, non-prescription drugs, or dietary supplements you use. Also tell them if you smoke, drink alcohol, or use illegal drugs. Some items may interact with your medicine. What should I watch for while using this medication? Visit your care team for regular checks on your progress. Tell your care  team if your symptoms do not start to get better or if they get worse. What side effects may I notice from receiving this medication? Side effects that you should report to your care team as soon as possible: Allergic reactions or angioedema--skin rash, itching or hives, swelling of the face, eyes, lips, tongue, arms, or legs, trouble swallowing or breathing Headache, worsening confusion, dizziness, change in vision, nausea, seizures Infusion reactions--chest pain, shortness of breath or trouble breathing, feeling faint or lightheaded Side effects that usually do not require medical attention (report these to your care team if they continue or are bothersome): Cough Diarrhea Headache This list may not describe all possible side effects. Call your doctor for medical advice about side effects. You may report side effects to FDA at 1-800-FDA-1088. Where should I keep my medication? This medication is given in a hospital or clinic. It will not be stored at home. NOTE: This sheet is a summary. It may not cover all possible information. If you have questions about this medicine, talk to your doctor, pharmacist, or health care provider.  2024 Elsevier/Gold Standard (2021-11-19 00:00:00)   Alzheimer's Disease Caregiver Guide Alzheimer's disease is a condition that makes a person: Forget things. Act differently. Have trouble paying attention and doing simple tasks. These things get worse with time. The tips below can help you care for the person. How to help manage lifestyle changes Tips to help with symptoms Be calm and patient. Give simple, short answers to questions. Avoid correcting the person in a negative way. Try not to take things personally, even if the person  forgets your name. Do not argue with the person. This may make the person more upset. Tips to lessen frustration Make appointments and do daily tasks when the person is at his or her best. Take your time. Simple tasks may take  longer. Allow plenty of time to complete tasks. Limit choices for the person. Involve the person in what you are doing. Keep things organized: Keep a daily routine. Organize medicines in a pillbox for each day of the week. Keep a calendar in a central location to remind the person of meetings or other activities. Avoid new or crowded places, if possible. Use simple words, short sentences, and a calm voice. Only give one direction at a time. Buy clothes and shoes that are easy to put on and take off. Try to change the subject if the person becomes frustrated or angry. Tips to prevent injury  Keep floors clear. Remove rugs, magazine racks, and floor lamps. Keep hallways well-lit. Put a handrail and non-slip mat in the bathtub or shower. Put childproof locks on cabinets that have dangerous items in them. These items include medicine, alcohol, guns, toxic cleaning items, sharp tools, matches, and lighters. Put locks on doors where the person cannot see or reach them. This helps keep the person from going out of the house and getting lost. Be ready for emergencies. Keep a list of emergency phone numbers and addresses close by. Remove car keys and lock garage doors so that the person does not try to drive. Bracelets may be worn that track location and identify the person as having memory problems. This should be worn at all times for safety. Tips for the future  Discuss financial and legal planning early. People with this disease have trouble managing their money as the disease gets worse. Get help from a professional. Talk about advance directives, safety, and daily care. Take these steps: Create a living will and choose a power of attorney. This is someone who can make decisions for the person with Alzheimer's disease when he or she can no longer do so. Discuss driving safety and when to stop driving. The person's doctor can help with this. If the person lives alone, make sure he or she is  safe. Some people need extra help at home. Other people need more care at a nursing home or care center. How to recognize changes in the person's condition With this disease, memory problems and confusion slowly get worse. In time, the person may not know his or her friends and family members. The disease can also cause changes in behavior and mood, such as anxiety or anger. The person may see, hear, taste, smell, or feel things that are not real (hallucinate). These changes can come on all of a sudden. They may happen in response to something such as: Pain. An infection. Changes in temperature or noise. Too much stimulation. Feeling lost or scared. Medicines. Where to find support Find out about services that can provide short-term care (respite care). These can allow you to take a break when you need it. Join a support group near you. These groups can help you: Learn ways to manage stress. Share experiences with others. Get emotional comfort and support. Learn about caregiving as the disease gets worse. Know what community resources are available. Where to find more information Alzheimer's Association: LimitLaws.hu Contact a doctor if: The person has a fever. The person has a sudden behavior change that does not get better with calming strategies. The person is not able  to take care of himself or herself at home. You are no longer able to care for the person. Get help right away if: The person has a sudden increase in confusion or new hallucinations. The person threatens you or anyone else, including himself or herself. Get help right away if you feel like your loved one may hurt himself or herself or others, or has thoughts about taking his or her own life. Go to your nearest emergency room or: Call your local emergency services (911 in the [place] Call the National Suicide Prevention Lifeline at (661)402-1611 or 988 in the [place] This is open 24 hours a day. Text the Crisis Text Line  at 6124920983. Summary Alzheimer's disease causes a person to forget things. A person who has this condition may have trouble doing simple tasks. Take steps to keep the person from getting hurt. Plan for future care. You can find support by joining a support group near you. This information is not intended to replace advice given to you by your health care provider. Make sure you discuss any questions you have with your health care provider. Document Revised: 11/21/2020 Document Reviewed: 08/15/2019 Elsevier Patient Education  2024 Elsevier Inc.  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     1)  progressed Alzheimer's disease-  ATN positive , MRI typical , no hemorrhages seen.    on Prevagen and [employer]. The patient  has not qualified by [facility], needs MMSE- on Rivastigmin patches.  Not stooped , not incontinent, no behavior changes.    Her MMSE score was predictably better, but she displayed problems with serial 7 and with immediate recall. Was able to draw, but not a clock face with correct hands.  MMSE 23/ 30 - 25/ 30 with WORLD spelling. .     I plan to  order a PET amyloid scan and we then can enlist her into MAB Leqembi follow up either personally or through our NP within  months.

## 2023-05-11 NOTE — Progress Notes (Signed)
Provider:  Melvyn Novas, MD  Primary Care Physician:  Tresa Garter, MD [address] [place] [address]     Referring Provider: Plotnikov, Georgina Quint, Md [address] [place],  [place] [address]          Chief Complaint according to patient   Patient presents with:     New Patient (Initial Visit)           HISTORY OF PRESENT ILLNESS:  Wendy Serrano is a 77 y.o. female patient who is here for revisit 05/11/2023 for  meeting to discuss MRI, no changes over MRI 1 -2022, .  Chief concern according to patient :  I want to know if I am a candidate for the MAB therapy.    Wendy Serrano is a 77 y.o. female patient who is here for revisit 03/24/2023 for Neuro-cognitive disorder.  this is a revisit earlier than scheduled, last seen August 2024  Chief concern according to patient :  none. She is taking pregabalin - per Dr Posey Rea , for a scalp dysesthesia. This was never an issue in our visits. She also mentioned a tinnitus, neither pulsatile, neither a high pitched sound.     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 She is writing a shopping list , but ventures off. She needs reminders, but is able to dress and feed herself. She is well groomed.    mini-mental status exam was not given today, for unknown reasons the patient had a MOCA test 11/ 24.  MMSE 25/ 30.      Review of Systems: Out of a complete 14 system review, the patient complains of only the following symptoms, and all other reviewed systems are negative.:   Social History   Socioeconomic History   Marital status: Married    Spouse name: [name]   Number of children: 0   Years of education: college   Highest education level: Master's degree (e.g., MA, MS, MEng, MEd, MSW, MBA)  Occupational History   Occupation: Retired  Tobacco Use   Smoking status: Never   Smokeless tobacco: Never  Vaping Use   Vaping status: Never Used   Substance and Sexual Activity   Alcohol use: No   Drug use: No   Sexual activity: Never    Birth control/protection: Post-menopausal  Other Topics Concern   Not on file  Social History Narrative   [employer] BA; MEd-teaching;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 They are doing well   Marriage is in good health and retirement is goodEnd-of-life: provided packet of information and forms for consideration (Oct'11)      Lives with husband   Right handed   Caffeine: zero   Social Drivers of Health   Financial Resource Strain: Low Risk  (12/19/2022)   Overall Financial Resource Strain (CARDIA)    Difficulty of Paying Living Expenses: Not hard at all  Food Insecurity: No Food Insecurity (12/19/2022)   Hunger Vital Sign    Worried About Running Out of Food in the Last Year: Never true    Ran Out of Food in the Last Year: Never true  Transportation Needs: No Transportation Needs (12/19/2022)   PRAPARE - Administrator, Civil Service (Medical): No    Lack of Transportation (Non-Medical): No  Physical Activity: Sufficiently Active (  12/19/2022)   Exercise Vital Sign    Days of Exercise per Week: 3 days    Minutes of Exercise per Session: 50 min  Stress: No Stress Concern Present (12/19/2022)   [employer] of Occupational Health - Occupational Stress Questionnaire    Feeling of Stress : Not at all  Social Connections: Socially Integrated (12/19/2022)   Social Connection and Isolation Panel [NHANES]    Frequency of Communication with Friends and Family: More than three times a week    Frequency of Social Gatherings with Friends and Family: Never    Attends Religious Services: 1 to 4 times per year    Active Member of [employer] or Organizations: Yes    Attends Banker Meetings: Not on file    Marital Status: Married    Family History  Problem Relation Age of Onset    Prostate cancer Father        died   Pancreatic cancer Mother        died   Diabetes Mother    Hyperlipidemia Sister     Past Medical History:  Diagnosis Date   Allergic rhinitis    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     b complex vitamins tablet Take 1 tablet by mouth daily. 100 tablet 3   FOLIC ACID PO Take 4 tablets by mouth daily.     glucosamine-chondroitin 500-400 MG tablet Take 1 tablet by mouth 2 (two) times daily.     Magnesium 400 MG TABS Take 1 tablet by mouth daily.     memantine (NAMENDA) 10 MG tablet Take 1 tablet (10 mg total) by mouth 2 (two) times daily.     methotrexate (RHEUMATREX) 2.5 MG tablet 6 tablets on Saturday  3   prednisoLONE acetate (PRED FORTE) 1 % ophthalmic suspension Place 1 drop into both eyes daily.     pregabalin (LYRICA) 25 MG capsule TAKE 1 CAPSULE BY MOUTH 2 TIMES DAILY. 60 capsule 5   rivastigmine (EXELON) 4.6 mg/24hr Place 1 patch (4.6 mg total) onto the skin daily. 90 patch 3   SIMBRINZA 1-0.2 % SUSP Place 1 drop into both eyes 2 (two) times daily.  6   tacrolimus (PROTOPIC) 0.1 % ointment Apply topically 2 (two) times daily. Apply 2 x daily to palms for 2 weeks then stop. Alternate  with Clotrimazole/Betamethasone cream 100 g 2   UNABLE TO FIND Take 2 capsules by mouth daily. Med Name: Lions mane     UNABLE TO FIND Take 2 capsules by mouth daily. Med Name: Kristen Cardinal- for  cholesterol     No current facility-administered medications on file prior to visit.    No Known Allergies   DIAGNOSTIC DATA (LABS, IMAGING, TESTING) - I reviewed patient records, labs, notes, testing and imaging myself where available.  Lab Results  Component Value Date   WBC 6.2 01/07/2023   HGB 13.2 01/07/2023   HCT 40.1 01/07/2023   MCV 95.8 01/07/2023   PLT 265.0 01/07/2023      Component Value Date/Time   NA 141 01/07/2023 1450   K 4.0 01/07/2023 1450   CL 105 01/07/2023 1450   CO2 30 01/07/2023 1450   GLUCOSE 91 01/07/2023 1450   BUN 16 01/07/2023 1450   CREATININE 0.81 01/07/2023 1450   CALCIUM 9.6 01/07/2023 1450   PROT 7.2 01/07/2023 1450   ALBUMIN 4.1 01/07/2023 1450   AST 31 01/07/2023 1450   ALT 26 01/07/2023 1450   ALKPHOS 88 01/07/2023 1450   BILITOT 0.6 01/07/2023 1450   GFRNONAA 111.51 02/19/2010 1100   GFRAA 81 02/02/2008 0955   Lab Results  Component Value Date   CHOL 208 (H) 08/22/2020   HDL 86.40 08/22/2020   LDLCALC 112 (H) 08/22/2020   LDLDIRECT 127.9 03/09/2013   TRIG 51.0 08/22/2020   CHOLHDL 2 08/22/2020   Lab Results  Component Value Date   HGBA1C 5.4 02/26/2011   Lab Results  Component Value Date   VITAMINB12 >1504 (H) 06/26/2021   Lab Results  Component Value Date   TSH 3.69 01/07/2023    PHYSICAL EXAM:  Today's Vitals   05/11/23 1032  BP: 138/64  Pulse: 70  Weight: 135 lb (61.2 kg)  Height: 5\' 8"  (1.727 m)   Body mass index is 20.53 kg/m.   Wt Readings from Last 3 Encounters:  05/11/23 135 lb (61.2 kg)  04/02/23 137 lb 3.2 oz (62.2 kg)  03/24/23 136 lb (61.7 kg)     Ht Readings from Last 3 Encounters:  05/11/23 5\' 8"  (1.727 m)  04/02/23 5\' 8"  (1.727 m)  03/24/23 5\' 8"  (1.727 m)      General: The patient is awake,  alert and appears not in acute distress. The patient is well groomed. Head: Normocephalic, atraumatic. Neck is supple. Cardiovascular:  Regular rate and cardiac rhythm by pulse,  without distended neck veins. Respiratory: Lungs are clear to auscultation.  Skin:  Without evidence of ankle edema, or rash. Trunk: The patient's posture is erect.   NEUROLOGIC EXAM: The patient is awake and alert, oriented to place and time.   Memory subjective described as intact.   MMSE 24/ 30 today.    03/24/2023   12:07 PM 03/13/2022   10:41 AM 09/11/2021    2:34 PM 04/29/2021   10:21 AM 11/01/2020   10:04 AM 05/03/2020   10:00 AM 11/09/2018   10:35 AM  MMSE - Mini Mental State Exam  Orientation to time 3 5 5 4 4 5 4   Orientation to Place 5 5 5 5 5 5 5   Registration 3 3 3 3 3 3 3   Attention/ Calculation 4 2 5 2 1 5 5   Recall 0 2 1 1 1 2 1   Language- name 2 objects 2 2 2 2 2 2 2   Language- repeat 1 1 0 0 1 1 1   Language- follow 3 step command 3 2 3 3 3 3 3   Language- read & follow direction 1 1 1 1 1 1 1   Write a sentence 1 1 1 1 1 1 1   Copy  design 0 1 0 0 1 1 0  Total score 23 25 26 22 23 29 26      Attention span & concentration ability appears normal.  Speech is fluent,  without  dysarthria, dysphonia or aphasia.  Mood and affect are appropriate.   Coordination: Rapid alternating movements in the fingers/hands were of normal speed.  The Finger-to-nose maneuver was intact without evidence of ataxia, dysmetria or tremor.   Gait and station: Patient could rise unassisted from a seated position, walked without assistive device.  Stance is of normal width/ base..  Toe and heel walk were deferred.  Deep tendon reflexes: in the  upper and lower extremities are symmetric and intact.  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     1)  progressed Alzheimer's disease-  ATN positive , MRI typical , no hemorrhages seen.    on Prevagen and [employer]. The patient  has not qualified by [facility], needs MMSE- on Rivastigmin patches.  Not stooped , not incontinent, no behavior changes. She voluntarily quit driving.    Her MMSE score was predictably better, but she displayed problems with serial 7 and with immediate recall. Was able to draw, but not a clock face with correct hands.  MMSE 23/ 30 - 25/ 30 with WORLD spelling. .     I plan to  order a PET amyloid scan and we then can enlist her into MAB Leqembi follow up either personally or through our NP within  months.   I would like to thank Plotnikov, Georgina Quint, MD and Plotnikov, Georgina Quint, Md [address] [place],  [place] [address] for allowing me to meet with and to take care of this pleasant patient.    After spending a total time of  30  minutes face to face and additional time for physical and neurologic examination, review of laboratory studies,  personal review of imaging studies, reports and results of other testing and review of referral information / records as far as provided in visit,   Electronically signed by: Melvyn Novas, MD 05/11/2023 10:59 AM  [facility] Board certified by The [employer] of Sleep Medicine and Diplomate of the [employer] of Sleep Medicine. Board certified In Neurology through the ABPN, Fellow of the [employer] of Neurology.

## 2023-05-14 ENCOUNTER — Encounter: Payer: Self-pay | Admitting: Neurology

## 2023-05-18 ENCOUNTER — Encounter: Payer: Self-pay | Admitting: Neurology

## 2023-05-18 ENCOUNTER — Telehealth: Payer: Self-pay | Admitting: Neurology

## 2023-05-18 NOTE — Telephone Encounter (Signed)
 Spoke to husband Designer, Multimedia) answered questions about questionnaire sent via mychart Husband states will make a effort to answer questions and send back through mychjart Made husband aware not able to start  process  of approval for lequembi  until all paperwork is filled out Husband expressed understanding and thanked me for calling

## 2023-05-18 NOTE — Telephone Encounter (Signed)
 Pt's husband calling with questions about a questionnaire that was sent via MyChart. Requesting call back

## 2023-05-26 ENCOUNTER — Telehealth: Payer: Self-pay | Admitting: Neurology

## 2023-05-26 NOTE — Telephone Encounter (Signed)
 Cohere Berkley Harvey: [phone] exp. 07/18/23 sent to [facility] [phone]

## 2023-06-03 ENCOUNTER — Telehealth: Payer: Self-pay | Admitting: Neurology

## 2023-06-03 ENCOUNTER — Encounter (HOSPITAL_COMMUNITY)
Admission: RE | Admit: 2023-06-03 | Discharge: 2023-06-03 | Disposition: A | Discharge status: Home / Self Care | Payer: Medicare PPO | Source: Ambulatory Visit | Attending: Neurology | Admitting: Neurology

## 2023-06-03 ENCOUNTER — Encounter: Payer: Self-pay | Admitting: Neurology

## 2023-06-03 DIAGNOSIS — F09 Unspecified mental disorder due to known physiological condition: Secondary | ICD-10-CM

## 2023-06-03 DIAGNOSIS — R413 Other amnesia: Secondary | ICD-10-CM | POA: Insufficient documentation

## 2023-06-03 DIAGNOSIS — G301 Alzheimer's disease with late onset: Secondary | ICD-10-CM | POA: Diagnosis not present

## 2023-06-03 DIAGNOSIS — R4189 Other symptoms and signs involving cognitive functions and awareness: Secondary | ICD-10-CM

## 2023-06-03 DIAGNOSIS — F0280 Dementia in other diseases classified elsewhere without behavioral disturbance: Secondary | ICD-10-CM | POA: Diagnosis not present

## 2023-06-03 DIAGNOSIS — G3184 Mild cognitive impairment, so stated: Secondary | ICD-10-CM

## 2023-06-03 DIAGNOSIS — G309 Alzheimer's disease, unspecified: Secondary | ICD-10-CM

## 2023-06-03 MED ORDER — FLORBETAPIR F 18 500-1900 MBQ/ML IV SOLN
8.1900 | Freq: Once | INTRAVENOUS | Status: AC
  Administered 2023-06-03: 8.19 via INTRAVENOUS

## 2023-06-03 NOTE — Telephone Encounter (Signed)
Spoke to husband (checked DPR) made him  aware there are no restrictions for PET scan today

## 2023-06-03 NOTE — Telephone Encounter (Signed)
Pt's husband, Maebel Tegethoff, want to know is there any restrictions for her PET Scan scheduled today at 2:30 pm. Also checking if order for blood work has been done. Would like a call back

## 2023-06-04 NOTE — Addendum Note (Signed)
Addended by: Judi Cong on: 06/04/2023 05:20 PM   Modules accepted: Orders

## 2023-06-05 ENCOUNTER — Other Ambulatory Visit (INDEPENDENT_AMBULATORY_CARE_PROVIDER_SITE_OTHER): Payer: Self-pay

## 2023-06-05 DIAGNOSIS — G309 Alzheimer's disease, unspecified: Secondary | ICD-10-CM | POA: Diagnosis not present

## 2023-06-05 DIAGNOSIS — G3184 Mild cognitive impairment, so stated: Secondary | ICD-10-CM

## 2023-06-05 DIAGNOSIS — R4189 Other symptoms and signs involving cognitive functions and awareness: Secondary | ICD-10-CM

## 2023-06-05 DIAGNOSIS — G301 Alzheimer's disease with late onset: Secondary | ICD-10-CM | POA: Diagnosis not present

## 2023-06-05 DIAGNOSIS — F09 Unspecified mental disorder due to known physiological condition: Secondary | ICD-10-CM

## 2023-06-05 DIAGNOSIS — R413 Other amnesia: Secondary | ICD-10-CM | POA: Diagnosis not present

## 2023-06-05 DIAGNOSIS — Z0289 Encounter for other administrative examinations: Secondary | ICD-10-CM

## 2023-06-05 DIAGNOSIS — F0280 Dementia in other diseases classified elsewhere without behavioral disturbance: Secondary | ICD-10-CM | POA: Diagnosis not present

## 2023-06-08 NOTE — Telephone Encounter (Signed)
We can discuss the results and consequences of the testing  in face to face visit better, unless patient wants to have a virtual visit.

## 2023-06-09 ENCOUNTER — Ambulatory Visit: Payer: Medicare PPO | Admitting: Internal Medicine

## 2023-06-09 ENCOUNTER — Ambulatory Visit: Payer: Self-pay | Admitting: Internal Medicine

## 2023-06-09 VITALS — BP 124/80 | Ht 68.0 in | Wt 134.0 lb

## 2023-06-09 DIAGNOSIS — M549 Dorsalgia, unspecified: Secondary | ICD-10-CM

## 2023-06-09 DIAGNOSIS — E559 Vitamin D deficiency, unspecified: Secondary | ICD-10-CM | POA: Diagnosis not present

## 2023-06-09 DIAGNOSIS — L309 Dermatitis, unspecified: Secondary | ICD-10-CM

## 2023-06-09 DIAGNOSIS — M542 Cervicalgia: Secondary | ICD-10-CM | POA: Diagnosis not present

## 2023-06-09 DIAGNOSIS — M25512 Pain in left shoulder: Secondary | ICD-10-CM | POA: Diagnosis not present

## 2023-06-09 MED ORDER — MOMETASONE FUROATE 0.1 % EX CREA: TOPICAL_CREAM | CUTANEOUS | 1 refills | Status: DC

## 2023-06-09 MED ORDER — TRAMADOL HCL 50 MG PO TABS: 50.0000 mg | ORAL_TABLET | Freq: Four times a day (QID) | ORAL | 0 refills | Status: AC | PRN

## 2023-06-09 MED ORDER — CYCLOBENZAPRINE HCL 5 MG PO TABS: 5.0000 mg | ORAL_TABLET | Freq: Three times a day (TID) | ORAL | 1 refills | Status: DC | PRN

## 2023-06-09 NOTE — Patient Instructions (Signed)
Please take all new medication as prescribed - the muscle relaxer as needed, and the tramadol as needed for pain, and the new cream for the hands  Please continue all other medications as before, and refills have been done if requested.  Please have the pharmacy call with any other refills you may need.  Please keep your appointments with your specialists as you may have planned

## 2023-06-09 NOTE — Telephone Encounter (Signed)
  Chief Complaint: Neck pain Symptoms: Neck pain Frequency: Constant Pertinent Negatives: Patient denies Numbness/weakness, loss of bladder/bowel control, headache, immobility of neck, loss of neck control, neck swelling. Disposition: [] ED /[] Urgent Care (no appt availability in office) / [x] Appointment(In office/virtual)/ []  [facility]/ [] Home Care/ [] Refused Recommended Disposition /[]  Mobile Bus/ []  Follow-up with PCP Additional Notes: Patient called with complaints of neck pain x2days. Patient states cause of pain is unknown, with no previous history. Patient denies neck kinking, overuse, numbness/tingling/weakness, loss of bladder/bowel control, swelling, or radiation of pain from neck. Patients states that she has attempted to use tylenol, heat, and ice with no relief. Patient advised by this RN to be seen within next 4 hours per protocol, to which patient was agreeable. Patient advised by this RN to call back with worsening symptoms. Patient verbalized understanding.   Copied from CRM [id number]. Topic: Clinical - Red Word Triage >> Jun 09, 2023 10:53 AM Irine Seal wrote: [facility] that prompted transfer to Nurse Triage: severe pain in the left side of her neck, going on 2 days now. Progressively getting worse Reason for Disposition  [1] SEVERE neck pain (e.g., excruciating, unable to do any normal activities) AND [2] not improved after 2 hours of pain medicine  Answer Assessment - Initial Assessment Questions 1. ONSET: "When did the pain begin?"      Day before yesterday (x2 days) 2. LOCATION: "Where does it hurt?"      Left side of neck 3. PATTERN "Does the pain come and go, or has it been constant since it started?"      Constant 4. SEVERITY: "How bad is the pain?"  (Scale 1-10; or mild, moderate, severe)   - NO PAIN (0): no pain or only slight stiffness    - MILD (1-3): doesn't interfere with normal activities    - MODERATE (4-7): interferes with normal  activities or awakens from sleep    - SEVERE (8-10):  excruciating pain, unable to do any normal activities      "I would say its about a 7 or 8 at least" 5. RADIATION: "Does the pain go anywhere else, shoot into your arms?"     Denies 6. CORD SYMPTOMS: "Any weakness or numbness of the arms or legs?"     Denies 7. CAUSE: "What do you think is causing the neck pain?"     Unsure 8. NECK OVERUSE: "Any recent activities that involved turning or twisting the neck?"     Denies 9. OTHER SYMPTOMS: "Do you have any other symptoms?" (e.g., headache, fever, chest pain, difficulty breathing, neck swelling)     Denies  Protocols used: Neck Pain or Stiffness-A-AH

## 2023-06-10 ENCOUNTER — Ambulatory Visit
Admission: RE | Admit: 2023-06-10 | Discharge: 2023-06-10 | Disposition: A | Discharge status: Home / Self Care | Payer: Medicare PPO | Source: Ambulatory Visit | Attending: Internal Medicine | Admitting: Internal Medicine

## 2023-06-10 DIAGNOSIS — Z780 Asymptomatic menopausal state: Secondary | ICD-10-CM

## 2023-06-10 DIAGNOSIS — N958 Other specified menopausal and perimenopausal disorders: Secondary | ICD-10-CM | POA: Diagnosis not present

## 2023-06-10 DIAGNOSIS — Z0000 Encounter for general adult medical examination without abnormal findings: Secondary | ICD-10-CM

## 2023-06-10 DIAGNOSIS — M8588 Other specified disorders of bone density and structure, other site: Secondary | ICD-10-CM | POA: Diagnosis not present

## 2023-06-10 DIAGNOSIS — E2839 Other primary ovarian failure: Secondary | ICD-10-CM | POA: Diagnosis not present

## 2023-06-11 ENCOUNTER — Ambulatory Visit: Payer: Medicare PPO | Admitting: Podiatry

## 2023-06-11 ENCOUNTER — Encounter: Payer: Self-pay | Admitting: Podiatry

## 2023-06-11 DIAGNOSIS — L245 Irritant contact dermatitis due to other chemical products: Secondary | ICD-10-CM

## 2023-06-11 DIAGNOSIS — M79675 Pain in left toe(s): Secondary | ICD-10-CM

## 2023-06-11 DIAGNOSIS — B351 Tinea unguium: Secondary | ICD-10-CM | POA: Diagnosis not present

## 2023-06-11 DIAGNOSIS — M79674 Pain in right toe(s): Secondary | ICD-10-CM | POA: Diagnosis not present

## 2023-06-11 MED ORDER — CLOTRIMAZOLE-BETAMETHASONE 1-0.05 % EX CREA: 1.0000 | TOPICAL_CREAM | Freq: Every day | CUTANEOUS | 0 refills | Status: DC

## 2023-06-11 NOTE — Progress Notes (Signed)
Subjective:   Patient ID: Wendy Serrano, female   DOB: 78 y.o.   MRN: [id number]   HPI Patient presents with caregiver with indications of dermatitis possible chemical reaction possible fungal infection of both feet that we had tried oral Lamisil   ROS      Objective:  Physical Exam  Patterns on the ankle bilateral and plantar arch that have scales on them no blistering currently no itching currently     Assessment:  Possibility for dermatitis versus possibility for reaction versus possibility for fungal infection     Plan:  H&P reviewed at great length and I do not have good answers for this as she did not respond to oral antifungal therapy organ to try her on Lotrisone to see if that will make a difference and I fed reviewed with her to see the dermatologist and they had numerous questions which I attempted answered to the best of my ability

## 2023-06-12 LAB — APOE ALZHEIMER'S RISK

## 2023-06-13 ENCOUNTER — Encounter: Payer: Self-pay | Admitting: Internal Medicine

## 2023-06-13 NOTE — Assessment & Plan Note (Signed)
Last vitamin D Lab Results  Component Value Date   VD25OH 34.64 06/26/2021   Low, to start oral replacement

## 2023-06-13 NOTE — Progress Notes (Signed)
Patient ID: Wendy Serrano, female   DOB: 1946-04-28, 78 y.o.   MRN: [id number]        Chief Complaint: follow up left upper back pain, hand dermatitis, low vit d,         HPI:  Wendy Serrano is a 78 y.o. female here with c/o 3 days onset left upper back pain with some radiaition to the left post neck but not shoulder and no clear radicular pain; no recent heavy lifting. Ice helps temporarrily, worse to raise the arm over the head but no shoulder pain.  Also has worsening hand dermatitis, has seen dermatology with betamethasone cr prn but no longer seems to be working well.  Pt denies chest pain, increased sob or doe, wheezing, orthopnea, PND, increased LE swelling, palpitations, dizziness or syncope.   Pt denies polydipsia, polyuria, or new focal neuro s/s.    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   Menopausal symptoms    Renal cyst    bilateral   Rotator cuff tear    Yeast infection    Past Surgical History:  Procedure Laterality Date   ROTATOR CUFF REPAIR Left 08/2009   Left shoulder-rotator cuff repair [name])   WISDOM TOOTH EXTRACTION      reports that she has never smoked. She has never used smokeless tobacco. She reports that she does not  drink alcohol and does not use drugs. family history includes Diabetes in her mother; Hyperlipidemia in her sister; Pancreatic cancer in her mother; Prostate cancer in her father. No Known Allergies Current Outpatient Medications on File Prior to Visit  Medication Sig Dispense Refill   aspirin 81 MG chewable tablet Chew 81 mg by mouth daily.     b complex vitamins tablet Take 1 tablet by mouth daily. 100 tablet 3   FOLIC ACID PO Take 4 tablets by mouth daily.     glucosamine-chondroitin 500-400 MG tablet Take 1 tablet by mouth 2 (two) times daily.     Lecanemab-irmb (LEQEMBI) 200 MG/2ML SOLN IV therapy to start after PT scan, 2 mL 6   Magnesium 400 MG TABS Take 1 tablet by mouth daily.     memantine (NAMENDA) 10 MG tablet Take 1 tablet (10 mg total) by mouth 2 (two) times daily.     methotrexate (RHEUMATREX) 2.5 MG tablet 6 tablets on Saturday  3   prednisoLONE acetate (PRED FORTE) 1 % ophthalmic suspension Place 1 drop into both eyes daily.     pregabalin (LYRICA) 25 MG capsule TAKE 1 CAPSULE BY MOUTH 2 TIMES DAILY. 60  capsule 5   rivastigmine (EXELON) 4.6 mg/24hr Place 1 patch (4.6 mg total) onto the skin daily. 90 patch 3   SIMBRINZA 1-0.2 % SUSP Place 1 drop into both eyes 2 (two) times daily.  6   tacrolimus (PROTOPIC) 0.1 % ointment Apply topically 2 (two) times daily. Apply 2 x daily to palms for 2 weeks then stop. Alternate with Clotrimazole/Betamethasone cream 100 g 2   UNABLE TO FIND Take 2 capsules by mouth daily. Med Name: Lions mane     UNABLE TO FIND Take 2 capsules by mouth daily. Med Name: Kristen Cardinal- for cholesterol     No current facility-administered medications on file prior to visit.        ROS:  All others reviewed and negative.  Objective        PE:  BP 124/80 (BP Location: Left Arm, Patient Position: Sitting, Cuff Size: Normal)   Ht 5\' 8"  (1.727 m)   Wt 134 lb (60.8 kg)   SpO2 96%   BMI 20.37 kg/m                 Constitutional: Pt appears in NAD                HENT: Head: NCAT.                Right Ear: External ear normal.                 Left Ear: External ear normal.                Eyes: . Pupils are equal, round, and reactive to light. Conjunctivae and EOM are normal               Nose: without d/c or deformity               Neck: Neck supple. Gross normal ROM               Cardiovascular: Normal rate and regular rhythm.                 Pulmonary/Chest: Effort normal and breath sounds without rales or wheezing.                Abd:  Soft, NT, ND, + BS, no organomegaly               Neurological: Pt is alert. At baseline orientation, motor grossly intact               Skin: Skin is warm. Bilat hands with non tender scaly erythema.,  no other new lesions, LE edema - none               Psychiatric: Pt behavior is normal without agitation   Micro: none  Cardiac tracings I have personally interpreted today:  none  Pertinent Radiological findings (summarize): none   Lab Results  Component Value Date   WBC 6.2 01/07/2023   HGB 13.2 01/07/2023   HCT 40.1 01/07/2023   PLT 265.0 01/07/2023   GLUCOSE 91 01/07/2023   CHOL 208 (H) 08/22/2020   TRIG 51.0 08/22/2020   HDL 86.40 08/22/2020   LDLDIRECT 127.9 03/09/2013   LDLCALC 112 (H) 08/22/2020   ALT 26 01/07/2023   AST 31 01/07/2023   NA 141 01/07/2023   K 4.0 01/07/2023   CL 105 01/07/2023   CREATININE 0.81 01/07/2023   BUN 16 01/07/2023   CO2 30 01/07/2023   TSH 3.69 01/07/2023  INR 1.1 (H) 05/16/2015   HGBA1C 5.4 02/26/2011   Assessment/Plan:  Wendy Serrano is a 78 y.o. Black or African American [2] female with  has a past medical history of Allergic rhinitis, Atrophy of vagina (07/15/10), C. difficile diarrhea (06/2017), Cervicogenic headache (09/21/2019), Eczema, H/O osteopenia (01/09/04), H/O osteoporosis (2008), H/O varicella, Herpes simplex without mention of complication, History of measles, mumps, or rubella, Leukopenia, Liver hemangioma, Macular degeneration (oct '11),  Menopausal symptoms, Renal cyst, Rotator cuff tear, and Yeast infection.  Hand dermatitis Mild to mod, for trial elocon cr prn,,  to f/u any worsening symptoms or concerns  Upper back pain on left side Etiology unclear, but c/w msk strain  - for tramadol prn, and flexeril prn,  to f/u any worsening symptoms or concerns  Vitamin D deficiency Last vitamin D Lab Results  Component Value Date   VD25OH 34.64 06/26/2021   Low, to start oral replacement  Followup: Return if symptoms worsen or fail to improve.  Oliver Barre, MD 06/13/2023 10:32 AM [facility]

## 2023-06-13 NOTE — Assessment & Plan Note (Signed)
Mild to mod, for trial elocon cr prn,,  to f/u any worsening symptoms or concerns

## 2023-06-13 NOTE — Assessment & Plan Note (Signed)
Etiology unclear, but c/w msk strain  - for tramadol prn, and flexeril prn,  to f/u any worsening symptoms or concerns

## 2023-06-15 ENCOUNTER — Ambulatory Visit: Payer: Medicare PPO | Admitting: Internal Medicine

## 2023-06-17 ENCOUNTER — Encounter: Payer: Self-pay | Admitting: Internal Medicine

## 2023-06-22 ENCOUNTER — Encounter: Payer: Self-pay | Admitting: Internal Medicine

## 2023-06-22 ENCOUNTER — Ambulatory Visit: Payer: Medicare PPO | Admitting: Internal Medicine

## 2023-06-22 VITALS — BP 134/80 | HR 79 | Temp 98.2°F | Ht 68.0 in | Wt 135.5 lb

## 2023-06-22 DIAGNOSIS — E559 Vitamin D deficiency, unspecified: Secondary | ICD-10-CM | POA: Diagnosis not present

## 2023-06-22 DIAGNOSIS — M810 Age-related osteoporosis without current pathological fracture: Secondary | ICD-10-CM

## 2023-06-22 DIAGNOSIS — M542 Cervicalgia: Secondary | ICD-10-CM | POA: Diagnosis not present

## 2023-06-22 DIAGNOSIS — I700 Atherosclerosis of aorta: Secondary | ICD-10-CM

## 2023-06-22 NOTE — Patient Instructions (Signed)

## 2023-06-22 NOTE — Assessment & Plan Note (Signed)
 Take vitamin D3 and K2

## 2023-06-22 NOTE — Assessment & Plan Note (Signed)
 Take vitamin D3 and K2 Weightbearing exercise Evenity  versus Prolia discussed.  Will run through health insurance to determine coverage

## 2023-06-22 NOTE — Assessment & Plan Note (Signed)
 Left trap pain Use RICE heating pad Blue-Emu cream was recommended to use 2-3 times a day Neck pillow

## 2023-06-22 NOTE — Assessment & Plan Note (Signed)
 Fish oil .  Diet and exercise

## 2023-06-22 NOTE — Progress Notes (Signed)
 Subjective:  Patient ID: Wendy Serrano, female    DOB: 1945-06-30  Age: 77 y.o. MRN: [id number]  CC: Arteriosclerosis (Was recently diagnosis with arteriosclerosis ( wants to talk about it). Pain in the hand and neck. Some break on the had ( not sure what the cause is). )   HPI Wendy Serrano presents for L upper back pain. Pt saw Dr Autry Legions, Mr Kurt Phi Follow-up on abnormal bone density scan test It one has not started Alzheimer's infusion treatment yet  She is here with her husband [name]  Outpatient Medications Prior to Visit  Medication Sig Dispense Refill   aspirin 81 MG chewable tablet Chew 81 mg by mouth daily.     clotrimazole -betamethasone  (LOTRISONE ) cream Apply 1 Application topically daily. 30 g 0   cyclobenzaprine  (FLEXERIL ) 5 MG tablet Take 1 tablet (5 mg total) by mouth 3 (three) times daily as needed. 40 tablet 1   FOLIC ACID PO Take 4 tablets by mouth daily.     glucosamine-chondroitin 500-400 MG tablet Take 1 tablet by mouth 2 (two) times daily.     Lecanemab -irmb (LEQEMBI ) 200 MG/2ML SOLN IV therapy to start after PT scan, 2 mL 6   Magnesium 400 MG TABS Take 1 tablet by mouth daily.     memantine  (NAMENDA ) 10 MG tablet Take 1 tablet (10 mg total) by mouth 2 (two) times daily.     methotrexate (RHEUMATREX) 2.5 MG tablet 6 tablets on Saturday  3   mometasone  (ELOCON ) 0.1 % cream Use as directed to affected area twice per day as needed 45 g 1   prednisoLONE acetate (PRED FORTE) 1 % ophthalmic suspension Place 1 drop into both eyes daily.     pregabalin  (LYRICA ) 25 MG capsule TAKE 1 CAPSULE BY MOUTH 2 TIMES DAILY. 60 capsule 5   rivastigmine  (EXELON ) 4.6 mg/24hr Place 1 patch (4.6 mg total) onto the skin daily. 90 patch 3   SIMBRINZA 1-0.2 % SUSP Place 1 drop into both eyes 2 (two) times daily.  6   tacrolimus  (PROTOPIC ) 0.1 % ointment Apply topically 2 (two) times daily. Apply 2 x daily to palms for 2 weeks then stop. Alternate with Clotrimazole /Betamethasone  cream 100 g  2   traMADol  (ULTRAM ) 50 MG tablet Take 1 tablet (50 mg total) by mouth every 6 (six) hours as needed. 30 tablet 0   UNABLE TO FIND Take 2 capsules by mouth daily. Med Name: Lions mane     UNABLE TO FIND Take 2 capsules by mouth daily. Med Name: Kyolic- for cholesterol     Vitamin D -Vitamin K (VITAMIN K2-VITAMIN D3 PO) Take by mouth.     b complex vitamins tablet Take 1 tablet by mouth daily. (Patient taking differently: Take 1 tablet by mouth daily. Pt is taking Vitamin B+K) 100 tablet 3   No facility-administered medications prior to visit.    ROS: Review of Systems  Constitutional:  Positive for fatigue. Negative for activity change, appetite change, chills and unexpected weight change.  HENT:  Negative for congestion, mouth sores and sinus pressure.   Eyes:  Negative for visual disturbance.  Respiratory:  Negative for cough and chest tightness.   Gastrointestinal:  Negative for abdominal pain and nausea.  Genitourinary:  Negative for difficulty urinating, frequency and vaginal pain.  Musculoskeletal:  Positive for arthralgias, back pain, neck pain and neck stiffness. Negative for gait problem.  Skin:  Negative for pallor and rash.  Neurological:  Positive for headaches. Negative for dizziness, tremors, weakness and numbness.  Psychiatric/Behavioral:  Positive for decreased concentration. Negative for confusion and sleep disturbance. The patient is nervous/anxious.     Objective:  BP 134/80   Pulse 79   Temp 98.2 F (36.8 C) (Temporal)   Ht 5\' 8"  (1.727 m)   Wt 135 lb 8 oz (61.5 kg)   SpO2 97%   BMI 20.60 kg/m   BP Readings from Last 3 Encounters:  06/22/23 134/80  06/09/23 124/80  05/11/23 138/64    Wt Readings from Last 3 Encounters:  06/22/23 135 lb 8 oz (61.5 kg)  06/09/23 134 lb (60.8 kg)  05/11/23 135 lb (61.2 kg)    Physical Exam Constitutional:      General: She is not in acute distress.    Appearance: She is well-developed.  HENT:     Head:  Normocephalic.     Right Ear: External ear normal.     Left Ear: External ear normal.     Nose: Nose normal.  Eyes:     General:        Right eye: No discharge.        Left eye: No discharge.     Conjunctiva/sclera: Conjunctivae normal.     Pupils: Pupils are equal, round, and reactive to light.  Neck:     Thyroid : No thyromegaly.     Vascular: No JVD.     Trachea: No tracheal deviation.  Cardiovascular:     Rate and Rhythm: Normal rate and regular rhythm.     Heart sounds: Normal heart sounds.  Pulmonary:     Effort: No respiratory distress.     Breath sounds: No stridor. No wheezing.  Abdominal:     General: Bowel sounds are normal. There is no distension.     Palpations: Abdomen is soft. There is no mass.     Tenderness: There is no abdominal tenderness. There is no guarding or rebound.  Musculoskeletal:        General: No tenderness.     Cervical back: Normal range of motion and neck supple. No rigidity.  Lymphadenopathy:     Cervical: No cervical adenopathy.  Skin:    Findings: No erythema or rash.  Neurological:     Mental Status: Mental status is at baseline.     Cranial Nerves: No cranial nerve deficit.     Motor: No abnormal muscle tone.     Coordination: Coordination normal.     Deep Tendon Reflexes: Reflexes normal.  Psychiatric:        Behavior: Behavior normal.        Thought Content: Thought content normal.        Judgment: Judgment normal.   Neck pain in the area of the left trap  Lab Results  Component Value Date   WBC 6.2 01/07/2023   HGB 13.2 01/07/2023   HCT 40.1 01/07/2023   PLT 265.0 01/07/2023   GLUCOSE 91 01/07/2023   CHOL 208 (H) 08/22/2020   TRIG 51.0 08/22/2020   HDL 86.40 08/22/2020   LDLDIRECT 127.9 03/09/2013   LDLCALC 112 (H) 08/22/2020   ALT 26 01/07/2023   AST 31 01/07/2023   NA 141 01/07/2023   K 4.0 01/07/2023   CL 105 01/07/2023   CREATININE 0.81 01/07/2023   BUN 16 01/07/2023   CO2 30 01/07/2023   TSH 3.69 01/07/2023    INR 1.1 (H) 05/16/2015   HGBA1C 5.4 02/26/2011    DG Bone Density Result Date: 06/10/2023 EXAM: DUAL X-RAY ABSORPTIOMETRY (DXA) FOR BONE MINERAL DENSITY IMPRESSION:  Referring Physician:  Genia Kettering Your patient completed a bone mineral density test using GE Lunar iDXA system (analysis version: 16). Technologist: BEC PATIENT: Name: Wendy Serrano, Wendy Serrano Patient ID: [id number] Birth Date: 06-27-1945 Height: 66.0 in. Sex: Female Measured: 06/10/2023 Weight: 134.4 lbs. Indications: Advanced Age, Estrogen Deficient, Flexeril , Height Loss (781.91), High risk medication use, Lyrica , Postmenopausal, Prednisolone, Vitamin D  Deficient Fractures: NONE Treatments: Vitamin D  (E933.5) ASSESSMENT: The BMD measured at Femur Neck Left is 0.626 g/cm2 with a T-score of -3.0. This patient's diagnostic category is OSTEOPOROSIS according to World Health Organization [facility]) criteria. L3 & L4 were excluded due to degenerative changes. The quality of the exam is good. Site Region Measured Date Measured Age YA BMD Significant CHANGE T-score DualFemur Neck Left  5/10[age]2022    78.6         -3.0    0.626 g/cm2 AP Spine  L1-L2      5/10/2022    78.7         -1.9    0.936 g/cm2 DualFemur Total Mean 5/10[age]2023    78.7         -2.8    0.655 g/cm2 World Health Organization [facility]) criteria for post-menopausal, Caucasian Women: Normal       T-score at or above -1 SD Osteopenia   T-score between -1 and -2.5 SD Osteoporosis T-score at or below -2.5 SD RECOMMENDATION: 1. All patients should optimize calcium and vitamin D  intake. 2. Consider FDA-approved medical therapies in postmenopausal women and men aged 14 years and older, based on the following: a. A hip or vertebral (clinical or morphometric) fracture. b. T-score = -2.5 at the femoral neck or spine after appropriate evaluation to exclude secondary causes. c. Low bone mass (T-score between -1.0 and -2.5 at the femoral neck or spine) and a 10-year probability of a hip fracture = 3% or a  10-year probability of a major osteoporosis-related fracture = 20% based on the [place] -adapted WHO algorithm. d. Clinician judgment and/or patient preferences may indicate treatment for people with 10-year fracture probabilities above or below these levels. FOLLOW-UP: Patients with diagnosis of osteoporosis or at high risk for fracture should have regular bone mineral density tests.? Patients eligible for Medicare are allowed routine testing every 2 years.? The testing frequency can be increased to one year for patients who have rapidly progressing disease, are receiving or discontinuing medical therapy to restore bone mass, or have additional risk factors. I have reviewed this study and agree with the findings. [facility] Electronically Signed   By: Sundra Engel M.D.   On: 06/10/2023 12:07    Assessment & Plan:   Problem List Items Addressed This Visit     Osteoporosis   Take vitamin D3 and K2 Weightbearing exercise Evenity  versus Prolia discussed.  Will run through health insurance to determine coverage      Neck pain - Primary   Left trap pain Use RICE heating pad Blue-Emu cream was recommended to use 2-3 times a day Neck pillow      Atherosclerosis of aorta (HCC)   Fish oil .  Diet and exercise      Vitamin D  deficiency   Take vitamin D3 and K2         No orders of the defined types were placed in this encounter.     Follow-up: Return in about 3 months (around 09/19/2023) for a follow-up visit.  Anitra Barn, MD

## 2023-06-23 ENCOUNTER — Telehealth: Payer: Self-pay

## 2023-06-23 NOTE — Telephone Encounter (Signed)
PA is needed for Evenity.  Dx is Osteoporosis, unspecified osteoporosis type, unspecified pathological fracture presence.

## 2023-06-23 NOTE — Telephone Encounter (Signed)
Evenity VOB initiated via [email or web address]

## 2023-06-27 ENCOUNTER — Other Ambulatory Visit: Payer: Self-pay | Admitting: Podiatry

## 2023-06-29 ENCOUNTER — Other Ambulatory Visit (HOSPITAL_COMMUNITY): Payer: Self-pay

## 2023-06-29 NOTE — Telephone Encounter (Signed)
 Pharmacy Patient Advocate Encounter   Received notification from  [employer] Portal that prior authorization for Evenity is required/requested.   Insurance verification completed.   The patient is insured through [place] .   Per test claim: PA required; PA submitted to above mentioned insurance via CoverMyMeds Key/confirmation #/EOC [employer] Status is pending

## 2023-06-29 NOTE — Telephone Encounter (Signed)
 [name]

## 2023-06-30 ENCOUNTER — Other Ambulatory Visit (HOSPITAL_COMMUNITY): Payer: Self-pay

## 2023-06-30 NOTE — Telephone Encounter (Signed)
 Pt ready for scheduling for Evenity on or after : 06/30/23  Out-of-pocket cost due at time of visit: $40  Number of injection/visits approved: --  Primary: [employer] - Medicare Evenity co-insurance: $40 Admin fee co-insurance: --  Secondary: N/A Evenity co-insurance:  Admin fee co-insurance:   Medical Benefit Details: Date Benefits were checked: 06/23/23 Deductible: no/ Coinsurance: $40/ Admin Fee: --  Prior Auth: approved  PA# [id number] Expiration Date: 06/29/23 to 05/11/24 # of doses approved:  Pharmacy benefit: Copay $-- If patient wants fill through the pharmacy benefit please send prescription to: --, and include estimated need by date in rx notes. Pharmacy will ship medication directly to the office.  Patient not eligible for Evenity Copay Card. Copay Card can make patient's cost as little as $25. Link to apply: [email or web address]://www.[email or web address].[email or web address]/copay  ** This summary of benefits is an estimation of the patient's out-of-pocket cost. Exact cost may very based on individual plan coverage.

## 2023-06-30 NOTE — Telephone Encounter (Signed)
 Pharmacy Patient Advocate Encounter  Received notification from [facility] that Prior Authorization for Sheilah Mins has been APPROVED from 06/29/23 to 05/11/24   PA #/Case ID/Reference #: [id number]

## 2023-07-01 ENCOUNTER — Encounter: Payer: Self-pay | Admitting: Neurology

## 2023-07-01 ENCOUNTER — Telehealth: Payer: Medicare PPO | Admitting: Neurology

## 2023-07-01 DIAGNOSIS — F0280 Dementia in other diseases classified elsewhere without behavioral disturbance: Secondary | ICD-10-CM | POA: Diagnosis not present

## 2023-07-01 DIAGNOSIS — G301 Alzheimer's disease with late onset: Secondary | ICD-10-CM | POA: Diagnosis not present

## 2023-07-01 NOTE — Progress Notes (Addendum)
 Virtual Visit via Video Note  I connected with Wendy Serrano on 07/01/23 at  9:00 AM EST by a video enabled telemedicine application and verified that I am speaking with the correct person using two identifiers.  Location: Patient:  with her husband at her home  Provider: at her home    I discussed the limitations of evaluation and management by telemedicine and the availability of in person appointments. The patient expressed understanding and agreed to proceed.  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  The differential dx of Alzheimer's Disease was made by presence of Bio-Markers , amyloid 42/ 40 and P Tau protein in a blood test, and confirmed by PET scan.   Associated symptoms: She had developed some personality changes , appears more often frazzled, overwhelmed.  She is also less actively participating during out visits, speaks less and repeats questions.  MMSE 23/ 30 on 05-11-2023. Amyloid PET scan confirmed the lab ATN results.   We had a face to face meeting on 05/11/2023 to discuss  Brain MRI results , no changes over MRI 1 -2022, and she could be a candidate for MAB therapy. Not a vascular dementia. No evidence of micro hemorrhages. .  Chief concern according to patient :  "I want to know if I am a candidate for the MAB therapy. "    Dr Janee Morn stated his wife has begun to follow him, walking behind him when they go shopping. She is writing a shopping list , but ventures off. She needs reminders, but is able to dress and feed herself. She is well groomed.    Mini-mental status exam was not given today, for unknown reasons the  patient had a MOCA test , was scored at 11/ 24.  The MMSE  was then performed through me, she scored 25/ 30. When using WORLD spelling instead of serial 7. Otherwise 23/ 30    She has good and bad days, short term memory is an ongoing issue. We are discussing Leqembi today,  the side effects, ( micro-bleeds to the brain, brain atrophy- and direct effects of possible chills with the first 2 infusions)  the duration of therapy  ( 18 months now ) and the new recommendation of maintenance therapy ( once a month indefinitely).  I see no problem of interaction with the osteoporosis treatment that dr Posey Rea wants to start .      03/24/2023   12:07 PM 03/13/2022   10:41 AM 09/11/2021    2:34 PM 04/29/2021   10:21 AM 11/01/2020   10:04 AM 05/03/2020   10:00 AM 11/09/2018   10:35 AM  MMSE - Mini Mental State Exam  Orientation to time 3 5 5 4 4 5 4   Orientation to Place 5 5 5 5 5 5 5   Registration 3 3 3 3 3 3 3   Attention/ Calculation 4 2 5 2 1 5 5   Recall 0 2 1 1 1 2 1   Language- name 2 objects 2 2 2 2 2 2 2   Language- repeat 1 1 0 0 1 1 1   Language- follow 3 step command 3 2 3 3 3 3 3   Language-  read & follow direction 1 1 1 1 1 1 1   Write a sentence 1 1 1 1 1 1 1   Copy design 0 1 0 0 1 1 0  Total score 23 25 26 22 23 29 26         03/24/2023   10:47 AM 08/12/2022    8:20 AM  Montreal Cognitive Assessment   Visuospatial/ Executive (0/5) 0 1  Naming (0/3) 2 3  Attention: Read list of digits (0/2) 1 2  Attention: Read list of letters (0/1) 1 1  Attention: Serial 7 subtraction starting at 100 (0/3) 1 1  Language: Repeat phrase (0/2) 1 1  Language : Fluency (0/1) 0 0  Abstraction (0/2) 2 2  Delayed Recall (0/5) 0 0  Orientation (0/6) 3 3  Total 11 14        Observations/Objective: fluent discussion, patient  does feel overwhelmed by the information, and is concerned about the infusion therapy logistics itself.  Functional assessesment score 05-19-2023 was 2 points.  APO E Genotyping  Result: Comment  Comment: E3/E4 (one copy of the APOE4 variant)  APOE4 is common, with 25% of the general population having  one copy and 1% having two copies of this variant. Among  patients with late onset AD, 50-70% are positive for APOE4.   Component Ref Range & Units (hover) 10 mo ago  A -- Beta-amyloid 42/40 Ratio 0.101 Low   Beta-amyloid 42 16.83  Beta-amyloid 40 166.52  T -- p-[id number] 2.07 High   N -- NfL, Plasma 4.17     Assessment and Plan: The diagnosis is neurocognitive impairment due to late onset Alzheimer's disease, progressive decline into dementia ;  We will go ahead and start Leqembi - cost estimate will be evident once the infusion center has checked the insurance  coverage for this medication.     Follow Up Instructions: we will call to set up infusion.     I discussed the assessment and treatment plan with the patient. The patient was provided an opportunity to ask questions and all were answered. The patient agreed with the plan and demonstrated an understanding of the instructions.   The patient was advised to call back or seek an in-person evaluation if the symptoms worsen or if the condition fails to improve as anticipated.  I provided 22 minutes of non-face-to-face time during this encounter.   Melvyn Novas, MD

## 2023-07-01 NOTE — Patient Instructions (Signed)
 Starting [employer] . Information about this drug had been attached to previous visit.

## 2023-07-02 ENCOUNTER — Telehealth: Payer: Self-pay | Admitting: Internal Medicine

## 2023-07-02 NOTE — Telephone Encounter (Signed)
 Called and spoke with patient and have since scheduled them for their evenity injection.

## 2023-07-02 NOTE — Telephone Encounter (Signed)
 Patient spouse called back in stated  that patient is able to take the injection [place] injection

## 2023-07-02 NOTE — Telephone Encounter (Signed)
 Please start monthly injections for 12 months.  Thanks

## 2023-07-02 NOTE — Telephone Encounter (Signed)
 Patient is cleared for EVENITY injection after 06/29/23 per PA information on 06/29/2023.  Mentioned in provider note last on 06/22/2023. Medication is CLINIC supplied. CoPay:$40  Patient cleared to schedule for [facility]

## 2023-07-04 DIAGNOSIS — M25512 Pain in left shoulder: Secondary | ICD-10-CM | POA: Diagnosis not present

## 2023-07-04 DIAGNOSIS — M542 Cervicalgia: Secondary | ICD-10-CM | POA: Diagnosis not present

## 2023-07-04 DIAGNOSIS — M5412 Radiculopathy, cervical region: Secondary | ICD-10-CM | POA: Insufficient documentation

## 2023-07-06 ENCOUNTER — Telehealth: Payer: Self-pay | Admitting: Neurology

## 2023-07-06 DIAGNOSIS — G301 Alzheimer's disease with late onset: Secondary | ICD-10-CM

## 2023-07-06 NOTE — Telephone Encounter (Signed)
 Leqembi order completed and paperwork provided to infusion suite to process.

## 2023-07-06 NOTE — Telephone Encounter (Signed)
 Patient is cleared for EVENITY injection on 07/09/2023 per PA information on 06/30/2023.  Mentioned in provider note last on 06/22/2023. Medication is CLINIC supplied. CoPay:$40

## 2023-07-08 DIAGNOSIS — B353 Tinea pedis: Secondary | ICD-10-CM | POA: Diagnosis not present

## 2023-07-08 DIAGNOSIS — L2089 Other atopic dermatitis: Secondary | ICD-10-CM | POA: Diagnosis not present

## 2023-07-08 DIAGNOSIS — L308 Other specified dermatitis: Secondary | ICD-10-CM | POA: Diagnosis not present

## 2023-07-09 ENCOUNTER — Ambulatory Visit: Payer: Medicare PPO

## 2023-07-09 ENCOUNTER — Ambulatory Visit (INDEPENDENT_AMBULATORY_CARE_PROVIDER_SITE_OTHER): Payer: Self-pay | Admitting: Neurology

## 2023-07-09 ENCOUNTER — Other Ambulatory Visit: Payer: Self-pay

## 2023-07-09 DIAGNOSIS — M810 Age-related osteoporosis without current pathological fracture: Secondary | ICD-10-CM

## 2023-07-09 DIAGNOSIS — Z0289 Encounter for other administrative examinations: Secondary | ICD-10-CM

## 2023-07-09 MED ORDER — ROMOSOZUMAB-AQQG 105 MG/1.17ML ~~LOC~~ SOSY: 210.0000 mg | PREFILLED_SYRINGE | SUBCUTANEOUS | Status: AC

## 2023-07-09 MED ORDER — ROMOSOZUMAB-AQQG 105 MG/1.17ML ~~LOC~~ SOSY
210.0000 mg | PREFILLED_SYRINGE | SUBCUTANEOUS | Status: AC
  Administered 2023-07-09: 210 mg via SUBCUTANEOUS

## 2023-07-09 NOTE — Progress Notes (Signed)
 Pt presented today to the office to complete a updated memory assessment for the infusion. MMSE completed and will be provided to the infusion suite for them to continue with the PA.

## 2023-07-09 NOTE — Progress Notes (Addendum)
 Patient visits today for their evenity injection. Patient informed of what they  had received and tolerated injection well. Patient notified to reach out to office if needed.  Medical screening examination/treatment/procedure(s) were performed by non-physician practitioner and as supervising physician I was immediately available for consultation/collaboration.  I agree with above. Jacinta Shoe, MD

## 2023-07-11 ENCOUNTER — Other Ambulatory Visit: Payer: Self-pay | Admitting: Dermatology

## 2023-07-14 ENCOUNTER — Ambulatory Visit: Payer: Medicare PPO

## 2023-07-16 DIAGNOSIS — H40013 Open angle with borderline findings, low risk, bilateral: Secondary | ICD-10-CM | POA: Diagnosis not present

## 2023-07-22 DIAGNOSIS — M5412 Radiculopathy, cervical region: Secondary | ICD-10-CM | POA: Diagnosis not present

## 2023-07-24 DIAGNOSIS — Z961 Presence of intraocular lens: Secondary | ICD-10-CM | POA: Diagnosis not present

## 2023-07-24 DIAGNOSIS — H209 Unspecified iridocyclitis: Secondary | ICD-10-CM | POA: Diagnosis not present

## 2023-07-24 DIAGNOSIS — Z79899 Other long term (current) drug therapy: Secondary | ICD-10-CM | POA: Diagnosis not present

## 2023-07-24 DIAGNOSIS — H35372 Puckering of macula, left eye: Secondary | ICD-10-CM | POA: Diagnosis not present

## 2023-07-24 DIAGNOSIS — H35351 Cystoid macular degeneration, right eye: Secondary | ICD-10-CM | POA: Diagnosis not present

## 2023-07-24 DIAGNOSIS — F0390 Unspecified dementia without behavioral disturbance: Secondary | ICD-10-CM | POA: Diagnosis not present

## 2023-07-24 DIAGNOSIS — H35363 Drusen (degenerative) of macula, bilateral: Secondary | ICD-10-CM | POA: Diagnosis not present

## 2023-07-24 DIAGNOSIS — H40043 Steroid responder, bilateral: Secondary | ICD-10-CM | POA: Diagnosis not present

## 2023-07-28 ENCOUNTER — Other Ambulatory Visit: Payer: Self-pay | Admitting: Internal Medicine

## 2023-07-28 ENCOUNTER — Other Ambulatory Visit (HOSPITAL_COMMUNITY): Payer: Self-pay

## 2023-08-03 ENCOUNTER — Telehealth: Payer: Self-pay | Admitting: Neurology

## 2023-08-03 NOTE — Telephone Encounter (Signed)
 Spouse asking if pt has to continue taking  donepezil (ARICEPT) 10 MG tablet please call him to discuss.

## 2023-08-03 NOTE — Telephone Encounter (Signed)
 Spoke with Pt husband regarding continuing donepezil of 10 mg once daily while receiving infusion. Advised that Dr Vickey Huger likes for Pt to continue PO medication while on infusion and she should continue. PCP initially prescribed at 1/2 tab (5mg ) but Dr Vickey Huger recommended increasing to full dose of 10 mg at last visit. Pt husband stated understanding and appreciative of the call back.

## 2023-08-04 DIAGNOSIS — L308 Other specified dermatitis: Secondary | ICD-10-CM | POA: Diagnosis not present

## 2023-08-04 DIAGNOSIS — L2089 Other atopic dermatitis: Secondary | ICD-10-CM | POA: Diagnosis not present

## 2023-08-04 DIAGNOSIS — B353 Tinea pedis: Secondary | ICD-10-CM | POA: Diagnosis not present

## 2023-08-06 ENCOUNTER — Encounter: Payer: Self-pay | Admitting: Podiatry

## 2023-08-06 ENCOUNTER — Ambulatory Visit: Admitting: Podiatry

## 2023-08-06 DIAGNOSIS — M79674 Pain in right toe(s): Secondary | ICD-10-CM | POA: Diagnosis not present

## 2023-08-06 DIAGNOSIS — B351 Tinea unguium: Secondary | ICD-10-CM

## 2023-08-06 DIAGNOSIS — M79675 Pain in left toe(s): Secondary | ICD-10-CM

## 2023-08-06 NOTE — Progress Notes (Signed)
 Subjective:   Patient ID: Wendy Serrano, female   DOB: 78 y.o.   MRN: [id number]   HPI Patient presents stating skin has been doing quite well with medication and did get a little bit more medicine from family physician with elongated nailbeds 1-5 both feet that are thick and cannot be taken care of herself   ROS      Objective:  Physical Exam  Dermatitis condition improved with thick yellow brittle nailbeds 1-5 both feet painful     Assessment:  Mycotic nail infection 1-5 both feet and dermatitis improved     Plan:  H&P reviewed debrided nailbeds 1-5 both feet no iatrogenic bleeding reappoint routine care

## 2023-08-07 ENCOUNTER — Ambulatory Visit (INDEPENDENT_AMBULATORY_CARE_PROVIDER_SITE_OTHER)

## 2023-08-07 DIAGNOSIS — M810 Age-related osteoporosis without current pathological fracture: Secondary | ICD-10-CM

## 2023-08-07 MED ORDER — ROMOSOZUMAB-AQQG 105 MG/1.17ML ~~LOC~~ SOSY
210.0000 mg | PREFILLED_SYRINGE | Freq: Once | SUBCUTANEOUS | Status: AC
  Administered 2023-08-07: 210 mg via SUBCUTANEOUS

## 2023-08-07 MED ORDER — ROMOSOZUMAB-AQQG 105 MG/1.17ML ~~LOC~~ SOSY
210.0000 mg | PREFILLED_SYRINGE | SUBCUTANEOUS | Status: AC
  Administered 2023-09-07: 210 mg via SUBCUTANEOUS

## 2023-08-07 NOTE — Progress Notes (Signed)
Pt was given evenity injection with no complications.

## 2023-08-10 ENCOUNTER — Telehealth: Payer: Self-pay | Admitting: Neurology

## 2023-08-10 NOTE — Telephone Encounter (Signed)
 Received a notification from [facility]fusion that the infusion is being denied. The denial criteria doesn't make sense       I have completed an appeal letter and marked it as a urgent appeal. Received confirmation via fax.

## 2023-08-18 ENCOUNTER — Ambulatory Visit: Payer: Medicare PPO | Admitting: Dermatology

## 2023-09-01 ENCOUNTER — Ambulatory Visit: Payer: Self-pay

## 2023-09-01 NOTE — Telephone Encounter (Addendum)
  Chief Complaint: fatigue/ear ringing  Symptoms: tired Disposition: [] ED /[] Urgent Care (no appt availability in office) / [] Appointment(In office/virtual)/ []  [facility]/ [] Home Care/ [] Refused Recommended Disposition /[] [facility]/ []  Follow-up with PCP Additional Notes: Pt's husband, Wendy Serrano, called with concerns of fatigue "just not feeling well. She has this constant ringing in her left ear that we've had tested with no luck on diagnosis." Wendy Serrano stated, " she had lots of energy yesterday, we went to the gym and then she was working around the house decorating. She got in the bed late around 0330 this morning." Pt then comes on speaker phone and says she is not tired and just has ear ringing. [name] shared she hasn't eaten today nor is taking pregabalin  medication as directed. [name] stated they would start taking that appropriately. Bp was 111/60, HR 60 this morning. RN advised Wendy Serrano to make sure pt is resting and eating approprietly today. If symptoms worsen to call us  back. [name] verbalized understanding.             Copied from CRM [id number]. Topic: Clinical - Red Word Triage >> Sep 01, 2023  2:42 PM Wendy Serrano wrote: [facility] that prompted transfer to Nurse Triage: lethargic- also ear ringing Reason for Disposition  Mild weakness or fatigue with acute minor illness (e.g., colds)  Answer Assessment - Initial Assessment Questions 1. DESCRIPTION: "Describe how you are feeling."     "Just doesn't feel good" 2. SEVERITY: "How bad is it?"  "Can you stand and walk?"   - MILD (0-3): Feels weak or tired, but does not interfere with work, school or normal activities.   - MODERATE (4-7): Able to stand and walk; weakness interferes with work, school, or normal activities.   - SEVERE (8-10): Unable to stand or walk; unable to do usual activities.     mild 3. ONSET: "When did these symptoms begin?" (e.g., hours, days, weeks, months)     This morning 4. CAUSE: "What do you  think is causing the weakness or fatigue?" (e.g., not drinking enough fluids, medical problem, trouble sleeping)     Not sure 5. NEW MEDICINES:  "Have you started on any new medicines recently?" (e.g., opioid pain medicines, benzodiazepines, muscle relaxants, antidepressants, antihistamines, neuroleptics, beta blockers)     denies 6. OTHER SYMPTOMS: "Do you have any other symptoms?" (e.g., chest pain, fever, cough, SOB, vomiting, diarrhea, bleeding, other areas of pain)     Left ear  Protocols used: Weakness (Generalized) and Fatigue-A-AH

## 2023-09-01 NOTE — Telephone Encounter (Signed)
 The appeal that was sent to maximum for review is also being denied.

## 2023-09-02 ENCOUNTER — Ambulatory Visit: Admitting: Dermatology

## 2023-09-07 ENCOUNTER — Ambulatory Visit

## 2023-09-07 DIAGNOSIS — M810 Age-related osteoporosis without current pathological fracture: Secondary | ICD-10-CM

## 2023-09-07 NOTE — Progress Notes (Cosign Needed)
 pt received Evenity  injections w/o complications   Medical screening examination/treatment/procedure(s) were performed by non-physician practitioner and as supervising physician I was immediately available for consultation/collaboration.  I agree with above. Adelaide Holy, MD

## 2023-09-08 ENCOUNTER — Ambulatory Visit (INDEPENDENT_AMBULATORY_CARE_PROVIDER_SITE_OTHER)

## 2023-09-08 VITALS — Ht 68.0 in | Wt 133.0 lb

## 2023-09-08 DIAGNOSIS — Z0000 Encounter for general adult medical examination without abnormal findings: Secondary | ICD-10-CM | POA: Diagnosis not present

## 2023-09-08 NOTE — Progress Notes (Cosign Needed Addendum)
 Subjective:   Wendy Serrano is a 78 y.o. who presents for a Medicare Wellness preventive visit.  Visit Complete: Virtual I connected with  Wendy Serrano on 09/08/23 by a audio enabled telemedicine application and verified that I am speaking with the correct person using two identifiers.  Patient Location: Home  Provider Location: Home Office  I discussed the limitations of evaluation and management by telemedicine. The patient expressed understanding and agreed to proceed.  Vital Signs: Because this visit was a virtual/telehealth visit, some criteria may be missing or patient reported. Any vitals not documented were not able to be obtained and vitals that have been documented are patient reported.  VideoDeclined- This patient declined Librarian, academic. Therefore the visit was completed with audio only.  Persons Participating in Visit: Patient assisted by her husband.  AWV Questionnaire: No: Patient Medicare AWV questionnaire was not completed prior to this visit.  Cardiac Risk Factors include: advanced age (>10men, >68 women)     Objective:    Today's Vitals   09/08/23 1304  Weight: 133 lb (60.3 kg)  Height: 5\' 8"  (1.727 m)   Body mass index is 20.22 kg/m.     09/08/2023    1:34 PM 12/19/2022    3:05 PM 04/08/2022    3:59 PM 04/19/2021   10:13 AM 03/29/2021    2:43 PM 03/28/2021    2:47 AM 12/07/2019    3:59 PM  Advanced Directives  Does Patient Have a Medical Advance Directive? Yes Yes No No Yes No Yes  Type of Estate agent of [place];Living will Healthcare Power of [place];Living will   Healthcare Power of [place];Living will  Healthcare Power of [place];Living will  Does patient want to make changes to medical advance directive?       No - Patient declined  Copy of Healthcare Power of Attorney in Chart? No - copy requested No - copy requested   No - copy requested  No - copy requested  Would patient like  information on creating a medical advance directive?   No - Patient declined        Current Medications (verified) Outpatient Encounter Medications as of 09/08/2023  Medication Sig   aspirin 81 MG chewable tablet Chew 81 mg by mouth daily.   clobetasol cream (TEMOVATE) 0.05 % Apply topically 2 (two) times daily.   clotrimazole -betamethasone  (LOTRISONE ) cream Apply 1 Application topically daily.   cyclobenzaprine  (FLEXERIL ) 5 MG tablet Take 1 tablet (5 mg total) by mouth 3 (three) times daily as needed.   donepezil  (ARICEPT ) 10 MG tablet TAKE 1/2 TABLET BY MOUTH AT BEDTIME FOR 1 MONTH,THEN TAKE 1 TABLET AT BEDTIME THEREAFTER   FOLIC ACID PO Take 4 tablets by mouth daily.   glucosamine-chondroitin 500-400 MG tablet Take 1 tablet by mouth 2 (two) times daily.   ketoconazole  (NIZORAL ) 2 % cream APPLY TO AFFECTED AREA TWICE A DAY   Lecanemab -irmb (LEQEMBI ) 200 MG/2ML SOLN IV therapy to start after PT scan,   Magnesium 400 MG TABS Take 1 tablet by mouth daily.   memantine  (NAMENDA ) 10 MG tablet Take 1 tablet (10 mg total) by mouth 2 (two) times daily.   methotrexate (RHEUMATREX) 2.5 MG tablet 6 tablets on Saturday   mometasone  (ELOCON ) 0.1 % cream Use as directed to affected area twice per day as needed   prednisoLONE acetate (PRED FORTE) 1 % ophthalmic suspension Place 1 drop into both eyes daily.   pregabalin  (LYRICA ) 25 MG capsule TAKE 1 CAPSULE BY  MOUTH 2 TIMES DAILY.   rivastigmine  (EXELON ) 4.6 mg/24hr Place 1 patch (4.6 mg total) onto the skin daily.   SIMBRINZA 1-0.2 % SUSP Place 1 drop into both eyes 2 (two) times daily.   SODIUM FLUORIDE 5000 PPM 1.1 % PSTE as directed.   tacrolimus  (PROTOPIC ) 0.1 % ointment Apply topically 2 (two) times daily. Apply 2 x daily to palms for 2 weeks then stop. Alternate with Clotrimazole /Betamethasone  cream   traMADol  (ULTRAM ) 50 MG tablet Take 1 tablet (50 mg total) by mouth every 6 (six) hours as needed.   UNABLE TO FIND Take 2 capsules by mouth daily.  Med Name: Lions mane   UNABLE TO FIND Take 2 capsules by mouth daily. Med Name: Kyolic- for cholesterol   Vitamin D -Vitamin K (VITAMIN K2-VITAMIN D3 PO) Take by mouth.   No facility-administered encounter medications on file as of 09/08/2023.    Allergies (verified) Patient has no known allergies.   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   Menopausal symptoms    Renal cyst    bilateral   Rotator cuff tear    Yeast infection    Past Surgical History:  Procedure Laterality Date   ROTATOR CUFF REPAIR Left 08/2009   Left shoulder-rotator cuff repair Murrel Arnt)   WISDOM TOOTH EXTRACTION     Family History  Problem Relation Age of Onset   Prostate cancer Father        died   Pancreatic cancer Mother        died   Diabetes Mother    Hyperlipidemia Sister    Social History   Socioeconomic History   Marital status: Married    Spouse name: [name]   Number of children: 0   Years of education: college   Highest education level: Master's degree (e.g., MA, MS, MEng, MEd, MSW, MBA)  Occupational History   Occupation: Retired  Tobacco Use   Smoking status: Never   Smokeless tobacco: Never  Vaping Use   Vaping status: Never Used  Substance and Sexual Activity   Alcohol use: No   Drug use: No   Sexual activity: Never    Birth control/protection: Post-menopausal  Other Topics Concern   Not on file  Social History Narrative   [employer] BA; MEd-teaching;MEd Administration @ [employer]ried '67   Has 11 children who were foster care or mentoredRetiredSO  retired, Has had some health problems. They are doing well   Marriage is in good health and retirement is goodEnd-of-life: provided packet of information and forms for consideration (Oct'11)      Lives with husband   Right handed   Caffeine: zero   Social Drivers of Health   Financial Resource Strain: Low Risk  (09/08/2023)   Overall Financial Resource Strain (CARDIA)    Difficulty of Paying Living Expenses: Not hard at all  Food Insecurity: No Food Insecurity (09/08/2023)   Hunger Vital Sign    Worried About Running Out of Food in the Last Year:  Never true    Ran Out of Food in the Last Year: Never true  Transportation Needs: No Transportation Needs (09/08/2023)   PRAPARE - Administrator, Civil Service (Medical): No    Lack of Transportation (Non-Medical): No  Physical Activity: Sufficiently Active (09/08/2023)   Exercise Vital Sign    Days of Exercise per Week: 3 days    Minutes of Exercise per Session: 60 min  Stress: No Stress Concern Present (09/08/2023)   [employer] of Occupational Health - Occupational Stress Questionnaire    Feeling of Stress : Only a little  Social Connections: Socially Integrated (09/08/2023)   Social Connection and Isolation Panel [NHANES]    Frequency of Communication with Friends and Family: More than three times a week    Frequency of Social Gatherings with Friends and Family: Twice a week    Attends Religious Services: More than 4 times per year    Active Member of [employer] or Organizations: Yes    Attends Banker Meetings: Never    Marital Status: Married    Tobacco Counseling Counseling given: Not Answered    Clinical Intake:  Pre-visit preparation completed: Yes  Pain : No/denies pain     BMI - recorded: 20.22 Nutritional Status: BMI of 19-24  Normal Nutritional Risks: None Diabetes: No  Lab Results  Component Value Date   HGBA1C 5.4 02/26/2011   HGBA1C 5.6 02/19/2010     How often do you need to  have someone help you when you read instructions, pamphlets, or other written materials from your doctor or pharmacy?: 1 - Never     Information entered by :: Shambhavi Salley, RMA   Activities of Daily Living     09/08/2023    1:05 PM 12/19/2022    3:03 PM  In your present state of health, do you have any difficulty performing the following activities:  Hearing? 0 0  Vision? 0 0  Difficulty concentrating or making decisions? 0 0  Walking or climbing stairs? 0 0  Dressing or bathing? 0 0  Doing errands, shopping? 0 0  Comment Husband drives her   Preparing Food and eating ? N N  Using the Toilet? N N  In the past six months, have you accidently leaked urine? N N  Do you have problems with loss of bowel control? N N  Managing your Medications? N N  Managing your Finances? N N  Housekeeping or managing your Housekeeping? N N    Patient Care Team: Plotnikov, Oakley Bellman, MD as PCP - General (Internal Medicine) Coy Ditty, Adelaida Adie, MD (Inactive) (Obstetrics and Gynecology) Adah Acron, MD (Inactive) (Orthopedic Surgery) Claiborne Crew, MD as Consulting Physician (Orthopedic Surgery) Edna Gouty, MD as Referring Physician (Ophthalmology) Miller Allis, MD as Referring Physician (Ophthalmology) Ivery Marking, MD as Consulting Physician (Obstetrics and Gynecology) Dohmeier, Raoul Byes, MD as Consulting Physician (Neurology) Virgina Grills, MD as Consulting Physician (Otolaryngology) [facility]  Indicate any recent Medical Services you may have received from other than [facility] providers in the past year (date may be approximate).     Assessment:   This is a routine wellness examination for Xoie.  Hearing/Vision screen No results found.   Goals Addressed   None    Depression Screen     09/08/2023    1:36 PM 06/22/2023    3:47 PM 12/31/2022    2:52 PM 12/19/2022    3:08 PM 09/23/2022    9:24 AM 08/25/2022  1:21 PM 07/09/2022    3:58 PM  PHQ 2/9 Scores  PHQ -  2 Score 0 0 0 0 0 0 0  PHQ- 9 Score 0  0 0   0    Fall Risk     09/08/2023    1:34 PM 06/22/2023    3:47 PM 04/02/2023   11:32 AM 12/31/2022    2:52 PM 12/19/2022    3:05 PM  Fall Risk   Falls in the past year? 0 0 0 0 0  Number falls in past yr: 0 0 0 0 0  Injury with Fall? 0 0 0 0 0  Risk for fall due to : No Fall Risks No Fall Risks No Fall Risks No Fall Risks No Fall Risks  Follow up Falls prevention discussed;Falls evaluation completed Falls evaluation completed Falls evaluation completed Falls evaluation completed Falls prevention discussed    MEDICARE RISK AT HOME:     TIMED UP AND GO:  Was the test performed?  No  Cognitive Function: Impaired: Patient has current diagnosis of cognitive impairment.    07/09/2023   11:24 AM 03/24/2023   12:07 PM 03/13/2022   10:41 AM 09/11/2021    2:34 PM 04/29/2021   10:21 AM  MMSE - Mini Mental State Exam  Orientation to time 3 3 5 5 4   Orientation to Place 5 5 5 5 5   Registration 3 3 3 3 3   Attention/ Calculation 2 4 2 5 2   Recall 0 0 2 1 1   Language- name 2 objects 2 2 2 2 2   Language- repeat 1 1 1  0 0  Language- follow 3 step command 2 3 2 3 3   Language- read & follow direction 1 1 1 1 1   Write a sentence 1 1 1 1 1   Copy design 0 0 1 0 0  Total score 20 23 25 26 22       03/24/2023   10:47 AM 08/12/2022    8:20 AM  Montreal Cognitive Assessment   Visuospatial/ Executive (0/5) 0 1  Naming (0/3) 2 3  Attention: Read list of digits (0/2) 1 2  Attention: Read list of letters (0/1) 1 1  Attention: Serial 7 subtraction starting at 100 (0/3) 1 1  Language: Repeat phrase (0/2) 1 1  Language : Fluency (0/1) 0 0  Abstraction (0/2) 2 2  Delayed Recall (0/5) 0 0  Orientation (0/6) 3 3  Total 11 14      12/19/2022    3:06 PM 04/08/2022    4:01 PM  6CIT Screen  What Year? 0 points 0 points  What month? 0 points 0 points  What time? 0 points 0 points  Count back from 20 0 points 0 points  Months in reverse 0 points 4 points   Repeat phrase 0 points 2 points  Total Score 0 points 6 points    Immunizations Immunization History  Administered Date(s) Administered   Fluad Quad(high Dose 65+) 01/12/2019, 02/04/2022   Influenza Split 02/26/2011, 03/01/2012   Influenza Whole 02/10/2008, 02/15/2009, 02/19/2010   Influenza, High Dose Seasonal PF 03/09/2013, 03/21/2015, 04/01/2016, 03/03/2017, 01/23/2021   Influenza,inj,Quad PF,6+ Mos 05/10/2014, 02/13/2018   PFIZER(Purple Top)SARS-COV-2 Vaccination 05/25/2019, 06/15/2019, 02/04/2020, 08/11/2020   Pfizer Covid-19 Vaccine Bivalent Booster 24yrs & up 01/23/2021   Pfizer(Comirnaty)Fall Seasonal Vaccine 12 years and older 02/04/2022   Pneumococcal Conjugate-13 03/23/2013   Pneumococcal Polysaccharide-23 02/19/2010, 05/18/2013   Td 02/10/2008   Td (Adult), 2 Lf Tetanus Toxid, Preservative Free 02/10/2008  Tdap 06/14/2018   Zoster, Live 07/04/2009    Screening Tests Health Maintenance  Topic Date Due   INFLUENZA VACCINE  12/11/2023   Medicare Annual Wellness (AWV)  06/09/2024   DTaP/Tdap/Td (3 - Td or Tdap) 06/14/2028   Pneumonia Vaccine 39+ Years old  Completed   DEXA SCAN  Completed   Hepatitis C Screening  Completed   HPV VACCINES  Aged Out   Meningococcal B Vaccine  Aged Out   Colonoscopy  Discontinued   COVID-19 Vaccine  Discontinued   Zoster Vaccines- Shingrix  Discontinued    Health Maintenance  There are no preventive care reminders to display for this patient. Health Maintenance Items Addressed: See Nurse Notes  Additional Screening:  Vision Screening: Recommended annual ophthalmology exams for early detection of glaucoma and other disorders of the eye.  Dental Screening: Recommended annual dental exams for proper oral hygiene  Community Resource Referral / Chronic Care Management: CRR required this visit?  No   CCM required this visit?  No     Plan:     I have personally reviewed and noted the following in the patient's chart:    Medical and social history Use of alcohol, tobacco or illicit drugs  Current medications and supplements including opioid prescriptions. Patient is not currently taking opioid prescriptions. Functional ability and status Nutritional status Physical activity Advanced directives List of other physicians Hospitalizations, surgeries, and ER visits in previous 12 months Vitals Screenings to include cognitive, depression, and falls Referrals and appointments  In addition, I have reviewed and discussed with patient certain preventive protocols, quality metrics, and best practice recommendations. A written personalized care plan for preventive services as well as general preventive health recommendations were provided to patient.     Camora Tremain L Dannia Snook, CMA   09/08/2023   After Visit Summary: (MyChart) Due to this being a telephonic visit, the after visit summary with patients personalized plan was offered to patient via MyChart   Notes: Nothing significant to report at this time.  Medical screening examination/treatment/procedure(s) were performed by non-physician practitioner and as supervising physician I was immediately available for consultation/collaboration.  I agree with above. Adelaide Holy, MD

## 2023-09-08 NOTE — Patient Instructions (Signed)
 Ms. Cacy , Thank you for taking time to come for your Medicare Wellness Visit. I appreciate your ongoing commitment to your health goals. Please review the following plan we discussed and let me know if I can assist you in the future.   Referrals/Orders/Follow-Ups/Clinician Recommendations: It was nice talking to you today.  Keep up the good work.  This is a list of the screening recommended for you and due dates:  Health Maintenance  Topic Date Due   Flu Shot  12/11/2023   Medicare Annual Wellness Visit  06/09/2024   DTaP/Tdap/Td vaccine (3 - Td or Tdap) 06/14/2028   Pneumonia Vaccine  Completed   DEXA scan (bone density measurement)  Completed   Hepatitis C Screening  Completed   HPV Vaccine  Aged Out   Meningitis B Vaccine  Aged Out   Colon Cancer Screening  Discontinued   COVID-19 Vaccine  Discontinued   Zoster (Shingles) Vaccine  Discontinued    Advanced directives: (Copy Requested) Please bring a copy of your health care power of attorney and living will to the office to be added to your chart at your convenience. You can mail to [facility] [address] [place], [place] [address] or email to [email or web address]  Next Medicare Annual Wellness Visit scheduled for next year: Yes

## 2023-09-09 ENCOUNTER — Encounter: Payer: Self-pay | Admitting: Internal Medicine

## 2023-09-14 DIAGNOSIS — M25561 Pain in right knee: Secondary | ICD-10-CM | POA: Diagnosis not present

## 2023-09-22 ENCOUNTER — Other Ambulatory Visit: Payer: Self-pay | Admitting: Neurology

## 2023-09-22 ENCOUNTER — Telehealth: Payer: Self-pay | Admitting: Podiatry

## 2023-09-22 ENCOUNTER — Other Ambulatory Visit: Payer: Self-pay

## 2023-09-22 DIAGNOSIS — M810 Age-related osteoporosis without current pathological fracture: Secondary | ICD-10-CM

## 2023-09-22 MED ORDER — ROMOSOZUMAB-AQQG 105 MG/1.17ML ~~LOC~~ SOSY
210.0000 mg | PREFILLED_SYRINGE | SUBCUTANEOUS | Status: AC
Start: 2023-10-05 — End: 2023-10-08
  Administered 2023-10-08: 210 mg via SUBCUTANEOUS

## 2023-09-22 NOTE — Progress Notes (Signed)
 Patient scheduled in 5/28 for Evenity  injection. Orders placed for PA completion before injection date.

## 2023-09-22 NOTE — Telephone Encounter (Signed)
 Husband LVM stating wife has been on the following: clotrimazole -betamethasone  (LOTRISONE ) cream for a year and is now out of it. Wants to know what's the next steps, or do you need to send a refill. Only seen some but not much results after been on  it for a year.

## 2023-09-22 NOTE — Telephone Encounter (Signed)
 Pt

## 2023-09-24 NOTE — Telephone Encounter (Signed)
 She would probably be best to see a dermatologist at this point

## 2023-09-24 NOTE — Telephone Encounter (Signed)
 Have her see another doc in the group. I don't have any ideas for her

## 2023-09-28 NOTE — Telephone Encounter (Signed)
 I don't have any ideas for them

## 2023-10-07 ENCOUNTER — Ambulatory Visit

## 2023-10-08 ENCOUNTER — Ambulatory Visit

## 2023-10-08 DIAGNOSIS — M810 Age-related osteoporosis without current pathological fracture: Secondary | ICD-10-CM | POA: Diagnosis not present

## 2023-10-08 NOTE — Progress Notes (Addendum)
 Patient visits today for their evenity injection. Patient informed of what they  had received and tolerated injection well. Patient notified to reach out to office if needed.  Medical screening examination/treatment/procedure(s) were performed by non-physician practitioner and as supervising physician I was immediately available for consultation/collaboration.  I agree with above. Jacinta Shoe, MD

## 2023-10-12 ENCOUNTER — Ambulatory Visit: Admitting: Dermatology

## 2023-10-12 ENCOUNTER — Ambulatory Visit: Admitting: Internal Medicine

## 2023-10-12 ENCOUNTER — Encounter: Payer: Self-pay | Admitting: Internal Medicine

## 2023-10-12 ENCOUNTER — Encounter: Payer: Self-pay | Admitting: Dermatology

## 2023-10-12 VITALS — BP 120/68 | HR 69 | Temp 97.8°F | Ht 68.0 in | Wt 129.0 lb

## 2023-10-12 VITALS — BP 108/72 | HR 76

## 2023-10-12 DIAGNOSIS — L309 Dermatitis, unspecified: Secondary | ICD-10-CM | POA: Diagnosis not present

## 2023-10-12 DIAGNOSIS — R252 Cramp and spasm: Secondary | ICD-10-CM | POA: Diagnosis not present

## 2023-10-12 DIAGNOSIS — F02A4 Dementia in other diseases classified elsewhere, mild, with anxiety: Secondary | ICD-10-CM | POA: Diagnosis not present

## 2023-10-12 DIAGNOSIS — E559 Vitamin D deficiency, unspecified: Secondary | ICD-10-CM | POA: Diagnosis not present

## 2023-10-12 DIAGNOSIS — G301 Alzheimer's disease with late onset: Secondary | ICD-10-CM

## 2023-10-12 DIAGNOSIS — B353 Tinea pedis: Secondary | ICD-10-CM | POA: Diagnosis not present

## 2023-10-12 LAB — MAGNESIUM: Magnesium: 2 mg/dL (ref 1.5–2.5)

## 2023-10-12 LAB — COMPREHENSIVE METABOLIC PANEL WITH GFR
ALT: 29 U/L (ref 0–35)
AST: 31 U/L (ref 0–37)
Albumin: 4.3 g/dL (ref 3.5–5.2)
Alkaline Phosphatase: 70 U/L (ref 39–117)
BUN: 23 mg/dL (ref 6–23)
CO2: 29 meq/L (ref 19–32)
Calcium: 9.6 mg/dL (ref 8.4–10.5)
Chloride: 108 meq/L (ref 96–112)
Creatinine, Ser: 0.88 mg/dL (ref 0.40–1.20)
GFR: 62.63 mL/min (ref >60.00)
Glucose, Bld: 94 mg/dL (ref 70–99)
Potassium: 4 meq/L (ref 3.5–5.1)
Sodium: 142 meq/L (ref 135–145)
Total Bilirubin: 0.6 mg/dL (ref 0.2–1.2)
Total Protein: 6.8 g/dL (ref 6.0–8.3)

## 2023-10-12 LAB — CK: Total CK: 264 U/L — ABNORMAL HIGH (ref 7–177)

## 2023-10-12 MED ORDER — CLOBETASOL PROPIONATE 0.05 % EX CREA: TOPICAL_CREAM | Freq: Two times a day (BID) | CUTANEOUS | 6 refills | Status: DC

## 2023-10-12 MED ORDER — SAFETY SEAL MISCELLANEOUS MISC: 1.0000 | Freq: Every evening | 10 refills | Status: AC

## 2023-10-12 NOTE — Assessment & Plan Note (Signed)
 Hold donepezil  x 2 weeks due to cramps If cramps resolved - re-start donepezil  at 5 mg/day Mag po Standing a lot - Sketchers flip flops

## 2023-10-12 NOTE — Progress Notes (Signed)
 Follow-Up Visit   Subjective  Wendy Serrano is a 78 y.o. female accompanied by Husband Autry Legions) who presents for the following: Hand Dermatitis/Tinea Pedis   Patient present today for follow up visit for Hand Dermatitis/ Tinea Pedis . Patient was last evaluated on 12/30/22. At this visit patient was advised Continue betamethasone  cream twice a day for two weeks, then switch to tacrolimus  ointment twice a day for another two weeks. Alternate between betamethasone  and tacrolimus  until scaliness resolves & for tinea pedis Continue terbinafine  (Lamisil ) as prescribed by the podiatrist for a total of one month. Apply ketoconazole  cream twice a day for the feet.Patient had a really bad flare in February she was seen by another Dermatologist. She was given Clobetasol Cream. Patient reports sxs are better. Patient denies medication changes.  The following portions of the chart were reviewed this encounter and updated as appropriate: medications, allergies, medical history  Review of Systems:  No other skin or systemic complaints except as noted in HPI or Assessment and Plan.  Objective  Well appearing patient in no apparent distress; mood and affect are within normal limits.  A focused examination was performed of the following areas: B/L Hands and Feet  Relevant exam findings are noted in the Assessment and Plan.                        Assessment & Plan    1. Chronic Eczema of the Hands - Assessment:  Patient's hands show significant improvement since the last visit in August. Since then she was seen by an outside dermatologist and was given Clobetasol, prescribed by Dr. Felicitas Horse.  This has been more effective than the previously used betamethasone . The condition is chronic and requires ongoing management.   - Plan:    Continue clobetasol as prescribed:     - 2 weeks on, 2 weeks off to prevent skin thinning    During 2 weeks off clobetasol:     - Use urea-containing cream  (same as for feet) to soften and remove scaling    Maintain daily sunscreen use for hyperpigmentation control    Discussed Dupixent (dupilumab) as a potential better long-term treatment option:     - Biologic injectable medication     - Administered every 2 weeks     - Self-administered at home via auto-injector     - Injection sites: thighs, belly (self-injection), arms (if administered by others)     - Patient educated on mechanism of action and administration  2. Tinea Pedis - Assessment:  Patient's feet have shown improvement with oral antifungal treatment. The condition was initially suspected to be fungal, but a definitive diagnosis was deferred. The podiatrist noted improvement after 6 weeks of oral antifungal therapy. Persistent scaling is observed, which could be due to fungal infection or age-related changes in skin cell turnover since the itching has fully resolved. Stasis dermatitis is also present on the side of the foot.  - Plan:    Prescribe Soft Heal Cream (40% urea, 12% salicylic acid):     - Apply thin layer once daily before bed     - Wear socks after application    After 3 weeks of cream use:     - Begin exfoliation with pumice stone    Monitor for itching or other changes in symptoms    Patient education:     - Educate on increased risk of fungal infections due to toenail involvement TINEA PEDIS OF BOTH  FEET   Related Medications Safety Seal Miscellaneous MISC Apply 1 Application topically at bedtime. Medication Name: Soft Heel Cream (Urea 40%, Salicylic Acid 12%)  Return in about 4 months (around 02/11/2024) for Hand Eczema and Tinea Pedis F/U.  I, Jetta Ager, am acting as Neurosurgeon for [employer], DO.   Documentation: I have reviewed the above documentation for accuracy and completeness, and I agree with the above.  Louana Roup, DO

## 2023-10-12 NOTE — Patient Instructions (Addendum)
 Date: Mon Oct 12 2023  Hello Wendy Serrano,  Thank you for visiting today. Here is a summary of the key instructions:  - Medications: Continue using clobetasol cream for hands:   - Apply for 2 weeks, then stop for 2 weeks   - Repeat this cycle to avoid skin thinning  - Skin Care: Use Soft Heal Cream with 40% urea and 12% salicylic acid on feet: (this is a compound prescription sent to [facility])   - Apply a thin layer once a day before bed   - Wear socks after applying  - During 2 weeks off clobetasol, use the exfoliating cream on hands to soften and remove scaling   - Use sunscreen daily to control hyperpigmentation  - Foot Care: After 3 weeks of using Compound urea cream, use a pumice stone to exfoliate feet (or get this done during your pedicures)  - Treatment Options: Consider Dupixent (injectable medication) for long-term eczema control:   - Requires injection every 2 weeks   - Can be self-administered at home   - Injection sites: thighs, belly (if self-injecting), or arm (if someone else is injecting)   - We will discuss this at your follow up appointment  We look forward to seeing the positive changes in your next visit. If you have any questions or concerns before then, please do not hesitate to contact our office.  Warm regards,  Dr. Louana Roup, Dermatology      Important Information   Due to recent changes in healthcare laws, you may see results of your pathology and/or laboratory studies on MyChart before the doctors have had a chance to review them. We understand that in some cases there may be results that are confusing or concerning to you. Please understand that not all results are received at the same time and often the doctors may need to interpret multiple results in order to provide you with the best plan of care or course of treatment. Therefore, we ask that you please give us  2 business days to thoroughly review all your results before  contacting the office for clarification. Should we see a critical lab result, you will be contacted sooner.     If You Need Anything After Your Visit   If you have any questions or concerns for your doctor, please call our main line at [phone]. If no one answers, please leave a voicemail as directed and we will return your call as soon as possible. Messages left after 4 pm will be answered the following business day.    You may also send us  a message via MyChart. We typically respond to MyChart messages within 1-2 business days.  For prescription refills, please ask your pharmacy to contact our office. Our fax number is [phone].  If you have an urgent issue when the clinic is closed that cannot wait until the next business day, you can page your doctor at the number below.     Please note that while we do our best to be available for urgent issues outside of office hours, we are not available 24/7.    If you have an urgent issue and are unable to reach us , you may choose to seek medical care at your doctor's office, retail clinic, urgent care center, or emergency room.   If you have a medical emergency, please immediately call 911 or go to the emergency department. In the event of inclement weather, please call our main line at [phone] for an update on  the status of any delays or closures.  Dermatology Medication Tips: Please keep the boxes that topical medications come in in order to help keep track of the instructions about where and how to use these. Pharmacies typically print the medication instructions only on the boxes and not directly on the medication tubes.   If your medication is too expensive, please contact our office at [phone] or send us  a message through MyChart.    We are unable to tell what your co-pay for medications will be in advance as this is different depending on your insurance coverage. However, we may be able to find a substitute medication at  lower cost or fill out paperwork to get insurance to cover a needed medication.    If a prior authorization is required to get your medication covered by your insurance company, please allow us  1-2 business days to complete this process.   Drug prices often vary depending on where the prescription is filled and some pharmacies may offer cheaper prices.   The website www.goodrx.com contains coupons for medications through different pharmacies. The prices here do not account for what the cost may be with help from insurance (it may be cheaper with your insurance), but the website can give you the price if you did not use any insurance.  - You can print the associated coupon and take it with your prescription to the pharmacy.  - You may also stop by our office during regular business hours and pick up a GoodRx coupon card.  - If you need your prescription sent electronically to a different pharmacy, notify our office through [facility] or by phone at [phone]

## 2023-10-12 NOTE — Patient Instructions (Addendum)
 Hold donepezil  x 2 weeks If cramps resolved - re-start donepezil  at 5 mg/day  Sketchers flip flops

## 2023-10-12 NOTE — Assessment & Plan Note (Addendum)
 Hold donepezil  x 2 weeks If cramps resolved - re-start donepezil  at 5 mg/day Mag po Standing a lot - Sketchers flip flops

## 2023-10-12 NOTE — Progress Notes (Signed)
 Subjective:  Patient ID: Wendy Serrano, female    DOB: [birth year]  Age: 78 y.o. MRN: [id number]  CC: Medical Management of Chronic Issues (Cramps in calves in the morning worsening x3 months and numbness in feet)   HPI Wendy Serrano presents for cramps in the legs in am - mustard helps.  Follow-up on dementia Wendy Serrano is here with her husband Wendy Serrano  Medication Sig Dispense Refill   aspirin 81 MG chewable tablet Chew 81 mg by mouth daily.     clobetasol  cream (TEMOVATE ) 0.05 % Apply topically 2 (two) times daily. Apply for 2 week 60 g 6   clotrimazole -betamethasone  (LOTRISONE ) cream Apply 1 Application topically daily. 30 g 0   cyclobenzaprine  (FLEXERIL ) 5 MG tablet Take 1 tablet (5 mg total) by mouth 3 (three) times daily as needed. 40 tablet 1   donepezil  (ARICEPT ) 10 MG tablet TAKE 1/2 TABLET BY MOUTH AT BEDTIME FOR 1 MONTH,THEN TAKE 1 TABLET AT BEDTIME THEREAFTER 90 tablet 3   FOLIC ACID PO Take 4 tablets by mouth daily.     glucosamine-chondroitin 500-400 MG tablet Take 1 tablet by mouth 2 (two) times daily.     ketoconazole  (NIZORAL ) 2 % cream APPLY TO AFFECTED AREA TWICE A DAY 15 g 0   Lecanemab -irmb (LEQEMBI ) 200 MG/2ML SOLN IV therapy to start after PT scan, 2 mL 6   Magnesium 400 MG TABS Take 1 tablet by mouth daily.     methotrexate (RHEUMATREX) 2.5 MG tablet 6 tablets on Saturday  3   mometasone  (ELOCON ) 0.1 % cream Use as directed to affected area twice per day as needed 45 g 1   prednisoLONE acetate (PRED FORTE) 1 % ophthalmic suspension Place 1 drop into both eyes daily.     predniSONE (DELTASONE) 10 MG tablet Take 6 tablets po x1 day, 5 tablets po x 1 day, 4 tablets po x 1 day, 3 tablets po x 1 day, 2 tablets po x 1 day, 1 tablet po x 1 day     pregabalin  (LYRICA ) 25 MG capsule TAKE 1 CAPSULE BY MOUTH 2 TIMES DAILY. 60 capsule 5   rivastigmine  (EXELON ) 4.6 mg/24hr Place 1 patch (4.6 mg total) onto the skin daily. 90 patch 3   Safety Seal  Miscellaneous MISC Apply 1 Application topically at bedtime. Medication Name: Soft Heel Cream (Urea 40%, Salicylic Acid 12%) 30 g 10   SIMBRINZA 1-0.2 % SUSP Place 1 drop into both eyes 2 (two) times daily.  6   SODIUM FLUORIDE 5000 PPM 1.1 % PSTE as directed.     tacrolimus  (PROTOPIC ) 0.1 % ointment Apply topically 2 (two) times daily. Apply 2 x daily to palms for 2 weeks then stop. Alternate with Clotrimazole /Betamethasone  cream 100 g 2   traMADol  (ULTRAM ) 50 MG tablet Take 1 tablet (50 mg total) by mouth every 6 (six) hours as needed. 30 tablet 0   UNABLE TO FIND Take 2 capsules by mouth daily. Med Name: Lions mane     UNABLE TO FIND Take 2 capsules by mouth daily. Med Name: Kyolic- for cholesterol     Vitamin D -Vitamin K (VITAMIN K2-VITAMIN D3 PO) Take by mouth.     memantine  (NAMENDA ) 10 MG tablet Take 1 tablet (10 mg total) by mouth 2 (two) times daily.     No facility-administered medications prior to Serrano.    ROS: Review of Systems  Constitutional:  Negative for activity change, appetite change, chills, fatigue and unexpected weight change.  HENT:  Negative for congestion, mouth sores and sinus pressure.   Eyes:  Negative for visual disturbance.  Respiratory:  Negative for cough and chest tightness.   Gastrointestinal:  Negative for abdominal pain and nausea.  Genitourinary:  Negative for difficulty urinating, frequency and vaginal pain.  Musculoskeletal:  Negative for back pain and gait problem.  Skin:  Negative for pallor and rash.  Neurological:  Negative for dizziness, tremors, weakness, numbness and headaches.  Psychiatric/Behavioral:  Positive for decreased concentration and sleep disturbance. Negative for confusion.     Objective:  BP 120/68   Pulse 69   Temp 97.8 F (36.6 C) (Oral)   Ht 5' 8 (1.727 m)   Wt 129 lb (58.5 kg)   SpO2 98%   BMI 19.61 kg/m   BP Readings from Last 3 Encounters:  10/12/23 120/68  10/12/23 108/72  06/22/23 134/80    Wt Readings  from Last 3 Encounters:  10/12/23 129 lb (58.5 kg)  09/08/23 133 lb (60.3 kg)  06/22/23 135 lb 8 oz (61.5 kg)    Physical Exam Constitutional:      General: Wendy Serrano is not in acute distress.    Appearance: Wendy Serrano is well-developed. Wendy Serrano is obese.  HENT:     Head: Normocephalic.     Right Ear: External ear normal.     Left Ear: External ear normal.     Nose: Nose normal.   Eyes:     General:        Right eye: No discharge.        Left eye: No discharge.     Conjunctiva/sclera: Conjunctivae normal.     Pupils: Pupils are equal, round, and reactive to light.   Neck:     Thyroid : No thyromegaly.     Vascular: No JVD.     Trachea: No tracheal deviation.   Cardiovascular:     Rate and Rhythm: Normal rate and regular rhythm.     Heart sounds: Normal heart sounds.  Pulmonary:     Effort: No respiratory distress.     Breath sounds: No stridor. No wheezing.  Abdominal:     General: Bowel sounds are normal. There is no distension.     Palpations: Abdomen is soft. There is no mass.     Tenderness: There is no abdominal tenderness. There is no guarding or rebound.   Musculoskeletal:        General: No tenderness.     Cervical back: Normal range of motion and neck supple. No rigidity.     Right lower leg: No edema.     Left lower leg: No edema.  Lymphadenopathy:     Cervical: No cervical adenopathy.   Skin:    Findings: No erythema or rash.   Neurological:     Mental Status: Mental status is at baseline.     Cranial Nerves: No cranial nerve deficit.     Motor: No abnormal muscle tone.     Coordination: Coordination normal.     Gait: Gait normal.     Deep Tendon Reflexes: Reflexes normal.   Psychiatric:        Behavior: Behavior normal.        Thought Content: Thought content normal.        Judgment: Judgment normal.     Lab Results  Component Value Date   WBC 6.2 01/07/2023   HGB 13.2 01/07/2023   HCT 40.1 01/07/2023   PLT 265.0 01/07/2023   GLUCOSE 94 10/12/2023    CHOL  208 (H) 08/22/2020   TRIG 51.0 08/22/2020   HDL 86.40 08/22/2020   LDLDIRECT 127.9 03/09/2013   LDLCALC 112 (H) 08/22/2020   ALT 29 10/12/2023   AST 31 10/12/2023   NA 142 10/12/2023   K 4.0 10/12/2023   CL 108 10/12/2023   CREATININE 0.88 10/12/2023   BUN 23 10/12/2023   CO2 29 10/12/2023   TSH 3.69 01/07/2023   INR 1.1 (H) 05/16/2015   HGBA1C 5.4 02/26/2011    DG Bone Density Result Date: 06/10/2023 EXAM: DUAL X-RAY ABSORPTIOMETRY (DXA) FOR BONE MINERAL DENSITY IMPRESSION: Referring Physician:  Genia Kettering Your patient completed a bone mineral density test using GE Lunar iDXA system (analysis version: 16). Technologist: BEC PATIENT: Name: Wendy Serrano, Wendy Serrano Patient ID: [id number] Birth Date: Oct 18, 1945 Height: 66.0 in. Sex: Female Measured: 06/10/2023 Weight: 134.4 lbs. Indications: Advanced Age, Estrogen Deficient, Flexeril , Height Loss (781.91), High risk medication use, Lyrica , Postmenopausal, Prednisolone, Vitamin D  Deficient Fractures: NONE Treatments: Vitamin D  (E933.5) ASSESSMENT: The BMD measured at Femur Neck Left is 0.626 g/cm2 with a T-score of -3.0. This patient's diagnostic category is OSTEOPOROSIS according to World Health Organization [facility]) criteria. L3 & L4 were excluded due to degenerative changes. The quality of the exam is good. Site Region Measured Date Measured Age YA BMD Significant CHANGE T-score DualFemur Neck Left  5/10[age]    78.7         -3.0    0.626 g/cm2 AP Spine  L1-L2      5/10[age]    78.7         -1.9    0.936 g/cm2 DualFemur Total Mean 5/10/2023    78.7         -2.8    0.655 g/cm2 World Health Organization [facility]) criteria for post-menopausal, Caucasian Women: Normal       T-score at or above -1 SD Osteopenia   T-score between -1 and -2.5 SD Osteoporosis T-score at or below -2.5 SD RECOMMENDATION: 1. All patients should optimize calcium and vitamin D  intake. 2. Consider FDA-approved medical therapies in postmenopausal women and men aged 7 years and  older, based on the following: a. A hip or vertebral (clinical or morphometric) fracture. b. T-score = -2.5 at the femoral neck or spine after appropriate evaluation to exclude secondary causes. c. Low bone mass (T-score between -1.0 and -2.5 at the femoral neck or spine) and a 10-year probability of a hip fracture = 3% or a 10-year probability of a major osteoporosis-related fracture = 20% based on the [place] -adapted WHO algorithm. d. Clinician judgment and/or patient preferences may indicate treatment for people with 10-year fracture probabilities above or below these levels. FOLLOW-UP: Patients with diagnosis of osteoporosis or at high risk for fracture should have regular bone mineral density tests.? Patients eligible for Medicare are allowed routine testing every 2 years.? The testing frequency can be increased to one year for patients who have rapidly progressing disease, are receiving or discontinuing medical therapy to restore bone mass, or have additional risk factors. I have reviewed this study and agree with the findings. [facility] Electronically Signed   By: Sundra Engel M.D.   On: 06/10/2023 12:07    Assessment & Plan:   Problem List Items Addressed This Serrano     Dementia (HCC)   Hold donepezil  x 2 weeks due to cramps If cramps resolved - re-start donepezil  at 5 mg/day Mag po Standing a lot - Sketchers flip flops      Vitamin D  deficiency  Take vitamin D3 and K2      Cramps, muscle, general - Primary   Hold donepezil  x 2 weeks If cramps resolved - re-start donepezil  at 5 mg/day Mag po Standing a lot - Sketchers flip flops      Relevant Orders   Comprehensive metabolic panel with GFR (Completed)   Magnesium (Completed)   CK (Completed)      No orders of the defined types were placed in this encounter.     Follow-up: Return in about 3 months (around 01/12/2024) for a follow-up Serrano.  Anitra Barn, MD

## 2023-10-14 ENCOUNTER — Ambulatory Visit: Payer: Self-pay | Admitting: Internal Medicine

## 2023-10-16 ENCOUNTER — Other Ambulatory Visit: Payer: Self-pay | Admitting: Neurology

## 2023-10-16 ENCOUNTER — Telehealth: Payer: Self-pay | Admitting: Neurology

## 2023-10-16 MED ORDER — MEMANTINE HCL 10 MG PO TABS: 10.0000 mg | ORAL_TABLET | Freq: Two times a day (BID) | ORAL | 3 refills | Status: DC

## 2023-10-16 NOTE — Telephone Encounter (Signed)
 Meds ordered this encounter  Medications   memantine  (NAMENDA ) 10 MG tablet    Sig: Take 1 tablet (10 mg total) by mouth 2 (two) times daily.    Dispense:  180 tablet    Refill:  3

## 2023-10-25 NOTE — Assessment & Plan Note (Signed)
 Take vitamin D3 and K2

## 2023-10-27 ENCOUNTER — Telehealth: Payer: Self-pay | Admitting: Neurology

## 2023-10-27 DIAGNOSIS — L308 Other specified dermatitis: Secondary | ICD-10-CM | POA: Diagnosis not present

## 2023-10-27 DIAGNOSIS — B353 Tinea pedis: Secondary | ICD-10-CM | POA: Diagnosis not present

## 2023-10-27 NOTE — Addendum Note (Signed)
 Addended by: Elton Ham on: 10/27/2023 03:43 PM   Modules accepted: Orders

## 2023-10-27 NOTE — Telephone Encounter (Signed)
 Pt's spouse called wanting to know if there is any update on the Memory testing that the pt was suppose to be having along with her infusions. He would like a call back as soon as possible to [phone]

## 2023-10-27 NOTE — Telephone Encounter (Signed)
 Checked with the intrafusion and they never received a response from the insurance from where they sent the appeal information to their outside review team, [employer]. Since the last visit was outside of the 3 month start window they recommend having the patient to come in for updated visit, new orders, testing and everything so that we can attempt authorization again. We could also discuss Kisunla vs leqembi .  Wendy Serrano has opening tomorrow could see if they could come in with her.

## 2023-10-27 NOTE — Telephone Encounter (Signed)
 Called the pt's husband and he states that he would like to see about a 2nd opinion/referral to [facility] for their memory clinic. He states that this process has been frustrating with trying to get her what she needs. He states that in the evening she is having some changes and worsening.   Advised at this time we could bring her in for a visit, repeat testing and re submit everything to be submitted for authorization for leqambi or kisunla whichever they would prefer. I had a available opening tomorrow and he states she does better in the afternoon. He would like for now referral for 2nd opinion to be sent. I have faxed a referral to [facility] using the referral form that was online. I have sent her information pertaining to her health memory. Received confirmation  He was appreciative for the call back

## 2023-10-27 NOTE — Telephone Encounter (Addendum)
 Referral for neurology faxed by the nurse with referral all needed to [facility] memory clinic to [phone]

## 2023-10-29 ENCOUNTER — Encounter: Payer: Self-pay | Admitting: Neurology

## 2023-10-29 DIAGNOSIS — Z1231 Encounter for screening mammogram for malignant neoplasm of breast: Secondary | ICD-10-CM | POA: Diagnosis not present

## 2023-11-05 ENCOUNTER — Encounter: Payer: Self-pay | Admitting: Neurology

## 2023-11-09 ENCOUNTER — Ambulatory Visit (INDEPENDENT_AMBULATORY_CARE_PROVIDER_SITE_OTHER)

## 2023-11-09 DIAGNOSIS — M810 Age-related osteoporosis without current pathological fracture: Secondary | ICD-10-CM

## 2023-11-09 MED ORDER — ROMOSOZUMAB-AQQG 105 MG/1.17ML ~~LOC~~ SOSY
210.0000 mg | PREFILLED_SYRINGE | Freq: Once | SUBCUTANEOUS | Status: AC
  Administered 2023-11-09: 210 mg via SUBCUTANEOUS

## 2023-11-09 MED ORDER — ROMOSOZUMAB-AQQG 105 MG/1.17ML ~~LOC~~ SOSY
210.0000 mg | PREFILLED_SYRINGE | SUBCUTANEOUS | Status: AC
  Administered 2023-12-16 – 2024-03-17 (×2): 210 mg via SUBCUTANEOUS

## 2023-11-09 NOTE — Progress Notes (Addendum)
 After obtaining consent, and per orders of Dr. Georgia Kipper, injection of Evenity  given by Bretta Camp. Patient instructed to report any adverse reaction to me immediately.   Medical screening examination/treatment/procedure(s) were performed by non-physician practitioner and as supervising physician I was immediately available for consultation/collaboration.  I agree with above. Adelaide Holy, MD

## 2023-11-10 ENCOUNTER — Telehealth: Payer: Self-pay | Admitting: Neurology

## 2023-11-10 ENCOUNTER — Telehealth: Payer: Self-pay

## 2023-11-10 ENCOUNTER — Other Ambulatory Visit (HOSPITAL_COMMUNITY): Payer: Self-pay

## 2023-11-10 NOTE — Telephone Encounter (Signed)
 Per previous message in June (see older mychart message- Note Called the pt's husband and he states that he would like to see about a 2nd opinion/referral to [facility] for their memory clinic. He states that this process has been frustrating with trying to get her what she needs. He states that in the evening she is having some changes and worsening.    Advised at this time we could bring her in for a visit, repeat testing and re submit everything to be submitted for authorization for leqambi or kisunla whichever they would prefer. I had a available opening tomorrow and he states she does better in the afternoon. He would like for now referral for 2nd opinion to be sent. I have faxed a referral to [facility] clinic using the referral form that was online. I have sent her information pertaining to her health memory. Received confirmation     Our conversation was left off that he wanted 2nd opinion for neuro at [facility] which referral was sent. I have sent a mychart message as well asking about working pt in and informing of this information.

## 2023-11-10 NOTE — Telephone Encounter (Signed)
 Evenity  VOB initiated via [email or web address]  Last Evenity  inj: 11/09/23 Next Evenity  inj DUE: 12/09/23

## 2023-11-10 NOTE — Telephone Encounter (Signed)
 Patient husband, Wendy Serrano, patient needs an earlier appointment due to ringing in ear, right side of head fuzzy feeling. Patient is taking medication as prescribed. Would like a call back to discuss if she can be worked in and update on injection rejection.

## 2023-11-10 NOTE — Telephone Encounter (Signed)
 SABRA

## 2023-11-10 NOTE — Telephone Encounter (Addendum)
 Pt ready for scheduling for EVENITY  on or after : 12/09/23  Option# 1 Buy/Bill (Office supplied medication)  Out-of-pocket cost due at time of  office visit: $507  Number of injection/visits approved: 2  Primary: [employer] Evenity  co-insurance: 20% Admin fee co-insurance: 20%  Secondary: --- Evenity  co-insurance:  Admin fee co-insurance:   Medical Benefit Details: Date Benefits were checked: 11/10/23 Deductible: NO/ Coinsurance: 20%/ Admin Fee: 20%  Prior Auth: APPROVED PA# [id number]  Expiration Date: 06/29/23-05/11/24   # of doses approved:  ------------------------------------------------------------------------- Option# 2- Med Obtained from pharmacy  Pharmacy benefit: Copay $--- (Paid to pharmacy) Admin Fee: --- (Pay at clinic)  Prior Auth: --- PA# Expiration Date:   # of doses approved:  If patient wants fill through the pharmacy benefit please send prescription to: ---, and include estimated need by date in rx notes. Pharmacy will ship medication directly to the office.  Patient not eligible for Evenity  Copay Card. Copay Card can make patient's cost as little as $25. Link to apply: https://www.amgensupportplus.com/copay   This summary of benefits is an estimation of the patient's out-of-pocket cost. Exact cost may very based on individual plan coverage.

## 2023-11-10 NOTE — Telephone Encounter (Signed)
 Called [employer] at [phone] to check the cost. Per representative they cannot provide quotes. Provider should follow the fee schedule.

## 2023-11-11 ENCOUNTER — Other Ambulatory Visit: Payer: Self-pay | Admitting: Podiatry

## 2023-11-11 ENCOUNTER — Other Ambulatory Visit: Payer: Self-pay | Admitting: Dermatology

## 2023-11-11 ENCOUNTER — Other Ambulatory Visit: Payer: Self-pay | Admitting: Internal Medicine

## 2023-11-11 NOTE — Telephone Encounter (Signed)
 Patient requesting Lyrica , unable to pend

## 2023-11-11 NOTE — Telephone Encounter (Unsigned)
 Copied from CRM [id number]. Topic: Clinical - Medication Refill >> Nov 11, 2023  1:41 PM [place] E wrote: Medication: pregabalin  (LYRICA ) 25 MG capsule  Has the patient contacted their pharmacy? Yes (Agent: If no, request that the patient contact the pharmacy for the refill. If patient does not wish to contact the pharmacy document the reason why and proceed with request.) (Agent: If yes, when and what did the pharmacy advise?)  This is the patient's preferred pharmacy:  [id number] - [place], [place] - [address] AT [address] [facility] [place] [address] Phone: [phone] Fax: [phone]    Is this the correct pharmacy for this prescription? Yes If no, delete pharmacy and type the correct one.   Has the prescription been filled recently? No  Is the patient out of the medication? No, has a couple days left.  Has the patient been seen for an appointment in the last year OR does the patient have an upcoming appointment? Yes  Can we respond through MyChart? Yes  Agent: Please be advised that Rx refills may take up to 3 business days. We ask that you follow-up with your pharmacy.

## 2023-11-20 ENCOUNTER — Telehealth: Payer: Self-pay | Admitting: Internal Medicine

## 2023-11-20 NOTE — Telephone Encounter (Signed)
 Copied from CRM [id number]. Topic: Clinical - Prescription Issue >> Nov 20, 2023  1:23 PM Mia F wrote: Reason for CRM: Pt spouse is calling stating that he put in a refill request 10 days ago for pregabalin . Pt is out of medication. Please advise

## 2023-11-20 NOTE — Telephone Encounter (Signed)
 [facility] called and spoke to Jacalyn, [facility] about the refill(s) pregabalin  requested. Advised it was sent on 11/12/23 #60/5 refill(s). She says it was filled and picked up today at 3:37pm.    Copied from CRM [id number]. Topic: Clinical - Prescription Issue >> Nov 20, 2023  1:23 PM Mia F wrote: Reason for CRM: Pt spouse is calling stating that he put in a refill request 10 days ago for pregabalin . Pt is out of medication. Please advise

## 2023-11-23 ENCOUNTER — Other Ambulatory Visit (HOSPITAL_COMMUNITY): Payer: Self-pay

## 2023-11-23 ENCOUNTER — Ambulatory Visit: Payer: Medicare PPO | Admitting: Neurology

## 2023-11-23 ENCOUNTER — Ambulatory Visit: Admitting: Neurology

## 2023-11-23 ENCOUNTER — Telehealth: Payer: Self-pay | Admitting: Neurology

## 2023-11-23 ENCOUNTER — Encounter: Payer: Self-pay | Admitting: Neurology

## 2023-11-23 VITALS — BP 122/70 | HR 72 | Ht 68.0 in | Wt 131.6 lb

## 2023-11-23 DIAGNOSIS — G301 Alzheimer's disease with late onset: Secondary | ICD-10-CM | POA: Diagnosis not present

## 2023-11-23 DIAGNOSIS — F0280 Dementia in other diseases classified elsewhere without behavioral disturbance: Secondary | ICD-10-CM

## 2023-11-23 DIAGNOSIS — R4189 Other symptoms and signs involving cognitive functions and awareness: Secondary | ICD-10-CM | POA: Diagnosis not present

## 2023-11-23 MED ORDER — KISUNLA 350 MG/20ML IV SOLN: 700.0000 mg | INTRAVENOUS | 2 refills | Status: AC

## 2023-11-23 MED ORDER — RIVASTIGMINE 9.5 MG/24HR TD PT24: 9.5000 mg | MEDICATED_PATCH | Freq: Every day | TRANSDERMAL | 12 refills | Status: AC

## 2023-11-23 MED ORDER — MEMANTINE HCL 10 MG PO TABS: 10.0000 mg | ORAL_TABLET | Freq: Two times a day (BID) | ORAL | 3 refills | Status: DC

## 2023-11-23 NOTE — Progress Notes (Signed)
 Provider:  Dedra Gores, MD  Primary Care Physician:  Garald Karlynn GAILS, MD [address] [place] [address]     Referring Provider: Plotnikov, Karlynn GAILS, Md [address] [place],  [place] [address]          Chief Complaint according to patient   Patient presents with:                HISTORY OF PRESENT ILLNESS:  Wendy Serrano is a 78 y.o. female patient who is here for revisit 11/23/2023 for  evaluating if she is a possible candidate [place]. She has declined in the last 5 months during which we have waited for [facility] to allow treatment with Lequembi.   She scored today MMSE of 24/ 30 points. We used the WORLD spelling for this score.   She had side effect on Donepezil .  She stopped the medication for a week- cramps disappeared and she only tolerates Aricept  at 5 mg . We switched  to Exelon  patches at 4.5  , and can stop Aricept  and start  a higher dose of Exelon  which she tolerates better.   She asked about her delayed sleep time-  and I confirmed that she should go to bed  by midnight.  Currently sleeping from 2 AM to 10 AM.   Chief concern according to patient :  I want to start treatment.         Review of Systems: Out of a complete 14 system review, the patient complains of only the following symptoms, and all other reviewed systems are negative.:      11/23/2023    2:55 PM 07/09/2023   11:24 AM 03/24/2023   12:07 PM 03/13/2022   10:41 AM 09/11/2021    2:34 PM 04/29/2021   10:21 AM 11/01/2020   10:04 AM  MMSE - Mini Mental State Exam  Orientation to time 5 3 3 5 5 4 4   Orientation to Place 4 5 5 5 5 5 5   Registration 3 3 3 3 3 3 3   Attention/ Calculation 3 2 4 2 5 2 1   Recall 0 0 0 2 1 1 1   Language- name 2 objects 2 2 2 2 2 2 2   Language- repeat 1 1 1 1  0 0 1  Language- follow 3 step command 3 2 3 2 3 3 3   Language- read & follow direction 1 1 1 1 1 1 1   Write a sentence 1 1 1 1 1 1 1   Copy design 1 0 0 1 0 0 1  Total score  24 20 23 25 26 22 23           Social History   Socioeconomic History   Marital status: Married    Spouse name: [name]   Number of children: 0   Years of education: college   Highest education level: Master's degree (e.g., MA, MS, MEng, MEd, MSW, MBA)  Occupational History   Occupation: Retired  Tobacco Use   Smoking status: Never   Smokeless tobacco: Never  Vaping Use   Vaping status: Never Used  Substance and Sexual Activity   Alcohol use: No   Drug use: No   Sexual activity: Never    Birth control/protection: Post-menopausal  Other Topics Concern   Not on file  Social History Narrative   Les college BA; MEd-teaching;MEd Administration @ [employer]ried '67   Has 11 children who were foster care or mentoredRetiredSO retired,  Has had some health problems. They are doing well   Marriage is in good health and retirement is goodEnd-of-life: provided packet of information and forms for consideration (Oct'11)      Lives with husband   Right handed   Caffeine: zero   Social Drivers of Health   Financial Resource Strain: Low Risk  (09/08/2023)   Overall Financial Resource Strain (CARDIA)    Difficulty of Paying Living Expenses: Not hard at all  Food Insecurity: No Food Insecurity (09/08/2023)   Hunger Vital Sign    Worried About Running Out of Food in the Last Year: Never true    Ran Out of Food in the Last Year: Never true  Transportation Needs: No Transportation Needs (09/08/2023)   PRAPARE - Administrator, Civil Service (Medical): No    Lack of Transportation (Non-Medical): No  Physical Activity: Sufficiently Active (09/08/2023)   Exercise Vital Sign    Days of Exercise per Week: 3 days    Minutes of Exercise per Session: 60 min  Stress: No Stress Concern Present (09/08/2023)   [employer] of Occupational Health - Occupational Stress Questionnaire    Feeling of Stress : Only a little  Social Connections: Socially Integrated (09/08/2023)   Social  Connection and Isolation Panel    Frequency of Communication with Friends and Family: More than three times a week    Frequency of Social Gatherings with Friends and Family: Twice a week    Attends Religious Services: More than 4 times per year    Active Member of [employer] or Organizations: Yes    Attends Banker Meetings: Never    Marital Status: Married    Family History  Problem Relation Age of Onset   Prostate cancer Father        died   Pancreatic cancer Mother        died   Diabetes Mother    Hyperlipidemia Sister     Past Medical History:  Diagnosis Date   Allergic rhinitis    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   Menopausal symptoms    Renal cyst    bilateral   Rotator cuff tear    Yeast infection     Past Surgical History:  Procedure Laterality Date   ROTATOR CUFF REPAIR Left 08/2009   Left shoulder-rotator cuff repair [place])   WISDOM TOOTH EXTRACTION       Current Outpatient Medications on File Prior to Visit  Medication Sig Dispense Refill   aspirin 81 MG chewable tablet Chew 81 mg by mouth daily.     clobetasol  cream (TEMOVATE ) 0.05 % Apply topically 2 (two) times daily. Apply for 2 week 60 g 6   clotrimazole -betamethasone  (LOTRISONE ) cream APPLY TO AFFECTED AREA TWICE A DAY 60 g 1   donepezil  (ARICEPT ) 10 MG tablet TAKE 1/2 TABLET BY MOUTH AT BEDTIME FOR 1 MONTH,THEN TAKE 1 TABLET AT BEDTIME THEREAFTER 90 tablet 3   FOLIC ACID PO Take 4 tablets by mouth daily.     glucosamine-chondroitin 500-400 MG tablet  Take 1 tablet by mouth 2 (two) times daily.     ketoconazole  (NIZORAL ) 2 % cream  APPLY TO AFFECTED AREA TWICE A DAY 15 g 0   Lecanemab -irmb (LEQEMBI ) 200 MG/2ML SOLN IV therapy to start after PT scan, 2 mL 6   Magnesium 400 MG TABS Take 1 tablet by mouth daily.     memantine  (NAMENDA ) 10 MG tablet Take 1 tablet (10 mg total) by mouth 2 (two) times daily. 180 tablet 3   methotrexate (RHEUMATREX) 2.5 MG tablet 6 tablets on Saturday  3   pregabalin  (LYRICA ) 25 MG capsule TAKE 1 CAPSULE BY MOUTH TWICE A DAY 60 capsule 5   rivastigmine  (EXELON ) 4.6 mg/24hr Place 1 patch (4.6 mg total) onto the skin daily. 90 patch 3   Safety Seal Miscellaneous MISC Apply 1 Application topically at bedtime. Medication Name: Soft Heel Cream (Urea 40%, Salicylic Acid 12%) 30 g 10   SIMBRINZA 1-0.2 % SUSP Place 1 drop into both eyes 2 (two) times daily.  6   SODIUM FLUORIDE 5000 PPM 1.1 % PSTE as directed.     tacrolimus  (PROTOPIC ) 0.1 % ointment Apply topically 2 (two) times daily. Apply 2 x daily to palms for 2 weeks then stop. Alternate with Clotrimazole /Betamethasone  cream 100 g 2   traMADol  (ULTRAM ) 50 MG tablet Take 1 tablet (50 mg total) by mouth every 6 (six) hours as needed. 30 tablet 0   UNABLE TO FIND Take 2 capsules by mouth daily. Med Name: Lions mane     UNABLE TO FIND Take 2 capsules by mouth daily. Med Name: Kyolic- for cholesterol     Vitamin D -Vitamin K (VITAMIN K2-VITAMIN D3 PO) Take by mouth.     cyclobenzaprine  (FLEXERIL ) 5 MG tablet Take 1 tablet (5 mg total) by mouth 3 (three) times daily as needed. (Patient not taking: Reported on 11/23/2023) 40 tablet 1   mometasone  (ELOCON ) 0.1 % cream Use as directed to affected area twice per day as needed (Patient not taking: Reported on 11/23/2023) 45 g 1   prednisoLONE acetate (PRED FORTE) 1 % ophthalmic suspension Place 1 drop into both eyes daily.     predniSONE (DELTASONE) 10 MG tablet Take 6 tablets po x1 day, 5 tablets po x 1 day, 4 tablets po x 1 day, 3 tablets po x 1 day, 2 tablets po x 1 day, 1 tablet po x 1 day     Current  Facility-Administered Medications on File Prior to Visit  Medication Dose Route Frequency Provider Last Rate Last Admin   [START ON 12/09/2023] Romosozumab -aqqg (EVENITY ) 105 MG/1. injection 210 mg  210 mg Subcutaneous Q30 days Plotnikov, Aleksei V, MD        Allergies  Allergen Reactions   Fluorescein-Benoxinate Other (See Comments)    benoxinate / fluorescein     DIAGNOSTIC DATA (LABS, IMAGING, TESTING) - I reviewed patient records, labs, notes, testing and imaging myself where available.  Lab Results  Component Value Date   WBC 6.2 01/07/2023   HGB 13.2 01/07/2023   HCT 40.1 01/07/2023   MCV 95.8 01/07/2023   PLT 265.0 01/07/2023      Component Value Date/Time   NA 142 10/12/2023 1549   K 4.0 10/12/2023 1549   CL 108 10/12/2023 1549   CO2 29 10/12/2023 1549   GLUCOSE 94 10/12/2023 1549   BUN 23 10/12/2023 1549   CREATININE 0.88 10/12/2023 1549   CALCIUM 9.6 10/12/2023 1549   PROT 6.8 10/12/2023 1549   ALBUMIN 4.3 10/12/2023  1549   AST 31 10/12/2023 1549   ALT 29 10/12/2023 1549   ALKPHOS 70 10/12/2023 1549   BILITOT 0.6 10/12/2023 1549   GFRNONAA 111.51 02/19/2010 1100   GFRAA 81 02/02/2008 0955   Lab Results  Component Value Date   CHOL 208 (H) 08/22/2020   HDL 86.40 08/22/2020   LDLCALC 112 (H) 08/22/2020   LDLDIRECT 127.9 03/09/2013   TRIG 51.0 08/22/2020   CHOLHDL 2 08/22/2020   Lab Results  Component Value Date   HGBA1C 5.4 02/26/2011   Lab Results  Component Value Date   VITAMINB12 >1504 (H) 06/26/2021   Lab Results  Component Value Date   TSH 3.69 01/07/2023    PHYSICAL EXAM:  Vitals:   11/23/23 1442  BP: 122/70  Pulse: 72   No data found. Body mass index is 20.01 kg/m.   Wt Readings from Last 3 Encounters:  11/23/23 131 lb 9.6 oz (59.7 kg)  10/12/23 129 lb (58.5 kg)  09/08/23 133 lb (60.3 kg)     Ht Readings from Last 3 Encounters:  11/23/23 5' 8 (1.727 m)  10/12/23 5' 8 (1.727 m)  09/08/23 5' 8 (1.727 m)       General: The patient is awake, alert and appears not in acute distress. The patient is groomed. Head: Normocephalic, atraumatic.Cardiovascular:  Regular rate and cardiac rhythm by pulse, without distended neck veins. Respiratory: Lungs are clear to auscultation.  Skin:  Without evidence of ankle edema, or rash.     NEUROLOGIC EXAM: The patient is awake and alert, oriented to place and time.   Memory subjective described as impaired and progressed-\   Attention span & concentration ability appears normal.  Speech is fluent,  without  dysarthria, dysphonia or aphasia.  Mood and affect are appropriate.   Cranial nerves: no loss of smell or taste reported  Pupils are equal and briskly reactive to light. Extraocular movements in vertical and horizontal planes were intact and without nystagmus. No Diplopia. Visual fields by finger perimetry are intact. Hearing was intact to soft voice and finger rubbing.    Facial sensation intact to fine touch.  Facial motor strength is symmetric and tongue and uvula move midline.  Neck ROM : rotation, tilt and flexion extension were normal for age and shoulder shrug was symmetrical.    Motor exam:  Symmetric bulk, tone and ROM.   Normal tone without cog wheeling, symmetric grip strength .      ASSESSMENT AND PLAN :   78 y.o. year old female  here with:    1) Alzheimer's dementia,  ADL severely affected,  MMSE 23/ 30 .   2) HU[employer]NA denied her Lequembi .   3) I have now ordered KISUNLA , 3  infusions monthly at 700 mg, and then 1400 mg.   I stopped Aricept  and we use Exelon  patch at the next highest dose. Namenda  stays at 10 mg bid.   Positive AD biomarkers, positive PET scan and  MRI brain negative for  ARIA.    We leave the order for a research trial participation with [facility] and also with [facility] on record, I hope that these tertiary care centers can offer additional  information and treatment.   RV in 3-4 months    The patient's condition  requires frequent monitoring and adjustments in the treatment plan, reflecting the ongoing complexity of care.  This provider is the continuing focal point for all needed services for this condition.  After spending a total time of  37  minutes face to face and time for  history taking, physical and neurologic examination, review of laboratory studies,  personal review of imaging studies, reports and results of other testing and review of referral information / records as far as provided in visit,   Electronically signed by: Dedra Gores, MD 11/23/2023 3:07 PM  [facility] Board certified by The [employer] of Sleep Medicine and Diplomate of the [employer] of Sleep Medicine. Board certified In Neurology through the ABPN, Fellow of the [employer] of Neurology.

## 2023-11-23 NOTE — Addendum Note (Signed)
 Addended by: CHALICE SAUNAS on: 11/23/2023 03:44 PM   Modules accepted: Orders

## 2023-11-23 NOTE — Telephone Encounter (Signed)
Referral for neurology fax to [facility]. Phone: [phone], Fax: [phone]

## 2023-11-26 ENCOUNTER — Telehealth: Payer: Self-pay | Admitting: Neurology

## 2023-11-26 ENCOUNTER — Other Ambulatory Visit: Payer: Self-pay | Admitting: Neurology

## 2023-11-26 NOTE — Telephone Encounter (Signed)
 Received a message from phone staff stating the husband called wanting to speak with me. Pt husband requested to speak with  you  ,informed that I will deliver message states it was about conversation from yesterday Called the patient's husband back and was able to discuss concerns that she was started on the increase dose of the patch. States she feels out of sorts, not as with it. I was able to discuss with Dr Chalice and she said it may take a few days for her to get used to it but if it doesn't help, can always reduce back to the old dosage. He verbalized understanding.   He also was able to confirm neuro at [facility] received the referral and getting that processed

## 2023-11-26 NOTE — Telephone Encounter (Unsigned)
 Copied from CRM [id number]. Topic: Clinical - Medication Question >> Nov 26, 2023 12:07 PM Franky GRADE wrote: Reason for CRM: Patient's spouse is calling because they received a denial for the Evenity  injection. She is scheduled for an appointment on 12/09/2023 so they're a bit confused on what's going on.

## 2023-11-27 NOTE — Telephone Encounter (Unsigned)
 Copied from CRM [id number]. Topic: Clinical - Medication Prior Auth >> Nov 27, 2023 11:26 AM Viola F wrote: Reason for CRM: [employer] called regarding prior authorization for Evenity  Injection - patient is saying it was denied and she doesn't understand why. Per encounter from 11/10/23/[employer], patients prior auth was approved for only 2 visits and she had 5 visits since 06/13/23. Please call [employer] provider line at [phone]

## 2023-12-02 DIAGNOSIS — M1711 Unilateral primary osteoarthritis, right knee: Secondary | ICD-10-CM | POA: Diagnosis not present

## 2023-12-04 DIAGNOSIS — Z79899 Other long term (current) drug therapy: Secondary | ICD-10-CM | POA: Diagnosis not present

## 2023-12-04 DIAGNOSIS — H35372 Puckering of macula, left eye: Secondary | ICD-10-CM | POA: Diagnosis not present

## 2023-12-04 DIAGNOSIS — H209 Unspecified iridocyclitis: Secondary | ICD-10-CM | POA: Diagnosis not present

## 2023-12-04 DIAGNOSIS — H40043 Steroid responder, bilateral: Secondary | ICD-10-CM | POA: Diagnosis not present

## 2023-12-04 DIAGNOSIS — H35351 Cystoid macular degeneration, right eye: Secondary | ICD-10-CM | POA: Diagnosis not present

## 2023-12-04 DIAGNOSIS — H35363 Drusen (degenerative) of macula, bilateral: Secondary | ICD-10-CM | POA: Diagnosis not present

## 2023-12-04 DIAGNOSIS — F0390 Unspecified dementia without behavioral disturbance: Secondary | ICD-10-CM | POA: Diagnosis not present

## 2023-12-04 DIAGNOSIS — Z961 Presence of intraocular lens: Secondary | ICD-10-CM | POA: Diagnosis not present

## 2023-12-04 NOTE — Telephone Encounter (Addendum)
 [employer] no longer provides patient's cost. Per previous message, I called [employer] and they did not give me patient's estimated cost. Per representative, provider will have to follow the fee schedule. I provided Medicare guideline which is the 20%.   As far as the PA, medical (buy and bill) is approved from 06/29/23 to 05/11/24. PA was denied for pharmacy benefit (denial letter is in media for more information).

## 2023-12-06 DIAGNOSIS — M1711 Unilateral primary osteoarthritis, right knee: Secondary | ICD-10-CM | POA: Insufficient documentation

## 2023-12-07 DIAGNOSIS — H35351 Cystoid macular degeneration, right eye: Secondary | ICD-10-CM | POA: Diagnosis not present

## 2023-12-07 DIAGNOSIS — Z79899 Other long term (current) drug therapy: Secondary | ICD-10-CM | POA: Diagnosis not present

## 2023-12-07 DIAGNOSIS — H209 Unspecified iridocyclitis: Secondary | ICD-10-CM | POA: Diagnosis not present

## 2023-12-08 DIAGNOSIS — H6123 Impacted cerumen, bilateral: Secondary | ICD-10-CM | POA: Insufficient documentation

## 2023-12-08 DIAGNOSIS — H9312 Tinnitus, left ear: Secondary | ICD-10-CM | POA: Diagnosis not present

## 2023-12-09 ENCOUNTER — Ambulatory Visit

## 2023-12-09 NOTE — Telephone Encounter (Signed)
 Requesting benefit be rechecked for this patient for her Evenity .   Patient has received Evenity  injections since Feb 2025 for $40 copay and July it now has a $507 copay.  Patient is due for injection and we need understanding of increase cost to best explain to patient. Are insurance changes as of this month?

## 2023-12-11 NOTE — Telephone Encounter (Signed)
 PA team can only give an estimate of the patient's out-of-pocket cost. Exact cost may vary based on individual plan coverage. [employer] will not give us  the patient's cost for Prolia  or Evenity for Part B coverage. This estimate is following Medicare Advantage guidelines (20%).

## 2023-12-15 ENCOUNTER — Ambulatory Visit: Admitting: Internal Medicine

## 2023-12-15 ENCOUNTER — Telehealth: Payer: Self-pay

## 2023-12-15 ENCOUNTER — Telehealth: Payer: Self-pay | Admitting: Neurology

## 2023-12-15 NOTE — Telephone Encounter (Signed)
 [employer] [place]) If patient getting Donanemab-azbt  (KISUNLA ) 350 MG/20ML SOLN  administered at the office patient would only pay copay. If not then patient would pay prescription copay of $300 for 90 day supply, if monthly a $100.

## 2023-12-15 NOTE — Telephone Encounter (Signed)
 Pt is receiving the medication in the [facility] suite and they are the ones that process information through insurance. Although the pt is receiving the treatment in our office it is through [facility] and has to be billed through them.  I have made [facility] aware in case this is something else to be noted to them.

## 2023-12-15 NOTE — Telephone Encounter (Signed)
 Copied from CRM [id number]. Topic: Clinical - Medication Prior Auth >> Dec 15, 2023  1:40 PM Robinson H wrote: Reason for CRM: [employer] calling regarding issues with patients Evenity  injection, states patient was told by office she would have to pay out of pocket $600.00 dollars, insurance states on their end injection has been approved since 06/29/2023 and patient has approval on file until 12/31, please reach out.  [place] clinical team number [phone]

## 2023-12-15 NOTE — Telephone Encounter (Signed)
 Received call from below. Reached out was transferred 3 times before being told they were the wrong department.   Called [phone] and spoke with Levander - Call reference#: [id number]  Reviewed covered benefit for the J3111 and Auth> [id number]: 06/29/2023 - 05/11/2024 for 318 days unlimited.   $20 copay, $0 Admin  Follow Provider fee Schedule for additional cost.   Will meet with billing to learn more on that before reaching back out to patient.

## 2023-12-16 ENCOUNTER — Other Ambulatory Visit: Payer: Self-pay | Admitting: Neurology

## 2023-12-16 ENCOUNTER — Ambulatory Visit

## 2023-12-16 DIAGNOSIS — M810 Age-related osteoporosis without current pathological fracture: Secondary | ICD-10-CM | POA: Diagnosis not present

## 2023-12-16 DIAGNOSIS — F0280 Dementia in other diseases classified elsewhere without behavioral disturbance: Secondary | ICD-10-CM

## 2023-12-16 DIAGNOSIS — G309 Alzheimer's disease, unspecified: Secondary | ICD-10-CM

## 2023-12-16 DIAGNOSIS — F09 Unspecified mental disorder due to known physiological condition: Secondary | ICD-10-CM

## 2023-12-16 DIAGNOSIS — G3184 Mild cognitive impairment, so stated: Secondary | ICD-10-CM

## 2023-12-16 DIAGNOSIS — R413 Other amnesia: Secondary | ICD-10-CM

## 2023-12-16 DIAGNOSIS — R4189 Other symptoms and signs involving cognitive functions and awareness: Secondary | ICD-10-CM

## 2023-12-16 NOTE — Telephone Encounter (Signed)
 Spoke with Dr. Sebastian, patient husband okay per DPR.  In review with billing deparemtnet this morning patient claims have been paid in full and no copay due at time of visit. Patient cleared until 05/11/2024 for Evenity .  Patient scheduled for 220pm today 8/6 Evenity .

## 2023-12-16 NOTE — Telephone Encounter (Signed)
 Spoke with Lauraine from [facility] & Dr. Sebastian, pt's husband, on a conference call.   Tried to explain the issues with pt's copay for Evenity  injection, stating we had received 3 different copay amounts, including the info listed below from [place] stating pt's copay would be $20 plus the provider fee schedule.   Lauraine told me that the reason these claims were not filed correctly is because they are missing the office visit code, and [employer] can't see where the pt paid their portion because of the missing cpt code. I explained to Lauraine that we did not bill the pt for an OV because she did not see the provider, she saw a CMA for each of these visits, and the injections were signed off by the provider.   After speaking for an hour, Lauraine got her supervisor involved and they stated that they would be manually reviewing and reprocessing all the Evenity  claims from 2025 and that this process would be handled by their 'research team'.  Claims tickets are individual by dates and are listed as such:  6.30 - [id number] 5.29 - [id number] 4.28 - [id number] 3.28 - [id number] 2.27 - [id number]  Lauraine also stated that all future payments would be a $20 copay + the provider fee sch, and would be specific to the CMS J3111 code.  Spoke with Luke briefly and she stated she would call Dr. Sebastian to explain the next steps for the Evenity  injections and would help get them scheduled for the next shot.   Please call Dr. Sebastian before EOD.  Thank you.

## 2023-12-16 NOTE — Progress Notes (Addendum)
 Patient visits today to receive her EVENITY  injection/vaccine. Patient was informed and tolerated well. Patient was notified to reach out to us  if needed.   Patient is cleared for EVENITY  injection on 11/09/2023 per PA information on 11/11/2023. See notes Mentioned in provider note last on 10/12/23. Medication is CLINIC supplied. CoPay:$0  Medical screening examination/treatment/procedure(s) were performed by non-physician practitioner and as supervising physician I was immediately available for consultation/collaboration.  I agree with above. Karlynn Noel, MD

## 2023-12-17 ENCOUNTER — Telehealth: Payer: Self-pay | Admitting: Neurology

## 2023-12-17 NOTE — Telephone Encounter (Signed)
 Wendy Serrano: [id number] exp. 12/17/23-02/15/24 scheduled at [facility] 8/14.

## 2023-12-23 ENCOUNTER — Encounter: Payer: Self-pay | Admitting: Neurology

## 2023-12-24 ENCOUNTER — Ambulatory Visit (HOSPITAL_COMMUNITY)

## 2023-12-24 ENCOUNTER — Encounter (HOSPITAL_COMMUNITY): Payer: Self-pay

## 2023-12-31 MED ORDER — ROMOSOZUMAB-AQQG 105 MG/1.17ML ~~LOC~~ SOSY
210.0000 mg | PREFILLED_SYRINGE | SUBCUTANEOUS | Status: AC
  Administered 2024-01-13: 210 mg via SUBCUTANEOUS

## 2024-01-07 DIAGNOSIS — H9312 Tinnitus, left ear: Secondary | ICD-10-CM | POA: Diagnosis not present

## 2024-01-07 DIAGNOSIS — H918X3 Other specified hearing loss, bilateral: Secondary | ICD-10-CM | POA: Diagnosis not present

## 2024-01-07 DIAGNOSIS — H9313 Tinnitus, bilateral: Secondary | ICD-10-CM | POA: Diagnosis not present

## 2024-01-08 ENCOUNTER — Ambulatory Visit

## 2024-01-13 ENCOUNTER — Ambulatory Visit (INDEPENDENT_AMBULATORY_CARE_PROVIDER_SITE_OTHER)

## 2024-01-13 ENCOUNTER — Other Ambulatory Visit (HOSPITAL_COMMUNITY): Payer: Self-pay

## 2024-01-13 DIAGNOSIS — M810 Age-related osteoporosis without current pathological fracture: Secondary | ICD-10-CM | POA: Diagnosis not present

## 2024-01-13 MED ORDER — ROMOSOZUMAB-AQQG 105 MG/1.17ML ~~LOC~~ SOSY
210.0000 mg | PREFILLED_SYRINGE | Freq: Once | SUBCUTANEOUS | Status: AC
  Administered 2024-02-11: 210 mg via SUBCUTANEOUS

## 2024-01-13 NOTE — Progress Notes (Addendum)
 After obtaining consent, and per orders of Dr. Garald, injection of Evenity  given by Ronnald SHAUNNA Palms. Patient instructed to remain in clinic for 20 minutes afterwards, and to report any adverse reaction to me immediately.     Provider made aware of giving pt B12 and also pt was made aware of receiving this injection    Medical screening examination/treatment/procedure(s) were performed by non-physician practitioner and as supervising physician I was immediately available for consultation/collaboration.  I agree with above. Karlynn Garald, MD

## 2024-01-27 DIAGNOSIS — H40013 Open angle with borderline findings, low risk, bilateral: Secondary | ICD-10-CM | POA: Diagnosis not present

## 2024-02-02 DIAGNOSIS — L309 Dermatitis, unspecified: Secondary | ICD-10-CM | POA: Diagnosis not present

## 2024-02-03 DIAGNOSIS — L603 Nail dystrophy: Secondary | ICD-10-CM | POA: Diagnosis not present

## 2024-02-03 DIAGNOSIS — B353 Tinea pedis: Secondary | ICD-10-CM | POA: Diagnosis not present

## 2024-02-03 DIAGNOSIS — L2089 Other atopic dermatitis: Secondary | ICD-10-CM | POA: Diagnosis not present

## 2024-02-04 ENCOUNTER — Other Ambulatory Visit: Payer: Self-pay | Admitting: Internal Medicine

## 2024-02-04 MED ORDER — COVID-19 MRNA VAC-TRIS(PFIZER) 30 MCG/0.3ML IM SUSY: 0.3000 mL | PREFILLED_SYRINGE | Freq: Once | INTRAMUSCULAR | 0 refills | Status: AC

## 2024-02-08 ENCOUNTER — Ambulatory Visit

## 2024-02-09 ENCOUNTER — Telehealth: Payer: Self-pay | Admitting: Radiology

## 2024-02-09 NOTE — Telephone Encounter (Signed)
 Copied from CRM [id number]. Topic: Appointments - Appointment Cancel/Reschedule >> Feb 09, 2024  8:34 AM Timindy P wrote: Patient/patient representative is calling to cancel or reschedule an appointment. Mr. Ent is calling to reschedule the missed appt for the pt to receive the Evenity  injection. Mr. Dicocco can be reached at [phone]   ----------------------------------------------------------------------- From previous Reason for Contact - Appt Info/Confirm: Patient/patient representative is calling for information regarding an appointment.

## 2024-02-11 ENCOUNTER — Telehealth: Payer: Self-pay

## 2024-02-11 ENCOUNTER — Ambulatory Visit: Admitting: Neurology

## 2024-02-11 ENCOUNTER — Ambulatory Visit (INDEPENDENT_AMBULATORY_CARE_PROVIDER_SITE_OTHER)

## 2024-02-11 DIAGNOSIS — M810 Age-related osteoporosis without current pathological fracture: Secondary | ICD-10-CM

## 2024-02-11 MED ORDER — ROMOSOZUMAB-AQQG 105 MG/1.17ML ~~LOC~~ SOSY: 210.0000 mg | PREFILLED_SYRINGE | Freq: Once | SUBCUTANEOUS | Status: AC

## 2024-02-11 NOTE — Progress Notes (Cosign Needed Addendum)
 Patient seen in office for monthly Evenity  injections per Dr. Bettyann. Injections given in right arm and patient tolerated injections well.  Medical screening examination/treatment/procedure(s) were performed by non-physician practitioner and as supervising physician I was immediately available for consultation/collaboration.  I agree with above. Karlynn Noel, MD

## 2024-02-11 NOTE — Telephone Encounter (Signed)
 Evenity  VOB initiated via [email or web address]  Last Evenity  inj: 02/11/24 Next Evenity  inj DUE: 03/13/24

## 2024-02-15 ENCOUNTER — Other Ambulatory Visit (HOSPITAL_COMMUNITY): Payer: Self-pay

## 2024-02-15 NOTE — Telephone Encounter (Signed)
 Wendy Serrano

## 2024-02-15 NOTE — Telephone Encounter (Signed)
 Pt ready for scheduling for PROLIA on or after : 03/13/24  Option# 1 Buy/Bill (Office supplied medication)  Out-of-pocket cost due at time of  office visit: $522.41  Number of injection/visits approved: ---  Primary: [employer]MED[employer]ARE Evenity  co-insurance: 20% Admin fee co-insurance: $40  Secondary: --- Evenity  co-insurance:  Admin fee co-insurance:   Medical Benefit Details: Date Benefits were checked: 02/13/24 Deductible: NO/ Coinsurance: 20%/ Admin Fee: $40  Prior Auth: APPROVED PA# [id number]  Expiration Date: 06/29/23-05/11/24  # of doses approved: --- ------------------------------------------------------------------------- Option# 2- Med Obtained from pharmacy  Pharmacy benefit: Copay $--- (Paid to pharmacy) Admin Fee: --- (Pay at clinic)  Prior Auth: --- PA# Expiration Date:   # of doses approved:  If patient wants fill through the pharmacy benefit please send prescription to: ---, and include estimated need by date in rx notes. Pharmacy will ship medication directly to the office.  Patient NOT eligible for Evenity  Copay Card. Copay Card can make patient's cost as little as $25. Link to apply: [email or web address]://[email or web address]   This summary of benefits is an estimation of the patient's out-of-pocket cost. Exact cost may very based on individual plan coverage.

## 2024-02-18 ENCOUNTER — Encounter: Payer: Self-pay | Admitting: Dermatology

## 2024-02-18 ENCOUNTER — Ambulatory Visit: Admitting: Dermatology

## 2024-02-18 VITALS — BP 145/96 | HR 75

## 2024-02-18 DIAGNOSIS — R21 Rash and other nonspecific skin eruption: Secondary | ICD-10-CM | POA: Diagnosis not present

## 2024-02-18 DIAGNOSIS — L309 Dermatitis, unspecified: Secondary | ICD-10-CM

## 2024-02-18 DIAGNOSIS — L308 Other specified dermatitis: Secondary | ICD-10-CM

## 2024-02-18 MED ORDER — CLOBETASOL PROPIONATE 0.05 % EX OINT: 1.0000 | TOPICAL_OINTMENT | Freq: Two times a day (BID) | CUTANEOUS | 9 refills | Status: AC

## 2024-02-18 NOTE — Patient Instructions (Addendum)
 VISIT SUMMARY:  Today, we discussed the persistent skin lesions on your hands and feet that you have been experiencing for over a year. We reviewed your current treatment plan and made some adjustments to help improve your condition.  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 To better understand your condition, we will perform a biopsy on your hand to determine if it is psoriasis or eczema.  Continue using Opzelura cream twice daily for 2 weeks then STOP and Alternate with Clobetasol  ointment 2 times a day for 2 weeks then STOP.  Start using a compounded urea cream from [facility] at night (IF A REFILL IS NEEDED CALL THEM DIRECTLY AT [phone]).  Use a good hand moisturizer, like [employer] [place] hand cream, frequently throughout the day. Avoid using excessive amounts of Opzelura; use a pea-sized amount per application. Wear gloves when washing dishes or cleaning to prevent flares.  INSTRUCTIONS:  Please schedule a follow-up appointment in 3-4 weeks to review the biopsy results and assess your response to the treatment.  Patient Handout: Wound Care for Skin Biopsy Site  Taking Care of Your Skin Biopsy Site  Proper care of the biopsy site is essential for promoting healing and minimizing scarring. This handout provides instructions on how to care for your biopsy site to ensure optimal recovery.  1. Cleaning the Wound:  Clean the biopsy site daily with gentle soap and water. Gently pat the area dry with a clean, soft towel. Avoid harsh scrubbing or rubbing the area, as this can irritate the skin and delay healing.  2. Applying Aquaphor and Bandage:  After cleaning the wound, apply a thin layer of Aquaphor ointment to the biopsy site. Cover the area with a sterile bandage to protect it from dirt, bacteria, and  friction. Change the bandage daily or as needed if it becomes soiled or wet.  3. Continued Care for One Week:  Repeat the cleaning, Aquaphor application, and bandaging process daily for one week following the biopsy procedure. Keeping the wound clean and moist during this initial healing period will help prevent infection and promote optimal healing.  4. Massaging Aquaphor into the Area:  ---After one week, discontinue the use of bandages but continue to apply Aquaphor to the biopsy site. ----Gently massage the Aquaphor into the area using circular motions. ---Massaging the skin helps to promote circulation and prevent the formation of scar tissue.   Additional Tips:  Avoid exposing the biopsy site to direct sunlight during the healing process, as this can cause hyperpigmentation or worsen scarring. If you experience any signs of infection, such as increased redness, swelling, warmth, or drainage from the wound, contact your healthcare provider immediately. Follow any additional instructions provided by your healthcare provider for caring for the biopsy site and managing any discomfort. Conclusion:  Taking proper care of your skin biopsy site is crucial for ensuring optimal healing and minimizing scarring. By following these instructions for cleaning, applying Aquaphor, and massaging the area, you can promote a smooth and successful recovery. If you have any questions or concerns about caring for your biopsy site, don't hesitate to contact your healthcare provider for guidance.     Important Information  Due to recent changes in healthcare laws, you may see results of your pathology and/or laboratory studies on MyChart before the doctors have had a chance to review them. We  understand that in some cases there may be results that are confusing or concerning to you. Please understand that not all results are received at the same time and often the doctors may need to interpret multiple  results in order to provide you with the best plan of care or course of treatment. Therefore, we ask that you please give us  2 business days to thoroughly review all your results before contacting the office for clarification. Should we see a critical lab result, you will be contacted sooner.   If You Need Anything After Your Visit  If you have any questions or concerns for your doctor, please call our main line at [phone] If no one answers, please leave a voicemail as directed and we will return your call as soon as possible. Messages left after 4 pm will be answered the following business day.   You may also send us  a message via MyChart. We typically respond to MyChart messages within 1-2 business days.  For prescription refills, please ask your pharmacy to contact our office. Our fax number is [phone].  If you have an urgent issue when the clinic is closed that cannot wait until the next business day, you can page your doctor at the number below.    Please note that while we do our best to be available for urgent issues outside of office hours, we are not available 24/7.   If you have an urgent issue and are unable to reach us , you may choose to seek medical care at your doctor's office, retail clinic, urgent care center, or emergency room.  If you have a medical emergency, please immediately call 911 or go to the emergency department. In the event of inclement weather, please call our main line at [phone] for an update on the status of any delays or closures.  Dermatology Medication Tips: Please keep the boxes that topical medications come in in order to help keep track of the instructions about where and how to use these. Pharmacies typically print the medication instructions only on the boxes and not directly on the medication tubes.   If your medication is too expensive, please contact our office at [phone] or send us  a message through MyChart.   We are unable to  tell what your co-pay for medications will be in advance as this is different depending on your insurance coverage. However, we may be able to find a substitute medication at lower cost or fill out paperwork to get insurance to cover a needed medication.   If a prior authorization is required to get your medication covered by your insurance company, please allow us  1-2 business days to complete this process.  Drug prices often vary depending on where the prescription is filled and some pharmacies may offer cheaper prices.  The website www.goodrx.com contains coupons for medications through different pharmacies. The prices here do not account for what the cost may be with help from insurance (it may be cheaper with your insurance), but the website can give you the price if you did not use any insurance.  - You can print the associated coupon and take it with your prescription to the pharmacy.  - You may also stop by our office during regular business hours and pick up a GoodRx coupon card.  - If you need your prescription sent electronically to a different pharmacy, notify our office through [facility] or by phone at [phone]

## 2024-02-18 NOTE — Telephone Encounter (Signed)
 The evenity  injection has since been rescheduled for 11/06

## 2024-02-18 NOTE — Progress Notes (Signed)
 Follow-Up Visit   Subjective  Wendy Serrano is a 78 y.o. female who presents for the following: Tinea Pedis   - ?Tinea Pedis.  Patient was last evaluated on 10/12/2023. At this visit patient was prescribed Soft Heal Cream (40% urea, 12% salicylic acid). Patient was recommended to use a pumice stone after using cream for 3 weeks. Patient reports sxs are worse. Patient states that 1 month after this appt he tried to get her in here to be seen because the feet were worst but wasn't able to do so. He then took here to [facility]. They completed a nail culture which did not show fungus but they prescribed Terbinafine  tablets for patient to take.He reports patient has been on the tablets for about 2 weeks now and they have noticed any improvement. They were also recommended to apply clobetasol  for 2 weeks then stop and alternate with Opzelura for 2 weeks. This has not helped either. He states the patient has not voiced that the areas are itchy. They are just peeling severely. Patient reports that she would sometimes use a pumice stone but would go for pedicures once a month. Patient reports they used the Soft Heal cream for about a month until cream ran out. He states he was unable to get in contact with [facility] to obtain refills. (Patient has 8 additional refills on file) Patient reports they are using an otc urea cream that has been ineffective.   - Eczema of the hands.  Patient was last evaluated on 10/12/2023. At this visit the patient was recommended to continue with clobetasol  and to start use of a cream containing urea. Patient was recommended to maintain sunscreen use daily. Dupixent was also discussed at this visit. Patient reports that the clobetasol  and urea cream seemed to work but then stopped and she still has flares that will not go away. Patient reports she is using urea daily but alternating clobetasol  (2 weeks on and 2 weeks off) alternating with Opzelura (2 weeks on and  2 weeks off). He husband reports the Opzelura is $2000 as per EOB but paid $100.   It's suspected that the thick peeling skin of her hand and feet are both related.  Pt declined a skin biopsy on all visits with Dr Alm.  Is suspected that this is either psoriasis or eczema however, if systemic treatment is the next step in tx then a bx must be done to confirm dx.  The following portions of the chart were reviewed this encounter and updated as appropriate: medications, allergies, medical history  Review of Systems:  No other skin or systemic complaints except as noted in HPI or Assessment and Plan.  Objective  Well appearing patient in no apparent distress; mood and affect are within normal limits.  A focused examination was performed of the following areas: Feet and Hands  Relevant exam findings are noted in the Assessment and Plan.                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 Differential diagnosis includes psoriasis and eczema. Previous treatments with topical agents, including Opzelura, have been ineffective. The condition is characterized by thickened skin, peeling, and no significant itching. The thick skin on the hands and feet  reduces the efficacy of topical treatments.  - Perform a biopsy on the hand to differentiate between psoriasis and eczema to guide systemic treatment options. - Continue using Opzelura twice daily. - Use compounded urea cream at night in combination with Opzelura. - Apply a thin layer of Opzelura (one pea size per location)  followed by a layer of urea cream at night. - Use a good hand moisturizer, such as [employer] [place] hand cream, frequently throughout the day. - Avoid using excessive amounts of  Opzelura; use a pea-sized amount per application. - Wear gloves when washing dishes or cleaning to prevent flares. - Schedule follow-up in 3-4 weeks to review biopsy results and assess response to treatment.  Pre-visit planning reviewing the last office visit, labs, imaging and care everywhere when applicable was 8 minutes Intra-visit was 35 minutes and included updating the relevant history, performing a video or in-person physical exam as appropriate, creating a treatment plan and used shared decision making with the patient.  Post-visit was 8 minutes that encompassed note completion, placing of orders, updating patient instructions and coordination of care.  RASH Right Hand - Anterior Epidermal / dermal shaving - Right Hand - Anterior  Lesion diameter (cm):  0.4 Informed consent: discussed and consent obtained   Timeout: patient name, date of birth, surgical site, and procedure verified   Procedure prep:  Patient was prepped and draped in usual sterile fashion Prep type:  Isopropyl alcohol Anesthesia: the lesion was anesthetized in a standard fashion   Anesthetic:  1% lidocaine  w/ epinephrine  1-100,000 buffered w/ 8.4% NaHCO3 Instrument used: DermaBlade   Hemostasis achieved with: aluminum chloride   Outcome: patient tolerated procedure well   Post-procedure details: sterile dressing applied and wound care instructions given   Dressing type: petrolatum   Additional details:  Pt aware that benign results will be sent to mychart and the staff will call abnormal results will  Specimen 1 - Surgical pathology Differential Diagnosis: R/O Eczema vs Psoriasis  Check Margins: No DERMATITIS   Related Medications clobetasol  ointment (TEMOVATE ) 0.05 % Apply 1 Application topically 2 (two) times daily. Apply 2 times daily for 2 weeks then STOP and take a break for 2 weeks. While taking a 2 week break apply Pea sized amount of Opzelura 2 times Daily.  Return in about 4 weeks (around  03/17/2024) for Eczema F/U.  I, Jetta Ager, am acting as Neurosurgeon for [employer], DO.   Documentation: I have reviewed the above documentation for accuracy and completeness, and I agree with the above.  Delon Lenis, DO

## 2024-02-19 LAB — SURGICAL PATHOLOGY

## 2024-02-22 ENCOUNTER — Ambulatory Visit: Payer: Self-pay | Admitting: Dermatology

## 2024-02-22 NOTE — Progress Notes (Signed)
 Hi Jetta, Pt's biopsy was positive for atopic dermatitis.  Let's initiate the process for dupixent.  Please call pt and schedule them for n 8:15 am injection training with samples and we will sign the paperwork and submit the full rx at that visit too.   -Dr Alm

## 2024-02-24 NOTE — Telephone Encounter (Signed)
-----   Message from Delon Lenis sent at 02/22/2024 11:41 AM EDT ----- Wendy Serrano, Pt's biopsy was positive for atopic dermatitis.  Let's initiate the process for dupixent.  Please call pt and schedule them for n 8:15 am injection training with samples and we will sign the  paperwork and submit the full rx at that visit too.   -Dr Lenis ----- Message ----- From: Interface, Lab In Three Zero One Sent: 02/19/2024   4:36 PM EDT To: Delon LOISE Lenis, DO

## 2024-02-24 NOTE — Telephone Encounter (Signed)
 LVMTCB to confirm if Mrs and MR. Barrett would like to proceed with the Dupixent. Also sent a My Chart Message.

## 2024-02-29 ENCOUNTER — Other Ambulatory Visit: Payer: Self-pay

## 2024-02-29 DIAGNOSIS — L209 Atopic dermatitis, unspecified: Secondary | ICD-10-CM

## 2024-02-29 MED ORDER — DUPIXENT 300 MG/2ML ~~LOC~~ SOAJ: 300.0000 mg | SUBCUTANEOUS | 11 refills | Status: AC

## 2024-03-08 ENCOUNTER — Other Ambulatory Visit: Payer: Self-pay

## 2024-03-08 MED ORDER — OPZELURA 1.5 % EX CREA: TOPICAL_CREAM | CUTANEOUS | 2 refills | Status: DC

## 2024-03-08 NOTE — Progress Notes (Signed)
 [facility] called on patients behalf stating pt has switched pharmacies and wished to have her Opzelura 1.5% topical filled by them and requesting a new script.   Rx has been sent.

## 2024-03-09 ENCOUNTER — Ambulatory Visit

## 2024-03-11 DIAGNOSIS — M25561 Pain in right knee: Secondary | ICD-10-CM | POA: Diagnosis not present

## 2024-03-11 DIAGNOSIS — M1711 Unilateral primary osteoarthritis, right knee: Secondary | ICD-10-CM | POA: Diagnosis not present

## 2024-03-17 ENCOUNTER — Encounter: Payer: Self-pay | Admitting: Dermatology

## 2024-03-17 ENCOUNTER — Ambulatory Visit: Admitting: Dermatology

## 2024-03-17 ENCOUNTER — Ambulatory Visit (INDEPENDENT_AMBULATORY_CARE_PROVIDER_SITE_OTHER)

## 2024-03-17 DIAGNOSIS — M810 Age-related osteoporosis without current pathological fracture: Secondary | ICD-10-CM

## 2024-03-17 DIAGNOSIS — L84 Corns and callosities: Secondary | ICD-10-CM | POA: Diagnosis not present

## 2024-03-17 DIAGNOSIS — L309 Dermatitis, unspecified: Secondary | ICD-10-CM | POA: Diagnosis not present

## 2024-03-17 NOTE — Progress Notes (Cosign Needed Addendum)
 After obtaining consent, and per orders of Dr. Garald, injection of Evenity  given by Edsel CHRISTELLA Kerns. Patient tolerated well.  Medical screening examination/treatment/procedure(s) were performed by non-physician practitioner and as supervising physician I was immediately available for consultation/collaboration.  I agree with above. Karlynn Garald, MD

## 2024-03-17 NOTE — Progress Notes (Signed)
   Follow-Up Visit   Subjective  Wendy Serrano is a 78 y.o. female who presents for the following: Biopsy results  [name] had an appointment with us  on 02/18/24 for a chronic rash on her hands and feet. 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 She was to use Opzelura twice daily, compound urea cream at night in combination with Opzelura, and continue good hand moisturizer. She is using the creams which has helped a lot.   C/o the biopsy site is dry and hard. Denies itchiness.   The following portions of the chart were reviewed this encounter and updated as appropriate: medications, allergies, medical history  Review of Systems:  No other skin or systemic complaints except as noted in HPI or Assessment and Plan.  Objective  Well appearing patient in no apparent distress; mood and affect are within normal limits.  A focused examination was performed of the following areas: Bilateral hands  Relevant exam findings are noted in the Assessment and Plan. FINAL DIAGNOSIS and MICROSCOPIC DESCRIPTION Diagnosis Skin , right hand - anterior CHRONIC SPONGIOTIC DERMATITIS, SEE DESCRIPTION Microscopic Description There is acanthosis with foci of slight spongiosis and parakeratosis. An infiltrate composed predominantly of lymphocytes is present around the superficial vascular plexus. A PAS stain is negative for fungi. The findings are most consistent with a chronic eczematous dermatitis such as contact, nummular or atopic dermatitis.   Assessment & Plan   Eczema involving hands and feet Eczema confirmed by biopsy, not psoriasis. Significant improvement with Opzelura cream, with hands 90% clear. Feet show improvement but have thickened skin and calluses. Dupixent injection discussed as a long-term treatment option to reduce inflammation and eliminate the need for daily topical creams. Dupixent is a protein injection  administered every two weeks, with expected improvement every four weeks and full clearance in four to six months. Risks include rare keratoconjunctivitis, especially in those with chronic dry eye. Dupixent does not suppress the immune system and does not require TB screenings or lab monitoring. - Continue Opzelura cream daily. - Discussed Dupixent injections and gave training session but pt deferred starting today.  She would like more time to think about starting this medication.  - Advised to contact office if she decides to start this medicaiton  Calluses of feet Thickened skin and calluses on feet, contributing to delayed healing. Improvement noted with Opzelura and urea cream. Pedicure recommended to remove dead skin and calluses. - Continue urea cream application on feet. - Schedule pedicure to remove dead skin and calluses.    No follow-ups on file.  I, Gordan Beams, CMA, am acting as scribe for [employer], DO.   Documentation: I have reviewed the above documentation for accuracy and completeness, and I agree with the above.  Delon Lenis, DO

## 2024-03-27 ENCOUNTER — Encounter: Payer: Self-pay | Admitting: Dermatology

## 2024-03-28 ENCOUNTER — Telehealth: Admitting: Emergency Medicine

## 2024-03-28 DIAGNOSIS — H40013 Open angle with borderline findings, low risk, bilateral: Secondary | ICD-10-CM | POA: Diagnosis not present

## 2024-03-28 DIAGNOSIS — H40043 Steroid responder, bilateral: Secondary | ICD-10-CM | POA: Diagnosis not present

## 2024-03-28 DIAGNOSIS — L309 Dermatitis, unspecified: Secondary | ICD-10-CM

## 2024-03-28 NOTE — Progress Notes (Signed)
 Virtual Visit Consent   Wendy Serrano, you are scheduled for a virtual visit with a [facility] provider today. Just as with appointments in the office, your consent must be obtained to participate. Your consent will be active for this visit and any virtual visit you may have with one of our providers in the next 365 days. If you have a MyChart account, a copy of this consent can be sent to you electronically.  As this is a virtual visit, video technology does not allow for your provider to perform a traditional examination. This may limit your provider's ability to fully assess your condition. If your provider identifies any concerns that need to be evaluated in person or the need to arrange testing (such as labs, EKG, etc.), we will make arrangements to do so. Although advances in technology are sophisticated, we cannot ensure that it will always work on either your end or our end. If the connection with a video visit is poor, the visit may have to be switched to a telephone visit. With either a video or telephone visit, we are not always able to ensure that we have a secure connection.  By engaging in this virtual visit, you consent to the provision of healthcare and authorize for your insurance to be billed (if applicable) for the services provided during this visit. Depending on your insurance coverage, you may receive a charge related to this service.  I need to obtain your verbal consent now. Are you willing to proceed with your visit today? [name] has provided verbal consent on 03/28/2024 for a virtual visit (video or telephone). Jon CHRISTELLA Belt, NP  Date: 03/28/2024 8:19 AM   Virtual Visit via Video Note   I, Jon CHRISTELLA Belt, connected with  Lekisha Mcghee  ([id number], 1945/11/09) on 03/28/24 at  8:15 AM EST by a video-enabled telemedicine application and verified that I am speaking with the correct person using two identifiers.  Location: Patient: Virtual Visit Location  Patient: Home Provider: Virtual Visit Location Provider: Home Office   I discussed the limitations of evaluation and management by telemedicine and the availability of in person appointments. The patient expressed understanding and agreed to proceed.    History of Present Illness: Wendy Serrano is a 78 y.o. who identifies as a female who was assigned female at birth, and is being seen today for questions about hand eczema and use of opzelura on open skin as the skin on her hands has cracked. See pt message in mychart for details on concerns. I explained I am virtual urgent care and not a part of Dr. Lyndee team with dermatology. I think pt was trying to get appt with someone on the derm team. Husband is present and is primary historian.   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   Cervical radiculopathy 07/04/2023   Upper back pain on left side 06/09/2023   Hand dermatitis 06/09/2023   Late onset Alzheimer's dementia without behavioral disturbance, psychotic disturbance, mood disturbance, or anxiety (HCC) 11/26/2022   Abnormal serum thyroxine (T4) level 07/25/2022   Rash 07/09/2022   Abnormal cervical Papanicolaou smear 04/14/2022   History of dysmenorrhea 04/14/2022   Leg wound, right 04/14/2022   Pseudophakia of both eyes 03/28/2022   MCI (mild cognitive impairment) 03/13/2022   Foot pain, right 03/06/2022   Vascular insufficiency of limb 02/03/2022  Cramps, muscle, general 11/04/2021   Atrophic vaginitis 09/04/2021   Menopausal symptom 09/04/2021   At low risk for open-angle glaucoma in both eyes 07/19/2021   Pain of left lower extremity 07/04/2021   Vitamin D  deficiency 06/30/2021   Dysesthesia of scalp 04/29/2021   History of COVID-19 04/29/2021   Neuralgia involving scalp 03/28/2021   Headache 03/12/2021   Ear canal  abrasion, right, initial encounter 03/01/2021   GERD (gastroesophageal reflux disease) 01/24/2021   Leg cramps 01/24/2021   Atherosclerosis of aorta 10/11/2020   Foot pain, bilateral 05/14/2020   Neck pain 05/11/2020   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 04/20/2020   Asymptomatic varicose veins of both lower extremities 07/07/2019   Hyperpigmentation 07/06/2019   Rhytides 07/06/2019   Mild cognitive disorder 06/16/2019   Dementia (HCC) 02/14/2019   Ear pain, left 08/12/2018   Sensorineural hearing loss (SNHL), bilateral 01/04/2018   Vertigo 11/19/2017   CME (cystoid macular edema), right 08/28/2017   Stress fracture of metatarsal bone of left foot 07/31/2017   Uveitis 06/06/2016   Nuclear sclerosis, bilateral 04/08/2016   Knee pain, bilateral 08/23/2015   High risk medication use 07/24/2015   Puckering of macula, left eye 07/24/2015   Drusen (degenerative) of macula, bilateral 12/15/2014   Iridocyclitis, 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   Nuclear cataract of both eyes 11/15/2014   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/15/2014   Steroid responder, bilateral 11/15/2014   Tinnitus 05/24/2013   Abdominal pain 04/20/2013   Dense breast tissue 12/10/2012   Elevated serum cholesterol 12/25/2011   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   Encounter for general adult medical examination without abnormal findings 03/01/2011   Osteoporosis 02/19/2010   UNSPECIFIED PERIPHERAL VASCULAR DISEASE 11/29/2009   Allergic rhinitis 05/12/2007   Herpesviral infection, unspecified 01/25/2007    Allergies:  Allergies  Allergen Reactions   Fluorescein-Benoxinate Other (See Comments)    benoxinate / fluorescein   Medications:  Current Outpatient Medications:    amantadine (SYMMETREL) 100 MG capsule, Take 100 mg by mouth., Disp: , Rfl:    aspirin 81 MG chewable tablet, Chew 81 mg by mouth daily., Disp: , Rfl:    clobetasol  ointment (TEMOVATE ) 0.05 %, Apply 1 Application topically 2  (two) times daily. Apply 2 times daily for 2 weeks then STOP and take a break for 2 weeks. While taking a 2 week break apply Pea sized amount of Opzelura 2 times Daily., Disp: 60 g, Rfl: 9   cyclobenzaprine  (FLEXERIL ) 5 MG tablet, Take 5 mg by mouth., Disp: , Rfl:    Difluprednate 0.05 % EMUL, Apply 1 drop to eye., Disp: , Rfl:    Donanemab-azbt  (KISUNLA ) 350 MG/20ML SOLN, Inject 700 mg into the vein every 30 (thirty) days. After third infusion ,the dose will be 1400 mg, Disp: 40 mL, Rfl: 2   donepezil  (ARICEPT ) 10 MG tablet, Take 10 mg by mouth., Disp: , Rfl:    Dupilumab (DUPIXENT) 300 MG/2ML SOAJ, Inject 300 mg into the skin every 14 (fourteen) days. Starting at day 15 for maintenance., Disp: 4 mL, Rfl: 11   famotidine  (PEPCID ) 40 MG tablet, Take 40 mg by mouth., Disp: , Rfl:    FOLIC ACID PO, Take 4 tablets by mouth daily., Disp: , Rfl:    gabapentin  (NEURONTIN ) 100 MG capsule, Take 100 mg by mouth., Disp: , Rfl:    glucosamine-chondroitin 500-400 MG tablet, Take 1 tablet by mouth 2 (two) times daily., Disp: , Rfl:    ketoconazole  (NIZORAL ) 2 % cream,  APPLY TO AFFECTED AREA TWICE A DAY, Disp: 15 g, Rfl: 0   Magnesium 400 MG TABS, Take 1 tablet by mouth daily., Disp: , Rfl:    meclizine  (ANTIVERT ) 12.5 MG tablet, TAKE 1 TABLET BY MOUTH 3 TIMES DAILY AS NEEDED FOR DIZZINESS., Disp: , Rfl:    memantine  (NAMENDA ) 10 MG tablet, Take 1 tablet (10 mg total) by mouth 2 (two) times daily., Disp: 180 tablet, Rfl: 3   methotrexate (RHEUMATREX) 2.5 MG tablet, 6 tablets on Saturday, Disp: , Rfl: 3   OPZELURA 1.5 % CREA, Apply to affected areas of the body BID., Disp: 60 g, Rfl: 2   prednisoLONE acetate (PRED FORTE) 1 % ophthalmic suspension, PLACE 1 DROP INTO BOTH EYES ONCE DAILY, Disp: , Rfl:    predniSONE (DELTASONE) 10 MG tablet, Take 6 tablets po x1 day, 5 tablets po x 1 day, 4 tablets po x 1 day, 3 tablets po x 1 day, 2 tablets po x 1 day, 1 tablet po x 1 day, Disp: , Rfl:    pregabalin  (LYRICA ) 25 MG  capsule, TAKE 1 CAPSULE BY MOUTH TWICE A DAY, Disp: 60 capsule, Rfl: 5   rivastigmine  (EXELON ) 9.5 mg/24hr, Place 1 patch (9.5 mg total) onto the skin daily., Disp: 30 patch, Rfl: 12   Safety Seal Miscellaneous MISC, Apply 1 Application topically at bedtime. Medication Name: Soft Heel Cream (Urea 40%, Salicylic Acid 12%), Disp: 30 g, Rfl: 10   SIMBRINZA 1-0.2 % SUSP, Place 1 drop into both eyes 2 (two) times daily., Disp: , Rfl: 6   SODIUM FLUORIDE 5000 PPM 1.1 % PSTE, as directed., Disp: , Rfl:    tacrolimus  (PROTOPIC ) 0.1 % ointment, Apply topically 2 (two) times daily. Apply 2 x daily to palms for 2 weeks then stop. Alternate with Clotrimazole /Betamethasone  cream, Disp: 100 g, Rfl: 2   terbinafine  (LAMISIL ) 250 MG tablet, Take 250 mg by mouth daily., Disp: , Rfl:    traMADol  (ULTRAM ) 50 MG tablet, Take 1 tablet (50 mg total) by mouth every 6 (six) hours as needed., Disp: 30 tablet, Rfl: 0   UNABLE TO FIND, Take 2 capsules by mouth daily. Med Name: Lions mane, Disp: , Rfl:    UNABLE TO FIND, Take 2 capsules by mouth daily. Med Name: Kyolic- for cholesterol, Disp: , Rfl:    Vitamin D -Vitamin K (VITAMIN K2-VITAMIN D3 PO), Take by mouth., Disp: , Rfl:   Current Facility-Administered Medications:    Romosozumab -aqqg (EVENITY ) 105 MG/1. injection 210 mg, 210 mg, Subcutaneous, Q30 days, Plotnikov, Aleksei V, MD, 210 mg at 03/17/24 1053   Romosozumab -aqqg (EVENITY ) 105 MG/1. injection 210 mg, 210 mg, Subcutaneous, Once, Plotnikov, Aleksei V, MD  Observations/Objective: Patient is well-developed, well-nourished in no acute distress.  Resting comfortably  at home.  Head is normocephalic, atraumatic.  No labored breathing.  Speech is clear and coherent with logical content.  Patient is alert and oriented at baseline.    Assessment and Plan: 1. Eczema, unspecified type (Primary)  Advised pt it's ok to use aquaphor or vaseline on hands and that I do not see open skin area is  contraindication to using opzelura. Advised her to ask Dr. Alm and/or a pharmacist for more precise guidance.   Follow Up Instructions: I discussed the assessment and treatment plan with the patient. The patient was provided an opportunity to ask questions and all were answered. The patient agreed with the plan and demonstrated an understanding of the instructions.  A copy of instructions were sent to  the patient via MyChart unless otherwise noted below.   The patient was advised to call back or seek an in-person evaluation if the symptoms worsen or if the condition fails to improve as anticipated.    Jon CHRISTELLA Belt, NP

## 2024-03-28 NOTE — Patient Instructions (Signed)
 VISIT SUMMARY:  Today, you came in for a follow-up on your eczema treatment. We discussed the progress of your condition, especially the improvement in your hands and the ongoing issues with your feet. We also talked about starting a new medication, Dupixent, to help manage your eczema long-term.  YOUR PLAN:  -ECZEMA INVOLVING HANDS AND FEET (Biopsy Proven):  Eczema is a condition that causes the skin to become inflamed, itchy, and cracked. Your hands have shown significant improvement with the use of Opzelura cream, but your feet are still healing. We planned on starting you on Dupixent injections, but you declined to start them. Continue using Opzelura cream daily and monitor for any side effects, especially related to your eyes.   -CALLUSES OF FEET:  Calluses are thickened areas of skin that can delay healing. Your feet have shown improvement with the use of Opzelura and urea cream. We recommend continuing the urea cream and scheduling a pedicure to help remove dead skin and calluses.  INSTRUCTIONS:  Please continue using Opzelura cream daily on your hands and feet. You were approved for Dupixent for better long term management (a treatment that would allow you to no longer need to apply any topical creams) but you declined starting this medication.  I recommend you take the time to review the medication so you feel comfortable starting this since I think this is the best long term treatment option and it is very safe (it does NOT suppress your immune system)  Also, continue applying the urea cream on your feet and schedule a pedicure to remove dead skin and calluses. Follow up with us  if you notice any new symptoms or have any concerns.    Important Information  Due to recent changes in healthcare laws, you may see results of your pathology and/or laboratory studies on MyChart before the doctors have had a chance to review them. We understand that in some cases there may be results that are  confusing or concerning to you. Please understand that not all results are received at the same time and often the doctors may need to interpret multiple results in order to provide you with the best plan of care or course of treatment. Therefore, we ask that you please give us  2 business days to thoroughly review all your results before contacting the office for clarification. Should we see a critical lab result, you will be contacted sooner.   If You Need Anything After Your Visit  If you have any questions or concerns for your doctor, please call our main line at [phone] If no one answers, please leave a voicemail as directed and we will return your call as soon as possible. Messages left after 4 pm will be answered the following business day.   You may also send us  a message via MyChart. We typically respond to MyChart messages within 1-2 business days.  For prescription refills, please ask your pharmacy to contact our office. Our fax number is [phone].  If you have an urgent issue when the clinic is closed that cannot wait until the next business day, you can page your doctor at the number below.    Please note that while we do our best to be available for urgent issues outside of office hours, we are not available 24/7.   If you have an urgent issue and are unable to reach us , you may choose to seek medical care at your doctor's office, retail clinic, urgent care center, or emergency room.  If  you have a medical emergency, please immediately call 911 or go to the emergency department. In the event of inclement weather, please call our main line at [phone] for an update on the status of any delays or closures.  Dermatology Medication Tips: Please keep the boxes that topical medications come in in order to help keep track of the instructions about where and how to use these. Pharmacies typically print the medication instructions only on the boxes and not directly on the  medication tubes.   If your medication is too expensive, please contact our office at [phone] or send us  a message through MyChart.   We are unable to tell what your co-pay for medications will be in advance as this is different depending on your insurance coverage. However, we may be able to find a substitute medication at lower cost or fill out paperwork to get insurance to cover a needed medication.   If a prior authorization is required to get your medication covered by your insurance company, please allow us  1-2 business days to complete this process.  Drug prices often vary depending on where the prescription is filled and some pharmacies may offer cheaper prices.  The website www.goodrx.com contains coupons for medications through different pharmacies. The prices here do not account for what the cost may be with help from insurance (it may be cheaper with your insurance), but the website can give you the price if you did not use any insurance.  - You can print the associated coupon and take it with your prescription to the pharmacy.  - You may also stop by our office during regular business hours and pick up a GoodRx coupon card.  - If you need your prescription sent electronically to a different pharmacy, notify our office through [facility] or by phone at [phone]   '

## 2024-03-28 NOTE — Patient Instructions (Signed)
 Wendy Serrano, thank you for joining Jon CHRISTELLA Belt, NP for today's virtual visit.  While this provider is not your primary care provider (PCP), if your PCP is located in our provider database this encounter information will be shared with them immediately following your visit.   A [facility] MyChart account gives you access to today's visit and all your visits, tests, and labs performed at [facility]  click here if you don't have a  MyChart account or go to [email or web address]  Consent: (Patient) Wendy Serrano provided verbal consent for this virtual visit at the beginning of the encounter.  Current Medications:  Current Outpatient Medications:    amantadine (SYMMETREL) 100 MG capsule, Take 100 mg by mouth., Disp: , Rfl:    aspirin 81 MG chewable tablet, Chew 81 mg by mouth daily., Disp: , Rfl:    clobetasol  ointment (TEMOVATE ) 0.05 %, Apply 1 Application topically 2 (two) times daily. Apply 2 times daily for 2 weeks then STOP and take a break for 2 weeks. While taking a 2 week break apply Pea sized amount of Opzelura 2 times Daily., Disp: 60 g, Rfl: 9   cyclobenzaprine  (FLEXERIL ) 5 MG tablet, Take 5 mg by mouth., Disp: , Rfl:    Difluprednate 0.05 % EMUL, Apply 1 drop to eye., Disp: , Rfl:    Donanemab-azbt  (KISUNLA ) 350 MG/20ML SOLN, Inject 700 mg into the vein every 30 (thirty) days. After third infusion ,the dose will be 1400 mg, Disp: 40 mL, Rfl: 2   donepezil  (ARICEPT ) 10 MG tablet, Take 10 mg by mouth., Disp: , Rfl:    Dupilumab (DUPIXENT) 300 MG/2ML SOAJ, Inject 300 mg into the skin every 14 (fourteen) days. Starting at day 15 for maintenance., Disp: 4 mL, Rfl: 11   famotidine  (PEPCID ) 40 MG tablet, Take 40 mg by mouth., Disp: , Rfl:    FOLIC ACID PO, Take 4 tablets by mouth daily., Disp: , Rfl:    gabapentin  (NEURONTIN ) 100 MG capsule, Take 100 mg by mouth., Disp: , Rfl:    glucosamine-chondroitin 500-400 MG tablet, Take 1 tablet by mouth 2 (two)  times daily., Disp: , Rfl:    ketoconazole  (NIZORAL ) 2 % cream, APPLY TO AFFECTED AREA TWICE A DAY, Disp: 15 g, Rfl: 0   Magnesium 400 MG TABS, Take 1 tablet by mouth daily., Disp: , Rfl:    meclizine  (ANTIVERT ) 12.5 MG tablet, TAKE 1 TABLET BY MOUTH 3 TIMES DAILY AS NEEDED FOR DIZZINESS., Disp: , Rfl:    memantine  (NAMENDA ) 10 MG tablet, Take 1 tablet (10 mg total) by mouth 2 (two) times daily., Disp: 180 tablet, Rfl: 3   methotrexate (RHEUMATREX) 2.5 MG tablet, 6 tablets on Saturday, Disp: , Rfl: 3   OPZELURA 1.5 % CREA, Apply to affected areas of the body BID., Disp: 60 g, Rfl: 2   prednisoLONE acetate (PRED FORTE) 1 % ophthalmic suspension, PLACE 1 DROP INTO BOTH EYES ONCE DAILY, Disp: , Rfl:    predniSONE (DELTASONE) 10 MG tablet, Take 6 tablets po x1 day, 5 tablets po x 1 day, 4 tablets po x 1 day, 3 tablets po x 1 day, 2 tablets po x 1 day, 1 tablet po x 1 day, Disp: , Rfl:    pregabalin  (LYRICA ) 25 MG capsule, TAKE 1 CAPSULE BY MOUTH TWICE A DAY, Disp: 60 capsule, Rfl: 5   rivastigmine  (EXELON ) 9.5 mg/24hr, Place 1 patch (9.5 mg total) onto the skin daily., Disp: 30 patch, Rfl: 12   Safety Seal Miscellaneous  MISC, Apply 1 Application topically at bedtime. Medication Name: Soft Heel Cream (Urea 40%, Salicylic Acid 12%), Disp: 30 g, Rfl: 10   SIMBRINZA 1-0.2 % SUSP, Place 1 drop into both eyes 2 (two) times daily., Disp: , Rfl: 6   SODIUM FLUORIDE 5000 PPM 1.1 % PSTE, as directed., Disp: , Rfl:    tacrolimus  (PROTOPIC ) 0.1 % ointment, Apply topically 2 (two) times daily. Apply 2 x daily to palms for 2 weeks then stop. Alternate with Clotrimazole /Betamethasone  cream, Disp: 100 g, Rfl: 2   terbinafine  (LAMISIL ) 250 MG tablet, Take 250 mg by mouth daily., Disp: , Rfl:    traMADol  (ULTRAM ) 50 MG tablet, Take 1 tablet (50 mg total) by mouth every 6 (six) hours as needed., Disp: 30 tablet, Rfl: 0   UNABLE TO FIND, Take 2 capsules by mouth daily. Med Name: Lions mane, Disp: , Rfl:    UNABLE TO FIND,  Take 2 capsules by mouth daily. Med Name: Kyolic- for cholesterol, Disp: , Rfl:    Vitamin D -Vitamin K (VITAMIN K2-VITAMIN D3 PO), Take by mouth., Disp: , Rfl:   Current Facility-Administered Medications:    Romosozumab -aqqg (EVENITY ) 105 MG/1. injection 210 mg, 210 mg, Subcutaneous, Q30 days, Plotnikov, Aleksei V, MD, 210 mg at 03/17/24 1053   Romosozumab -aqqg (EVENITY ) 105 MG/1. injection 210 mg, 210 mg, Subcutaneous, Once, Plotnikov, Aleksei V, MD   Medications ordered in this encounter:  No orders of the defined types were placed in this encounter.    *If you need refills on other medications prior to your next appointment, please contact your pharmacy*  Follow-Up: Call back or seek an in-person evaluation if the symptoms worsen or if the condition fails to improve as anticipated.  [facility] [phone]  Other Instructions It is ok to use aquaphor or vaseline on your hands. I do not see a contraindication to using opzelura on open skin but a pharmacist or Dr. Alm will be able to confirm this.    If you have been instructed to have an in-person evaluation today at a local Urgent Care facility, please use the link below. It will take you to a list of all of our available [facility], including address, phone number and hours of operation. Please do not delay care.  [facility]  If you or a family member do not have a primary care provider, use the link below to schedule a visit and establish care. When you choose a [facility] primary care physician or advanced practice provider, you gain a long-term partner in health. Find a Primary Care Provider  Learn more about [facility]'s in-office and virtual care options: [facility] Get Care Now

## 2024-03-29 ENCOUNTER — Encounter: Payer: Self-pay | Admitting: Neurology

## 2024-03-29 ENCOUNTER — Ambulatory Visit: Admitting: Neurology

## 2024-03-29 VITALS — BP 138/82 | HR 85 | Ht 68.0 in | Wt 129.0 lb

## 2024-03-29 DIAGNOSIS — G309 Alzheimer's disease, unspecified: Secondary | ICD-10-CM | POA: Insufficient documentation

## 2024-03-29 DIAGNOSIS — F0280 Dementia in other diseases classified elsewhere without behavioral disturbance: Secondary | ICD-10-CM | POA: Diagnosis not present

## 2024-03-29 MED ORDER — MEMANTINE HCL 10 MG PO TABS: 10.0000 mg | ORAL_TABLET | Freq: Two times a day (BID) | ORAL | 2 refills | Status: DC

## 2024-03-29 NOTE — Progress Notes (Signed)
 Provider:  Dedra Gores, MD  Primary Care Physician:  Garald Karlynn GAILS, MD [address] [place] [address]     Referring Provider: Garald Karlynn GAILS, Md [address] [place],  [place] [address]          Chief Complaint according to patient   Patient presents with:     Patient is here with Dr. [name] Hummer, her husband,  for memory follow - no concerns at this time. Stana 26th at [facility] for second opinion  She has become very concerned about infusion anti-amyloid therapy and is not pursuing that therapy.  Good appetite   Sleep : no insomnia,  sleeps well.              HISTORY OF PRESENT ILLNESS:  Wendy Serrano is a 78 y.o. female patient who is here for revisit 03/29/2024 for memory disorder. . Chief concern according to patient : Patient is here with Dr. [name] Hummer, her husband,  for memory follow - no concerns at this time. Stana 26th at [facility] for second opinion  She has become very concerned about infusion anti-amyloid therapy and is not pursuing that therapy.  Good appetite   Sleep : no insomnia,  sleeps well.  Social HX; see previous note   Sleeps 8.5 hours no extra naps. Good appetite.    Wendy Serrano is a 78 y.o. female patient who is here for revisit 05/11/2023 for  meeting to discuss MRI, no changes over MRI 1 -2022, .  Chief concern according to patient :  I want to know if I am a candidate for the MAB therapy.    Wendy Serrano is a 78 y.o. female patient who is here for revisit 03/24/2023 for Neuro-cognitive disorder.  this is a revisit earlier than scheduled, last seen August 2024  Chief concern according to patient :  none. She is taking pregabalin  - per Dr Garald , for a scalp dysesthesia. This was never an issue in our visits. She also mentioned a tinnitus, neither pulsatile, neither a high pitched sound.     Dr Hummer stated his wife has begun to follow him, walking behind him when they go shopping. She  is writing a shopping list , but ventures off. She needs reminders, but is able to dress and feed herself. She is well groomed.    mini-mental status exam was not given today, for unknown reasons the patient had a MOCA test 11/ 24.  MMSE 25/ 30.        03/29/2024    2:53 PM 11/23/2023    2:55 PM 07/09/2023   11:24 AM  MMSE - Mini Mental State Exam  Orientation to time 4 5 3   Orientation to Place 5 4 5   Registration 3 3 3   Attention/ Calculation 1 ( 2 more when using WORLD spelling) 3 2  Recall 2 0 0  Language- name 2 objects 2 2 2   Language- repeat 1 1 1   Language- follow 3 step command 2 3 2   Language- read & follow direction 1 1 1   Write a sentence 1 1 1   Copy design 0 1 0  Total score 22 24 20        03/24/2023   10:47 AM 08/12/2022    8:20 AM  Montreal Cognitive Assessment   Visuospatial/ Executive (0/5) 0 1  Naming (0/3) 2 3  Attention: Read list of digits (0/2) 1 2  Attention: Read list of letters (  0/1) 1 1  Attention: Serial 7 subtraction starting at 100 (0/3) 1 1  Language: Repeat phrase (0/2) 1 1  Language : Fluency (0/1) 0 0  Abstraction (0/2) 2 2  Delayed Recall (0/5) 0 0  Orientation (0/6) 3 3  Total 11 14     Review of Systems:   Social History   Socioeconomic History   Marital status: Married    Spouse name: [name]   Number of children: 0   Years of education: college   Highest education level: Master's degree (e.g., MA, MS, MEng, MEd, MSW, MBA)  Occupational History   Occupation: Retired  Tobacco Use   Smoking status: Never   Smokeless tobacco: Never  Vaping Use   Vaping status: Never Used  Substance and Sexual Activity   Alcohol use: No   Drug use: No   Sexual activity: Never    Birth control/protection: Post-menopausal  Other Topics Concern   Not on file  Social History Narrative   [employer] college BA; MEd-teaching;MEd Administration @ [employer]ried '67   Has 11 children who were foster care or mentoredRetiredSO retired, Has had some  health problems. They are doing well   Marriage is in good health and retirement is goodEnd-of-life: provided packet of information and forms for consideration (Oct'11)      Lives with husband   Right handed   Caffeine: zero   Social Drivers of Health   Financial Resource Strain: Low Risk  (02/02/2024)   Received from [facility]   Overall Financial Resource Strain (CARDIA)    Difficulty of Paying Living Expenses: Not hard at all  Food Insecurity: No Food Insecurity (02/02/2024)   [facility] from [facility]   Hunger Vital Sign    Within the past 12 months, you worried that your food would run out before you got the money to buy more.: Never true    Within the past 12 months, the food you bought just didn't last and you didn't have money to get more.: Never true  Transportation Needs: No Transportation Needs (02/02/2024)   Received from [facility] - Transportation    In the past 12 months, has lack of transportation kept you from medical appointments or from getting medications?: No    Lack of Transportation (Non-Medical): No  Physical Activity: Sufficiently Active (09/08/2023)   Exercise Vital Sign    Days of Exercise per Week: 3 days    Minutes of Exercise per Session: 60 min  Stress: No Stress Concern Present (09/08/2023)   [employer] of Occupational Health - Occupational Stress Questionnaire    Feeling of Stress : Only a little  Social Connections: Socially Integrated (09/08/2023)   Social Connection and Isolation Panel    Frequency of Communication with Friends and Family: More than three times a week    Frequency of Social Gatherings with Friends and Family: Twice a week    Attends Religious Services: More than 4 times per year    Active Member of [employer] or Organizations: Yes    Attends Banker Meetings: Never    Marital Status: Married    Family History  Problem Relation Age of Onset    Prostate cancer Father        died   Pancreatic cancer Mother        died   Diabetes Mother    Hyperlipidemia Sister     Past Medical History:  Diagnosis Date   Allergic rhinitis  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   Menopausal symptoms    Renal cyst    bilateral   Rotator cuff tear    Yeast infection     Past Surgical History:  Procedure Laterality Date   ROTATOR CUFF REPAIR Left 08/2009   Left shoulder-rotator cuff repair [place])   WISDOM TOOTH EXTRACTION       Current Outpatient Medications on File Prior to Visit  Medication Sig Dispense Refill   amantadine (SYMMETREL) 100 MG capsule Take 100 mg by mouth.     aspirin 81 MG chewable tablet Chew 81 mg by mouth daily.     Difluprednate 0.05 % EMUL Apply 1 drop to eye.     Donanemab-azbt  (KISUNLA ) 350 MG/20ML SOLN Inject 700 mg into the vein every 30 (thirty) days. After third infusion ,the dose will be 1400 mg (Patient taking differently: Inject 700 mg into the vein every 30 (thirty) days. After third infusion ,the dose will be 1400 mg Stopped) 40 mL 2   donepezil  (ARICEPT ) 10 MG tablet Take 10 mg by mouth.     Dupilumab (DUPIXENT) 300 MG/2ML SOAJ Inject 300 mg into the skin every 14 (fourteen) days. Starting at day 15 for maintenance. (Patient taking differently: Inject 300 mg into the skin every 14 (fourteen) days. Starting at day 15 for maintenance. Has not started) 4 mL 11   FOLIC ACID PO Take 4 tablets by mouth daily.     glucosamine-chondroitin 500-400 MG tablet Take 1 tablet by mouth 2 (two) times daily.     Magnesium 400 MG TABS Take 1 tablet by mouth daily.      memantine  (NAMENDA ) 10 MG tablet Take 1 tablet (10 mg total) by mouth 2 (two) times daily. 180 tablet 3   methotrexate (RHEUMATREX) 2.5 MG tablet 6 tablets on Saturday  3   OPZELURA 1.5 % CREA Apply to affected areas of the body BID. 60 g 2   pregabalin  (LYRICA ) 25 MG capsule TAKE 1 CAPSULE BY MOUTH TWICE A DAY 60 capsule 5   rivastigmine  (EXELON ) 9.5 mg/24hr Place 1 patch (9.5 mg total) onto the skin daily. 30 patch 12   Safety Seal Miscellaneous MISC Apply 1 Application topically at bedtime. Medication Name: Soft Heel Cream (Urea 40%, Salicylic Acid 12%) 30 g 10   SIMBRINZA 1-0.2 % SUSP Place 1 drop into both eyes 2 (two) times daily.  6   SODIUM FLUORIDE 5000 PPM 1.1 % PSTE as directed.     terbinafine  (LAMISIL ) 250 MG tablet Take 250 mg by mouth daily.     UNABLE TO FIND Take 2 capsules by mouth daily. Med Name: Lions mane     UNABLE TO FIND Take 2 capsules by mouth daily. Med Name: Kyolic- for cholesterol     Vitamin D -Vitamin K (VITAMIN K2-VITAMIN D3 PO) Take by mouth.     clobetasol  ointment (TEMOVATE ) 0.05 % Apply 1 Application topically 2 (two) times daily. Apply 2 times daily for 2 weeks then STOP and take a break for 2 weeks. While taking a 2  week break apply Pea sized amount of Opzelura 2 times Daily. (Patient not taking: Reported on 03/29/2024) 60 g 9   cyclobenzaprine  (FLEXERIL ) 5 MG tablet Take 5 mg by mouth. (Patient not taking: Reported on 03/29/2024)     famotidine  (PEPCID ) 40 MG tablet Take 40 mg by mouth. (Patient not taking: Reported on 03/29/2024)     gabapentin  (NEURONTIN ) 100 MG capsule Take 100 mg by mouth. (Patient not taking: Reported on 03/29/2024)     ketoconazole  (NIZORAL ) 2 % cream APPLY TO AFFECTED AREA TWICE A DAY (Patient not taking: Reported on 03/29/2024) 15 g 0   meclizine  (ANTIVERT ) 12.5 MG tablet TAKE 1 TABLET BY MOUTH 3 TIMES DAILY AS NEEDED FOR DIZZINESS. (Patient not taking: Reported on 03/29/2024)     prednisoLONE acetate (PRED FORTE) 1 % ophthalmic  suspension PLACE 1 DROP INTO BOTH EYES ONCE DAILY (Patient not taking: Reported on 03/29/2024)     predniSONE (DELTASONE) 10 MG tablet Take 6 tablets po x1 day, 5 tablets po x 1 day, 4 tablets po x 1 day, 3 tablets po x 1 day, 2 tablets po x 1 day, 1 tablet po x 1 day (Patient not taking: Reported on 03/29/2024)     tacrolimus  (PROTOPIC ) 0.1 % ointment Apply topically 2 (two) times daily. Apply 2 x daily to palms for 2 weeks then stop. Alternate with Clotrimazole /Betamethasone  cream (Patient not taking: Reported on 03/29/2024) 100 g 2   traMADol  (ULTRAM ) 50 MG tablet Take 1 tablet (50 mg total) by mouth every 6 (six) hours as needed. (Patient not taking: Reported on 03/29/2024) 30 tablet 0   Current Facility-Administered Medications on File Prior to Visit  Medication Dose Route Frequency Provider Last Rate Last Admin   Romosozumab -aqqg (EVENITY ) 105 MG/1. injection 210 mg  210 mg Subcutaneous Q30 days Plotnikov, Aleksei V, MD   210 mg at 03/17/24 1053   Romosozumab -aqqg (EVENITY ) 105 MG/1. injection 210 mg  210 mg Subcutaneous Once Plotnikov, Aleksei V, MD        Allergies  Allergen Reactions   Fluorescein-Benoxinate Other (See Comments)    benoxinate / fluorescein     DIAGNOSTIC DATA (LABS, IMAGING, TESTING) - I reviewed patient records, labs, notes, testing and imaging myself where available.  Lab Results  Component Value Date   WBC 6.2 01/07/2023   HGB 13.2 01/07/2023   HCT 40.1 01/07/2023   MCV 95.8 01/07/2023   PLT 265.0 01/07/2023      Component Value Date/Time   NA 142 10/12/2023 1549   K 4.0 10/12/2023 1549   CL 108 10/12/2023 1549   CO2 29 10/12/2023 1549   GLUCOSE 94 10/12/2023 1549   BUN 23 10/12/2023 1549   CREATININE 0.88 10/12/2023 1549   CALCIUM 9.6 10/12/2023 1549   PROT 6.8 10/12/2023 1549   ALBUMIN 4.3 10/12/2023 1549   AST 31 10/12/2023 1549   ALT 29 10/12/2023 1549   ALKPHOS 70 10/12/2023 1549   BILITOT 0.6 10/12/2023 1549   GFRNONAA 111.51  02/19/2010 1100   GFRAA 81 02/02/2008 0955   Lab Results  Component Value Date   CHOL 208 (H) 08/22/2020   HDL 86.40 08/22/2020   LDLCALC 112 (H) 08/22/2020   LDLDIRECT 127.9 03/09/2013   TRIG 51.0 08/22/2020   CHOLHDL 2 08/22/2020   Lab Results  Component Value Date   HGBA1C 5.4 02/26/2011   Lab Results  Component Value Date   VITAMINB12 >1504 (H) 06/26/2021   Lab Results  Component Value Date   TSH 3.69  01/07/2023    PHYSICAL EXAM:  Vitals:   03/29/24 1436  BP: 138/82  Pulse: 85  SpO2: 99%   No data found. Body mass index is 19.61 kg/m.   Wt Readings from Last 3 Encounters:  03/29/24 129 lb (58.5 kg)  11/23/23 131 lb 9.6 oz (59.7 kg)  10/12/23 129 lb (58.5 kg)     Ht Readings from Last 3 Encounters:  03/29/24 5' 8 (1.727 m)  11/23/23 5' 8 (1.727 m)  10/12/23 5' 8 (1.727 m)      General: The patient is awake, alert and appears not in acute distress and groomed. Head: Normocephalic, atraumatic.  Neck is supple. Attention span & concentration ability appears normal.  Speech is fluent,  without  dysarthria, dysphonia or aphasia.  Mood and affect are appropriate.   Coordination: Rapid alternating movements in the fingers/hands were of normal speed.  The Finger-to-nose maneuver was intact without evidence of ataxia, dysmetria or tremor.   Gait and station: Patient could rise unassisted from a seated position, walked without assistive device.  Stance is of normal width/ base..  Toe and heel walk were deferred.  Deep tendon reflexes: in the  upper and lower extremities are symmetric and intact.  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      1)  progressed Alzheimer's disease-  ATN positive , MRI typical , no hemorrhages seen.    on Prevagen and Lions Mane supplement. The  patient  has not qualified by [facility], needs MMSE- on Rivastigmin patches. No refill for the patches needed but for Namenda .   Not stooped , not incontinent, no behavior changes. She voluntarily quit driving.    Her MMSE score was predictably better, but she displayed problems with serial 7 and with immediate recall. Was able to draw, but not a clock face with correct hands.   No orders of the defined types were placed in this encounter.   I will follow up as needed. At this time we await a second opinion from [facility] Arlyss Louder, MD      I would like to thank Plotnikov, Karlynn GAILS, MD  for allowing me to meet with this pleasant patient.    The patient's condition requires frequent monitoring and adjustments in the treatment plan, reflecting the ongoing complexity of care.  This provider is the continuing focal point for all needed services for this condition.  After spending a total time of  40  minutes face to face and time for  history taking, physical and neurologic examination, review of laboratory studies,  personal review of imaging studies, reports and results of other testing and review of referral information / records as far as provided in visit,   Electronically signed by: Dedra Gores, MD 03/29/2024 3:00 PM  [facility] Board certified by The [employer] of Sleep Medicine and Diplomate of the [employer] of Sleep Medicine. Board certified In Neurology through the ABPN, Fellow of the [employer] of Neu[employer].

## 2024-04-08 ENCOUNTER — Other Ambulatory Visit: Payer: Self-pay

## 2024-04-08 ENCOUNTER — Emergency Department (HOSPITAL_BASED_OUTPATIENT_CLINIC_OR_DEPARTMENT_OTHER)
Admission: EM | Admit: 2024-04-08 | Discharge: 2024-04-08 | Disposition: A | Discharge status: Home / Self Care | Attending: Emergency Medicine | Admitting: Emergency Medicine

## 2024-04-08 ENCOUNTER — Emergency Department (HOSPITAL_BASED_OUTPATIENT_CLINIC_OR_DEPARTMENT_OTHER): Admitting: Radiology

## 2024-04-08 ENCOUNTER — Encounter (HOSPITAL_BASED_OUTPATIENT_CLINIC_OR_DEPARTMENT_OTHER): Payer: Self-pay | Admitting: Emergency Medicine

## 2024-04-08 DIAGNOSIS — R079 Chest pain, unspecified: Secondary | ICD-10-CM | POA: Diagnosis not present

## 2024-04-08 DIAGNOSIS — D72819 Decreased white blood cell count, unspecified: Secondary | ICD-10-CM | POA: Insufficient documentation

## 2024-04-08 DIAGNOSIS — Z7982 Long term (current) use of aspirin: Secondary | ICD-10-CM | POA: Diagnosis not present

## 2024-04-08 DIAGNOSIS — G309 Alzheimer's disease, unspecified: Secondary | ICD-10-CM | POA: Insufficient documentation

## 2024-04-08 DIAGNOSIS — R072 Precordial pain: Secondary | ICD-10-CM | POA: Insufficient documentation

## 2024-04-08 LAB — BASIC METABOLIC PANEL WITH GFR
Anion gap: 9 (ref 5–15)
BUN: 17 mg/dL (ref 8–23)
CO2: 26 mmol/L (ref 22–32)
Calcium: 9.9 mg/dL (ref 8.9–10.3)
Chloride: 106 mmol/L (ref 98–111)
Creatinine, Ser: 0.8 mg/dL (ref 0.44–1.00)
GFR, Estimated: >60 mL/min (ref >60)
Glucose, Bld: 101 mg/dL — ABNORMAL HIGH (ref 70–99)
Potassium: 3.6 mmol/L (ref 3.5–5.1)
Sodium: 142 mmol/L (ref 135–145)

## 2024-04-08 LAB — CBC
HCT: 38.4 % (ref 36.0–46.0)
Hemoglobin: 12.8 g/dL (ref 12.0–15.0)
MCH: 31.8 pg (ref 26.0–34.0)
MCHC: 33.3 g/dL (ref 30.0–36.0)
MCV: 95.3 fL (ref 80.0–100.0)
Platelets: 230 K/uL (ref 150–400)
RBC: 4.03 MIL/uL (ref 3.87–5.11)
RDW: 14 % (ref 11.5–15.5)
WBC: 3.2 K/uL — ABNORMAL LOW (ref 4.0–10.5)
nRBC: 0 % (ref 0.0–0.2)

## 2024-04-08 LAB — TROPONIN T, HIGH SENSITIVITY
Troponin T High Sensitivity: 24 ng/L — ABNORMAL HIGH (ref 0–19)
Troponin T High Sensitivity: 17 ng/L (ref 0–19)

## 2024-04-08 NOTE — ED Triage Notes (Signed)
 Pt via pov from home with chest pain since yesterday. No change in intensity. Denies sob, n/v. Pt a&o x 4; nad noted.

## 2024-04-08 NOTE — ED Provider Notes (Signed)
  EMERGENCY DEPARTMENT AT [facility] Provider Note   CSN: [id number] Arrival date & time: 04/08/24  1218     Patient presents with: Chest Pain   Wendy Serrano is a 78 y.o. female with past medical history of Alzheimer's, who presents to the emergency department for evaluation of chest pain.  Patient reports her chest pain is more of a discomfort that has been present for the last week but feels different in intensity today prompting her visit.  Patient is accompanied by her husband who is at bedside, who stated they reported to the ED today to get things checked out.  Patient denies any shortness of breath or difficulty breathing.  She states her chest pain is not impacted by movement.  She denies any recent sick contacts, URI symptoms, abdominal pain, nausea, vomiting.  Patient did take some Tylenol a couple of days ago for the pain, but has not taken anything today.    Chest Pain      Prior to Admission medications   Medication Sig Start Date End Date Taking? Authorizing Provider  amantadine (SYMMETREL) 100 MG capsule Take 100 mg by mouth.    [provider]  aspirin 81 MG chewable tablet Chew 81 mg by mouth daily.    [provider]  clobetasol  ointment (TEMOVATE ) 0.05 % Apply 1 Application topically 2 (two) times daily. Apply 2 times daily for 2 weeks then STOP and take a break for 2 weeks. While taking a 2 week break apply Pea sized amount of Opzelura  2 times Daily. Patient not taking: Reported on 03/29/2024 02/18/24   Alm Delon SAILOR, DO  cyclobenzaprine  (FLEXERIL ) 5 MG tablet Take 5 mg by mouth. Patient not taking: Reported on 03/29/2024 06/09/23   [provider]  Difluprednate 0.05 % EMUL Apply 1 drop to eye. 03/21/22   [provider]  Donanemab-azbt  (KISUNLA ) 350 MG/20ML SOLN Inject 700 mg into the vein every 30 (thirty) days. After third infusion ,the dose will be 1400 mg Patient taking differently: Inject 700 mg  into the vein every 30 (thirty) days. After third infusion ,the dose will be 1400 mg Stopped 11/23/23   Dohmeier, Carmen, MD  donepezil  (ARICEPT ) 10 MG tablet Take 10 mg by mouth.    [provider]  Dupilumab  (DUPIXENT ) 300 MG/2ML SOAJ Inject 300 mg into the skin every 14 (fourteen) days. Starting at day 15 for maintenance. Patient taking differently: Inject 300 mg into the skin every 14 (fourteen) days. Starting at day 15 for maintenance. Has not started 02/29/24   Alm Delon SAILOR, DO  famotidine  (PEPCID ) 40 MG tablet Take 40 mg by mouth. Patient not taking: Reported on 03/29/2024    [provider]  FOLIC ACID PO Take 4 tablets by mouth daily.    [provider]  gabapentin  (NEURONTIN ) 100 MG capsule Take 100 mg by mouth. Patient not taking: Reported on 03/29/2024    [provider]  glucosamine-chondroitin 500-400 MG tablet Take 1 tablet by mouth 2 (two) times daily.    [provider]  ketoconazole  (NIZORAL ) 2 % cream APPLY TO AFFECTED AREA TWICE A DAY Patient not taking: Reported on 03/29/2024 07/13/23   Alm Delon SAILOR, DO  Magnesium 400 MG TABS Take 1 tablet by mouth daily.    [provider]  memantine  (NAMENDA ) 10 MG tablet Take 1 tablet (10 mg total) by mouth 2 (two) times daily. 03/29/24   Dohmeier, Dedra, MD  methotrexate (RHEUMATREX) 2.5 MG tablet 6 tablets on Saturday  08/09/15   [provider]  OPZELURA  1.5 % CREA Apply to affected areas of the body BID. 03/08/24   Alm Delon SAILOR, DO  prednisoLONE acetate (PRED FORTE) 1 % ophthalmic suspension PLACE 1 DROP INTO BOTH EYES ONCE DAILY Patient not taking: Reported on 03/29/2024    [provider]  predniSONE (DELTASONE) 10 MG tablet Take 6 tablets po x1 day, 5 tablets po x 1 day, 4 tablets po x 1 day, 3 tablets po x 1 day, 2 tablets po x 1 day, 1 tablet po x 1 day Patient not taking: Reported on 03/29/2024 09/23/23   [provider]  pregabalin   (LYRICA ) 25 MG capsule TAKE 1 CAPSULE BY MOUTH TWICE A DAY 11/12/23   Plotnikov, Aleksei V, MD  rivastigmine  (EXELON ) 9.5 mg/24hr Place 1 patch (9.5 mg total) onto the skin daily. 11/23/23   Dohmeier, Dedra, MD  Safety Seal Miscellaneous MISC Apply 1 Application topically at bedtime. Medication Name: Soft Heel Cream (Urea 40%, Salicylic Acid 12%) 10/12/23   Alm Delon SAILOR, DO  SIMBRINZA 1-0.2 % SUSP Place 1 drop into both eyes 2 (two) times daily. 07/29/15   [provider]  SODIUM FLUORIDE 5000 PPM 1.1 % PSTE as directed. 06/11/23   [provider]  tacrolimus  (PROTOPIC ) 0.1 % ointment Apply topically 2 (two) times daily. Apply 2 x daily to palms for 2 weeks then stop. Alternate with Clotrimazole /Betamethasone  cream Patient not taking: Reported on 03/29/2024 09/25/22   Alm Delon SAILOR, DO  terbinafine  (LAMISIL ) 250 MG tablet Take 250 mg by mouth daily. 02/03/24   [provider]  traMADol  (ULTRAM ) 50 MG tablet Take 1 tablet (50 mg total) by mouth every 6 (six) hours as needed. Patient not taking: Reported on 03/29/2024 06/09/23   [name] Lynwood ORN, MD  UNABLE TO FIND Take 2 capsules by mouth daily. Med Name: [facility], Historical, MD  UNABLE TO FIND Take 2 capsules by mouth daily. Med Name: Kyolic- for cholesterol    [provider]  Vitamin D -Vitamin K (VITAMIN K2-VITAMIN D3 PO) Take by mouth.    [provider]    Allergies: Fluorescein-benoxinate    Review of Systems  Cardiovascular:  Positive for chest pain.    Updated Vital Signs BP 123/77   Pulse (!) 50   Temp (!) 97.5 F (36.4 C)   Resp 17   Ht 5' 8 (1.727 m)   Wt 59.9 kg   SpO2 100%   BMI 20.07 kg/m   Physical Exam Vitals and nursing note reviewed.  Constitutional:      Appearance: Normal appearance.  HENT:     Head: Normocephalic and atraumatic.     Mouth/Throat:     Mouth: Mucous membranes are moist.  Eyes:     General: No scleral icterus.       Right eye: No  discharge.        Left eye: No discharge.     Conjunctiva/sclera: Conjunctivae normal.  Cardiovascular:     Rate and Rhythm: Normal rate and regular rhythm.     Pulses: Normal pulses.  Pulmonary:     Effort: Pulmonary effort is normal.     Breath sounds: Normal breath sounds.  Abdominal:     General: There is no distension.     Tenderness: There is no abdominal tenderness.  Musculoskeletal:        General: No deformity.     Cervical back: Normal range of motion.  Skin:  General: Skin is warm and dry.     Capillary Refill: Capillary refill takes less than 2 seconds.  Neurological:     Mental Status: She is alert.     Motor: No weakness.  Psychiatric:        Mood and Affect: Mood normal.     (all labs ordered are listed, but only abnormal results are displayed) Labs Reviewed  BASIC METABOLIC PANEL WITH GFR - Abnormal; Notable for the following components:      Result Value   Glucose, Bld 101 (*)    All other components within normal limits  CBC - Abnormal; Notable for the following components:   WBC 3.2 (*)    All other components within normal limits  TROPONIN T, HIGH SENSITIVITY - Abnormal; Notable for the following components:   Troponin T High Sensitivity 24 (*)    All other components within normal limits  TROPONIN T, HIGH SENSITIVITY    EKG: EKG Interpretation Date/Time:  Friday April 08 2024 13:02:15 EST Ventricular Rate:  65 PR Interval:  173 QRS Duration:  85 QT Interval:  400 QTC Calculation: 416 R Axis:   52  Text Interpretation: Sinus rhythm Ventricular premature complex Low voltage, precordial leads No old tracing to compare Confirmed by Charlyn Sora [id number]) on 04/08/2024 1:35:28 PM  Radiology: DG Chest 2 View Result Date: 04/08/2024 CLINICAL DATA:  Chest pain. EXAM: CHEST - 2 VIEW COMPARISON:  Cardiac CT, 10/10/2020. FINDINGS: Cardiac silhouette is normal in size and configuration. No mediastinal or hilar masses. No evidence of adenopathy.  Lungs are hyperexpanded, but clear. No pleural effusion or pneumothorax. Skeletal structures are intact. IMPRESSION: No active cardiopulmonary disease. Electronically Signed   By: Alm Parkins M.D.   On: 04/08/2024 13:16    Procedures   Medications Ordered in the ED - No data to display                                 Medical Decision Making Amount and/or Complexity of Data Reviewed Labs: ordered. Radiology: ordered.   This patient presents to the ED for concern of chest pain, this involves an extensive number of treatment options, and is a complaint that carries with it a high risk of complications and morbidity.   Differential diagnosis includes: Unstable angina, STEMI, NSTEMI, GERD, costochondritis  Co morbidities:  new onset   Lab Tests:  I Ordered, and personally interpreted labs.  The pertinent results include:    - Initial troponin: 24, repeat: 17  Imaging Studies:  I ordered imaging studies including chest x-ray I independently visualized and interpreted imaging which showed no acute cardiopulmonary abnormalities I agree with the radiologist interpretation  Cardiac Monitoring/ECG:  The patient was maintained on a cardiac monitor.  I personally viewed and interpreted the cardiac monitored which showed an underlying rhythm of: Sinus rhythm  Medicines ordered and prescription drug management: No medication is indicated at this time  Test Considered:   none  Critical Interventions:   none  Consultations Obtained: None  Problem List / ED Course:     ICD-10-CM   1. Precordial pain  R07.2       MDM: 78 year old female presents emergency department for evaluation of chest pain.  Chest pain began approximately 1 week ago and is nonradiating to the arm or the back.  Patient reported to the ED today, because the intensity has worsened.  Patient was taking some Tylenol at home for  the pain, last dose was 2 days ago.  Patient has no prior cardiac history and  most recent calcium score was 0.  She does have a history of new onset Alzheimer's, but no other pertinent history.  Her pain is not reproducible on physical exam.  Her EKG shows sinus rhythm with occasional PVCs.  Chest x-ray shows no active cardiopulmonary disease.  CBC and BMP grossly unremarkable.  Initial troponin is elevated at 24.  Delta troponin is 17.  Upon reevaluation, patient reports that she does have some discomfort in her chest.  At this time, my recommendation is for the patient to follow-up with her PCP for further workup and possible outpatient echo.  I educated the patient on the fact that this pain may be due to a pulled muscle and that she can take Tylenol or ibuprofen as needed for the pain.  I explained this to the patient's husband, who is at bedside.  Patient and her husband are both agreeable to this plan.  Patient's vital signs are stable.  Patient is appropriate for discharge at this time.     Dispostion:  After consideration of the diagnostic results and the patients response to treatment, I feel that the patient would benefit from supportive care and PCP follow-up for further workup.    Final diagnoses:  Precordial pain    ED Discharge Orders     None          Torrence Marry GORMAN DEVONNA 04/08/24 2217    Charlyn Sora, MD 04/13/24 1221

## 2024-04-08 NOTE — Discharge Instructions (Addendum)
 It was a pleasure taking care of you today. You were seen in the Emergency Department for evaluation of chest pain. Your work-up was reassuring. Your CT/Xray/Labs showed no acute abnormality to explain your symptoms. Refer to the attached documentation for further management of your symptoms.  I do recommend you follow-up with your PCP for further workup as well as a possible appointment with a cardiologist.  Please call your PCP as soon as possible to schedule an appointment within the next 1 to 2 weeks.  Please return to the ER if you experience chest pain, trouble breathing, intractable nausea/vomiting or any other life threatening illnesses.

## 2024-04-11 ENCOUNTER — Encounter: Payer: Self-pay | Admitting: Emergency Medicine

## 2024-04-11 ENCOUNTER — Ambulatory Visit: Payer: Self-pay

## 2024-04-11 ENCOUNTER — Ambulatory Visit

## 2024-04-11 ENCOUNTER — Ambulatory Visit: Admitting: Emergency Medicine

## 2024-04-11 VITALS — BP 116/64 | HR 70 | Temp 98.3°F | Ht 68.0 in | Wt 128.0 lb

## 2024-04-11 DIAGNOSIS — M810 Age-related osteoporosis without current pathological fracture: Secondary | ICD-10-CM

## 2024-04-11 DIAGNOSIS — M940 Chondrocostal junction syndrome [Tietze]: Secondary | ICD-10-CM | POA: Diagnosis not present

## 2024-04-11 DIAGNOSIS — Z09 Encounter for follow-up examination after completed treatment for conditions other than malignant neoplasm: Secondary | ICD-10-CM | POA: Diagnosis not present

## 2024-04-11 MED ORDER — ROMOSOZUMAB-AQQG 105 MG/1.17ML ~~LOC~~ SOSY
210.0000 mg | PREFILLED_SYRINGE | Freq: Once | SUBCUTANEOUS | Status: AC
  Administered 2024-05-25: 210 mg via SUBCUTANEOUS

## 2024-04-11 MED ORDER — ROMOSOZUMAB-AQQG 105 MG/1.17ML ~~LOC~~ SOSY
210.0000 mg | PREFILLED_SYRINGE | Freq: Once | SUBCUTANEOUS | Status: AC
  Administered 2024-04-11: 210 mg via SUBCUTANEOUS

## 2024-04-11 NOTE — Patient Instructions (Signed)
 Costochondritis  Costochondritis is irritation and swelling (inflammation) of the tissue that connects the ribs to the breastbone (sternum). This tissue is called cartilage. This condition causes pain in the front of the chest. The pain often starts slowly. It may be in more than one rib. What are the causes? The cause of this condition is not always known. It can come from stress on the sternum. The cause of this stress could be: Chest injury. Exercise or activity. This may include lifting. Very bad coughing. What increases the risk? Being female. Being 25-56 years old. Starting a new exercise or work activity. Having low levels of vitamin D. Having a condition that makes you cough a lot. What are the signs or symptoms? Chest pain that: Starts slowly. It can be sharp or dull. Gets worse with deep breathing, coughing, or exercise. Gets better with rest. May be worse when you press on your ribs and breastbone. How is this treated? In most cases, this condition goes away on its own over time. You may need to take an NSAID, such as ibuprofen. This can help reduce pain. You may also need to: Rest and stay away from activities that make pain worse. Put heat or ice on the area that hurts. Do exercises to stretch your chest muscles. If these treatments do not help, your doctor may inject a medicine to numb the area. This can help relieve the pain. Follow these instructions at home: Managing pain, stiffness, and swelling     If told, put ice on the painful area. To do this: Put ice in a plastic bag. Place a towel between your skin and the bag. Leave the ice on for 20 minutes, 2-3 times a day. If told, put heat on the affected area. Do this as often as told by your doctor. Use the heat source that your doctor recommends, such as a moist heat pack or a heating pad. Place a towel between your skin and the heat source. Leave the heat on for 20-30 minutes. If your skin turns bright red,  take off the ice or heat right away to prevent skin damage. The risk of skin damage is higher if you cannot feel pain, heat, or cold. Activity Rest as told by your doctor. Do not do things that make your pain worse. This includes activities that use your chest, belly (abdomen), and side muscles. You may have to avoid lifting. Ask your doctor how much you can safely lift. Return to your normal activities when your doctor says that it is safe. General instructions Take over-the-counter and prescription medicines only as told by your doctor. Contact a doctor if: You have chills or a fever. Your pain does not go away or gets worse. You have a cough that does not go away. Get help right away if: You have a hard time breathing. You have very bad chest pain that does not get better with medicines, heat, or ice. These symptoms may be an emergency. Get help right away. Call 911. Do not wait to see if the symptoms will go away. Do not drive yourself to the hospital. This information is not intended to replace advice given to you by your health care provider. Make sure you discuss any questions you have with your health care provider. Document Revised: 11/14/2021 Document Reviewed: 11/14/2021 Elsevier Patient Education  2024 [employer].

## 2024-04-11 NOTE — Progress Notes (Cosign Needed Addendum)
 After obtaining consent, and per orders of Dr. Garald, injection of Evenity  given by Ronnald SHAUNNA Palms. Patient instructed to report any adverse reaction to me immediately.   Medical screening examination/treatment/procedure(s) were performed by non-physician practitioner and as supervising physician I was immediately available for consultation/collaboration.  I agree with above. Karlynn Garald, MD

## 2024-04-11 NOTE — Progress Notes (Signed)
 Wendy Serrano 78 y.o.   Chief Complaint  Patient presents with   Follow-up    left upper breast/chest discomfort only when applying pressure to the are    HISTORY OF PRESENT ILLNESS: This is a 78 y.o. female here for follow-up of emergency department visit on 04/08/2024 when she presented with left-sided chest pain Negative cardiac workup Felt to have costochondritis Here accompanied by husband Feeling better today No other complaints or medical concerns today.  HPI   Prior to Admission medications   Medication Sig Start Date End Date Taking? Authorizing Provider  amantadine (SYMMETREL) 100 MG capsule Take 100 mg by mouth.   Yes [provider]  aspirin 81 MG chewable tablet Chew 81 mg by mouth daily.   Yes [provider]  Difluprednate 0.05 % EMUL Apply 1 drop to eye. 03/21/22  Yes [provider]  donepezil  (ARICEPT ) 10 MG tablet Take 10 mg by mouth.   Yes [provider]  Dupilumab  (DUPIXENT ) 300 MG/2ML SOAJ Inject 300 mg into the skin every 14 (fourteen) days. Starting at day 15 for maintenance. Patient taking differently: Inject 300 mg into the skin every 14 (fourteen) days. Starting at day 15 for maintenance. Has not started 02/29/24  Yes Alm Delon SAILOR, DO  glucosamine-chondroitin 500-400 MG tablet Take 1 tablet by mouth 2 (two) times daily.   Yes [provider]  Magnesium 400 MG TABS Take 1 tablet by mouth daily.   Yes [provider]  memantine  (NAMENDA ) 10 MG tablet Take 1 tablet (10 mg total) by mouth 2 (two) times daily. 03/29/24  Yes Dohmeier, Dedra, MD  methotrexate [facility]) 2.5 MG tablet 6 tablets on Saturday 08/09/15  Yes [provider]  OPZELURA  1.5 % CREA Apply to affected areas of the body BID. 03/08/24  Yes Alm Delon SAILOR, DO  pregabalin  (LYRICA ) 25 MG capsule TAKE 1 CAPSULE BY MOUTH TWICE A DAY 11/12/23  Yes Plotnikov, Karlynn GAILS, MD  rivastigmine  (EXELON ) 9.5 mg/24hr Place 1 patch (9.5  mg total) onto the skin daily. 11/23/23  Yes Dohmeier, Dedra, MD  Safety Seal Miscellaneous MISC Apply 1 Application topically at bedtime. Medication Name: Soft Heel Cream (Urea 40%, Salicylic Acid 12%) 10/12/23  Yes Alm Delon SAILOR, DO  SIMBRINZA 1-0.2 % SUSP Place 1 drop into both eyes 2 (two) times daily. 07/29/15  Yes [provider]  SODIUM FLUORIDE 5000 PPM 1.1 % PSTE as directed. 06/11/23  Yes [provider]  terbinafine  (LAMISIL ) 250 MG tablet Take 250 mg by mouth daily. 02/03/24  Yes [provider]  UNABLE TO FIND Take 2 capsules by mouth daily. Med Name: [facility] mane   Yes [provider]  UNABLE TO FIND Take 2 capsules by mouth daily. Med Name: Kyolic- for cholesterol   Yes [provider]  Vitamin D -Vitamin K (VITAMIN K2-VITAMIN D3 PO) Take by mouth.   Yes [provider]  clobetasol  ointment (TEMOVATE ) 0.05 % Apply 1 Application topically 2 (two) times daily. Apply 2 times daily for 2 weeks then STOP and take a break for 2 weeks. While taking a 2 week break apply Pea sized amount of Opzelura  2 times Daily. Patient not taking: Reported on 04/11/2024 02/18/24   Alm Delon SAILOR, DO  cyclobenzaprine  (FLEXERIL ) 5 MG tablet Take 5 mg by mouth. Patient not taking: Reported on 04/11/2024 06/09/23   [provider]  Donanemab-azbt  (KISUNLA ) 350 MG/20ML SOLN Inject 700 mg into the vein every 30 (thirty) days. After third infusion ,the dose  will be 1400 mg Patient not taking: Reported on 04/11/2024 11/23/23   Dohmeier, Dedra, MD  famotidine  (PEPCID ) 40 MG tablet Take 40 mg by mouth. Patient not taking: Reported on 04/11/2024    [provider]  FOLIC ACID PO Take 4 tablets by mouth daily. Patient not taking: Reported on 04/11/2024    [provider]  gabapentin  (NEURONTIN ) 100 MG capsule Take 100 mg by mouth. Patient not taking: Reported on 04/11/2024    [provider]  ketoconazole  (NIZORAL ) 2 % cream APPLY TO  AFFECTED AREA TWICE A DAY Patient not taking: Reported on 04/11/2024 07/13/23   Alm Delon SAILOR, DO  prednisoLONE acetate (PRED FORTE) 1 % ophthalmic suspension PLACE 1 DROP INTO BOTH EYES ONCE DAILY Patient not taking: Reported on 04/11/2024    [provider]  predniSONE (DELTASONE) 10 MG tablet Take 6 tablets po x1 day, 5 tablets po x 1 day, 4 tablets po x 1 day, 3 tablets po x 1 day, 2 tablets po x 1 day, 1 tablet po x 1 day Patient not taking: Reported on 04/11/2024 09/23/23   [provider]  tacrolimus  (PROTOPIC ) 0.1 % ointment Apply topically 2 (two) times daily. Apply 2 x daily to palms for 2 weeks then stop. Alternate with Clotrimazole /Betamethasone  cream Patient not taking: Reported on 04/11/2024 09/25/22   Alm Delon SAILOR, DO  traMADol  (ULTRAM ) 50 MG tablet Take 1 tablet (50 mg total) by mouth every 6 (six) hours as needed. 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   Cervical radiculopathy 07/04/2023   Upper back pain on left side 06/09/2023   Hand dermatitis 06/09/2023   Late onset Alzheimer's dementia without behavioral disturbance, psychotic disturbance, mood disturbance, or anxiety (HCC) 11/26/2022   Abnormal serum thyroxine (T4) level 07/25/2022   Rash 07/09/2022   Abnormal cervical Papanicolaou smear 04/14/2022   History of dysmenorrhea 04/14/2022   Leg wound, right 04/14/2022   Pseudophakia of both eyes 03/28/2022   MCI (mild cognitive impairment) 03/13/2022   Foot pain, right 03/06/2022   Vascular insufficiency of limb 02/03/2022   Cramps, muscle, general 11/04/2021    Atrophic vaginitis 09/04/2021   Menopausal symptom 09/04/2021   At low risk for open-angle glaucoma in both eyes 07/19/2021   Pain of left lower extremity 07/04/2021   Vitamin D  deficiency 06/30/2021   Dysesthesia of scalp 04/29/2021   History of COVID-19 04/29/2021   Neuralgia involving scalp 03/28/2021   Headache 03/12/2021   Ear canal abrasion, right, initial encounter 03/01/2021   GERD (gastroesophageal reflux disease) 01/24/2021   Leg cramps 01/24/2021   Atherosclerosis of aorta 10/11/2020   Foot pain, bilateral 05/14/2020   Neck pain 05/11/2020   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 04/20/2020   Asymptomatic varicose veins of both lower extremities 07/07/2019   Hyperpigmentation 07/06/2019   Rhytides 07/06/2019   Mild cognitive disorder 06/16/2019   Dementia (HCC) 02/14/2019   Ear pain, left 08/12/2018   Sensorineural hearing loss (SNHL), bilateral 01/04/2018   Vertigo 11/19/2017   CME (cystoid macular edema), right 08/28/2017   Stress fracture of metatarsal bone of left foot 07/31/2017  Uveitis 06/06/2016   Nuclear sclerosis, bilateral 04/08/2016   Knee pain, bilateral 08/23/2015   High risk medication use 07/24/2015   Puckering of macula, left eye 07/24/2015   Drusen (degenerative) of macula, bilateral 12/15/2014   Iridocyclitis, 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   Nuclear cataract of both eyes 11/15/2014   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/15/2014   Steroid responder, bilateral 11/15/2014   Tinnitus 05/24/2013   Abdominal pain 04/20/2013   Dense breast tissue 12/10/2012   Elevated serum cholesterol 12/25/2011   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   Menopausal symptoms    Renal cyst    bilateral   Rotator cuff tear    Yeast infection     Past Surgical History:  Procedure Laterality Date   ROTATOR CUFF REPAIR Left 08/2009   Left shoulder-rotator cuff repair [place])   WISDOM TOOTH EXTRACTION      Social History   Socioeconomic History   Marital status: Married    Spouse name: [name]   Number of children: 0   Years of education: college   Highest education level: Master's degree (e.g., MA, MS, MEng, MEd, MSW, MBA)  Occupational History   Occupation: Retired  Tobacco Use   Smoking status: Never   Smokeless tobacco: Never  Vaping Use   Vaping status: Never Used  Substance and Sexual Activity   Alcohol use: No   Drug use: No   Sexual activity: Never    Birth control/protection: Post-menopausal  Other Topics Concern   Not on file  Social History Narrative   [employer] college BA; MEd-teaching;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 They are doing well   Marriage is in good health and retirement is goodEnd-of-life: provided packet of information and forms for consideration (Oct'11)      Lives with husband   Right handed   Caffeine: zero   Social Drivers of Health   Financial Resource Strain: Low Risk  (02/02/2024)   Received from [facility]   Overall Financial Resource Strain (CARDIA)    Difficulty of Paying Living Expenses: Not hard at all  Food Insecurity: No Food Insecurity (02/02/2024)    [facility] from [facility]   Hunger Vital Sign    Within the past 12 months, you worried that your food would run out before you got the money to buy more.: Never true    Within the past 12 months, the food you bought just didn't last and you didn't have money to get more.: Never true  Transportation Needs: No Transportation Needs (02/02/2024)   Received from [facility]   [facility] - Transportation  In the past 12 months, has lack of transportation kept you from medical appointments or from getting medications?: No    Lack of Transportation (Non-Medical): No  Physical Activity: Sufficiently Active (09/08/2023)   Exercise Vital Sign    Days of Exercise per Week: 3 days    Minutes of Exercise per Session: 60 min  Stress: No Stress Concern Present (09/08/2023)   [employer] of Occupational Health - Occupational Stress Questionnaire    Feeling of Stress : Only a little  Social Connections: Socially Integrated (09/08/2023)   Social Connection and Isolation Panel    Frequency of Communication with Friends and Family: More than three times a week    Frequency of Social Gatherings with Friends and Family: Twice a week    Attends Religious Services: More than 4 times per year    Active Member of [employer] or Organizations: Yes    Attends Banker Meetings: Never    Marital Status: Married  Catering Manager Violence: Not At Risk (09/08/2023)   Humiliation, Afraid, Rape, and Kick questionnaire    Fear of Current or Ex-Partner: No    Emotionally Abused: No    Physically Abused: No    Sexually Abused: No    Family History  Problem Relation Age of Onset   Prostate cancer Father        died   Pancreatic cancer Mother        died   Diabetes Mother    Hyperlipidemia Sister      Review of Systems  Constitutional: Negative.  Negative for chills and fever.  HENT: Negative.  Negative for congestion and sore throat.   Respiratory: Negative.   Negative for cough.   Cardiovascular: Negative.  Negative for chest pain and palpitations.  Gastrointestinal:  Negative for abdominal pain, diarrhea, nausea and vomiting.  Genitourinary: Negative.  Negative for dysuria and hematuria.  Skin: Negative.  Negative for rash.  Neurological: Negative.  Negative for dizziness and headaches.  All other systems reviewed and are negative.   Vitals:   04/11/24 1516  BP: 116/64  Pulse: 70  Temp: 98.3 F (36.8 C)  SpO2: 99%    Physical Exam Vitals reviewed.  Constitutional:      Appearance: Normal appearance.  HENT:     Head: Normocephalic.  Eyes:     Extraocular Movements: Extraocular movements intact.     Pupils: Pupils are equal, round, and reactive to light.  Cardiovascular:     Rate and Rhythm: Normal rate and regular rhythm.     Pulses: Normal pulses.     Heart sounds: Normal heart sounds.  Pulmonary:     Effort: Pulmonary effort is normal.     Breath sounds: Normal breath sounds.  Musculoskeletal:     Cervical back: No tenderness.  Lymphadenopathy:     Cervical: No cervical adenopathy.  Skin:    General: Skin is warm and dry.     Capillary Refill: Capillary refill takes less than 2 seconds.  Neurological:     General: No focal deficit present.     Mental Status: She is alert and oriented to person, place, and time.  Psychiatric:        Mood and Affect: Mood normal.        Behavior: Behavior normal.    EKG: Sinus bradycardia with ventricular rate of 56/min.  No acute ischemic changes.  ASSESSMENT & PLAN: Problem List Items Addressed This Visit       Musculoskeletal and Integument   Costochondritis -  Primary   Clinically stable.  No red flag signs or symptoms. Asymptomatic today. No pain on examination Normal EKG. No concerns identified today. Pain management discussed Advised to follow-up with PCP      Other Visit Diagnoses       Hospital discharge follow-up          Patient Instructions   Costochondritis  Costochondritis is irritation and swelling (inflammation) of the tissue that connects the ribs to the breastbone (sternum). This tissue is called cartilage. This condition causes pain in the front of the chest. The pain often starts slowly. It may be in more than one rib. What are the causes? The cause of this condition is not always known. It can come from stress on the sternum. The cause of this stress could be: Chest injury. Exercise or activity. This may include lifting. Very bad coughing. What increases the risk? Being female. Being 39-40 years old. Starting a new exercise or work activity. Having low levels of vitamin D . Having a condition that makes you cough a lot. What are the signs or symptoms? Chest pain that: Starts slowly. It can be sharp or dull. Gets worse with deep breathing, coughing, or exercise. Gets better with rest. May be worse when you press on your ribs and breastbone. How is this treated? In most cases, this condition goes away on its own over time. You may need to take an NSAID, such as ibuprofen. This can help reduce pain. You may also need to: Rest and stay away from activities that make pain worse. Put heat or ice on the area that hurts. Do exercises to stretch your chest muscles. If these treatments do not help, your doctor may inject a medicine to numb the area. This can help relieve the pain. Follow these instructions at home: Managing pain, stiffness, and swelling     If told, put ice on the painful area. To do this: Put ice in a plastic bag. Place a towel between your skin and the bag. Leave the ice on for 20 minutes, 2-3 times a day. If told, put heat on the affected area. Do this as often as told by your doctor. Use the heat source that your doctor recommends, such as a moist heat pack or a heating pad. Place a towel between your skin and the heat source. Leave the heat on for 20-30 minutes. If your skin turns bright red,  take off the ice or heat right away to prevent skin damage. The risk of skin damage is higher if you cannot feel pain, heat, or cold. Activity Rest as told by your doctor. Do not do things that make your pain worse. This includes activities that use your chest, belly (abdomen), and side muscles. You may have to avoid lifting. Ask your doctor how much you can safely lift. Return to your normal activities when your doctor says that it is safe. General instructions Take over-the-counter and prescription medicines only as told by your doctor. Contact a doctor if: You have chills or a fever. Your pain does not go away or gets worse. You have a cough that does not go away. Get help right away if: You have a hard time breathing. You have very bad chest pain that does not get better with medicines, heat, or ice. These symptoms may be an emergency. Get help right away. Call 911. Do not wait to see if the symptoms will go away. Do not drive yourself to the hospital. This information is not  intended to replace advice given to you by your health care provider. Make sure you discuss any questions you have with your health care provider. Document Revised: 11/14/2021 Document Reviewed: 11/14/2021 Elsevier Patient Education  2024 Elsevier Inc.    Emil Schaumann, MD [facility]

## 2024-04-11 NOTE — Assessment & Plan Note (Signed)
 Clinically stable.  No red flag signs or symptoms. Asymptomatic today. No pain on examination Normal EKG. No concerns identified today. Pain management discussed Advised to follow-up with PCP

## 2024-04-11 NOTE — Telephone Encounter (Signed)
 FYI Only or Action Required?: FYI only for provider: appointment scheduled on 04/11/24.  Patient was last seen in primary care on 03/28/2024 by Richad Jon HERO, NP.  Called Nurse Triage reporting Chest Pain.  Symptoms began several days ago.  Interventions attempted: Nothing.  Symptoms are: left upper chest/breast pain when applying pressure, only lasts while applying pressure stable.  Triage Disposition: See PCP When Office is Open (Within 3 Days)  Patient/caregiver understands and will follow disposition?: Yes            Copied from CRM #[id number]. Topic: Clinical - Red Word Triage >> Apr 11, 2024  9:00 AM Fonda T wrote: [facility] that prompted transfer to Nurse Triage: Pt spouse, [name], calling on behalf of pt, states she is having chest discomfort/pain, was sen in hospital on Friday, but symptoms have returned.  Pt has a clinical appt with nurses today, and would like to know if she can see provider at that time for evaluation. Reason for Disposition  [1] Chest pain(s) lasting a few seconds AND [2] persists > 3 days  Answer Assessment - Initial Assessment Questions 1. LOCATION: Where does it hurt?       Left breast just above it.  2. RADIATION: Does the pain go anywhere else? (e.g., into neck, jaw, arms, back)     No.  3. ONSET: When did the chest pain begin? (Minutes, hours or days)      Wednesday.  4. PATTERN: Does the pain come and go, or has it been constant since it started?  Does it get worse with exertion?      Comes and goes, patient only complains of pain when applying pressure to the area.  5. DURATION: How long does it last (e.g., seconds, minutes, hours)     Unsure, he states it is a discomfort. She is asleep and has not complained, husband woke her up and she is not in pain, only painful when he applied pressure to the area.  6. SEVERITY: How bad is the pain?  (e.g., Scale 1-10; mild, moderate, or severe)     Discomfort, 2-3/10 the other  day.   7. CARDIAC RISK FACTORS: Do you have any history of heart problems or risk factors for heart disease? (e.g., angina, prior heart attack; diabetes, high blood pressure, high cholesterol, smoker, or strong family history of heart disease)     No.  8. PULMONARY RISK FACTORS: Do you have any history of lung disease?  (e.g., blood clots in lung, asthma, emphysema, birth control pills)     No.  9. CAUSE: What do you think is causing the chest pain?     Unsure, he thinks it might be a muscle or she might have overdone it when preparing the house and food for Thanksgiving. Denies any known injury.  10. OTHER SYMPTOMS: Do you have any other symptoms? (e.g., dizziness, nausea, vomiting, sweating, fever, difficulty breathing, cough)       No.  Protocols used: Chest Pain-A-AH

## 2024-04-12 ENCOUNTER — Inpatient Hospital Stay: Admitting: Internal Medicine

## 2024-04-12 ENCOUNTER — Other Ambulatory Visit (HOSPITAL_COMMUNITY): Payer: Self-pay

## 2024-04-12 NOTE — Addendum Note (Signed)
 Addended by: ROSALVA LEX RAMAN on: 04/12/2024 02:08 PM   Modules accepted: Orders

## 2024-04-27 ENCOUNTER — Other Ambulatory Visit: Payer: Self-pay

## 2024-04-27 MED ORDER — MEMANTINE HCL 10 MG PO TABS: 10.0000 mg | ORAL_TABLET | Freq: Two times a day (BID) | ORAL | 2 refills | Status: AC

## 2024-04-27 NOTE — Telephone Encounter (Signed)
 Last refilled by patient on : 04/04/2024 Last office visit :  03/29/24 Next office visit : 06/29/24 Per last office visit - on Rivastigmin patches. No refill for the patches needed but for Namenda .

## 2024-05-06 ENCOUNTER — Telehealth: Payer: Self-pay

## 2024-05-06 NOTE — Telephone Encounter (Signed)
 Pt has requested a NEW PA be sent in for Evenity  injec to check what her coverage will be for 2026 as she is due for her next injec in Januray.

## 2024-05-09 ENCOUNTER — Telehealth: Payer: Self-pay

## 2024-05-09 NOTE — Telephone Encounter (Signed)
 I have submitted benefits in [employer]. Per [employer], Scheduled blackout dates: 05/12/2024 - 05/20/2024. I'm not sure how long it will take [employer] to verify benefits or if benefits will be checked before the 10th. I will update referral as soon as possible.

## 2024-05-09 NOTE — Telephone Encounter (Signed)
 Evenity  VOB initiated via [email or web address]  Last Evenity  inj: 04/11/24 Next Evenity  inj DUE:  05/12/24   *Per [employer] website, Scheduled blackout dates: 05/12/2024 - 05/20/2024.*

## 2024-05-16 ENCOUNTER — Other Ambulatory Visit: Payer: Self-pay | Admitting: Internal Medicine

## 2024-05-16 ENCOUNTER — Telehealth: Payer: Self-pay

## 2024-05-16 NOTE — Telephone Encounter (Signed)
 Copied from CRM #[id number]. Topic: Appointments - Appointment Info/Confirmation >> May 06, 2024 10:46 AM Chasity T wrote: Patient husband is calling with concerns on her monthly injection for Romosozumab -aqqg (EVENITY ). reached out to [employer] for clarification. Advised that office is waiting on approval from insurance to schedule the appointment. Husband states he will reach out to them even though they received a letter in the mail stating insurance will cover the injection. >> May 16, 2024 10:58 AM Jeoffrey H wrote:   [name] (patients husband) called to get the status of patients monthly injection for Romosozumab -aqqg (EVENITY ). I advised him that the pharmacist sent over the benefits to the system and that she would reach out once everything was processed. Also wanted to know if it was time for her to get off the injection. Please give [name] a call.    [name]- [phone]

## 2024-05-17 NOTE — Telephone Encounter (Signed)
 Evenity  is 12 months total - please see me after the last shot to discuss other options.  Thanks

## 2024-05-18 ENCOUNTER — Telehealth: Payer: Self-pay | Admitting: Internal Medicine

## 2024-05-18 NOTE — Telephone Encounter (Signed)
 Copied from CRM [id number]. Topic: General - Call Back - No Documentation >> May 18, 2024  3:02 PM Rea C wrote: Reason for CRM: Patient called back. Relayed note from Zack but patient has further questions. Wendy Serrano stated that they didn't get the January shot and is asking how will this impact the patient?   [phone] (M)

## 2024-05-18 NOTE — Telephone Encounter (Signed)
 Attempted to call patient/patient's spouse to inform them of PCP response, as well as status of PA. From last message regarding prior authorization, they are currently in a blackout period for the beginning of the year. Because of this the portal they would use to test authorization is down until at least 01/09

## 2024-05-20 ENCOUNTER — Other Ambulatory Visit (HOSPITAL_COMMUNITY): Payer: Self-pay

## 2024-05-20 NOTE — Telephone Encounter (Signed)
 SABRA

## 2024-05-20 NOTE — Telephone Encounter (Addendum)
 MEDICAL PA SUBMITTED VIA LATENT. KEY: [id number]   APPROVED

## 2024-05-20 NOTE — Telephone Encounter (Signed)
 Pt ready for scheduling for EVENITY  on or after : 05/20/24  Option# 1 Buy/Bill (Office supplied medication)  Out-of-pocket cost due at time of  office visit: $0  Number of injection/visits approved: 12  Primary: [employer] Evenity  co-insurance: 0% Admin fee co-insurance: 0%  Secondary: --- Evenity  co-insurance:  Admin fee co-insurance:   Medical Benefit Details: Date Benefits were checked: 05/19/24 Deductible: NO/ Coinsurance: 0%/ Admin Fee: 0%  Prior Auth: APPROVED PA# [id number] Expiration Date: 06/29/23-05/11/25   # of doses approved: 12 ------------------------------------------------------------------------- Option# 2- Med Obtained from pharmacy  Pharmacy benefit: Copay $--- (Paid to pharmacy) Admin Fee: --- (Pay at clinic)  Prior Auth: --- PA# Expiration Date:   # of doses approved:  If patient wants fill through the pharmacy benefit please send prescription to: ---, and include estimated need by date in rx notes. Pharmacy will ship medication directly to the office.  Patient NOT eligible for Evenity  Copay Card. Copay Card can make patient's cost as little as $25. Link to apply: [email or web address]://[email or web address].[email or web address]/copay   This summary of benefits is an estimation of the patient's out-of-pocket cost. Exact cost may very based on individual plan coverage.

## 2024-05-25 ENCOUNTER — Ambulatory Visit

## 2024-05-25 DIAGNOSIS — M810 Age-related osteoporosis without current pathological fracture: Secondary | ICD-10-CM | POA: Diagnosis not present

## 2024-05-25 MED ORDER — ROMOSOZUMAB-AQQG 105 MG/1.17ML ~~LOC~~ SOSY: 210.0000 mg | PREFILLED_SYRINGE | SUBCUTANEOUS | Status: AC

## 2024-05-25 NOTE — Telephone Encounter (Signed)
 Why did they miss the shot?  Thank you

## 2024-05-25 NOTE — Progress Notes (Addendum)
 Pt was given Evenity  injection w/o any complications at this time.  Medical screening examination/treatment/procedure(s) were performed by non-physician practitioner and as supervising physician I was immediately available for consultation/collaboration.  I agree with above. Karlynn Noel, MD

## 2024-05-25 NOTE — Telephone Encounter (Signed)
 I was able to speak with the pts husband and he will be coming in today with the pt as she is approved for 12 evenity  visits for this year with $0 co-pay.

## 2024-06-09 ENCOUNTER — Ambulatory Visit: Payer: Self-pay

## 2024-06-09 NOTE — Telephone Encounter (Signed)
 FYI Only or Action Required?: FYI only for provider: appointment scheduled on 06/10/24.  Patient was last seen in primary care on 04/11/2024 by Purcell Emil Schanz, MD.  Called Nurse Triage reporting Abdominal Pain.  Symptoms began several months ago.  Interventions attempted: Nothing.  Symptoms are: stable.  Triage Disposition: See Physician Within 24 Hours  Patient/caregiver understands and will follow disposition?: Yes   Reason for Disposition  [1] MODERATE pain (e.g., interferes with normal activities) AND [2] pain comes and goes (cramps) AND [3] present > 24 hours  (Exception: Pain with Vomiting or Diarrhea - see that Guideline.)  Answer Assessment - Initial Assessment Questions Pt's husband [name] calling in today.   1. LOCATION: Where does it hurt?      Just points to diaphragm or esophageus   2. RADIATION: Does the pain shoot anywhere else? (e.g., chest, back)     Denies  3. ONSET: When did the pain begin? (e.g., minutes, hours or days ago)      On going, but comes and goes, started around 3-4 days ago  5. PATTERN Does the pain come and go, or is it constant?     Comes and goes  6. RECURRENT SYMPTOM: Have you ever had this type of stomach pain before? If Yes, ask: When was the last time? and What happened that time?      Yes, was seen in ED, appears to be for chest pain, then evaluated in PCP office and dx with costochondritis.   7. CAUSE: What do you think is causing the stomach pain? (e.g., gallstones, recent abdominal surgery)     Unsure  8. OTHER SYMPTOMS: Do you have any other symptoms? (e.g., back pain, diarrhea, fever, urination pain, vomiting)       Denies  Protocols used: Abdominal Pain - Female-A-AH  Message from Deaijah H sent at 06/09/2024  2:05 PM EST  Reason for Triage: Discomfort abdominal pain. Requesting referral to [facility].

## 2024-06-10 ENCOUNTER — Ambulatory Visit: Admitting: Internal Medicine

## 2024-06-10 VITALS — BP 130/72 | HR 71 | Temp 98.2°F | Ht 68.0 in | Wt 130.4 lb

## 2024-06-10 DIAGNOSIS — K219 Gastro-esophageal reflux disease without esophagitis: Secondary | ICD-10-CM | POA: Diagnosis not present

## 2024-06-10 DIAGNOSIS — K5904 Chronic idiopathic constipation: Secondary | ICD-10-CM | POA: Diagnosis not present

## 2024-06-10 DIAGNOSIS — R109 Unspecified abdominal pain: Secondary | ICD-10-CM | POA: Diagnosis not present

## 2024-06-10 NOTE — Progress Notes (Signed)
 "  Subjective:   Patient ID: Wendy Serrano, female    DOB: 02-Dec-1945, 79 y.o.   MRN: [id number]  Discussed the use of AI scribe software for clinical note transcription with the patient, who gave verbal consent to proceed.  History of Present Illness Wendy Serrano is a 79 year old female who presents with recurrent abdominal discomfort. She is accompanied by her husband who helps to provide history due to dementia.  She has been experiencing abdominal discomfort two episodes. One about two to three months ago, which led to an emergency room visit in November and lasted less than 1 week. She was prescribed Miralax and Doryx , which initially resolved her symptoms.  The discomfort now started two to three days ago, described as intermittent rather than constant pain. She attempted the same medication regimen, but it was less effective this time. Currently, she reports no discomfort at all today.   Her medication history includes the use of Pepcid  (famotidine ) for acid reflux, though it is unclear if she is currently taking it. She has also used Tums and omeprazole 40 mg for this discomfort which helped.  She has not had a colonoscopy since 2014, which was clear at that time. She does not report regular constipation issues, though she acknowledges not drinking enough fluids, which her husband believes contributes to occasional constipation.  No recent changes in appetite, though she eats very little and does not drink enough water. No blood in stool or dark stools. No recent breathing difficulties or persistent cough.  Review of Systems  Unable to perform ROS: Dementia  Constitutional: Negative.   HENT: Negative.    Eyes: Negative.   Respiratory:  Negative for cough, chest tightness and shortness of breath.   Cardiovascular:  Negative for chest pain, palpitations and leg swelling.  Gastrointestinal:  Positive for abdominal pain. Negative for abdominal distention, constipation, diarrhea,  nausea and vomiting.    Objective:  Physical Exam Constitutional:      Appearance: She is well-developed.  HENT:     Head: Normocephalic and atraumatic.  Cardiovascular:     Rate and Rhythm: Normal rate and regular rhythm.  Pulmonary:     Effort: Pulmonary effort is normal. No respiratory distress.     Breath sounds: Normal breath sounds. No wheezing or rales.  Abdominal:     General: Bowel sounds are normal. There is no distension.     Palpations: Abdomen is soft.     Tenderness: There is no abdominal tenderness.  Musculoskeletal:     Cervical back: Normal range of motion.  Skin:    General: Skin is warm and dry.  Neurological:     Mental Status: She is alert and oriented to person, place, and time.     Coordination: Coordination normal.     Vitals:   06/10/24 1149  BP: 130/72  Pulse: 71  Temp: 98.2 F (36.8 C)  TempSrc: Oral  SpO2: 99%  Weight: 130 lb 6.4 oz (59.1 kg)  Height: 5' 8 (1.727 m)    Assessment and Plan Assessment & Plan Recurrent abdominal discomfort likely due to acid reflux   She experiences recurrent abdominal discomfort with intermittent symptoms and a recent exacerbation. Previous treatments were ineffective, but Tums provided some relief, indicating acid reflux. Consider famotidine  (Pepcid ) 20 mg or 40 mg daily as needed. Increase fluid intake. Use Tums or Maalox for immediate relief.  Intermittent constipation   Intermittent constipation may contribute to her abdominal discomfort. Adequate fluid intake is important. Consider Dulcolax daily  for regular bowel movements.   "

## 2024-06-10 NOTE — Patient Instructions (Signed)
 Check if she is taking famotidine  (pepcid ). If so I would continue this. If not I would consider to start taking this for a few weeks.

## 2024-06-12 ENCOUNTER — Other Ambulatory Visit: Payer: Self-pay | Admitting: Dermatology

## 2024-06-12 DIAGNOSIS — L209 Atopic dermatitis, unspecified: Secondary | ICD-10-CM

## 2024-06-27 ENCOUNTER — Ambulatory Visit

## 2024-06-29 ENCOUNTER — Ambulatory Visit: Admitting: Neurology

## 2024-09-01 ENCOUNTER — Ambulatory Visit: Admitting: Dermatology

## 2024-09-08 ENCOUNTER — Ambulatory Visit
